# Patient Record
Sex: Female | Born: 1945
Health system: Southern US, Community
[De-identification: ages and names within clinical notes are randomized; demographics above are authoritative.]

## PROBLEM LIST (undated history)

## (undated) DIAGNOSIS — E119 Type 2 diabetes mellitus without complications: Secondary | ICD-10-CM

## (undated) DIAGNOSIS — I2510 Atherosclerotic heart disease of native coronary artery without angina pectoris: Secondary | ICD-10-CM

## (undated) DIAGNOSIS — I10 Essential (primary) hypertension: Secondary | ICD-10-CM

## (undated) DIAGNOSIS — M1990 Unspecified osteoarthritis, unspecified site: Secondary | ICD-10-CM

## (undated) DIAGNOSIS — E785 Hyperlipidemia, unspecified: Secondary | ICD-10-CM

## (undated) DIAGNOSIS — C801 Malignant (primary) neoplasm, unspecified: Secondary | ICD-10-CM

## (undated) DIAGNOSIS — J189 Pneumonia, unspecified organism: Secondary | ICD-10-CM

## (undated) DIAGNOSIS — I219 Acute myocardial infarction, unspecified: Secondary | ICD-10-CM

## (undated) HISTORY — PX: CORONARY ANGIOPLASTY: SHX604

## (undated) HISTORY — DX: Acute myocardial infarction, unspecified: I21.9

## (undated) HISTORY — DX: Malignant (primary) neoplasm, unspecified: C80.1

## (undated) HISTORY — PX: CARDIAC CATHETERIZATION: SHX172

## (undated) HISTORY — DX: Type 2 diabetes mellitus without complications: E11.9

---

## 1967-01-19 HISTORY — PX: WRIST SURGERY: SHX841

## 2005-09-21 ENCOUNTER — Ambulatory Visit (HOSPITAL_COMMUNITY): Admission: RE | Admit: 2005-09-21 | Discharge: 2005-09-22 | Payer: Self-pay | Admitting: Orthopedic Surgery

## 2008-01-31 ENCOUNTER — Ambulatory Visit: Payer: Self-pay | Admitting: Family Medicine

## 2008-05-09 ENCOUNTER — Ambulatory Visit: Payer: Self-pay | Admitting: Nurse Practitioner

## 2009-04-01 ENCOUNTER — Ambulatory Visit: Payer: Self-pay | Admitting: Orthopedic Surgery

## 2009-04-03 ENCOUNTER — Ambulatory Visit: Payer: Self-pay | Admitting: Orthopedic Surgery

## 2009-10-02 ENCOUNTER — Ambulatory Visit: Payer: Self-pay | Admitting: Family Medicine

## 2010-01-18 HISTORY — PX: SHOULDER OPEN ROTATOR CUFF REPAIR: SHX2407

## 2010-10-06 ENCOUNTER — Ambulatory Visit: Payer: Self-pay

## 2010-10-24 ENCOUNTER — Ambulatory Visit: Payer: Self-pay | Admitting: Internal Medicine

## 2011-12-18 ENCOUNTER — Ambulatory Visit: Payer: Self-pay | Admitting: Medical

## 2013-04-12 ENCOUNTER — Ambulatory Visit: Payer: Self-pay

## 2013-04-12 LAB — URINALYSIS, COMPLETE
GLUCOSE, UR: NEGATIVE mg/dL (ref 0–75)
KETONE: NEGATIVE
Leukocyte Esterase: NEGATIVE
Nitrite: NEGATIVE
Ph: 5 (ref 4.5–8.0)
Specific Gravity: 1.025 (ref 1.003–1.030)

## 2013-04-18 DIAGNOSIS — C801 Malignant (primary) neoplasm, unspecified: Secondary | ICD-10-CM

## 2013-04-18 HISTORY — DX: Malignant (primary) neoplasm, unspecified: C80.1

## 2013-05-09 ENCOUNTER — Ambulatory Visit: Payer: Self-pay | Admitting: Family Medicine

## 2013-05-09 LAB — BASIC METABOLIC PANEL
Anion Gap: 6 — ABNORMAL LOW (ref 7–16)
BUN: 16 mg/dL (ref 7–18)
CO2: 30 mmol/L (ref 21–32)
Calcium, Total: 9.4 mg/dL (ref 8.5–10.1)
Chloride: 102 mmol/L (ref 98–107)
Creatinine: 1.02 mg/dL (ref 0.60–1.30)
GFR CALC NON AF AMER: 56 — AB
Glucose: 100 mg/dL — ABNORMAL HIGH (ref 65–99)
OSMOLALITY: 277 (ref 275–301)
Potassium: 4.6 mmol/L (ref 3.5–5.1)
Sodium: 138 mmol/L (ref 136–145)

## 2013-05-10 ENCOUNTER — Ambulatory Visit: Payer: Self-pay | Admitting: Family Medicine

## 2013-05-11 ENCOUNTER — Ambulatory Visit: Payer: Self-pay | Admitting: Family Medicine

## 2013-05-14 LAB — PATHOLOGY REPORT

## 2013-05-18 ENCOUNTER — Ambulatory Visit: Payer: Self-pay | Admitting: Oncology

## 2013-05-18 ENCOUNTER — Ambulatory Visit: Payer: Self-pay | Admitting: Family Medicine

## 2013-05-18 LAB — CBC CANCER CENTER
Basophil #: 0.1 x10 3/mm (ref 0.0–0.1)
Basophil %: 1.3 %
EOS ABS: 0.1 x10 3/mm (ref 0.0–0.7)
Eosinophil %: 1.4 %
HCT: 42.4 % (ref 35.0–47.0)
HGB: 14.1 g/dL (ref 12.0–16.0)
LYMPHS PCT: 16.3 %
Lymphocyte #: 1.5 x10 3/mm (ref 1.0–3.6)
MCH: 27.6 pg (ref 26.0–34.0)
MCHC: 33.2 g/dL (ref 32.0–36.0)
MCV: 83 fL (ref 80–100)
Monocyte #: 0.6 x10 3/mm (ref 0.2–0.9)
Monocyte %: 6.4 %
NEUTROS ABS: 6.7 x10 3/mm — AB (ref 1.4–6.5)
NEUTROS PCT: 74.6 %
PLATELETS: 234 x10 3/mm (ref 150–440)
RBC: 5.11 10*6/uL (ref 3.80–5.20)
RDW: 14.5 % (ref 11.5–14.5)
WBC: 9 x10 3/mm (ref 3.6–11.0)

## 2013-05-18 LAB — COMPREHENSIVE METABOLIC PANEL
ALBUMIN: 3.6 g/dL (ref 3.4–5.0)
ALK PHOS: 104 U/L
Anion Gap: 7 (ref 7–16)
BILIRUBIN TOTAL: 0.5 mg/dL (ref 0.2–1.0)
BUN: 17 mg/dL (ref 7–18)
CALCIUM: 9.7 mg/dL (ref 8.5–10.1)
CO2: 29 mmol/L (ref 21–32)
Chloride: 102 mmol/L (ref 98–107)
Creatinine: 1.05 mg/dL (ref 0.60–1.30)
EGFR (Non-African Amer.): 54 — ABNORMAL LOW
GLUCOSE: 105 mg/dL — AB (ref 65–99)
OSMOLALITY: 278 (ref 275–301)
POTASSIUM: 4.2 mmol/L (ref 3.5–5.1)
SGOT(AST): 24 U/L (ref 15–37)
SGPT (ALT): 46 U/L (ref 12–78)
Sodium: 138 mmol/L (ref 136–145)
Total Protein: 8.2 g/dL (ref 6.4–8.2)

## 2013-05-22 LAB — CANCER ANTIGEN 27.29: CA 27.29: 60.3 U/mL — AB (ref 0.0–38.6)

## 2013-06-04 ENCOUNTER — Encounter: Payer: Self-pay | Admitting: General Surgery

## 2013-06-04 ENCOUNTER — Ambulatory Visit (INDEPENDENT_AMBULATORY_CARE_PROVIDER_SITE_OTHER): Payer: Medicare HMO | Admitting: General Surgery

## 2013-06-04 VITALS — BP 180/84 | HR 88 | Resp 16 | Ht 67.0 in | Wt 232.0 lb

## 2013-06-04 DIAGNOSIS — C50919 Malignant neoplasm of unspecified site of unspecified female breast: Secondary | ICD-10-CM

## 2013-06-04 NOTE — Progress Notes (Signed)
Patient ID: Faith [name], female   DOB: 02-24-1945, 68 y.o.   MRN: [id number]  Chief Complaint  Patient presents with  . Other    breast cancer    HPI [name] is a 68 y.o. female who presents for an evaluation of a port placement. The patient was diagnosed in April 2015 with left breast cancer.   HPI  Past Medical History  Diagnosis Date  . Cancer     left breast    Past Surgical History  Procedure Laterality Date  . Shoulder open rotator cuff repair  2012  . Wrist surgery Left 1969    cyst    Family History  Problem Relation Age of Onset  . Adopted: Yes    Social History History  Substance Use Topics  . Smoking status: Former Smoker -- 1.00 packs/day for 15 years    Types: Cigarettes  . Smokeless tobacco: Never Used  . Alcohol Use: Yes    Allergies  Allergen Reactions  . Shellfish Allergy Nausea And Vomiting    Current Outpatient Prescriptions  Medication Sig Dispense Refill  . Calcium Carbonate-Vitamin D (CALCIUM + D PO) Take 1 tablet by mouth daily.       No current facility-administered medications for this visit.    Review of Systems Review of Systems  Constitutional: Negative.   Respiratory: Negative.   Cardiovascular: Negative.     Blood pressure 180/84, pulse 88, resp. rate 16, height 5' 7" (1.702 m), weight 232 lb (105.235 kg).  Physical Exam Physical Exam  Constitutional: She is oriented to person, place, and time. She appears well-developed and well-nourished.  Eyes: Conjunctivae are normal. No scleral icterus.  Neck: Neck supple. No thyromegaly present.  Cardiovascular: Normal rate, regular rhythm and normal heart sounds.   No murmur heard. Pulmonary/Chest: Effort normal and breath sounds normal.  Lymphadenopathy:    She has no cervical adenopathy.  Neurological: She is alert and oriented to person, place, and time.  Skin: Skin is warm and dry.  Extremities moderate lymphedema in left arm.   Data Reviewed  Dr. Metro Kung  notes.   Assessment    Locally advanced left breast cancer with axillary nodes. Lymphedema and possible lung met.     Plan    Port placement fully discussed with patient and she is agreeable.      Patient has been scheduled for surgery at [facility] on 06/07/13. patient is aware of date and instructions.     Elodie Panameno G Nathifa Ritthaler 06/04/2013, 1:07 PM

## 2013-06-04 NOTE — Patient Instructions (Addendum)
Patient to be scheduled for port placement. The patient is aware to call back for any questions or concerns.  Patient has been scheduled for surgery at [facility] on 06/07/13. patient is aware of date and instructions.

## 2013-06-06 ENCOUNTER — Ambulatory Visit: Payer: Self-pay | Admitting: Anesthesiology

## 2013-06-06 DIAGNOSIS — Z01810 Encounter for preprocedural cardiovascular examination: Secondary | ICD-10-CM

## 2013-06-06 DIAGNOSIS — I10 Essential (primary) hypertension: Secondary | ICD-10-CM

## 2013-06-07 ENCOUNTER — Encounter: Payer: Self-pay | Admitting: General Surgery

## 2013-06-07 ENCOUNTER — Ambulatory Visit: Payer: Self-pay | Admitting: General Surgery

## 2013-06-07 DIAGNOSIS — C50919 Malignant neoplasm of unspecified site of unspecified female breast: Secondary | ICD-10-CM

## 2013-06-07 HISTORY — PX: PORTACATH PLACEMENT: SHX2246

## 2013-06-08 LAB — COMPREHENSIVE METABOLIC PANEL
Albumin: 3.4 g/dL (ref 3.4–5.0)
Alkaline Phosphatase: 99 U/L
Anion Gap: 8 (ref 7–16)
BUN: 20 mg/dL — AB (ref 7–18)
Bilirubin,Total: 0.5 mg/dL (ref 0.2–1.0)
Calcium, Total: 9.6 mg/dL (ref 8.5–10.1)
Chloride: 104 mmol/L (ref 98–107)
Co2: 28 mmol/L (ref 21–32)
Creatinine: 0.94 mg/dL (ref 0.60–1.30)
EGFR (African American): >60
GLUCOSE: 104 mg/dL — AB (ref 65–99)
OSMOLALITY: 282 (ref 275–301)
Potassium: 4.1 mmol/L (ref 3.5–5.1)
SGOT(AST): 17 U/L (ref 15–37)
SGPT (ALT): 33 U/L (ref 12–78)
SODIUM: 140 mmol/L (ref 136–145)
TOTAL PROTEIN: 7.9 g/dL (ref 6.4–8.2)

## 2013-06-08 LAB — CBC CANCER CENTER
Basophil #: 0.1 x10 3/mm (ref 0.0–0.1)
Basophil %: 0.8 %
Eosinophil #: 0.1 x10 3/mm (ref 0.0–0.7)
Eosinophil %: 1.4 %
HCT: 41.4 % (ref 35.0–47.0)
HGB: 13.9 g/dL (ref 12.0–16.0)
Lymphocyte #: 1.5 x10 3/mm (ref 1.0–3.6)
Lymphocyte %: 17.2 %
MCH: 28.2 pg (ref 26.0–34.0)
MCHC: 33.6 g/dL (ref 32.0–36.0)
MCV: 84 fL (ref 80–100)
Monocyte #: 0.6 x10 3/mm (ref 0.2–0.9)
Monocyte %: 6.6 %
Neutrophil #: 6.4 x10 3/mm (ref 1.4–6.5)
Neutrophil %: 74 %
Platelet: 285 x10 3/mm (ref 150–440)
RBC: 4.94 10*6/uL (ref 3.80–5.20)
RDW: 14.7 % — ABNORMAL HIGH (ref 11.5–14.5)
WBC: 8.7 x10 3/mm (ref 3.6–11.0)

## 2013-06-14 ENCOUNTER — Encounter: Payer: Self-pay | Admitting: General Surgery

## 2013-06-14 ENCOUNTER — Ambulatory Visit (INDEPENDENT_AMBULATORY_CARE_PROVIDER_SITE_OTHER): Payer: Self-pay | Admitting: General Surgery

## 2013-06-14 VITALS — BP 164/80 | HR 88 | Resp 14 | Ht 67.0 in | Wt 224.0 lb

## 2013-06-14 DIAGNOSIS — C50919 Malignant neoplasm of unspecified site of unspecified female breast: Secondary | ICD-10-CM

## 2013-06-14 NOTE — Patient Instructions (Signed)
The patient is aware to call back for any questions or concerns.  

## 2013-06-14 NOTE — Progress Notes (Signed)
Here today for postoperative visit port placement 06-07-13. Chemotherapy started 06-08-13. States she is a little nauseated and having some loose stools from the chemotherapy.  Port site incision clean and healing well. Lungs clear. Follow up as needed.

## 2013-06-15 LAB — CBC CANCER CENTER
Basophil #: 0 x10 3/mm (ref 0.0–0.1)
Basophil %: 0.1 %
Eosinophil #: 0.1 x10 3/mm (ref 0.0–0.7)
Eosinophil %: 1.5 %
HCT: 38.7 % (ref 35.0–47.0)
HGB: 12.9 g/dL (ref 12.0–16.0)
LYMPHS PCT: 26.6 %
Lymphocyte #: 1.2 x10 3/mm (ref 1.0–3.6)
MCH: 28.3 pg (ref 26.0–34.0)
MCHC: 33.3 g/dL (ref 32.0–36.0)
MCV: 85 fL (ref 80–100)
MONO ABS: 0.1 x10 3/mm — AB (ref 0.2–0.9)
Monocyte %: 2.7 %
NEUTROS PCT: 69.1 %
Neutrophil #: 3.2 x10 3/mm (ref 1.4–6.5)
Platelet: 291 x10 3/mm (ref 150–440)
RBC: 4.56 10*6/uL (ref 3.80–5.20)
RDW: 14.5 % (ref 11.5–14.5)
WBC: 4.7 x10 3/mm (ref 3.6–11.0)

## 2013-06-18 ENCOUNTER — Ambulatory Visit: Payer: Self-pay | Admitting: Oncology

## 2013-06-29 LAB — COMPREHENSIVE METABOLIC PANEL
ALBUMIN: 3.3 g/dL — AB (ref 3.4–5.0)
ALK PHOS: 112 U/L
ALT: 42 U/L (ref 12–78)
AST: 19 U/L (ref 15–37)
Anion Gap: 8 (ref 7–16)
BILIRUBIN TOTAL: 0.3 mg/dL (ref 0.2–1.0)
BUN: 18 mg/dL (ref 7–18)
CALCIUM: 9.4 mg/dL (ref 8.5–10.1)
Chloride: 103 mmol/L (ref 98–107)
Co2: 29 mmol/L (ref 21–32)
Creatinine: 1.07 mg/dL (ref 0.60–1.30)
EGFR (African American): >60
EGFR (Non-African Amer.): 53 — ABNORMAL LOW
Glucose: 102 mg/dL — ABNORMAL HIGH (ref 65–99)
Osmolality: 281 (ref 275–301)
POTASSIUM: 3.8 mmol/L (ref 3.5–5.1)
Sodium: 140 mmol/L (ref 136–145)
Total Protein: 7.6 g/dL (ref 6.4–8.2)

## 2013-06-29 LAB — CBC CANCER CENTER
Basophil #: 0.1 x10 3/mm (ref 0.0–0.1)
Basophil %: 1.3 %
EOS ABS: 0 x10 3/mm (ref 0.0–0.7)
Eosinophil %: 0.3 %
HCT: 38.2 % (ref 35.0–47.0)
HGB: 12.8 g/dL (ref 12.0–16.0)
Lymphocyte #: 1.6 x10 3/mm (ref 1.0–3.6)
Lymphocyte %: 14.5 %
MCH: 28 pg (ref 26.0–34.0)
MCHC: 33.5 g/dL (ref 32.0–36.0)
MCV: 84 fL (ref 80–100)
Monocyte #: 0.8 x10 3/mm (ref 0.2–0.9)
Monocyte %: 7.3 %
Neutrophil #: 8.7 x10 3/mm — ABNORMAL HIGH (ref 1.4–6.5)
Neutrophil %: 76.6 %
Platelet: 227 x10 3/mm (ref 150–440)
RBC: 4.56 10*6/uL (ref 3.80–5.20)
RDW: 14.4 % (ref 11.5–14.5)
WBC: 11.3 x10 3/mm — ABNORMAL HIGH (ref 3.6–11.0)

## 2013-07-18 ENCOUNTER — Ambulatory Visit: Payer: Self-pay | Admitting: Oncology

## 2013-07-19 LAB — CBC CANCER CENTER
BASOS ABS: 0.1 x10 3/mm (ref 0.0–0.1)
Basophil %: 0.7 %
EOS ABS: 0 x10 3/mm (ref 0.0–0.7)
EOS PCT: 0.2 %
HCT: 37.5 % (ref 35.0–47.0)
HGB: 12.7 g/dL (ref 12.0–16.0)
LYMPHS PCT: 14.9 %
Lymphocyte #: 1.4 x10 3/mm (ref 1.0–3.6)
MCH: 28.6 pg (ref 26.0–34.0)
MCHC: 33.9 g/dL (ref 32.0–36.0)
MCV: 84 fL (ref 80–100)
MONO ABS: 0.6 x10 3/mm (ref 0.2–0.9)
Monocyte %: 6.5 %
Neutrophil #: 7.4 x10 3/mm — ABNORMAL HIGH (ref 1.4–6.5)
Neutrophil %: 77.7 %
PLATELETS: 241 x10 3/mm (ref 150–440)
RBC: 4.46 10*6/uL (ref 3.80–5.20)
RDW: 15.6 % — AB (ref 11.5–14.5)
WBC: 9.5 x10 3/mm (ref 3.6–11.0)

## 2013-07-19 LAB — COMPREHENSIVE METABOLIC PANEL
ALBUMIN: 3.3 g/dL — AB (ref 3.4–5.0)
ALT: 54 U/L (ref 12–78)
ANION GAP: 10 (ref 7–16)
Alkaline Phosphatase: 116 U/L
BILIRUBIN TOTAL: 0.3 mg/dL (ref 0.2–1.0)
BUN: 15 mg/dL (ref 7–18)
CALCIUM: 9.7 mg/dL (ref 8.5–10.1)
CHLORIDE: 105 mmol/L (ref 98–107)
CO2: 27 mmol/L (ref 21–32)
CREATININE: 0.97 mg/dL (ref 0.60–1.30)
EGFR (Non-African Amer.): 60 — ABNORMAL LOW
Glucose: 103 mg/dL — ABNORMAL HIGH (ref 65–99)
OSMOLALITY: 284 (ref 275–301)
POTASSIUM: 3.7 mmol/L (ref 3.5–5.1)
SGOT(AST): 24 U/L (ref 15–37)
SODIUM: 142 mmol/L (ref 136–145)
TOTAL PROTEIN: 7.7 g/dL (ref 6.4–8.2)

## 2013-08-09 LAB — CBC CANCER CENTER
BASOS ABS: 0.1 x10 3/mm (ref 0.0–0.1)
BASOS PCT: 0.6 %
EOS ABS: 0 x10 3/mm (ref 0.0–0.7)
EOS PCT: 0.1 %
HCT: 35.9 % (ref 35.0–47.0)
HGB: 12 g/dL (ref 12.0–16.0)
LYMPHS ABS: 1.5 x10 3/mm (ref 1.0–3.6)
LYMPHS PCT: 15.3 %
MCH: 28.6 pg (ref 26.0–34.0)
MCHC: 33.4 g/dL (ref 32.0–36.0)
MCV: 86 fL (ref 80–100)
Monocyte #: 0.8 x10 3/mm (ref 0.2–0.9)
Monocyte %: 7.9 %
NEUTROS PCT: 76.1 %
Neutrophil #: 7.7 x10 3/mm — ABNORMAL HIGH (ref 1.4–6.5)
PLATELETS: 226 x10 3/mm (ref 150–440)
RBC: 4.19 10*6/uL (ref 3.80–5.20)
RDW: 15.9 % — ABNORMAL HIGH (ref 11.5–14.5)
WBC: 10.1 x10 3/mm (ref 3.6–11.0)

## 2013-08-09 LAB — COMPREHENSIVE METABOLIC PANEL
ALT: 47 U/L
ANION GAP: 11 (ref 7–16)
Albumin: 3.2 g/dL — ABNORMAL LOW (ref 3.4–5.0)
Alkaline Phosphatase: 115 U/L
BUN: 15 mg/dL (ref 7–18)
Bilirubin,Total: 0.3 mg/dL (ref 0.2–1.0)
CALCIUM: 9.7 mg/dL (ref 8.5–10.1)
CHLORIDE: 104 mmol/L (ref 98–107)
Co2: 26 mmol/L (ref 21–32)
Creatinine: 1.16 mg/dL (ref 0.60–1.30)
EGFR (Non-African Amer.): 48 — ABNORMAL LOW
GFR CALC AF AMER: 56 — AB
Glucose: 100 mg/dL — ABNORMAL HIGH (ref 65–99)
Osmolality: 282 (ref 275–301)
POTASSIUM: 3.5 mmol/L (ref 3.5–5.1)
SGOT(AST): 25 U/L (ref 15–37)
Sodium: 141 mmol/L (ref 136–145)
Total Protein: 7.5 g/dL (ref 6.4–8.2)

## 2013-08-18 ENCOUNTER — Ambulatory Visit: Payer: Self-pay | Admitting: Oncology

## 2013-08-31 LAB — COMPREHENSIVE METABOLIC PANEL
ALK PHOS: 113 U/L
ANION GAP: 9 (ref 7–16)
Albumin: 3.2 g/dL — ABNORMAL LOW (ref 3.4–5.0)
BUN: 20 mg/dL — ABNORMAL HIGH (ref 7–18)
Bilirubin,Total: 0.3 mg/dL (ref 0.2–1.0)
CHLORIDE: 102 mmol/L (ref 98–107)
CREATININE: 1.03 mg/dL (ref 0.60–1.30)
Calcium, Total: 9.5 mg/dL (ref 8.5–10.1)
Co2: 28 mmol/L (ref 21–32)
GFR CALC NON AF AMER: 55 — AB
GLUCOSE: 100 mg/dL — AB (ref 65–99)
OSMOLALITY: 280 (ref 275–301)
Potassium: 3.9 mmol/L (ref 3.5–5.1)
SGOT(AST): 22 U/L (ref 15–37)
SGPT (ALT): 39 U/L
SODIUM: 139 mmol/L (ref 136–145)
TOTAL PROTEIN: 7.5 g/dL (ref 6.4–8.2)

## 2013-08-31 LAB — CBC CANCER CENTER
BASOS ABS: 0.1 x10 3/mm (ref 0.0–0.1)
BASOS PCT: 0.6 %
Eosinophil #: 0 x10 3/mm (ref 0.0–0.7)
Eosinophil %: 0.2 %
HCT: 33.4 % — AB (ref 35.0–47.0)
HGB: 11.4 g/dL — ABNORMAL LOW (ref 12.0–16.0)
LYMPHS PCT: 17 %
Lymphocyte #: 1.6 x10 3/mm (ref 1.0–3.6)
MCH: 29.5 pg (ref 26.0–34.0)
MCHC: 34.2 g/dL (ref 32.0–36.0)
MCV: 86 fL (ref 80–100)
MONOS PCT: 6.8 %
Monocyte #: 0.7 x10 3/mm (ref 0.2–0.9)
NEUTROS ABS: 7.3 x10 3/mm — AB (ref 1.4–6.5)
Neutrophil %: 75.4 %
PLATELETS: 190 x10 3/mm (ref 150–440)
RBC: 3.88 10*6/uL (ref 3.80–5.20)
RDW: 17 % — ABNORMAL HIGH (ref 11.5–14.5)
WBC: 9.6 x10 3/mm (ref 3.6–11.0)

## 2013-09-18 ENCOUNTER — Ambulatory Visit: Payer: Self-pay | Admitting: Oncology

## 2013-09-21 LAB — COMPREHENSIVE METABOLIC PANEL WITH GFR
Albumin: 3.2 g/dL — ABNORMAL LOW (ref 3.4–5.0)
Alkaline Phosphatase: 110 U/L
Anion Gap: 8 (ref 7–16)
BUN: 15 mg/dL (ref 7–18)
Bilirubin,Total: 0.3 mg/dL (ref 0.2–1.0)
Calcium, Total: 9.3 mg/dL (ref 8.5–10.1)
Chloride: 103 mmol/L (ref 98–107)
Co2: 29 mmol/L (ref 21–32)
Creatinine: 0.99 mg/dL (ref 0.60–1.30)
EGFR (African American): >60
EGFR (Non-African Amer.): 58 — ABNORMAL LOW
Glucose: 102 mg/dL — ABNORMAL HIGH (ref 65–99)
Osmolality: 280 (ref 275–301)
Potassium: 3.8 mmol/L (ref 3.5–5.1)
SGOT(AST): 19 U/L (ref 15–37)
SGPT (ALT): 40 U/L
Sodium: 140 mmol/L (ref 136–145)
Total Protein: 7 g/dL (ref 6.4–8.2)

## 2013-09-21 LAB — CBC CANCER CENTER
Basophil #: 0 x10 3/mm (ref 0.0–0.1)
Basophil %: 0.6 %
Eosinophil #: 0 x10 3/mm (ref 0.0–0.7)
Eosinophil %: 0.2 %
HCT: 32.1 % — ABNORMAL LOW (ref 35.0–47.0)
HGB: 10.8 g/dL — ABNORMAL LOW (ref 12.0–16.0)
Lymphocyte %: 19.6 %
Lymphs Abs: 1.1 x10 3/mm (ref 1.0–3.6)
MCH: 30.1 pg (ref 26.0–34.0)
MCHC: 33.7 g/dL (ref 32.0–36.0)
MCV: 89 fL (ref 80–100)
Monocyte #: 0.7 x10 3/mm (ref 0.2–0.9)
Monocyte %: 12.6 %
Neutrophil #: 3.7 x10 3/mm (ref 1.4–6.5)
Neutrophil %: 67 %
Platelet: 298 x10 3/mm (ref 150–440)
RBC: 3.6 x10 6/mm — ABNORMAL LOW (ref 3.80–5.20)
RDW: 18.5 % — ABNORMAL HIGH (ref 11.5–14.5)
WBC: 5.5 x10 3/mm (ref 3.6–11.0)

## 2013-09-21 LAB — MAGNESIUM: Magnesium: 1.7 mg/dL — ABNORMAL LOW

## 2013-10-18 ENCOUNTER — Ambulatory Visit: Payer: Self-pay | Admitting: Oncology

## 2013-10-19 LAB — CBC CANCER CENTER
BASOS PCT: 0.9 %
Basophil #: 0.1 x10 3/mm (ref 0.0–0.1)
EOS PCT: 0.6 %
Eosinophil #: 0 x10 3/mm (ref 0.0–0.7)
HCT: 32.1 % — ABNORMAL LOW (ref 35.0–47.0)
HGB: 10.9 g/dL — ABNORMAL LOW (ref 12.0–16.0)
Lymphocyte #: 0.8 x10 3/mm — ABNORMAL LOW (ref 1.0–3.6)
Lymphocyte %: 13 %
MCH: 31.7 pg (ref 26.0–34.0)
MCHC: 34.1 g/dL (ref 32.0–36.0)
MCV: 93 fL (ref 80–100)
MONO ABS: 0.7 x10 3/mm (ref 0.2–0.9)
MONOS PCT: 10.7 %
NEUTROS PCT: 74.8 %
Neutrophil #: 4.8 x10 3/mm (ref 1.4–6.5)
Platelet: 262 x10 3/mm (ref 150–440)
RBC: 3.46 10*6/uL — ABNORMAL LOW (ref 3.80–5.20)
RDW: 17 % — ABNORMAL HIGH (ref 11.5–14.5)
WBC: 6.4 x10 3/mm (ref 3.6–11.0)

## 2013-10-19 LAB — BASIC METABOLIC PANEL
ANION GAP: 7 (ref 7–16)
BUN: 15 mg/dL (ref 7–18)
CALCIUM: 9.1 mg/dL (ref 8.5–10.1)
CREATININE: 1.17 mg/dL (ref 0.60–1.30)
Chloride: 103 mmol/L (ref 98–107)
Co2: 29 mmol/L (ref 21–32)
EGFR (African American): 59 — ABNORMAL LOW
EGFR (Non-African Amer.): 49 — ABNORMAL LOW
GLUCOSE: 110 mg/dL — AB (ref 65–99)
OSMOLALITY: 279 (ref 275–301)
POTASSIUM: 3.7 mmol/L (ref 3.5–5.1)
SODIUM: 139 mmol/L (ref 136–145)

## 2013-10-19 LAB — MAGNESIUM: Magnesium: 2.1 mg/dL

## 2013-10-25 LAB — CBC CANCER CENTER
BASOS ABS: 0 x10 3/mm (ref 0.0–0.1)
Basophil %: 1.4 %
EOS ABS: 0 x10 3/mm (ref 0.0–0.7)
EOS PCT: 2.7 %
HCT: 33 % — ABNORMAL LOW (ref 35.0–47.0)
HGB: 11.1 g/dL — AB (ref 12.0–16.0)
LYMPHS ABS: 0.5 x10 3/mm — AB (ref 1.0–3.6)
Lymphocyte %: 30.7 %
MCH: 31.5 pg (ref 26.0–34.0)
MCHC: 33.8 g/dL (ref 32.0–36.0)
MCV: 93 fL (ref 80–100)
Monocyte #: 0.1 x10 3/mm — ABNORMAL LOW (ref 0.2–0.9)
Monocyte %: 3.3 %
NEUTROS PCT: 61.9 %
Neutrophil #: 1 x10 3/mm — ABNORMAL LOW (ref 1.4–6.5)
PLATELETS: 124 x10 3/mm — AB (ref 150–440)
RBC: 3.54 10*6/uL — ABNORMAL LOW (ref 3.80–5.20)
RDW: 16.3 % — ABNORMAL HIGH (ref 11.5–14.5)
WBC: 1.6 x10 3/mm — CL (ref 3.6–11.0)

## 2013-11-02 LAB — CBC CANCER CENTER
BASOS ABS: 0.1 x10 3/mm (ref 0.0–0.1)
Basophil %: 0.9 %
EOS ABS: 0 x10 3/mm (ref 0.0–0.7)
Eosinophil %: 0.6 %
HCT: 32.9 % — AB (ref 35.0–47.0)
HGB: 11.2 g/dL — AB (ref 12.0–16.0)
LYMPHS PCT: 16.1 %
Lymphocyte #: 1.1 x10 3/mm (ref 1.0–3.6)
MCH: 31.3 pg (ref 26.0–34.0)
MCHC: 33.9 g/dL (ref 32.0–36.0)
MCV: 93 fL (ref 80–100)
MONO ABS: 0.9 x10 3/mm (ref 0.2–0.9)
MONOS PCT: 12.5 %
NEUTROS ABS: 4.7 x10 3/mm (ref 1.4–6.5)
NEUTROS PCT: 69.9 %
Platelet: 256 x10 3/mm (ref 150–440)
RBC: 3.56 10*6/uL — ABNORMAL LOW (ref 3.80–5.20)
RDW: 16.1 % — AB (ref 11.5–14.5)
WBC: 6.8 x10 3/mm (ref 3.6–11.0)

## 2013-11-05 ENCOUNTER — Encounter: Payer: Self-pay | Admitting: General Surgery

## 2013-11-05 ENCOUNTER — Ambulatory Visit (INDEPENDENT_AMBULATORY_CARE_PROVIDER_SITE_OTHER): Payer: Medicare HMO | Admitting: General Surgery

## 2013-11-05 VITALS — BP 184/84 | HR 74 | Resp 14 | Ht 67.0 in | Wt 217.0 lb

## 2013-11-05 DIAGNOSIS — C50912 Malignant neoplasm of unspecified site of left female breast: Secondary | ICD-10-CM

## 2013-11-05 NOTE — Patient Instructions (Addendum)
Mastectomy, With or Without Reconstruction °Mastectomy (removal of the breast) is a procedure most commonly used to treat cancer (tumor) of the breast. Different procedures are available for treatment. This depends on the stage of the tumor (abnormal growths). Discuss this with your caregiver, surgeon (a specialist for performing operations such as this), or oncologist (someone specialized in the treatment of cancer). With proper information, you can decide which treatment is best for you. Although the sound of the word cancer is frightening to all of us, the new treatments and medications can be a source of reassurance and comfort. If there are things you are worried about, discuss them with your caregiver. He or she can help comfort you and your family. Some of the different procedures for treating breast cancer are: °· Radical (extensive) mastectomy. This is an operation used to remove the entire breast, the muscles under the breast, and all of the glands (lymph nodes) under the arm. With all of the new treatments available for cancer of the breast, this procedure has become less common. °· Modified radical mastectomy. This is a similar operation to the radical mastectomy described above. In the modified radical mastectomy, the muscles of the chest wall are not removed unless one of the lessor muscles is removed. One of the lessor muscles may be removed to allow better removal of the lymph nodes. The axillary lymph nodes are also removed. Rarely, during an axillary node dissection nerves to this area are damaged. Radiation therapy is then often used to the area following this surgery. °· A total mastectomy also known as a complete or simple mastectomy. It involves removal of only the breast. The lymph nodes and the muscles are left in place. °· In a lumpectomy, the lump is removed from the breast. This is the simplest form of surgical treatment. A sentinel lymph node biopsy may also be done. Additional treatment  may be required. °RISKS AND COMPLICATIONS °The main problems that follow removal of the breast include: °· Infection (germs start growing in the wound). This can usually be treated with antibiotics (medications that kill germs). °· Lymphedema. This means the arm on the side of the breast that was operated on swells because the lymph (tissue fluid) cannot follow the main channels back into the body. This only occurs when the lymph nodes have had to be removed under the arm. °· There may be some areas of numbness to the upper arm and around the incision (cut by the surgeon) in the breast. This happens because of the cutting of or damage to some of the nerves in the area. This is most often unavoidable. °· There may be difficulty moving the arm in a full range of motion (moving in all directions) following surgery. This usually improves with time following use and exercise. °· Recurrence of breast cancer may happen with the very best of surgery and follow up treatment. Sometimes small cancer cells that cannot be seen with the naked eye have already spread at the time of surgery. When this happens other treatment is available. This treatment may be radiation, medications or a combination of both. °RECONSTRUCTION °Reconstruction of the breast may be done immediately if there is not going to be post-operative radiation. This surgery is done for cosmetic (improve appearance) purposes to improve the physical appearance after the operation. This may be done in two ways: °· It can be done using a saline filled prosthetic (an artificial breast which is filled with salt water). Silicone breast implants are now   re-approved by the FDA and are being commonly used.  Reconstruction can be done using the body's own muscle/fat/skin. Your caregiver will discuss your options with you. Depending upon your needs or choice, together you will be able to determine which procedure is best for you. Document Released: 09/29/2000 Document  Revised: 09/29/2011 Document Reviewed: 05/23/2007 [facility] Patient Information 2015 [place], [place]. This information is not intended to replace advice given to you by your health care provider. Make sure you discuss any questions you have with your health care provider.  Follow up appointment to be announced.

## 2013-11-05 NOTE — Progress Notes (Signed)
Patient ID: Faith [name], female   DOB: 1945-05-13, 68 y.o.   MRN: [id number]  Chief Complaint  Patient presents with  . Other    discussion    HPI Faith [name] is a 68 y.o. female here today to discuss mastectomy. Pt with locally advanced left breast cancer and left arm lymphedema, diagnosed in Apr of this yr.   Patient finished chemo three  weeks ago.  HPI  Past Medical History  Diagnosis Date  . Cancer     left breast    Past Surgical History  Procedure Laterality Date  . Shoulder open rotator cuff repair  2012  . Wrist surgery Left 1969    cyst  . Portacath placement  06-07-13    Family History  Problem Relation Age of Onset  . Adopted: Yes    Social History History  Substance Use Topics  . Smoking status: Former Smoker -- 1.00 packs/day for 15 years    Types: Cigarettes  . Smokeless tobacco: Never Used  . Alcohol Use: Yes    Allergies  Allergen Reactions  . Shellfish Allergy Nausea And Vomiting    Current Outpatient Prescriptions  Medication Sig Dispense Refill  . Calcium Carbonate-Vitamin D (CALCIUM + D PO) Take 1 tablet by mouth daily.      . Cholecalciferol (VITAMIN D PO) Take 5,000 Units by mouth daily.      . ondansetron (ZOFRAN-ODT) 8 MG disintegrating tablet        No current facility-administered medications for this visit.    Review of Systems Review of Systems  Constitutional: Negative.   Respiratory: Negative.   Cardiovascular: Negative.     Blood pressure 184/84, pulse 74, resp. rate 14, height _0  (1.702 m), weight 217 lb (98.431 kg).  Physical Exam Physical Exam  Constitutional: She is oriented to person, place, and time. She appears well-developed and well-nourished.  Eyes: Conjunctivae are normal. No scleral icterus.  Neck: Neck supple.  Cardiovascular: Normal rate, regular rhythm and normal heart sounds.   Pulmonary/Chest: Effort normal and breath sounds normal. Right breast exhibits inverted nipple ( this is new and more  proinent lst 2 mos). Right breast exhibits no mass, no nipple discharge, no skin change and no tenderness. Left breast exhibits inverted nipple and skin change ( contracted in center of breast ). Left breast exhibits no mass, no nipple discharge and no tenderness.  Abdominal: Soft. Normal appearance and bowel sounds are normal. There is no hepatomegaly. There is no tenderness.  Lymphadenopathy:    She has no cervical adenopathy.    She has no axillary adenopathy.  moderate left arm lymphedema. Better now since initial diagnosis  Neurological: She is alert and oriented to person, place, and time.  Skin: Skin is warm.    Data Reviewed Prior notes  Assessment    CA left breast    Plan    Discussed mastectomy. Await CT scan to be done. Will discuss with Dr. Oliva Bustard regarding surgery       [name] 11/05/2013, 1:11 PM

## 2013-11-08 ENCOUNTER — Telehealth: Payer: Self-pay | Admitting: *Deleted

## 2013-11-08 NOTE — Telephone Encounter (Signed)
Patient's surgery has been scheduled for 11-19-13 at [facility].   This patient expressed that she would like to have a bilateral mastectomy not just a left mastectomy. Dr. Jamal Collin notified of this. Dr. Jamal Collin will need to speak with Dr. Oliva Bustard regarding this before proceeding.   Patient scheduled for a pre-op visit next Tuesday, 11-13-13.

## 2013-11-09 LAB — CBC CANCER CENTER
BASOS PCT: 1.1 %
Basophil #: 0.1 x10 3/mm (ref 0.0–0.1)
EOS PCT: 0.3 %
Eosinophil #: 0 x10 3/mm (ref 0.0–0.7)
HCT: 34.2 % — ABNORMAL LOW (ref 35.0–47.0)
HGB: 11.5 g/dL — AB (ref 12.0–16.0)
LYMPHS PCT: 17.1 %
Lymphocyte #: 0.9 x10 3/mm — ABNORMAL LOW (ref 1.0–3.6)
MCH: 31.4 pg (ref 26.0–34.0)
MCHC: 33.7 g/dL (ref 32.0–36.0)
MCV: 93 fL (ref 80–100)
MONO ABS: 1 x10 3/mm — AB (ref 0.2–0.9)
Monocyte %: 19.5 %
Neutrophil #: 3.2 x10 3/mm (ref 1.4–6.5)
Neutrophil %: 62 %
Platelet: 268 x10 3/mm (ref 150–440)
RBC: 3.66 10*6/uL — ABNORMAL LOW (ref 3.80–5.20)
RDW: 15.9 % — ABNORMAL HIGH (ref 11.5–14.5)
WBC: 5.2 x10 3/mm (ref 3.6–11.0)

## 2013-11-12 ENCOUNTER — Other Ambulatory Visit: Payer: Self-pay | Admitting: General Surgery

## 2013-11-12 DIAGNOSIS — C50912 Malignant neoplasm of unspecified site of left female breast: Secondary | ICD-10-CM

## 2013-11-13 ENCOUNTER — Encounter: Payer: Self-pay | Admitting: General Surgery

## 2013-11-13 ENCOUNTER — Ambulatory Visit: Payer: Self-pay | Admitting: General Surgery

## 2013-11-13 ENCOUNTER — Ambulatory Visit (INDEPENDENT_AMBULATORY_CARE_PROVIDER_SITE_OTHER): Payer: Medicare HMO | Admitting: General Surgery

## 2013-11-13 VITALS — BP 178/80 | HR 88 | Resp 16 | Ht 67.0 in | Wt 215.0 lb

## 2013-11-13 DIAGNOSIS — C50912 Malignant neoplasm of unspecified site of left female breast: Secondary | ICD-10-CM

## 2013-11-13 NOTE — Progress Notes (Signed)
Patient ID: Faith [name], female   DOB: 14-Jul-1945, 68 y.o.   MRN: [id number]  Chief Complaint  Patient presents with  . Pre-op Exam    mastectomy    HPI Faith [name] is a 68 y.o. female here today for her pre op left mastectomy scheduled for 11/19/13. She wants to talk about having bilateral mastectomy. Her right nipple is inverted. Her last chemotherapy was October 2015. She has left arm lymphedema. Her appointment with Dr. Oliva Bustard is Friday.  HPI  Past Medical History  Diagnosis Date  . Cancer April 2015    left breast    Past Surgical History  Procedure Laterality Date  . Shoulder open rotator cuff repair  2012  . Wrist surgery Left 1969    cyst  . Portacath placement  06-07-13    Family History  Problem Relation Age of Onset  . Adopted: Yes    Social History History  Substance Use Topics  . Smoking status: Former Smoker -- 1.00 packs/day for 15 years    Types: Cigarettes  . Smokeless tobacco: Never Used  . Alcohol Use: Yes    Allergies  Allergen Reactions  . Shellfish Allergy Nausea And Vomiting    Current Outpatient Prescriptions  Medication Sig Dispense Refill  . Calcium Carbonate-Vitamin D (CALCIUM + D PO) Take 1 tablet by mouth daily.      . Cholecalciferol (VITAMIN D PO) Take 5,000 Units by mouth daily.      . ondansetron (ZOFRAN-ODT) 8 MG disintegrating tablet        No current facility-administered medications for this visit.    Review of Systems Review of Systems  Constitutional: Negative.   Respiratory: Negative.   Cardiovascular: Negative.     Blood pressure 178/80, pulse 88, resp. rate 16, height 5\' 7"  (1.702 m), weight 215 lb (97.523 kg).  Physical Exam Physical Exam  Constitutional: She is oriented to person, place, and time. She appears well-developed and well-nourished.  Neurological: She is alert and oriented to person, place, and time.  Skin: Skin is warm and dry.  The rest of exam is unchanged from 11-05-13.  Data  Reviewed CT scan reviewed showing no evidence of metastatic disease outside of left axilla. The lymph nodes are smaller in the left axilla.   Assessment    Stable.    Plan   For left modified radical mastectomy. The patient also interested in right mastectomy. I feel this is reasonable especially with the nipple abnormality on the side, inspite of negative imaging.  Discussed risk and benefits of having a right mastectomy and a modified radial mastectomy left breast. Procedure explained and she is agreeable to proceed.       SANKAR,SEEPLAPUTHUR G 11/13/2013, 1:42 PM

## 2013-11-13 NOTE — Addendum Note (Signed)
Addended by: [name] on: 11/13/2013 11:18 AM   Modules accepted: Orders

## 2013-11-13 NOTE — Patient Instructions (Addendum)
Scheduled for surgery 11/19/13 right mastectomy and modified radial mastectomy left breast  Bulb Drain Home Care A bulb drain consists of a thin rubber tube and a soft, round bulb that creates a gentle suction. The rubber tube is placed in the area where you had surgery. A bulb is attached to the end of the tube that is outside the body. The bulb drain removes excess fluid that normally builds up in a surgical wound after surgery. The color and amount of fluid will vary. Immediately after surgery, the fluid is bright red and is a little thicker than water. It may gradually change to a yellow or pink color and become more thin and water-like. When the amount decreases to about 1 or 2 tbsp in 24 hours, your health care provider will usually remove it. DAILY CARE  Keep the bulb flat (compressed) at all times, except while emptying it. The flatness creates suction. You can flatten the bulb by squeezing it firmly in the middle and then closing the cap.  Keep sites where the tube enters the skin dry and covered with a bandage (dressing).  Secure the tube 1-2 in (2.5-5.1 cm) below the insertion sites to keep it from pulling on your stitches. The tube is stitched in place and will not slip out.  Secure the bulb as directed by your health care provider.  For the first 3 days after surgery, there usually is more fluid in the bulb. Empty the bulb whenever it becomes half full because the bulb does not create enough suction if it is too full. The bulb could also overflow. Write down how much fluid you remove each time you empty your drain. Add up the amount removed in 24 hours.  Empty the bulb at the same time every day once the amount of fluid decreases and you only need to empty it once a day. Write down the amounts and the 24-hour totals to give to your health care provider. This helps your health care provider know when the tubes can be removed. EMPTYING THE BULB DRAIN Before emptying the bulb, get a  measuring cup, a piece of paper and a pen, and wash your hands.  Gently run your fingers down the tube (stripping) to empty any drainage from the tubing into the bulb. This may need to be done several times a day to clear the tubing of clots and tissue.  Open the bulb cap to release suction, which causes it to inflate. Do not touch the inside of the cap.  Gently run your fingers down the tube (stripping) to empty any drainage from the tubing into the bulb.  Hold the cap out of the way, and pour fluid into the measuring cup.   Squeeze the bulb to provide suction.  Replace the cap.   Check the tape that holds the tube to your skin. If it is becoming loose, you can remove the loose piece of tape and apply a new one. Then, pin the bulb to your shirt.   Write down the amount of fluid you emptied out. Write down the date and each time you emptied your bulb drain. (If there are 2 bulbs, note the amount of drainage from each bulb and keep the totals separate. Your health care provider will want to know the total amounts for each drain and which tube is draining more.)   Flush the fluid down the toilet and wash your hands.   Call your health care provider once you have less than  2 tbsp of fluid collecting in the bulb drain every 24 hours. If there is drainage around the tube site, change dressings and keep the area dry. Cleanse around tube with sterile saline and place dry gauze around site. This gauze should be changed when it is soiled. If it stays clean and unsoiled, it should still be changed daily.  SEEK MEDICAL CARE IF:  Your drainage has a bad smell or is cloudy.   You have a fever.   Your drainage is increasing instead of decreasing.   Your tube fell out.   You have redness or swelling around the tube site.   You have drainage from a surgical wound.   Your bulb drain will not stay flat after you empty it.  MAKE SURE YOU:   Understand these instructions.  Will  watch your condition.  Will get help right away if you are not doing well or get worse. Document Released: 01/02/2000 Document Revised: 05/21/2013 Document Reviewed: 06/09/2011 [facility] Patient Information 2015 [place], [place]. This information is not intended to replace advice given to you by your health care provider. Make sure you discuss any questions you have with your health care provider.

## 2013-11-18 ENCOUNTER — Ambulatory Visit: Payer: Self-pay | Admitting: Oncology

## 2013-11-19 ENCOUNTER — Ambulatory Visit: Payer: Self-pay | Admitting: General Surgery

## 2013-11-19 DIAGNOSIS — C50112 Malignant neoplasm of central portion of left female breast: Secondary | ICD-10-CM

## 2013-11-19 HISTORY — PX: BREAST SURGERY: SHX581

## 2013-11-20 ENCOUNTER — Encounter: Payer: Self-pay | Admitting: General Surgery

## 2013-11-22 ENCOUNTER — Ambulatory Visit (INDEPENDENT_AMBULATORY_CARE_PROVIDER_SITE_OTHER): Payer: Self-pay | Admitting: General Surgery

## 2013-11-22 ENCOUNTER — Encounter: Payer: Self-pay | Admitting: General Surgery

## 2013-11-22 VITALS — BP 182/84 | HR 72 | Resp 14 | Ht 67.0 in | Wt 216.0 lb

## 2013-11-22 DIAGNOSIS — C50912 Malignant neoplasm of unspecified site of left female breast: Secondary | ICD-10-CM

## 2013-11-22 NOTE — Patient Instructions (Signed)
Patient to return on Monday 11/26/13 for follow up. The patient is aware to call back for any questions or concerns.

## 2013-11-22 NOTE — Progress Notes (Signed)
Patient ID: Faith [name], female   DOB: April 03, 1945, 69 y.o.   MRN: [id number]  The patient presents for a post op bilateral mastectomy-left MRM, right total. The procedure was performed on 11/19/13. The patient is doing well. She provided her drain sheet at this visit. The patient denies having to use her pain medications at all since surgery.   Incisions are healing well. Drains intact. Drainage amount about 30-40 ml on right and 60 ml on left. Tubings cleared of some clots today. Path is pending. Will recheck early part of next week.

## 2013-11-26 ENCOUNTER — Ambulatory Visit (INDEPENDENT_AMBULATORY_CARE_PROVIDER_SITE_OTHER): Payer: Self-pay | Admitting: General Surgery

## 2013-11-26 ENCOUNTER — Encounter: Payer: Self-pay | Admitting: General Surgery

## 2013-11-26 VITALS — BP 144/82 | HR 70 | Resp 12 | Ht 67.0 in | Wt 214.0 lb

## 2013-11-26 DIAGNOSIS — C50912 Malignant neoplasm of unspecified site of left female breast: Secondary | ICD-10-CM

## 2013-11-26 NOTE — Progress Notes (Signed)
The patient presents for a post op bilateral mastectomy-left MRM, right total. The procedure was performed on 11/19/13. The patient is doing well. She provided her drain sheet at this visit. The patient denies having to use her pain medications at all since surgery.  Incisions are healing well. Drains intact. Drainage amount about 30- 37 ml on right and 66ml on left. Tubings cleared of some clots today.  Patient to return on Wednesday. Hopefully if drainage remains low drains can be removed then

## 2013-11-26 NOTE — Patient Instructions (Signed)
Patient to call on Wednesday about her drains.

## 2013-11-28 ENCOUNTER — Encounter: Payer: Self-pay | Admitting: General Surgery

## 2013-11-28 ENCOUNTER — Ambulatory Visit (INDEPENDENT_AMBULATORY_CARE_PROVIDER_SITE_OTHER): Payer: Self-pay | Admitting: General Surgery

## 2013-11-28 VITALS — BP 140/82 | HR 78 | Resp 14 | Ht 67.0 in | Wt 214.0 lb

## 2013-11-28 DIAGNOSIS — C50912 Malignant neoplasm of unspecified site of left female breast: Secondary | ICD-10-CM

## 2013-11-28 NOTE — Progress Notes (Signed)
Patient ID: Faith [name], female   DOB: [birth year], 68 y.o.   MRN: [id number]   The patient presents for a post op bilateral mastectomy follow up. The procedure was performed on 11/19/13. The patient is doing well. No complaints at this time. Patient has brought in her drain sheet.   Drains removed at today's visit. Drainage was less than 30 ml.  Patient to return in 10 days for follow up. Path- residual ca in left breast skin involvement, 17/17nodes pos Right breast with mets from left breast CA. Pt advised. She is to see Dr. Kathe Mariner- tomorrow.

## 2013-11-28 NOTE — Patient Instructions (Signed)
Patient to return in approximately 10 days for follow up. The patient is aware to call back for any questions or concerns.

## 2013-11-29 LAB — COMPREHENSIVE METABOLIC PANEL
ALT: 38 U/L
ANION GAP: 14 (ref 7–16)
AST: 19 U/L (ref 15–37)
Albumin: 3.2 g/dL — ABNORMAL LOW (ref 3.4–5.0)
Alkaline Phosphatase: 102 U/L
BILIRUBIN TOTAL: 0.3 mg/dL (ref 0.2–1.0)
BUN: 18 mg/dL (ref 7–18)
CALCIUM: 9.5 mg/dL (ref 8.5–10.1)
CO2: 27 mmol/L (ref 21–32)
Chloride: 101 mmol/L (ref 98–107)
Creatinine: 1.13 mg/dL (ref 0.60–1.30)
GFR CALC NON AF AMER: 51 — AB
GLUCOSE: 90 mg/dL (ref 65–99)
OSMOLALITY: 285 (ref 275–301)
POTASSIUM: 3.9 mmol/L (ref 3.5–5.1)
Sodium: 142 mmol/L (ref 136–145)
Total Protein: 7.2 g/dL (ref 6.4–8.2)

## 2013-11-29 LAB — CBC CANCER CENTER
Basophil #: 0.1 x10 3/mm (ref 0.0–0.1)
Basophil %: 0.7 %
EOS ABS: 0.3 x10 3/mm (ref 0.0–0.7)
EOS PCT: 2.9 %
HCT: 33 % — ABNORMAL LOW (ref 35.0–47.0)
HGB: 11.1 g/dL — ABNORMAL LOW (ref 12.0–16.0)
LYMPHS ABS: 1.3 x10 3/mm (ref 1.0–3.6)
LYMPHS PCT: 14.1 %
MCH: 30.7 pg (ref 26.0–34.0)
MCHC: 33.7 g/dL (ref 32.0–36.0)
MCV: 91 fL (ref 80–100)
MONO ABS: 0.8 x10 3/mm (ref 0.2–0.9)
MONOS PCT: 8.3 %
NEUTROS PCT: 74 %
Neutrophil #: 6.8 x10 3/mm — ABNORMAL HIGH (ref 1.4–6.5)
PLATELETS: 296 x10 3/mm (ref 150–440)
RBC: 3.62 10*6/uL — AB (ref 3.80–5.20)
RDW: 14.8 % — ABNORMAL HIGH (ref 11.5–14.5)
WBC: 9.2 x10 3/mm (ref 3.6–11.0)

## 2013-11-30 LAB — CANCER ANTIGEN 27.29: CA 27.29: <3.5 U/mL (ref 0.0–38.6)

## 2013-12-06 ENCOUNTER — Ambulatory Visit (INDEPENDENT_AMBULATORY_CARE_PROVIDER_SITE_OTHER): Payer: Self-pay | Admitting: General Surgery

## 2013-12-06 ENCOUNTER — Encounter: Payer: Self-pay | Admitting: General Surgery

## 2013-12-06 VITALS — BP 140/78 | HR 74 | Resp 14 | Ht 67.0 in | Wt 215.0 lb

## 2013-12-06 DIAGNOSIS — C50912 Malignant neoplasm of unspecified site of left female breast: Secondary | ICD-10-CM

## 2013-12-06 NOTE — Progress Notes (Signed)
The patient presents for a post op bilateral mastectomy follow up. The procedure was performed on 11/19/13. The patient is doing well. No complaints at this time.   Bilateral mastectomy sites clean and no seromas present. Patient to return in 1 month for follow up.   She now has stage 4 breast CA based on finding of mets in right breast. Further treatment plan per oncology.

## 2013-12-06 NOTE — Patient Instructions (Signed)
Patient to return in 1 month. The patient is aware to call back for any questions or concerns.

## 2013-12-07 ENCOUNTER — Encounter: Payer: Self-pay | Admitting: General Surgery

## 2013-12-10 ENCOUNTER — Telehealth: Payer: Self-pay

## 2013-12-10 NOTE — Telephone Encounter (Signed)
Spoke with patient about having a PET scan done. Patient is scheduled for a PET scan at [facility] on 12/18/13 at 12:30 pm.She will only drink water 6 hours prior up to the time of her scan. She may have a high protein, no sugar, low carbohydrate meal 6-8 hours prior. She is to arrive on 12/18/13 at 12:15 pm. Patient is aware of date, time, and instructions.

## 2013-12-11 ENCOUNTER — Encounter: Payer: Self-pay | Admitting: General Surgery

## 2013-12-18 ENCOUNTER — Ambulatory Visit: Payer: Self-pay | Admitting: Oncology

## 2013-12-18 ENCOUNTER — Telehealth: Payer: Self-pay

## 2013-12-18 NOTE — Telephone Encounter (Signed)
Patient notified that her insurance company has denied coverage of her scheduled Pet CT scheduled for 12/18/13. The Pet Scan has been canceled and she will follow up here on 01/08/14.

## 2014-01-04 LAB — CBC CANCER CENTER
BASOS PCT: 1.4 %
Basophil #: 0 x10 3/mm (ref 0.0–0.1)
Eosinophil #: 0.1 x10 3/mm (ref 0.0–0.7)
Eosinophil %: 4.4 %
HCT: 36.9 % (ref 35.0–47.0)
HGB: 12.5 g/dL (ref 12.0–16.0)
LYMPHS ABS: 0.5 x10 3/mm — AB (ref 1.0–3.6)
Lymphocyte %: 24.7 %
MCH: 30.7 pg (ref 26.0–34.0)
MCHC: 33.8 g/dL (ref 32.0–36.0)
MCV: 91 fL (ref 80–100)
Monocyte #: 0.1 x10 3/mm — ABNORMAL LOW (ref 0.2–0.9)
Monocyte %: 4.4 %
Neutrophil #: 1.4 x10 3/mm (ref 1.4–6.5)
Neutrophil %: 65.1 %
PLATELETS: 162 x10 3/mm (ref 150–440)
RBC: 4.07 10*6/uL (ref 3.80–5.20)
RDW: 14 % (ref 11.5–14.5)
WBC: 2.2 x10 3/mm — AB (ref 3.6–11.0)

## 2014-01-08 ENCOUNTER — Encounter: Payer: Self-pay | Admitting: General Surgery

## 2014-01-08 ENCOUNTER — Ambulatory Visit (INDEPENDENT_AMBULATORY_CARE_PROVIDER_SITE_OTHER): Payer: Self-pay | Admitting: General Surgery

## 2014-01-08 VITALS — BP 140/74 | HR 72 | Resp 14 | Ht 67.0 in | Wt 215.0 lb

## 2014-01-08 DIAGNOSIS — C50912 Malignant neoplasm of unspecified site of left female breast: Secondary | ICD-10-CM

## 2014-01-08 NOTE — Patient Instructions (Signed)
Rx given for prosthesis. Patient to return in two months to discuses port removal.

## 2014-01-08 NOTE — Progress Notes (Signed)
The patient presents for a post op bilateral mastectomy follow up. The procedure was performed on 11/19/13. The patient is doing well. No complaints at this time. Patient wants to discuss port removal.  Bilateral mastectomy sites are clean and well healed.   Rx given for prosthesis. Patient to return in two months.  Based on the extensive residual CA in left breast and mets in right breast feel it is more reasonable to leave port in for now. I discussed this in full with pt. She is now on antihormonal therapy. If she shows no signs of cancer in 2-3 mos she can then have the port removed.   Pt ok with this plan.  CC: [name], M.D.

## 2014-01-18 ENCOUNTER — Ambulatory Visit: Payer: Self-pay | Admitting: Oncology

## 2014-01-18 HISTORY — PX: PORT-A-CATH REMOVAL: SHX5289

## 2014-01-25 LAB — COMPREHENSIVE METABOLIC PANEL
ALK PHOS: 102 U/L
ANION GAP: 4 — AB (ref 7–16)
Albumin: 3.5 g/dL (ref 3.4–5.0)
BUN: 19 mg/dL — ABNORMAL HIGH (ref 7–18)
Bilirubin,Total: 0.4 mg/dL (ref 0.2–1.0)
CALCIUM: 9.8 mg/dL (ref 8.5–10.1)
CO2: 31 mmol/L (ref 21–32)
Chloride: 103 mmol/L (ref 98–107)
Creatinine: 1.23 mg/dL (ref 0.60–1.30)
EGFR (African American): 56 — ABNORMAL LOW
GFR CALC NON AF AMER: 46 — AB
Glucose: 112 mg/dL — ABNORMAL HIGH (ref 65–99)
Osmolality: 279 (ref 275–301)
POTASSIUM: 4.4 mmol/L (ref 3.5–5.1)
SGOT(AST): 47 U/L — ABNORMAL HIGH (ref 15–37)
SGPT (ALT): 91 U/L — ABNORMAL HIGH
SODIUM: 138 mmol/L (ref 136–145)
Total Protein: 7.8 g/dL (ref 6.4–8.2)

## 2014-01-25 LAB — CBC CANCER CENTER
Basophil #: 0.1 x10 3/mm (ref 0.0–0.1)
Basophil %: 3.7 %
EOS ABS: 0 x10 3/mm (ref 0.0–0.7)
Eosinophil %: 0 %
HCT: 34.5 % — ABNORMAL LOW (ref 35.0–47.0)
HGB: 11.6 g/dL — AB (ref 12.0–16.0)
Lymphocyte #: 0.7 x10 3/mm — ABNORMAL LOW (ref 1.0–3.6)
Lymphocyte %: 25.2 %
MCH: 29.7 pg (ref 26.0–34.0)
MCHC: 33.6 g/dL (ref 32.0–36.0)
MCV: 88 fL (ref 80–100)
MONO ABS: 0.2 x10 3/mm (ref 0.2–0.9)
Monocyte %: 7.2 %
NEUTROS ABS: 1.9 x10 3/mm (ref 1.4–6.5)
Neutrophil %: 63.9 %
Platelet: 310 x10 3/mm (ref 150–440)
RBC: 3.9 10*6/uL (ref 3.80–5.20)
RDW: 15.7 % — ABNORMAL HIGH (ref 11.5–14.5)
WBC: 2.9 x10 3/mm — ABNORMAL LOW (ref 3.6–11.0)

## 2014-02-18 ENCOUNTER — Ambulatory Visit: Payer: Self-pay | Admitting: Oncology

## 2014-03-11 ENCOUNTER — Encounter: Payer: Self-pay | Admitting: General Surgery

## 2014-03-11 ENCOUNTER — Ambulatory Visit (INDEPENDENT_AMBULATORY_CARE_PROVIDER_SITE_OTHER): Payer: Medicare HMO | Admitting: General Surgery

## 2014-03-11 VITALS — BP 134/72 | HR 78 | Resp 12 | Ht 67.0 in | Wt 217.0 lb

## 2014-03-11 DIAGNOSIS — C50912 Malignant neoplasm of unspecified site of left female breast: Secondary | ICD-10-CM

## 2014-03-11 NOTE — Patient Instructions (Addendum)
Patient advised to get a sleeve to help with left arm/hand swelling. Patient to return in 3 months for follow up. The patient is aware to call back for any questions or concerns.

## 2014-03-11 NOTE — Progress Notes (Signed)
Patient ID: Faith [name], female   DOB: 27-May-1945, 69 y.o.   MRN: [id number]  Chief Complaint  Patient presents with  . Follow-up    port removal discussion/ post op bilatetal mastectomy    HPI Faith [name] is a 69 y.o. female who presents for a port removal discussion. The patient is a post op bilateral mastectomy patient. The procedure was performed on 11/19/13. The patient is doing well. No new complaints at this time.   HPI  Past Medical History  Diagnosis Date  . Cancer April 2015    left breast    Past Surgical History  Procedure Laterality Date  . Shoulder open rotator cuff repair  2012  . Wrist surgery Left 1969    cyst  . Portacath placement  06-07-13  . Breast surgery Bilateral 11/19/13    bilater mastectomy     Family History  Problem Relation Age of Onset  . Adopted: Yes    Social History History  Substance Use Topics  . Smoking status: Former Smoker -- 1.00 packs/day for 15 years    Types: Cigarettes  . Smokeless tobacco: Never Used  . Alcohol Use: 0.0 oz/week    0 Standard drinks or equivalent per week    Allergies  Allergen Reactions  . Shellfish Allergy Nausea And Vomiting    Current Outpatient Prescriptions  Medication Sig Dispense Refill  . Calcium Carbonate-Vitamin D (CALCIUM + D PO) Take 1 tablet by mouth daily.    . Cholecalciferol (VITAMIN D PO) Take 5,000 Units by mouth daily.    [name] 125 MG capsule     . ibuprofen (ADVIL,MOTRIN) 200 MG tablet Take 200 mg by mouth every 6 (six) hours as needed.    Marland Kitchen letrozole (FEMARA) 2.5 MG tablet On hold    . ondansetron (ZOFRAN-ODT) 8 MG disintegrating tablet      No current facility-administered medications for this visit.    Review of Systems Review of Systems  Constitutional: Negative.   Respiratory: Negative.   Cardiovascular: Negative.     Blood pressure 134/72, pulse 78, resp. rate 12, height 5\' 7"  (1.702 m), weight 217 lb (98.431 kg).  Physical Exam Physical Exam   Constitutional: She is oriented to person, place, and time. She appears well-developed and well-nourished.  Eyes: Conjunctivae are normal. No scleral icterus.  Neck: Neck supple. No thyromegaly present.  Cardiovascular: Normal rate, regular rhythm and normal heart sounds.   No murmur heard. Pulmonary/Chest: Effort normal and breath sounds normal.  Well healed bilateral mastectomy sites.   Abdominal: Soft. Bowel sounds are normal. There is no tenderness.  Musculoskeletal:  Mild to moderate lymphedema left arm less so now than a year ago.   Lymphadenopathy:    She has no cervical adenopathy.    She has no axillary adenopathy.  Neurological: She is alert and oriented to person, place, and time.  Skin: Skin is warm and dry.    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 Currently on Letrozole and Ibrance and doing well.     Plan    Recheck in 3 months. Port is intact and would like to leave it for the time being.        Devaughn Savant G 03/11/2014, 9:20 AM

## 2014-03-20 ENCOUNTER — Ambulatory Visit
Admit: 2014-03-20 | Disposition: A | Discharge status: Home / Self Care | Payer: Self-pay | Attending: Oncology | Admitting: Oncology

## 2014-03-22 ENCOUNTER — Encounter
Admit: 2014-03-22 | Disposition: A | Discharge status: Home / Self Care | Payer: Self-pay | Attending: Oncology | Admitting: Oncology

## 2014-04-18 LAB — CBC CANCER CENTER
Basophil #: 0.1 x10 3/mm (ref 0.0–0.1)
Basophil %: 2.6 %
EOS PCT: 0.1 %
Eosinophil #: 0 x10 3/mm (ref 0.0–0.7)
HCT: 37.6 % (ref 35.0–47.0)
HGB: 13 g/dL (ref 12.0–16.0)
Lymphocyte #: 0.9 x10 3/mm — ABNORMAL LOW (ref 1.0–3.6)
Lymphocyte %: 25 %
MCH: 32.7 pg (ref 26.0–34.0)
MCHC: 34.7 g/dL (ref 32.0–36.0)
MCV: 94 fL (ref 80–100)
MONO ABS: 0.1 x10 3/mm — AB (ref 0.2–0.9)
MONOS PCT: 3.9 %
NEUTROS ABS: 2.6 x10 3/mm (ref 1.4–6.5)
NEUTROS PCT: 68.4 %
Platelet: 318 x10 3/mm (ref 150–440)
RBC: 3.99 10*6/uL (ref 3.80–5.20)
RDW: 17.3 % — ABNORMAL HIGH (ref 11.5–14.5)
WBC: 3.8 x10 3/mm (ref 3.6–11.0)

## 2014-04-18 LAB — COMPREHENSIVE METABOLIC PANEL
ALK PHOS: 75 U/L
Albumin: 4.2 g/dL
Anion Gap: 5 — ABNORMAL LOW (ref 7–16)
BILIRUBIN TOTAL: 0.7 mg/dL
BUN: 22 mg/dL — ABNORMAL HIGH
CO2: 29 mmol/L
CREATININE: 1.25 mg/dL — AB
Calcium, Total: 10 mg/dL
Chloride: 102 mmol/L
EGFR (African American): 50 — ABNORMAL LOW
EGFR (Non-African Amer.): 44 — ABNORMAL LOW
Glucose: 116 mg/dL — ABNORMAL HIGH
POTASSIUM: 4.5 mmol/L
SGOT(AST): 35 U/L
SGPT (ALT): 60 U/L — ABNORMAL HIGH
SODIUM: 136 mmol/L
Total Protein: 7.7 g/dL

## 2014-04-19 ENCOUNTER — Encounter
Admit: 2014-04-19 | Disposition: A | Discharge status: Home / Self Care | Payer: Self-pay | Attending: Oncology | Admitting: Oncology

## 2014-04-19 ENCOUNTER — Ambulatory Visit
Admit: 2014-04-19 | Disposition: A | Discharge status: Home / Self Care | Payer: Self-pay | Attending: Oncology | Admitting: Oncology

## 2014-04-19 LAB — CANCER ANTIGEN 27.29: CA 27.29: 19.6 U/mL (ref 0.0–38.6)

## 2014-05-11 NOTE — Op Note (Signed)
PATIENT NAME:  Faith [name], Faith [name] MR#:  [id number] DATE OF BIRTH:  Feb 04, 1945  DATE OF PROCEDURE:  06/07/2013  PREOPERATIVE DIAGNOSIS: Carcinoma of the breast.   POSTOPERATIVE DIAGNOSIS: Carcinoma of the breast.   OPERATION: Insertion of venous access port.   SURGEON: Mckinley Jewel, M.D.   ANESTHESIA: Monitored anesthesia care with local anesthetic of 0.5% Marcaine mixed with 1% Xylocaine.   COMPLICATIONS: None.   ESTIMATED BLOOD LOSS: Minimal.   DESCRIPTION OF PROCEDURE: The patient was placed in the supine position on the operating table and then placed in slight Trendelenburg with adequate sedation and monitoring, the right neck and chest area were prepped and draped out as a sterile field. Timeout was performed. Ultrasound probe with a sterile cover was brought up to the field. The subclavian vein underneath the lateral end of the left clavicle was identified. Local anesthetic was instilled. A small 1 cm incision made. Through this the needle was introduced and with ultrasound guidance positioned into the subclavian vein with free withdrawal of blood Seldinger technique was then utilized and fluoroscopy was then used to position a catheter going towards the superior vena cava and right atrium. The skin level was at 22 cm. The catheter was then tunneled through to the port site. After the port site incision was made over the second costal cartilage. This  subcutaneous pocket was created. The catheter was cut to approximate length and affixed to the prefilled port The port was placed in the pocket and anchored to the underlying fascia with 3 stitches of 2-0 Prolene. It was then flushed through with heparinized saline. Fluoroscopy was repeated to ensure that the catheter had not moved. The subcutaneous tissue was closed 3-0 Vicryl and the skin with subcuticular 4-0 Vicryl, covered with Dermabond. The procedure was well tolerated. She was subsequently returned to the recovery room in stable condition.     ____________________________ S.Robinette Haines, MD sgs:sg D: 06/07/2013 08:22:29 ET T: 06/07/2013 08:53:17 ET JOB#: [id number]  cc: Synthia Innocent. Jamal Collin, MD, <Dictator> [facility] Robinette Haines MD ELECTRONICALLY SIGNED 06/07/2013 14:45

## 2014-05-11 NOTE — Op Note (Signed)
PATIENT NAME:  Faith [name], Faith [name] MR#:  [id number] DATE OF BIRTH:  Apr 22, 1945  DATE OF PROCEDURE:  11/19/2013  PREOPERATIVE DIAGNOSES:  1.  Carcinoma, left breast, post neoadjuvant treatment. 2.  Abnormal physical findings in the right breast with nipple retraction but negative imaging.   OPERATION PERFORMED: 1.  Left modified radical mastectomy.  2.  Right total mastectomy.   SURGEON: Ellwood Sayers, M.D.   ANESTHESIA: General.   COMPLICATIONS: None.   ESTIMATED BLOOD LOSS: Approximately 200 mL.   DRAINS: Blake drain on each side.   PROCEDURE: The patient was put to sleep in the supine position on the operating table. Both breasts and axilla regions were prepped and draped out of sterile field, and a timeout was performed. The left side was operated on first. An elliptical skin incision was mapped out, and the incision was then made, and using cautery for control of bleeding. superior and inferior flaps were then created; upper limit, below the clavicle, and lower limit, the upper rectus fascia. The breast, along with the pectoral fascia, was then dissected off the pectoralis major muscle from the medial to the lateral aspect.  When the lateral end was reached, axillary dissection was completed. The anterior border of the axilla, namely the pectoralis major muscle area was dissected and the axillary fat pad entered. The axillary vein was identified and the tissues from here were swept down using the Harmonic scalpel for the dissection. In the superior aspect of this axillary fat pad, a small, firm 1 cm area was noted, and inferiorly there was a slightly larger and firmer area likely representing the previously involved nodes. Dissection was continued.   The nerve to the latissimus dorsi was identified and preserved.  The nerve to the serratus anterior was also identified and preserved, and after the axillary contents in level 1 and 2 areas were dissected off the axilla, the remaining lateral  attachments were taken down. The lateral end of the breast was tagged for identification and the breast sent to pathology. After ensuring hemostasis, the area was irrigated out and a Blake drain was then positioned going across the chest wall from the medial to the lateral aspect ending in the axilla. Drain was fastened to the skin with a nylon stitch.   The subcutaneous tissue and part of the dermis was reapproximated with interrupted 2-0 Vicryl stitches, and the skin was closed with a running suture of 3-0 Monocryl. The incision was covered with Dermabond subsequently. Following this, the right mastectomy was performed.  Again an elliptical incision was mapped out matching the one on the left. Superior and inferior flaps were then created. The incision was made similar to the left side. Axillary dissection was not performed on this side. The breast along the pectoral fascia was dissected from medial to the lateral aspect and removed and sent with the tagged lateral margin. Again after ensuring hemostasis, the area was irrigated. A Blake drain was positioned and fastened with a nylon stitch. Closure obtained similar to the left side after application Dermabond. Fluffs and ABDs were placed and a surgical bra placed to hold this in place.  The patient subsequently was extubated and returned to the recovery room in stable condition    ____________________________ S.Robinette Haines, MD sgs:jp D: 11/20/2013 07:48:05 ET T: 11/20/2013 09:06:44 ET JOB#: [id number]  cc: S.G. Jamal Collin, MD, <Dictator> [facility] Robinette Haines MD ELECTRONICALLY SIGNED 11/26/2013 8:23

## 2014-05-13 LAB — SURGICAL PATHOLOGY

## 2014-05-16 LAB — CBC CANCER CENTER
BASOS ABS: 0.1 x10 3/mm (ref 0.0–0.1)
Basophil %: 2.9 %
EOS PCT: 0 %
Eosinophil #: 0 x10 3/mm (ref 0.0–0.7)
HCT: 36.1 % (ref 35.0–47.0)
HGB: 12.4 g/dL (ref 12.0–16.0)
LYMPHS ABS: 1 x10 3/mm (ref 1.0–3.6)
Lymphocyte %: 27.5 %
MCH: 32.9 pg (ref 26.0–34.0)
MCHC: 34.3 g/dL (ref 32.0–36.0)
MCV: 96 fL (ref 80–100)
MONOS PCT: 6 %
Monocyte #: 0.2 x10 3/mm (ref 0.2–0.9)
NEUTROS ABS: 2.4 x10 3/mm (ref 1.4–6.5)
Neutrophil %: 63.6 %
PLATELETS: 289 x10 3/mm (ref 150–440)
RBC: 3.77 10*6/uL — ABNORMAL LOW (ref 3.80–5.20)
RDW: 16.5 % — AB (ref 11.5–14.5)
WBC: 3.7 x10 3/mm (ref 3.6–11.0)

## 2014-05-16 LAB — COMPREHENSIVE METABOLIC PANEL
ALBUMIN: 4 g/dL
ANION GAP: 8 (ref 7–16)
Alkaline Phosphatase: 72 U/L
BILIRUBIN TOTAL: 0.5 mg/dL
BUN: 20 mg/dL
CHLORIDE: 104 mmol/L
Calcium, Total: 9.6 mg/dL
Co2: 25 mmol/L
Creatinine: 1 mg/dL
EGFR (Non-African Amer.): 57 — ABNORMAL LOW
Glucose: 110 mg/dL — ABNORMAL HIGH
Potassium: 3.9 mmol/L
SGOT(AST): 26 U/L
SGPT (ALT): 37 U/L
Sodium: 137 mmol/L
TOTAL PROTEIN: 7.4 g/dL

## 2014-05-17 LAB — CANCER ANTIGEN 27.29: CA 27.29: 22.9 U/mL (ref 0.0–38.6)

## 2014-06-07 ENCOUNTER — Other Ambulatory Visit: Payer: Self-pay | Admitting: *Deleted

## 2014-06-07 DIAGNOSIS — C50912 Malignant neoplasm of unspecified site of left female breast: Secondary | ICD-10-CM

## 2014-06-11 ENCOUNTER — Ambulatory Visit: Payer: Medicare FFS | Attending: Oncology | Admitting: Occupational Therapy

## 2014-06-11 DIAGNOSIS — I890 Lymphedema, not elsewhere classified: Secondary | ICD-10-CM

## 2014-06-11 NOTE — Patient Instructions (Signed)
Pt to stay out of compression not longer than hour to 1 1/2 hrs   And tighten the night time compression at elbow and upper arm too - but top one lighter

## 2014-06-11 NOTE — Therapy (Signed)
[facility] [address], [place], [address] Phone: [phone]   Fax:  [phone]  Occupational Therapy Treatment  Patient Details  Name: Faith [name] MRN: [id number] Date of Birth: 1945/10/23 Referring Provider:  [name], MD  Encounter Date: 06/11/2014      OT End of Session - 06/11/14 1219    Visit Number 15   Number of Visits 17   Date for OT Re-Evaluation 06/25/14   Authorization Type Medicare AETNA   OT Start Time 0828   OT Stop Time 0854   OT Time Calculation (min) 26 min   Activity Tolerance Patient tolerated treatment well   Behavior During Therapy [facility] for tasks assessed/performed      Past Medical History  Diagnosis Date  . Cancer April 2015    left breast    Past Surgical History  Procedure Laterality Date  . Shoulder open rotator cuff repair  2012  . Wrist surgery Left 1969    cyst  . Portacath placement  06-07-13  . Breast surgery Bilateral 11/19/13    bilater mastectomy     There were no vitals filed for this visit.  Visit Diagnosis:  Lymphedema      Subjective Assessment - 06/11/14 1207    Subjective  Doing okay - wearing night sleeve and day sleeve with new glove during daytime - still little pocket on side of top of hand wants to swell but not as bad as before - much better    Patient Stated Goals Want to keep the swelling undercontrol - if you can check me       Retired, has Surveyor, [employer] in [place] - hobbies include reading, gardening and take care of animals on farm - use both hands evenly - Married   Lymphedema started in L UE Nov/Dec 2014 , was diagnose with L breast CA in April 2015 - had chemo until Oct 2015 and then had bilateral mastectomies - lymphedema in hand did improve after surgery - but arm about the same - see surgeon and he told her to talk to Dr Oliva Bustard about lymphedema treatment  - was seen since 03/21/14 - was hold up waiting for garments         LYMPHEDEMA/ONCOLOGY QUESTIONNAIRE - 06/11/14 0830    Type   Cancer Type breast   Surgeries   Mastectomy Date 11/19/13   Number Lymph Nodes Removed 2   Lymphedema Stage   Stage STAGE 2 SPONTANEOUSLY IRREVERSIBLE   Right Upper Extremity Lymphedema   15 cm Proximal to Olecranon Process 39 cm   10 cm Proximal to Olecranon Process 39.5 cm   Olecranon Process 31.5 cm   15 cm Proximal to Ulnar Styloid Process 30 cm   10 cm Proximal to Ulnar Styloid Process 25.6 cm   Just Proximal to Ulnar Styloid Process 19 cm   Across Hand at [employer] 20.2 cm   At [place] of 2nd Digit 6.7 cm   At [facility] of Thumb 6.7 cm   Left Upper Extremity Lymphedema   15 cm Proximal to Olecranon Process 37.5 cm   10 cm Proximal to Olecranon Process 39 cm   Olecranon Process 36 cm   15 cm Proximal to Ulnar Styloid Process 33.3 cm   10 cm Proximal to Ulnar Styloid Process 30 cm   Just Proximal to Ulnar Styloid Process 20.5 cm   Across Hand at [employer] 21 cm   At [place] of  2nd Digit 6.9 cm   At [facility] of Thumb 6.5 cm                 OT Treatments/Exercises (OP) - 2014-06-24 0001    ADLs   ADL Comments Assess pt's daytime compression sleeve and glove  - fit well and review with pt again night time sleeve - she tighten up the forearm - but need to increase pressure at elbow and mid upper arm - and will assess again in little over week    Manual Therapy   Manual therapy comments MEasurements taken - see flowsheet on lymphedema  - did increase some at elbow and mid upper arm                 OT Education - 06-24-2014 04-30-1217    Education provided Yes   Education Details see pt instruction   Person(s) Educated Patient   Methods Explanation;Demonstration;Verbal cues   Comprehension Verbalized understanding;Returned demonstration          OT Short Term Goals - 06/24/2014 1226    OT SHORT TERM GOAL #1   Title Pt and family will be I in wearing compression garments and glove for day and night time  to maintain L UE circumference   Time 2   Period Weeks   Status On-going           OT Long Term Goals - 06/24/14 1227    OT LONG TERM GOAL #1   Title . Pt and family will be I in wearing compression garments and glove for day/night time to maintain L UE circumference    Time 2   Period Weeks   Status On-going               Plan - 2014-06-24 1222    Clinical Impression Statement Pt compression garment for daytime glove was remade and fitted - pt show great circumference of L UE except elbow and mid upper arm is increase by 1-2 cm - pt report she did not wear her night time garment for few nights but did pump - reinforce importance of ngiht time compression that it decrease where daytime one maintain - pt to adjust nightime garments straps at elbow and upper arm and will reasess later nex week    Pt will benefit from skilled therapeutic intervention in order to improve on the following deficits (Retired) Increased edema;Decreased knowledge of precautions;Decreased knowledge of use of DME   Rehab Potential Good   OT Frequency 1x / week   OT Duration 2 weeks   OT Treatment/Interventions Self-care/ADL training;Manual lymph drainage;Compression bandaging   OT Home Exercise Plan see pt instruction   Consulted and Agree with Plan of Care Patient          G-Codes - 06/24/2014 05-01-27    Functional Assessment Tool Used lympedema measurements , quickdash , clinical judgement   Functional Limitation Self care   Self Care Current Status [phone]) At least 20 percent but less than 40 percent impaired, limited or restricted   Self Care Goal Status (E1740) At least 1 percent but less than 20 percent impaired, limited or restricted      Problem List Patient Active Problem List   Diagnosis Date Noted  . Breast cancer 11/13/2013    Rosalyn Gess OTR/L ,CLT 06-24-14, 12:32 PM  [facility] [address]  [place], [address] Phone: [phone]   Fax:  [phone]

## 2014-06-13 ENCOUNTER — Encounter: Payer: Self-pay | Admitting: General Surgery

## 2014-06-13 ENCOUNTER — Encounter: Payer: Self-pay | Admitting: Oncology

## 2014-06-13 ENCOUNTER — Inpatient Hospital Stay: Payer: Medicare HMO | Attending: Oncology

## 2014-06-13 ENCOUNTER — Inpatient Hospital Stay (HOSPITAL_BASED_OUTPATIENT_CLINIC_OR_DEPARTMENT_OTHER): Payer: Medicare HMO | Admitting: Oncology

## 2014-06-13 ENCOUNTER — Ambulatory Visit (INDEPENDENT_AMBULATORY_CARE_PROVIDER_SITE_OTHER): Payer: Medicare HMO | Admitting: General Surgery

## 2014-06-13 VITALS — BP 175/81 | HR 87 | Temp 95.9°F | Wt 222.0 lb

## 2014-06-13 VITALS — BP 166/86 | HR 88 | Resp 16 | Ht 67.0 in | Wt 222.0 lb

## 2014-06-13 DIAGNOSIS — Z79811 Long term (current) use of aromatase inhibitors: Secondary | ICD-10-CM

## 2014-06-13 DIAGNOSIS — Z9221 Personal history of antineoplastic chemotherapy: Secondary | ICD-10-CM

## 2014-06-13 DIAGNOSIS — I972 Postmastectomy lymphedema syndrome: Secondary | ICD-10-CM

## 2014-06-13 DIAGNOSIS — C50912 Malignant neoplasm of unspecified site of left female breast: Secondary | ICD-10-CM | POA: Diagnosis not present

## 2014-06-13 DIAGNOSIS — C7981 Secondary malignant neoplasm of breast: Secondary | ICD-10-CM | POA: Diagnosis not present

## 2014-06-13 DIAGNOSIS — I890 Lymphedema, not elsewhere classified: Secondary | ICD-10-CM

## 2014-06-13 DIAGNOSIS — Z170 Estrogen receptor positive status [ER+]: Secondary | ICD-10-CM | POA: Diagnosis not present

## 2014-06-13 DIAGNOSIS — C773 Secondary and unspecified malignant neoplasm of axilla and upper limb lymph nodes: Secondary | ICD-10-CM | POA: Diagnosis not present

## 2014-06-13 DIAGNOSIS — Z780 Asymptomatic menopausal state: Secondary | ICD-10-CM | POA: Diagnosis not present

## 2014-06-13 DIAGNOSIS — I10 Essential (primary) hypertension: Secondary | ICD-10-CM | POA: Diagnosis not present

## 2014-06-13 DIAGNOSIS — Z9013 Acquired absence of bilateral breasts and nipples: Secondary | ICD-10-CM | POA: Insufficient documentation

## 2014-06-13 DIAGNOSIS — Z79899 Other long term (current) drug therapy: Secondary | ICD-10-CM | POA: Diagnosis not present

## 2014-06-13 DIAGNOSIS — Z87891 Personal history of nicotine dependence: Secondary | ICD-10-CM

## 2014-06-13 LAB — COMPREHENSIVE METABOLIC PANEL
ALT: 35 U/L (ref 14–54)
AST: 24 U/L (ref 15–41)
Albumin: 4.1 g/dL (ref 3.5–5.0)
Alkaline Phosphatase: 80 U/L (ref 38–126)
Anion gap: 4 — ABNORMAL LOW (ref 5–15)
BUN: 23 mg/dL — ABNORMAL HIGH (ref 6–20)
CO2: 29 mmol/L (ref 22–32)
Calcium: 9.7 mg/dL (ref 8.9–10.3)
Chloride: 104 mmol/L (ref 101–111)
Creatinine, Ser: 1.08 mg/dL — ABNORMAL HIGH (ref 0.44–1.00)
GFR calc Af Amer: 59 mL/min — ABNORMAL LOW (ref >60)
GFR calc non Af Amer: 51 mL/min — ABNORMAL LOW (ref >60)
Glucose, Bld: 94 mg/dL (ref 65–99)
Potassium: 4 mmol/L (ref 3.5–5.1)
Sodium: 137 mmol/L (ref 135–145)
Total Bilirubin: 0.6 mg/dL (ref 0.3–1.2)
Total Protein: 7.7 g/dL (ref 6.5–8.1)

## 2014-06-13 LAB — CBC WITH DIFFERENTIAL/PLATELET
Basophils Absolute: 0.1 10*3/uL (ref 0–0.1)
Basophils Relative: 3 %
EOS ABS: 0 10*3/uL (ref 0–0.7)
EOS PCT: 0 %
HCT: 37 % (ref 35.0–47.0)
HEMOGLOBIN: 12.8 g/dL (ref 12.0–16.0)
Lymphocytes Relative: 21 %
Lymphs Abs: 0.7 10*3/uL — ABNORMAL LOW (ref 1.0–3.6)
MCH: 33.6 pg (ref 26.0–34.0)
MCHC: 34.5 g/dL (ref 32.0–36.0)
MCV: 97.4 fL (ref 80.0–100.0)
MONO ABS: 0.2 10*3/uL (ref 0.2–0.9)
Monocytes Relative: 6 %
Neutro Abs: 2.4 10*3/uL (ref 1.4–6.5)
Neutrophils Relative %: 70 %
Platelets: 315 10*3/uL (ref 150–440)
RBC: 3.8 MIL/uL (ref 3.80–5.20)
RDW: 15.8 % — AB (ref 11.5–14.5)
WBC: 3.4 10*3/uL — ABNORMAL LOW (ref 3.6–11.0)

## 2014-06-13 NOTE — Patient Instructions (Signed)
The patient is aware to call back for any questions or concerns.  

## 2014-06-13 NOTE — Progress Notes (Signed)
Former smoker. Quit 1987. Pt does not have living will.  Gave information today.

## 2014-06-13 NOTE — Progress Notes (Signed)
Patient ID: Faith [name], female   DOB: November 26, 1945, 69 y.o.   MRN: [id number]  Chief Complaint  Patient presents with  . Follow-up    HPI Faith [name] is a 69 y.o. female.  Here today for follow up breast cancer. She states she is doing well. She is wearing her compression sleeve for left arm lymphedema. She is tolerating the letrozole.  HPI  Past Medical History  Diagnosis Date  . Cancer April 2015    left breast    Past Surgical History  Procedure Laterality Date  . Shoulder open rotator cuff repair  2012  . Wrist surgery Left 1969    cyst  . Portacath placement  06-07-13  . Breast surgery Bilateral 11/19/13    bilater mastectomy     Family History  Problem Relation Age of Onset  . Adopted: Yes    Social History History  Substance Use Topics  . Smoking status: Former Smoker -- 1.00 packs/day for 15 years    Types: Cigarettes  . Smokeless tobacco: Never Used  . Alcohol Use: 0.0 oz/week    0 Standard drinks or equivalent per week    Allergies  Allergen Reactions  . Shellfish Allergy Nausea And Vomiting    Current Outpatient Prescriptions  Medication Sig Dispense Refill  . Calcium Carbonate-Vitamin D (CALCIUM + D PO) Take 1 tablet by mouth daily.    . Cholecalciferol (VITAMIN D PO) Take 5,000 Units by mouth daily.    [name] 125 MG capsule     . ibuprofen (ADVIL,MOTRIN) 200 MG tablet Take 200 mg by mouth every 6 (six) hours as needed.    Marland Kitchen letrozole (FEMARA) 2.5 MG tablet Take 2.5 mg by mouth daily. On hold    . ondansetron (ZOFRAN-ODT) 8 MG disintegrating tablet      No current facility-administered medications for this visit.    Review of Systems Review of Systems  Constitutional: Negative.   Respiratory: Negative.   Cardiovascular: Negative.     Blood pressure 166/86, pulse 88, resp. rate 16, height 5\' 7"  (1.702 m), weight 222 lb (100.699 kg).  Physical Exam Physical Exam  Constitutional: She is oriented to person, place, and time. She appears  well-developed and well-nourished.  Eyes: Conjunctivae are normal. No scleral icterus.  Neck: Neck supple.  Cardiovascular: Normal rate, regular rhythm and normal heart sounds.   Pulmonary/Chest: Effort normal and breath sounds normal.  Both mastectomy site are clean, no evidence of reoccurrence.  Lymphadenopathy:    She has no cervical adenopathy.    She has no axillary adenopathy.  Moderate lymphedema left arm.  Neurological: She is alert and oriented to person, place, and time.  Skin: Skin is warm and dry.    Data Reviewed Office notes.  Assessment    Stable physical exam. CA left breast withy mets in right breast, s/p bil mastectomy. Lymphedema left arm-under control. Pt on Letrazole    Plan    Follow up in 3 months.     Cc: [name], M.D. PCP:  No Pcp  Junie Panning G 06/13/2014, 9:19 AM

## 2014-06-14 ENCOUNTER — Encounter: Payer: Self-pay | Admitting: Oncology

## 2014-06-14 LAB — CANCER ANTIGEN 27.29: CA 27.29: 14.3 U/mL (ref 0.0–38.6)

## 2014-06-14 NOTE — Progress Notes (Signed)
[place] @ [facility] [phone]  Fax:(336) [place] OB: [birth year]  MR#: [id number]  [facility]#:[id number]  Patient Care Team: No Pcp Per Patient as PCP - General (General Practice) [name], MD (Unknown Physician Specialty) [name], MD (General Surgery)  CHIEF COMPLAINT:  Chief Complaint  Patient presents with  . Follow-up    Oncology History   1. 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  Biopsy from the lymph node in the left axilla positive for metastatic memory carcinoma Estrogen receptor positive Progesterone receptor +ve hER-2/neu receptors equivocal  by Fish  IHC for HER-2/neu is 2+ Clinically staged asT4 D. N1 M0 tumor stage IV  locally advanced carcinoma.. 2. She was started on   [place] OF 2015. 3. Treatment was changed to Cytoxan and Adriamycinffrom August 31, 2013  4.patient has finished oral 3 cycles of chemotherapy with Cytoxan and Adriamycin on October 19, 2013. 5.Patient had a bilateral mastectomy in November of 2015. ypT4B ypN3 ypM1 STAGE iv DISEASE.. repeat HER-2/neu receptor by [facility] is still equivocal (November, 2015)     Breast cancer   11/13/2013 Initial Diagnosis Breast cancer    No flowsheet data found.  INTERVAL HISTORY 69 year old lady with bilateral carcinoma breast.  Lymphedema in left upper extremity.  On I Ibrance and letrozole for persistent disease after surgery. Here for further follow-up.  Tolerating   Ibrance well.  No bony pains. No shortness of breath.  Taking lymphedema therapy-seeing some improvement  REVIEW OF SYSTEMS:   GENERAL:  Feels good.  Active.  No fevers, sweats or weight loss. PERFORMANCE STATUS (ECOG): 0 HEENT:  No visual changes, runny nose, sore throat, mouth sores or tenderness. Lungs: No shortness of breath or cough.  No hemoptysis. Cardiac:  No chest pain,  palpitations, orthopnea, or PND. GI:  No nausea, vomiting, diarrhea, constipation, melena or hematochezia. GU:  No urgency, frequency, dysuria, or hematuria. Musculoskeletal:  No back pain.  No joint pain.  No muscle tenderness. Extremities: lymph  Edema of left upper extremity Skin:  No rashes or skin changes. Neuro:  No headache, numbness or weakness, balance or coordination issues. Endocrine:  No diabetes, thyroid issues, hot flashes or night sweats. Psych:  No mood changes, depression or anxiety. Pain:  No focal pain. Review of systems:  All other systems reviewed and found to be negative.  As per HPI. Otherwise, a complete review of systems is negatve.  PAST MEDICAL HISTORY: Past Medical History  Diagnosis Date  . Cancer April 2015    left breast    PAST SURGICAL HISTORY: Past Surgical History  Procedure Laterality Date  . Shoulder open rotator cuff repair  2012  . Wrist surgery Left 1969    cyst  . Portacath placement  06-07-13  . Breast surgery Bilateral 11/19/13    bilater mastectomy     FAMILY HISTORY Family History  Problem Relation Age of Onset  . Adopted: Yes   Allergies:  Mushrooms: Hives  Shellfish: N/V/Diarrhea  Significant History/PMH:   Breast Cancer:    Hypertension:    Denies medical history:    :   :   Preventive Screening:  Has patient had any of the following test? Mammography  Pap Smear (1)   Last Mammography: april 2015(1)   Last Pap Smear: about 2 years ago(1)   Smoking History: Smoking History quit several years  ago(1)  PFSH: Comments: No family history of colorectal cancer, breast cancer, or ovarian cancer.  Social History: negative alcohol, negative tobacco  Additional Past Medical and Surgical History: no significant previous medical or surgical past history   GYNECOLOGIC HISTORY:  No LMP recorded. Patient is postmenopausal.     ADVANCED DIRECTIVES:    HEALTH MAINTENANCE: History  Substance Use Topics  . Smoking  status: Former Smoker -- 1.00 packs/day for 15 years    Types: Cigarettes    Quit date: 06/12/1985  . Smokeless tobacco: Never Used  . Alcohol Use: 0.0 oz/week    0 Standard drinks or equivalent per week     Allergies  Allergen Reactions  . Shellfish Allergy Nausea And Vomiting    Current Outpatient Prescriptions  Medication Sig Dispense Refill  . Calcium Carbonate-Vitamin D (CALCIUM + D PO) Take 1 tablet by mouth daily.    . Cholecalciferol (VITAMIN D PO) Take 5,000 Units by mouth daily.    [name] 125 MG capsule     . ibuprofen (ADVIL,MOTRIN) 200 MG tablet Take 200 mg by mouth every 6 (six) hours as needed.    Marland Kitchen letrozole (FEMARA) 2.5 MG tablet Take 2.5 mg by mouth daily. On hold    . ondansetron (ZOFRAN-ODT) 8 MG disintegrating tablet      No current facility-administered medications for this visit.    OBJECTIVE:  Filed Vitals:   06/13/14 1016  BP: 175/81  Pulse: 87  Temp: 95.9 F (35.5 C)     Body mass index is 34.76 kg/(m^2).    ECOG FS:0 - Asymptomatic  PHYSICAL EXAM: GENERAL:  Well developed, well nourished, sitting comfortably in the exam room in no acute distress. MENTAL STATUS:  Alert and oriented to person, place and time. HEAD:  Normocephalic, atraumatic, face symmetric, no Cushingoid features. EYES Pupils equal round and reactive to light and accomodation.  No conjunctivitis or scleral icterus. ENT:  Oropharynx clear without lesion.  Tongue normal. Mucous membranes moist.  RESPIRATORY:  Clear to auscultation without rales, wheezes or rhonchi. CARDIOVASCULAR:  Regular rate and rhythm without murmur, rub or gallop. BREAST:  Bilateral mastectomy.  Chest wall area no evidence of recurrent disease ABDOMEN:  Soft, non-tender, with active bowel sounds, and no hepatosplenomegaly.  No masses. BACK:  No CVA tenderness.  No tenderness on percussion of the back or rib cage. SKIN:  No rashes, ulcers or lesions. EXTREMITIES: Left upper extremity lymphedema.  Getting  better LYMPH NODES: No palpable cervical, supraclavicular, axillary or inguinal adenopathy  NEUROLOGICAL: Unremarkable. PSYCH:  Appropriate.   LAB RESULTS:  Appointment on 06/13/2014  Component Date Value Ref Range Status  . WBC 06/13/2014 3.4* 3.6 - 11.0 K/uL Final  . RBC 06/13/2014 3.80  3.80 - 5.20 MIL/uL Final  . Hemoglobin 06/13/2014 12.8  12.0 - 16.0 g/dL Final  . HCT 06/13/2014 37.0  35.0 - 47.0 % Final  . MCV 06/13/2014 97.4  80.0 - 100.0 fL Final  . MCH 06/13/2014 33.6  26.0 - 34.0 pg Final  . MCHC 06/13/2014 34.5  32.0 - 36.0 g/dL Final  . RDW 06/13/2014 15.8* 11.5 - 14.5 % Final  . Platelets 06/13/2014 315  150 - 440 K/uL Final  . Neutrophils Relative % 06/13/2014 70   Final  . Neutro Abs 06/13/2014 2.4  1.4 - 6.5 K/uL Final  . Lymphocytes Relative 06/13/2014 21   Final  . Lymphs Abs 06/13/2014 0.7* 1.0 - 3.6 K/uL Final  . Monocytes Relative 06/13/2014 6   Final  .  Monocytes Absolute 06/13/2014 0.2  0.2 - 0.9 K/uL Final  . Eosinophils Relative 06/13/2014 0   Final  . Eosinophils Absolute 06/13/2014 0.0  0 - 0.7 K/uL Final  . Basophils Relative 06/13/2014 3   Final  . Basophils Absolute 06/13/2014 0.1  0 - 0.1 K/uL Final  . Sodium 06/13/2014 137  135 - 145 mmol/L Final  . Potassium 06/13/2014 4.0  3.5 - 5.1 mmol/L Final  . Chloride 06/13/2014 104  101 - 111 mmol/L Final  . CO2 06/13/2014 29  22 - 32 mmol/L Final  . Glucose, Bld 06/13/2014 94  65 - 99 mg/dL Final  . BUN 06/13/2014 23* 6 - 20 mg/dL Final  . Creatinine, Ser 06/13/2014 1.08* 0.44 - 1.00 mg/dL Final  . Calcium 06/13/2014 9.7  8.9 - 10.3 mg/dL Final  . Total Protein 06/13/2014 7.7  6.5 - 8.1 g/dL Final  . Albumin 06/13/2014 4.1  3.5 - 5.0 g/dL Final  . AST 06/13/2014 24  15 - 41 U/L Final  . ALT 06/13/2014 35  14 - 54 U/L Final  . Alkaline Phosphatase 06/13/2014 80  38 - 126 U/L Final  . Total Bilirubin 06/13/2014 0.6  0.3 - 1.2 mg/dL Final  . GFR calc non Af Amer 06/13/2014 51* >60 mL/min Final  . GFR  calc Af Amer 06/13/2014 59* >60 mL/min Final   Comment: (NOTE) The eGFR has been calculated using the CKD EPI equation. This calculation has not been validated in all clinical situations. eGFR's persistently <60 mL/min signify possible Chronic Kidney Disease.   . Anion gap 06/13/2014 4* 5 - 15 Final  . CA 27.29 06/13/2014 14.3  0.0 - 38.6 U/mL Final   Comment: (NOTE) Bayer Centaur/ACS methodology Performed At: [facility] [place], [place] [id number] Lindon Romp MD GB:[id number]     Lab Results  Component Value Date   LABCA2 14.3 06/13/2014     STUDIES: No results found.  ASSESSMENT: 69 year old lady with history of carcinoma breast stage IV disease.  Presently on now Ibrance and letrozole therapy.  Tolerating treatment very well. Lymphedema   MEDICAL DECISION MAKING:  Continue anti-hormonal treatment Continue lymphedema therapy Review of CA-27-29 shows declining trend.  Patient expressed understanding and was in agreement with this plan. She also understands that She can call clinic at any time with any questions, concerns, or complaints.    Breast cancer   Staging form: Breast, AJCC 7th Edition     Clinical: Stage IV (T3, N3, M1) - Signed by [name], MD on 06/14/2014   [name], MD   06/14/2014 10:50 AM

## 2014-06-19 ENCOUNTER — Ambulatory Visit: Payer: Medicare HMO | Attending: Oncology | Admitting: Occupational Therapy

## 2014-06-19 DIAGNOSIS — I890 Lymphedema, not elsewhere classified: Secondary | ICD-10-CM

## 2014-06-19 NOTE — Patient Instructions (Signed)
Pt was instructed sleeve wearing and need replace in 8-9 months or when notice day sleeve loosing compression  Use pump - in am can do maybe little shorter 45 min and evening hour

## 2014-06-19 NOTE — Therapy (Signed)
[place] [facility] [address] [place], [address] Phone: [phone]   Fax:  [phone]  Occupational Therapy Treatment and discharge  Patient Details  Name: Faith [name] MRN: [id number] Date of Birth: 08-26-1945 Referring Provider:  [name], MD  Encounter Date: 06/19/2014      OT End of Session - 06/19/14 0959    Visit Number 16   Date for OT Re-Evaluation 06/25/14   Authorization Type Medicare AETNA   OT Start Time 0933   OT Stop Time 0957   OT Time Calculation (min) 24 min   Activity Tolerance Patient tolerated treatment well   Behavior During Therapy [facility] for tasks assessed/performed      Past Medical History  Diagnosis Date  . Cancer April 2015    left breast    Past Surgical History  Procedure Laterality Date  . Shoulder open rotator cuff repair  2012  . Wrist surgery Left 1969    cyst  . Portacath placement  06-07-13  . Breast surgery Bilateral 11/19/13    bilater mastectomy     There were no vitals filed for this visit.  Visit Diagnosis:  Lymphedema      Subjective Assessment - 06/19/14 0951    Subjective  I am doing okay - wearing the daysleeve and glove and it is keeping the swelling in my hand down - and between the night sleeve and pump my arm decrease if it went up some what in my daysleeve - after last times recommendations by you I could see difference the next night   Patient Stated Goals I am just making it part of my routine wearing the sleeves and  doing the pump   Currently in Pain? No/denies             LYMPHEDEMA/ONCOLOGY QUESTIONNAIRE - 06/19/14 0934    Left Upper Extremity Lymphedema   15 cm Proximal to Olecranon Process 37.4 cm   10 cm Proximal to Olecranon Process 38.3 cm   Olecranon Process 35 cm   15 cm Proximal to Ulnar Styloid Process 32.9 cm   10 cm Proximal to Ulnar Styloid Process 29 cm   Just Proximal to Ulnar Styloid Process 20 cm   Across Hand  at [employer] 21 cm   At [place] of 2nd Digit 6.5 cm   At [facility] of Thumb 6.4 cm                 OT Treatments/Exercises (OP) - 06/19/14 0001    ADLs   ADL Comments Discuss with pt about wearing of sleeves and  using pump - can do 45 min in am and hour in pm - and then need to put on her calendar  that daysleeve need to be replace in 8-9 months -or when notice sleeve loosing compression   Manual Therapy   Manual therapy comments MEasurements taken - see flowsheet on lymphedema  - did increase some at elbow and mid upper arm                 OT Education - 06/19/14 0957    Education provided Yes   Education Details HEP    Person(s) Educated Patient   Methods Explanation;Demonstration;Verbal cues   Comprehension Verbalized understanding;Verbal cues required          OT Short Term Goals - 06/19/14 1004    OT SHORT TERM GOAL #1   Title Pt and family will be I  in wearing compression garments and glove for day and night time to maintain L UE circumference   Status Achieved           OT Long Term Goals - 06/24/2014 1004    OT LONG TERM GOAL #1   Title . Pt and family will be I in wearing compression garments and glove for day/night time to maintain L UE circumference    Status Achieved               Plan - June 24, 2014 1000    Clinical Impression Statement Pt L UE lymphedema decreased by 1-2 cm from wrist to upper arm - pt in Elvarex soft sleeve and glove - as well as Reidsleeve for night time - Pt do use pump in the am and pm  - doing great job with using garments and pump keeping lymphedema under control - pt to  cont with home program and need to put on her calender to replace daysleeve and glove in 8-9 months - met all goals  - discharge at this time    Rehab Potential Good   OT Treatment/Interventions Self-care/ADL training;Manual lymph drainage;Compression bandaging   OT Home Exercise Plan see pt instruction   Consulted and Agree with Plan of Care  Patient          G-Codes - 06/24/2014 1005    Functional Assessment Tool Used lympedema measurements ,  clinical judgement   Functional Limitation Self care   Self Care Current Status (F8101) At least 1 percent but less than 20 percent impaired, limited or restricted   Self Care Goal Status (B5102) At least 1 percent but less than 20 percent impaired, limited or restricted      Problem List Patient Active Problem List   Diagnosis Date Noted  . Breast cancer 11/13/2013    Rosalyn Gess OTR/L, CLT 06/24/14, 10:06 AM  [place] [facility] [address], [place], [address] Phone: [phone]   Fax:  [phone]

## 2014-06-20 ENCOUNTER — Encounter: Payer: Self-pay | Admitting: Occupational Therapy

## 2014-07-09 ENCOUNTER — Ambulatory Visit: Payer: Medicare FFS | Admitting: Oncology

## 2014-07-09 ENCOUNTER — Other Ambulatory Visit: Payer: Medicare FFS

## 2014-07-15 ENCOUNTER — Other Ambulatory Visit: Payer: Self-pay

## 2014-07-16 ENCOUNTER — Inpatient Hospital Stay: Payer: Medicare HMO

## 2014-07-16 ENCOUNTER — Encounter: Payer: Self-pay | Admitting: Oncology

## 2014-07-16 ENCOUNTER — Inpatient Hospital Stay: Payer: Medicare HMO | Attending: Oncology | Admitting: Oncology

## 2014-07-16 ENCOUNTER — Other Ambulatory Visit: Payer: Self-pay

## 2014-07-16 VITALS — BP 171/93 | HR 86 | Temp 97.0°F | Wt 225.5 lb

## 2014-07-16 DIAGNOSIS — C773 Secondary and unspecified malignant neoplasm of axilla and upper limb lymph nodes: Secondary | ICD-10-CM

## 2014-07-16 DIAGNOSIS — C7981 Secondary malignant neoplasm of breast: Secondary | ICD-10-CM | POA: Diagnosis not present

## 2014-07-16 DIAGNOSIS — Z87891 Personal history of nicotine dependence: Secondary | ICD-10-CM | POA: Diagnosis not present

## 2014-07-16 DIAGNOSIS — Z452 Encounter for adjustment and management of vascular access device: Secondary | ICD-10-CM | POA: Diagnosis not present

## 2014-07-16 DIAGNOSIS — C50919 Malignant neoplasm of unspecified site of unspecified female breast: Secondary | ICD-10-CM

## 2014-07-16 DIAGNOSIS — I972 Postmastectomy lymphedema syndrome: Secondary | ICD-10-CM | POA: Insufficient documentation

## 2014-07-16 DIAGNOSIS — Z79811 Long term (current) use of aromatase inhibitors: Secondary | ICD-10-CM | POA: Diagnosis not present

## 2014-07-16 DIAGNOSIS — C50912 Malignant neoplasm of unspecified site of left female breast: Secondary | ICD-10-CM | POA: Diagnosis not present

## 2014-07-16 DIAGNOSIS — I10 Essential (primary) hypertension: Secondary | ICD-10-CM | POA: Diagnosis not present

## 2014-07-16 DIAGNOSIS — Z79899 Other long term (current) drug therapy: Secondary | ICD-10-CM

## 2014-07-16 DIAGNOSIS — Z170 Estrogen receptor positive status [ER+]: Secondary | ICD-10-CM | POA: Diagnosis not present

## 2014-07-16 DIAGNOSIS — Z9013 Acquired absence of bilateral breasts and nipples: Secondary | ICD-10-CM | POA: Insufficient documentation

## 2014-07-16 DIAGNOSIS — Z79818 Long term (current) use of other agents affecting estrogen receptors and estrogen levels: Secondary | ICD-10-CM | POA: Insufficient documentation

## 2014-07-16 LAB — COMPREHENSIVE METABOLIC PANEL
ALBUMIN: 4 g/dL (ref 3.5–5.0)
ALT: 31 U/L (ref 14–54)
AST: 24 U/L (ref 15–41)
Alkaline Phosphatase: 81 U/L (ref 38–126)
Anion gap: 7 (ref 5–15)
BUN: 21 mg/dL — ABNORMAL HIGH (ref 6–20)
CALCIUM: 8.9 mg/dL (ref 8.9–10.3)
CHLORIDE: 103 mmol/L (ref 101–111)
CO2: 25 mmol/L (ref 22–32)
CREATININE: 0.95 mg/dL (ref 0.44–1.00)
GFR calc Af Amer: >60 mL/min (ref >60)
GFR calc non Af Amer: 59 mL/min — ABNORMAL LOW (ref >60)
Glucose, Bld: 119 mg/dL — ABNORMAL HIGH (ref 65–99)
Potassium: 3.7 mmol/L (ref 3.5–5.1)
Sodium: 135 mmol/L (ref 135–145)
Total Bilirubin: 0.6 mg/dL (ref 0.3–1.2)
Total Protein: 7.4 g/dL (ref 6.5–8.1)

## 2014-07-16 LAB — CBC WITH DIFFERENTIAL/PLATELET
BASOS ABS: 0 10*3/uL (ref 0–0.1)
Basophils Relative: 1 %
EOS ABS: 0 10*3/uL (ref 0–0.7)
EOS PCT: 1 %
HCT: 37.6 % (ref 35.0–47.0)
Hemoglobin: 12.8 g/dL (ref 12.0–16.0)
LYMPHS PCT: 29 %
Lymphs Abs: 1.1 10*3/uL (ref 1.0–3.6)
MCH: 33.8 pg (ref 26.0–34.0)
MCHC: 34 g/dL (ref 32.0–36.0)
MCV: 99.4 fL (ref 80.0–100.0)
MONO ABS: 0.2 10*3/uL (ref 0.2–0.9)
Monocytes Relative: 7 %
Neutro Abs: 2.3 10*3/uL (ref 1.4–6.5)
Neutrophils Relative %: 62 %
Platelets: 286 10*3/uL (ref 150–440)
RBC: 3.78 MIL/uL — AB (ref 3.80–5.20)
RDW: 15.5 % — AB (ref 11.5–14.5)
WBC: 3.6 10*3/uL (ref 3.6–11.0)

## 2014-07-16 MED ORDER — SODIUM CHLORIDE 0.9 % IJ SOLN
10.0000 mL | Freq: Once | INTRAMUSCULAR | Status: AC
  Administered 2014-07-16: 10 mL via INTRAVENOUS
  Filled 2014-07-16: qty 10

## 2014-07-16 MED ORDER — HEPARIN SOD (PORK) LOCK FLUSH 100 UNIT/ML IV SOLN
500.0000 [IU] | Freq: Once | INTRAVENOUS | Status: AC
  Administered 2014-07-16: 500 [IU] via INTRAVENOUS

## 2014-07-16 MED ORDER — HEPARIN SOD (PORK) LOCK FLUSH 100 UNIT/ML IV SOLN
INTRAVENOUS | Status: AC
  Filled 2014-07-16: qty 5

## 2014-07-16 NOTE — Progress Notes (Signed)
Patient does not have living will.  Former smoker. 

## 2014-07-16 NOTE — Progress Notes (Signed)
[place] @ [facility] Telephone:(336) [phone]  Fax:(336) [place] OB: [birth year]  MR#: [id number]  DPO#:[id number]  Patient Care Team: No Pcp Per Patient as PCP - General (General Practice) [name], MD (Unknown Physician Specialty) [name], MD (General Surgery)  CHIEF COMPLAINT:  Chief Complaint  Patient presents with  . Follow-up    Oncology History   1. 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  Biopsy from the lymph node in the left axilla positive for metastatic memory carcinoma Estrogen receptor positive Progesterone receptor +ve hER-2/neu receptors equivocal  by [facility]  IHC for HER-2/neu is 2+ Clinically staged asT4 D. N1 M0 tumor stage IV  locally advanced carcinoma.. 2. She was started on   [place] OF 2015. 3. Treatment was changed to Cytoxan and Adriamycinffrom August 31, 2013  4.patient has finished oral 3 cycles of chemotherapy with Cytoxan and Adriamycin on October 19, 2013. 5.Patient had a bilateral mastectomy in November of 2015. ypT4B ypN3 ypM1 STAGE iv DISEASE.. repeat HER-2/neu receptor by [facility] is still equivocal (November, 2015)     Breast cancer   11/13/2013 Initial Diagnosis Breast cancer    No flowsheet data found.  INTERVAL HISTORY 69 year old lady with bilateral carcinoma breast.  Lymphedema in left upper extremity.  On I Ibrance and letrozole for persistent disease after surgery. Here for further follow-up.  Tolerating   Ibrance well.  No bony pains. No shortness of breath.  Taking lymphedema therapy-seeing some improvement Left upper extremity lymphedema persist.  Tolerating IBRANCE left resolved  REVIEW OF SYSTEMS:   GENERAL:  Feels good.  Active.  No fevers, sweats or weight loss. PERFORMANCE STATUS (ECOG): 0 HEENT:  No visual changes, runny nose, sore throat, mouth sores or tenderness. Lungs:  No shortness of breath or cough.  No hemoptysis. Cardiac:  No chest pain, palpitations, orthopnea, or PND. GI:  No nausea, vomiting, diarrhea, constipation, melena or hematochezia. GU:  No urgency, frequency, dysuria, or hematuria. Musculoskeletal:  No back pain.  No joint pain.  No muscle tenderness. Extremities: lymph  Edema of left upper extremity Skin:  No rashes or skin changes. Neuro:  No headache, numbness or weakness, balance or coordination issues. Endocrine:  No diabetes, thyroid issues, hot flashes or night sweats. Psych:  No mood changes, depression or anxiety. Pain:  No focal pain. Review of systems:  All other systems reviewed and found to be negative.  As per HPI. Otherwise, a complete review of systems is negatve.  PAST MEDICAL HISTORY: Past Medical History  Diagnosis Date  . Cancer April 2015    left breast    PAST SURGICAL HISTORY: Past Surgical History  Procedure Laterality Date  . Shoulder open rotator cuff repair  2012  . Wrist surgery Left 1969    cyst  . Portacath placement  06-07-13  . Breast surgery Bilateral 11/19/13    bilater mastectomy     FAMILY HISTORY Family History  Problem Relation Age of Onset  . Adopted: Yes   Allergies:  Mushrooms: Hives  Shellfish: N/V/Diarrhea  Significant History/PMH:   Breast Cancer:    Hypertension:    Denies medical history:    :   :   Preventive Screening:  Has patient had any of the following test? Mammography  Pap Smear (1)   Last Mammography: april 2015(1)   Last Pap Smear: about 2 years  ago(1)   Smoking History: Smoking History quit several years ago(1)  PFSH: Comments: No family history of colorectal cancer, breast cancer, or ovarian cancer.  Social History: negative alcohol, negative tobacco  Additional Past Medical and Surgical History: no significant previous medical or surgical past history   GYNECOLOGIC HISTORY:  No LMP recorded. Patient is postmenopausal.     ADVANCED  DIRECTIVES:    HEALTH MAINTENANCE: History  Substance Use Topics  . Smoking status: Former Smoker -- 1.00 packs/day for 15 years    Types: Cigarettes    Quit date: 06/12/1985  . Smokeless tobacco: Never Used  . Alcohol Use: 0.0 oz/week    0 Standard drinks or equivalent per week     Allergies  Allergen Reactions  . Shellfish Allergy Nausea And Vomiting    Current Outpatient Prescriptions  Medication Sig Dispense Refill  . Calcium Carbonate-Vitamin D (CALCIUM + D PO) Take 1 tablet by mouth daily.    . Cholecalciferol (VITAMIN D PO) Take 5,000 Units by mouth daily.    [name] 125 MG capsule     . ibuprofen (ADVIL,MOTRIN) 200 MG tablet Take 200 mg by mouth every 6 (six) hours as needed.    Marland Kitchen letrozole (FEMARA) 2.5 MG tablet Take 2.5 mg by mouth daily. On hold    . ondansetron (ZOFRAN-ODT) 8 MG disintegrating tablet      No current facility-administered medications for this visit.    OBJECTIVE:  Filed Vitals:   07/16/14 1146  BP: 171/93  Pulse: 86  Temp: 97 F (36.1 C)     Body mass index is 35.31 kg/(m^2).    ECOG FS:0 - Asymptomatic  PHYSICAL EXAM: GENERAL:  Well developed, well nourished, sitting comfortably in the exam room in no acute distress. MENTAL STATUS:  Alert and oriented to person, place and time. HEAD:  Normocephalic, atraumatic, face symmetric, no Cushingoid features. EYES Pupils equal round and reactive to light and accomodation.  No conjunctivitis or scleral icterus. ENT:  Oropharynx clear without lesion.  Tongue normal. Mucous membranes moist.  RESPIRATORY:  Clear to auscultation without rales, wheezes or rhonchi. CARDIOVASCULAR:  Regular rate and rhythm without murmur, rub or gallop. BREAST:  Bilateral mastectomy.  Chest wall area no evidence of recurrent disease ABDOMEN:  Soft, non-tender, with active bowel sounds, and no hepatosplenomegaly.  No masses. BACK:  No CVA tenderness.  No tenderness on percussion of the back or rib cage. SKIN:  No  rashes, ulcers or lesions. EXTREMITIES: Left upper extremity lymphedema.  Getting better LYMPH NODES: No palpable cervical, supraclavicular, axillary or inguinal adenopathy  NEUROLOGICAL: Unremarkable. PSYCH:  Appropriate.   LAB RESULTS:  Infusion on 07/16/2014  Component Date Value Ref Range Status  . WBC 07/16/2014 3.6  3.6 - 11.0 K/uL Final   A-LINE DRAW  . RBC 07/16/2014 3.78* 3.80 - 5.20 MIL/uL Final  . Hemoglobin 07/16/2014 12.8  12.0 - 16.0 g/dL Final  . HCT 07/16/2014 37.6  35.0 - 47.0 % Final  . MCV 07/16/2014 99.4  80.0 - 100.0 fL Final  . MCH 07/16/2014 33.8  26.0 - 34.0 pg Final  . MCHC 07/16/2014 34.0  32.0 - 36.0 g/dL Final  . RDW 07/16/2014 15.5* 11.5 - 14.5 % Final  . Platelets 07/16/2014 286  150 - 440 K/uL Final  . Neutrophils Relative % 07/16/2014 62   Final  . Neutro Abs 07/16/2014 2.3  1.4 - 6.5 K/uL Final  . Lymphocytes Relative 07/16/2014 29   Final  . Lymphs Abs 07/16/2014 1.1  1.0 - 3.6 K/uL Final  . Monocytes Relative 07/16/2014 7   Final  . Monocytes Absolute 07/16/2014 0.2  0.2 - 0.9 K/uL Final  . Eosinophils Relative 07/16/2014 1   Final  . Eosinophils Absolute 07/16/2014 0.0  0 - 0.7 K/uL Final  . Basophils Relative 07/16/2014 1   Final  . Basophils Absolute 07/16/2014 0.0  0 - 0.1 K/uL Final  . Sodium 07/16/2014 135  135 - 145 mmol/L Final  . Potassium 07/16/2014 3.7  3.5 - 5.1 mmol/L Final  . Chloride 07/16/2014 103  101 - 111 mmol/L Final  . CO2 07/16/2014 25  22 - 32 mmol/L Final  . Glucose, Bld 07/16/2014 119* 65 - 99 mg/dL Final  . BUN 07/16/2014 21* 6 - 20 mg/dL Final  . Creatinine, Ser 07/16/2014 0.95  0.44 - 1.00 mg/dL Final  . Calcium 07/16/2014 8.9  8.9 - 10.3 mg/dL Final  . Total Protein 07/16/2014 7.4  6.5 - 8.1 g/dL Final  . Albumin 07/16/2014 4.0  3.5 - 5.0 g/dL Final  . AST 07/16/2014 24  15 - 41 U/L Final  . ALT 07/16/2014 31  14 - 54 U/L Final  . Alkaline Phosphatase 07/16/2014 81  38 - 126 U/L Final  . Total Bilirubin  07/16/2014 0.6  0.3 - 1.2 mg/dL Final  . GFR calc non Af Amer 07/16/2014 59* >60 mL/min Final  . GFR calc Af Amer 07/16/2014 >60  >60 mL/min Final   Comment: (NOTE) The eGFR has been calculated using the CKD EPI equation. This calculation has not been validated in all clinical situations. eGFR's persistently <60 mL/min signify possible Chronic Kidney Disease.   . Anion gap 07/16/2014 7  5 - 15 Final    Lab Results  Component Value Date   LABCA2 14.3 06/13/2014     STUDIES: No results found.  ASSESSMENT: 69 year old lady with history of carcinoma breast stage IV disease.  Presently on now Ibrance and letrozole therapy.  Tolerating treatment very well. Lymphedema   MEDICAL DECISION MAKING:  Continue anti-hormonal treatment with IBRANCE and letrozole During next appointment on bone density study will be done Continue lymphedema therapy Review of CA-27-29 shows declining trend.  Patient expressed understanding and was in agreement with this plan. She also understands that She can call clinic at any time with any questions, concerns, or complaints.    Breast cancer   Staging form: Breast, AJCC 7th Edition     Clinical: Stage IV (T3, N3, M1) - Signed by [name], MD on 06/14/2014   [name], MD   07/16/2014 7:34 PM

## 2014-07-17 LAB — CANCER ANTIGEN 27.29: CA 27.29: 14.9 U/mL (ref 0.0–38.6)

## 2014-08-06 ENCOUNTER — Encounter
Admission: RE | Admit: 2014-08-06 | Discharge: 2014-08-06 | Disposition: A | Discharge status: Home / Self Care | Payer: Medicare HMO | Source: Ambulatory Visit | Attending: Oncology | Admitting: Oncology

## 2014-08-06 DIAGNOSIS — C50919 Malignant neoplasm of unspecified site of unspecified female breast: Secondary | ICD-10-CM | POA: Insufficient documentation

## 2014-08-06 MED ORDER — TECHNETIUM TC 99M MEDRONATE IV KIT
22.8900 | PACK | Freq: Once | INTRAVENOUS | Status: AC | PRN
  Administered 2014-08-06: 22.89 via INTRAVENOUS

## 2014-08-07 ENCOUNTER — Ambulatory Visit: Payer: Medicare HMO

## 2014-08-07 ENCOUNTER — Inpatient Hospital Stay: Payer: Medicare HMO

## 2014-08-12 ENCOUNTER — Other Ambulatory Visit: Payer: Medicare HMO

## 2014-08-12 ENCOUNTER — Inpatient Hospital Stay: Payer: Medicare HMO

## 2014-08-12 ENCOUNTER — Ambulatory Visit
Admission: RE | Admit: 2014-08-12 | Discharge: 2014-08-12 | Disposition: A | Discharge status: Home / Self Care | Payer: Medicare HMO | Source: Ambulatory Visit | Attending: Oncology | Admitting: Oncology

## 2014-08-12 DIAGNOSIS — C50912 Malignant neoplasm of unspecified site of left female breast: Secondary | ICD-10-CM | POA: Insufficient documentation

## 2014-08-12 DIAGNOSIS — Z79811 Long term (current) use of aromatase inhibitors: Secondary | ICD-10-CM | POA: Diagnosis not present

## 2014-08-12 DIAGNOSIS — C50919 Malignant neoplasm of unspecified site of unspecified female breast: Secondary | ICD-10-CM | POA: Diagnosis present

## 2014-08-12 DIAGNOSIS — Z9221 Personal history of antineoplastic chemotherapy: Secondary | ICD-10-CM | POA: Insufficient documentation

## 2014-08-12 DIAGNOSIS — Z9013 Acquired absence of bilateral breasts and nipples: Secondary | ICD-10-CM | POA: Insufficient documentation

## 2014-08-12 DIAGNOSIS — C773 Secondary and unspecified malignant neoplasm of axilla and upper limb lymph nodes: Secondary | ICD-10-CM | POA: Diagnosis not present

## 2014-08-12 DIAGNOSIS — Z79899 Other long term (current) drug therapy: Secondary | ICD-10-CM | POA: Diagnosis not present

## 2014-08-12 DIAGNOSIS — Z170 Estrogen receptor positive status [ER+]: Secondary | ICD-10-CM | POA: Insufficient documentation

## 2014-08-12 DIAGNOSIS — I972 Postmastectomy lymphedema syndrome: Secondary | ICD-10-CM | POA: Insufficient documentation

## 2014-08-12 DIAGNOSIS — I10 Essential (primary) hypertension: Secondary | ICD-10-CM | POA: Diagnosis not present

## 2014-08-12 DIAGNOSIS — Z87891 Personal history of nicotine dependence: Secondary | ICD-10-CM | POA: Insufficient documentation

## 2014-08-12 LAB — CBC WITH DIFFERENTIAL/PLATELET
BASOS ABS: 0.1 10*3/uL (ref 0–0.1)
Basophils Relative: 1 %
EOS ABS: 0.1 10*3/uL (ref 0–0.7)
Eosinophils Relative: 2 %
HCT: 39.7 % (ref 35.0–47.0)
Hemoglobin: 13.5 g/dL (ref 12.0–16.0)
Lymphocytes Relative: 22 %
Lymphs Abs: 1 10*3/uL (ref 1.0–3.6)
MCH: 33.6 pg (ref 26.0–34.0)
MCHC: 33.9 g/dL (ref 32.0–36.0)
MCV: 99.3 fL (ref 80.0–100.0)
MONO ABS: 0.2 10*3/uL (ref 0.2–0.9)
Monocytes Relative: 5 %
NEUTROS ABS: 3.3 10*3/uL (ref 1.4–6.5)
Neutrophils Relative %: 70 %
Platelets: 323 10*3/uL (ref 150–440)
RBC: 4 MIL/uL (ref 3.80–5.20)
RDW: 15 % — AB (ref 11.5–14.5)
WBC: 4.7 10*3/uL (ref 3.6–11.0)

## 2014-08-12 LAB — COMPREHENSIVE METABOLIC PANEL
ALK PHOS: 78 U/L (ref 38–126)
ALT: 28 U/L (ref 14–54)
AST: 22 U/L (ref 15–41)
Albumin: 4.1 g/dL (ref 3.5–5.0)
Anion gap: 6 (ref 5–15)
BUN: 20 mg/dL (ref 6–20)
CO2: 27 mmol/L (ref 22–32)
CREATININE: 1.16 mg/dL — AB (ref 0.44–1.00)
Calcium: 9.4 mg/dL (ref 8.9–10.3)
Chloride: 106 mmol/L (ref 101–111)
GFR calc Af Amer: 54 mL/min — ABNORMAL LOW (ref >60)
GFR calc non Af Amer: 47 mL/min — ABNORMAL LOW (ref >60)
Glucose, Bld: 111 mg/dL — ABNORMAL HIGH (ref 65–99)
Potassium: 4.1 mmol/L (ref 3.5–5.1)
Sodium: 139 mmol/L (ref 135–145)
Total Bilirubin: 0.7 mg/dL (ref 0.3–1.2)
Total Protein: 7.6 g/dL (ref 6.5–8.1)

## 2014-08-12 MED ORDER — IOHEXOL 300 MG/ML  SOLN
75.0000 mL | Freq: Once | INTRAMUSCULAR | Status: AC | PRN
  Administered 2014-08-12: 75 mL via INTRAVENOUS

## 2014-08-13 ENCOUNTER — Inpatient Hospital Stay: Payer: Medicare HMO | Attending: Oncology | Admitting: Oncology

## 2014-08-13 ENCOUNTER — Encounter: Payer: Self-pay | Admitting: Oncology

## 2014-08-13 VITALS — BP 175/93 | HR 90 | Temp 97.3°F | Wt 227.1 lb

## 2014-08-13 DIAGNOSIS — Z170 Estrogen receptor positive status [ER+]: Secondary | ICD-10-CM

## 2014-08-13 DIAGNOSIS — Z9013 Acquired absence of bilateral breasts and nipples: Secondary | ICD-10-CM

## 2014-08-13 DIAGNOSIS — C50919 Malignant neoplasm of unspecified site of unspecified female breast: Secondary | ICD-10-CM

## 2014-08-13 DIAGNOSIS — Z9221 Personal history of antineoplastic chemotherapy: Secondary | ICD-10-CM | POA: Diagnosis not present

## 2014-08-13 DIAGNOSIS — I972 Postmastectomy lymphedema syndrome: Secondary | ICD-10-CM

## 2014-08-13 DIAGNOSIS — Z79899 Other long term (current) drug therapy: Secondary | ICD-10-CM

## 2014-08-13 DIAGNOSIS — C50912 Malignant neoplasm of unspecified site of left female breast: Secondary | ICD-10-CM | POA: Diagnosis not present

## 2014-08-13 DIAGNOSIS — Z87891 Personal history of nicotine dependence: Secondary | ICD-10-CM

## 2014-08-13 DIAGNOSIS — C773 Secondary and unspecified malignant neoplasm of axilla and upper limb lymph nodes: Secondary | ICD-10-CM

## 2014-08-13 DIAGNOSIS — I10 Essential (primary) hypertension: Secondary | ICD-10-CM

## 2014-08-13 DIAGNOSIS — Z79811 Long term (current) use of aromatase inhibitors: Secondary | ICD-10-CM

## 2014-08-13 LAB — CANCER ANTIGEN 27.29: CA 27.29: 16.9 U/mL (ref 0.0–38.6)

## 2014-08-13 NOTE — Progress Notes (Signed)
Patient does not have living will.  Former smoker. 

## 2014-08-13 NOTE — Progress Notes (Signed)
[place] @ [facility] [phone]  Fax:(336) [place] OB: 1946-01-08  MR#: [id number]  DPO#:[id number]  Patient Care Team: No Pcp Per Patient as PCP - General (General Practice) [name], MD (Unknown Physician Specialty) [name], MD (General Surgery)  CHIEF COMPLAINT:  Chief Complaint  Patient presents with  . Follow-up    Oncology History   1. 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  Biopsy from the lymph node in the left axilla positive for metastatic memory carcinoma Estrogen receptor positive Progesterone receptor +ve hER-2/neu receptors equivocal  by Fish  IHC for HER-2/neu is 2+ Clinically staged asT4 D. N1 M0 tumor stage IV  locally advanced carcinoma.. 2. She was started on   [place] OF 2015. 3. Treatment was changed to Cytoxan and Adriamycinffrom August 31, 2013  4.patient has finished oral 3 cycles of chemotherapy with Cytoxan and Adriamycin on October 19, 2013. 5.Patient had a bilateral mastectomy in November of 2015. ypT4B ypN3 ypM1 STAGE iv DISEASE.. repeat HER-2/neu receptor by [facility] is still equivocal (November, 2015).  6.  Patient is on letrozole and IBRANCE     Breast cancer   11/13/2013 Initial Diagnosis Breast cancer    No flowsheet data found.  INTERVAL HISTORY 69 year old lady with bilateral carcinoma breast.  Lymphedema in left upper extremity.  On I Ibrance and letrozole for persistent disease after surgery. Here for further follow-up.  Tolerating   Ibrance well.  No bony pains. No shortness of breath.  Taking lymphedema therapy-seeing some improvement Left upper extremity lymphedema persist.  Tolerating [name] Sunny Schlein SHE had a CT scan of chest and upper abdomen.  If edema of left upper extremity is improving  REVIEW OF SYSTEMS:   GENERAL:  Feels good.  Active.  No fevers,  sweats or weight loss. PERFORMANCE STATUS (ECOG): 0 HEENT:  No visual changes, runny nose, sore throat, mouth sores or tenderness. Lungs: No shortness of breath or cough.  No hemoptysis. Cardiac:  No chest pain, palpitations, orthopnea, or PND. GI:  No nausea, vomiting, diarrhea, constipation, melena or hematochezia. GU:  No urgency, frequency, dysuria, or hematuria. Musculoskeletal:  No back pain.  No joint pain.  No muscle tenderness. Extremities: lymph  Edema of left upper extremity Skin:  No rashes or skin changes. Neuro:  No headache, numbness or weakness, balance or coordination issues. Endocrine:  No diabetes, thyroid issues, hot flashes or night sweats. Psych:  No mood changes, depression or anxiety. Pain:  No focal pain. Review of systems:  All other systems reviewed and found to be negative.  As per HPI. Otherwise, a complete review of systems is negatve.  PAST MEDICAL HISTORY: Past Medical History  Diagnosis Date  . Cancer April 2015    left breast    PAST SURGICAL HISTORY: Past Surgical History  Procedure Laterality Date  . Shoulder open rotator cuff repair  2012  . Wrist surgery Left 1969    cyst  . Portacath placement  06-07-13  . Breast surgery Bilateral 11/19/13    bilater mastectomy     FAMILY HISTORY Family History  Problem Relation Age of Onset  . Adopted: Yes   Allergies:  Mushrooms: Hives  Shellfish: N/V/Diarrhea  Significant History/PMH:   Breast Cancer:    Hypertension:    Denies medical history:    :   :   Preventive Screening:  Has patient had any of the following test? Mammography  Pap Smear (1)   Last Mammography: april 2015(1)   Last Pap Smear: about 2 years ago(1)   Smoking History: Smoking History quit several years ago(1)  PFSH: Comments: No family history of colorectal cancer, breast cancer, or ovarian cancer.  Social History: negative alcohol, negative tobacco  Additional Past Medical and Surgical History: no  significant previous medical or surgical past history   GYNECOLOGIC HISTORY:  No LMP recorded. Patient is postmenopausal.     ADVANCED DIRECTIVES:  Patient does not have any living will or healthcare power of attorney.  Information was given .  Available resources had been discussed.  We will follow-up on subsequent appointments regarding this issue  HEALTH MAINTENANCE: History  Substance Use Topics  . Smoking status: Former Smoker -- 1.00 packs/day for 15 years    Types: Cigarettes    Quit date: 06/12/1985  . Smokeless tobacco: Never Used  . Alcohol Use: 0.0 oz/week    0 Standard drinks or equivalent per week     Allergies  Allergen Reactions  . Mushroom Extract Complex Hives and Swelling  . Shellfish Allergy Nausea And Vomiting    Current Outpatient Prescriptions  Medication Sig Dispense Refill  . Calcium Carbonate-Vitamin D (CALCIUM + D PO) Take 1 tablet by mouth daily.    . Cholecalciferol (VITAMIN D PO) Take 5,000 Units by mouth daily.    [name] 125 MG capsule     . ibuprofen (ADVIL,MOTRIN) 200 MG tablet Take 200 mg by mouth every 6 (six) hours as needed.    Marland Kitchen letrozole (FEMARA) 2.5 MG tablet Take 2.5 mg by mouth daily. On hold    . ondansetron (ZOFRAN-ODT) 8 MG disintegrating tablet      No current facility-administered medications for this visit.    OBJECTIVE:  Filed Vitals:   08/13/14 1150  BP: 175/93  Pulse: 90  Temp: 97.3 F (36.3 C)     Body mass index is 35.56 kg/(m^2).    ECOG FS:0 - Asymptomatic  PHYSICAL EXAM: GENERAL:  Well developed, well nourished, sitting comfortably in the exam room in no acute distress. MENTAL STATUS:  Alert and oriented to person, place and time. HEAD:  Normocephalic, atraumatic, face symmetric, no Cushingoid features. EYES Pupils equal round and reactive to light and accomodation.  No conjunctivitis or scleral icterus. ENT:  Oropharynx clear without lesion.  Tongue normal. Mucous membranes moist.  RESPIRATORY:  Clear to  auscultation without rales, wheezes or rhonchi. CARDIOVASCULAR:  Regular rate and rhythm without murmur, rub or gallop. BREAST:  Bilateral mastectomy.  Chest wall area no evidence of recurrent disease ABDOMEN:  Soft, non-tender, with active bowel sounds, and no hepatosplenomegaly.  No masses. BACK:  No CVA tenderness.  No tenderness on percussion of the back or rib cage. SKIN:  No rashes, ulcers or lesions. EXTREMITIES: Left upper extremity lymphedema.  Getting better LYMPH NODES: No palpable cervical, supraclavicular, axillary or inguinal adenopathy  NEUROLOGICAL: Unremarkable. PSYCH:  Appropriate.   LAB RESULTS:  Appointment on 08/12/2014  Component Date Value Ref Range Status  . WBC 08/12/2014 4.7  3.6 - 11.0 K/uL Final  . RBC 08/12/2014 4.00  3.80 - 5.20 MIL/uL Final  . Hemoglobin 08/12/2014 13.5  12.0 - 16.0 g/dL Final  . HCT 08/12/2014 39.7  35.0 - 47.0 % Final  . MCV 08/12/2014 99.3  80.0 - 100.0 fL Final  . MCH 08/12/2014 33.6  26.0 - 34.0 pg Final  . MCHC 08/12/2014 33.9  32.0 - 36.0 g/dL Final  . RDW 08/12/2014 15.0* 11.5 - 14.5 % Final  . Platelets 08/12/2014 323  150 - 440 K/uL Final  . Neutrophils Relative % 08/12/2014 70   Final  . Neutro Abs 08/12/2014 3.3  1.4 - 6.5 K/uL Final  . Lymphocytes Relative 08/12/2014 22   Final  . Lymphs Abs 08/12/2014 1.0  1.0 - 3.6 K/uL Final  . Monocytes Relative 08/12/2014 5   Final  . Monocytes Absolute 08/12/2014 0.2  0.2 - 0.9 K/uL Final  . Eosinophils Relative 08/12/2014 2   Final  . Eosinophils Absolute 08/12/2014 0.1  0 - 0.7 K/uL Final  . Basophils Relative 08/12/2014 1   Final  . Basophils Absolute 08/12/2014 0.1  0 - 0.1 K/uL Final  . Sodium 08/12/2014 139  135 - 145 mmol/L Final  . Potassium 08/12/2014 4.1  3.5 - 5.1 mmol/L Final  . Chloride 08/12/2014 106  101 - 111 mmol/L Final  . CO2 08/12/2014 27  22 - 32 mmol/L Final  . Glucose, Bld 08/12/2014 111* 65 - 99 mg/dL Final  . BUN 08/12/2014 20  6 - 20 mg/dL Final  .  Creatinine, Ser 08/12/2014 1.16* 0.44 - 1.00 mg/dL Final  . Calcium 08/12/2014 9.4  8.9 - 10.3 mg/dL Final  . Total Protein 08/12/2014 7.6  6.5 - 8.1 g/dL Final  . Albumin 08/12/2014 4.1  3.5 - 5.0 g/dL Final  . AST 08/12/2014 22  15 - 41 U/L Final  . ALT 08/12/2014 28  14 - 54 U/L Final  . Alkaline Phosphatase 08/12/2014 78  38 - 126 U/L Final  . Total Bilirubin 08/12/2014 0.7  0.3 - 1.2 mg/dL Final  . GFR calc non Af Amer 08/12/2014 47* >60 mL/min Final  . GFR calc Af Amer 08/12/2014 54* >60 mL/min Final   Comment: (NOTE) The eGFR has been calculated using the CKD EPI equation. This calculation has not been validated in all clinical situations. eGFR's persistently <60 mL/min signify possible Chronic Kidney Disease.   . Anion gap 08/12/2014 6  5 - 15 Final  . CA 27.29 08/12/2014 16.9  0.0 - 38.6 U/mL Final   Comment: (NOTE) [employer] [facility]/ACS methodology Performed At: [facility] [place], [place] [id number] Lindon Romp MD YI:[id number]     Lab Results  Component Value Date   LABCA2 16.9 08/12/2014     STUDIES: Ct Chest W Contrast  08/12/2014   CLINICAL DATA:  Subsequent treatment strategy for breast carcinoma.  EXAM: CT CHEST WITH CONTRAST  TECHNIQUE: Multidetector CT imaging of the chest was performed during intravenous contrast administration.  CONTRAST:  60mL OMNIPAQUE IOHEXOL 300 MG/ML  SOLN  COMPARISON:  11/08/2013  FINDINGS: Mediastinum/Nodes: No axillary lymphadenopathy. Bilateral mastectomy anatomy. Port in the right chest.  No supraclavicular or mediastinal lymphadenopathy. No pericardial fluid. Esophagus is normal.  Lungs/Pleura: Mild branching nodularity in the right middle lobe on images 30 through 27 appears benign  Upper abdomen: Limited view of the liver, kidneys, pancreas are unremarkable. Normal adrenal glands.  Musculoskeletal: No aggressive osseous lesion.  IMPRESSION: No evidence of breast cancer recurrence.   Electronically Signed    By: Suzy Bouchard M.D.   On: 08/12/2014 13:49   Nm Bone Scan Whole Body  08/06/2014   CLINICAL DATA:  History of left breast malignancy on chemotherapy no current bony abnormality; history of tibia and fibula fracture on the left in 2003, left rotator cuff surgery in 2012.  EXAM: NUCLEAR MEDICINE WHOLE  BODY BONE SCAN  TECHNIQUE: Whole body anterior and posterior images were obtained approximately 3 hours after intravenous injection of radiopharmaceutical.  RADIOPHARMACEUTICALS:  22.89 mCi Technetium-48mMDP IV  COMPARISON:  Nuclear bone scan dated November 06, 2013  FINDINGS: There is adequate uptake of the radiopharmaceutical by the skeleton. Adequate soft tissue clearance and renal activity is demonstrated.  There is stable subtle increased uptake on the left posteriorly at L3. Uptake in the calvarium and spine is normal elsewhere. Uptake in the ribs and visualized portions of the upper extremities is normal. Uptake in the pelvis and lower extremities exhibits no suspicious findings. There is increased uptake in the feet and ankles which is stable and consistent with degenerative change.  IMPRESSION: There are no findings to suggest osseous metastatic disease. Areas of mildly increased uptake described above are compatible with degenerative change.   Electronically Signed   By: David  JMartiniqueM.D.   On: 08/06/2014 11:39    ASSESSMENT: 69year old lady with history of carcinoma breast stage IV disease.  Presently on now Ibrance and letrozole therapy.  Tolerating treatment very well. Lymphedema CAT  scan of the chest for reassessment of the disease has been reviewed independently and reviewed with the patient  MEDICAL DECISION MAKING:  Continue anti-hormonal treatment with IBRANCE and letrozole During next appointment on bone density study will be done Continue lymphedema therapy Review of CA-27-29 shows declining trend.  It is no evidence of recurrent or progressive disease. Continue  follow-up   Breast cancer   Staging form: Breast, AJCC 7th Edition     Clinical: Stage IV (T3, N3, M1) - Signed by JForest Gleason MD on 06/14/2014   JForest Gleason MD   08/13/2014 1:10 PM

## 2014-09-12 ENCOUNTER — Inpatient Hospital Stay (HOSPITAL_BASED_OUTPATIENT_CLINIC_OR_DEPARTMENT_OTHER): Payer: Medicare HMO | Admitting: Oncology

## 2014-09-12 ENCOUNTER — Encounter: Payer: Self-pay | Admitting: Oncology

## 2014-09-12 ENCOUNTER — Inpatient Hospital Stay: Payer: Medicare HMO | Attending: Oncology

## 2014-09-12 ENCOUNTER — Inpatient Hospital Stay: Payer: Medicare HMO

## 2014-09-12 VITALS — BP 185/111 | HR 79 | Temp 97.1°F | Wt 225.3 lb

## 2014-09-12 DIAGNOSIS — I972 Postmastectomy lymphedema syndrome: Secondary | ICD-10-CM | POA: Insufficient documentation

## 2014-09-12 DIAGNOSIS — Z87891 Personal history of nicotine dependence: Secondary | ICD-10-CM

## 2014-09-12 DIAGNOSIS — Z79899 Other long term (current) drug therapy: Secondary | ICD-10-CM | POA: Diagnosis not present

## 2014-09-12 DIAGNOSIS — Z9221 Personal history of antineoplastic chemotherapy: Secondary | ICD-10-CM

## 2014-09-12 DIAGNOSIS — Z9013 Acquired absence of bilateral breasts and nipples: Secondary | ICD-10-CM | POA: Insufficient documentation

## 2014-09-12 DIAGNOSIS — L658 Other specified nonscarring hair loss: Secondary | ICD-10-CM | POA: Insufficient documentation

## 2014-09-12 DIAGNOSIS — I10 Essential (primary) hypertension: Secondary | ICD-10-CM | POA: Insufficient documentation

## 2014-09-12 DIAGNOSIS — Z79811 Long term (current) use of aromatase inhibitors: Secondary | ICD-10-CM | POA: Diagnosis not present

## 2014-09-12 DIAGNOSIS — C773 Secondary and unspecified malignant neoplasm of axilla and upper limb lymph nodes: Secondary | ICD-10-CM | POA: Insufficient documentation

## 2014-09-12 DIAGNOSIS — Z170 Estrogen receptor positive status [ER+]: Secondary | ICD-10-CM | POA: Insufficient documentation

## 2014-09-12 DIAGNOSIS — C50912 Malignant neoplasm of unspecified site of left female breast: Secondary | ICD-10-CM

## 2014-09-12 DIAGNOSIS — C50919 Malignant neoplasm of unspecified site of unspecified female breast: Secondary | ICD-10-CM

## 2014-09-12 LAB — COMPREHENSIVE METABOLIC PANEL
ALBUMIN: 4 g/dL (ref 3.5–5.0)
ALK PHOS: 74 U/L (ref 38–126)
ALT: 29 U/L (ref 14–54)
AST: 23 U/L (ref 15–41)
Anion gap: 5 (ref 5–15)
BILIRUBIN TOTAL: 0.7 mg/dL (ref 0.3–1.2)
BUN: 21 mg/dL — AB (ref 6–20)
CO2: 26 mmol/L (ref 22–32)
Calcium: 8.9 mg/dL (ref 8.9–10.3)
Chloride: 104 mmol/L (ref 101–111)
Creatinine, Ser: 1.18 mg/dL — ABNORMAL HIGH (ref 0.44–1.00)
GFR calc Af Amer: 53 mL/min — ABNORMAL LOW (ref >60)
GFR calc non Af Amer: 46 mL/min — ABNORMAL LOW (ref >60)
GLUCOSE: 99 mg/dL (ref 65–99)
POTASSIUM: 3.9 mmol/L (ref 3.5–5.1)
SODIUM: 135 mmol/L (ref 135–145)
TOTAL PROTEIN: 7.1 g/dL (ref 6.5–8.1)

## 2014-09-12 LAB — CBC WITH DIFFERENTIAL/PLATELET
Basophils Absolute: 0 10*3/uL (ref 0–0.1)
Basophils Relative: 2 %
Eosinophils Absolute: 0 10*3/uL (ref 0–0.7)
Eosinophils Relative: 2 %
HCT: 36.3 % (ref 35.0–47.0)
HEMOGLOBIN: 12.6 g/dL (ref 12.0–16.0)
LYMPHS ABS: 0.8 10*3/uL — AB (ref 1.0–3.6)
Lymphocytes Relative: 27 %
MCH: 33.9 pg (ref 26.0–34.0)
MCHC: 34.7 g/dL (ref 32.0–36.0)
MCV: 97.7 fL (ref 80.0–100.0)
Monocytes Absolute: 0.2 10*3/uL (ref 0.2–0.9)
Monocytes Relative: 8 %
NEUTROS PCT: 61 %
Neutro Abs: 1.9 10*3/uL (ref 1.4–6.5)
PLATELETS: 242 10*3/uL (ref 150–440)
RBC: 3.71 MIL/uL — AB (ref 3.80–5.20)
RDW: 14.7 % — ABNORMAL HIGH (ref 11.5–14.5)
WBC: 3 10*3/uL — AB (ref 3.6–11.0)

## 2014-09-12 MED ORDER — HEPARIN SOD (PORK) LOCK FLUSH 100 UNIT/ML IV SOLN
500.0000 [IU] | Freq: Once | INTRAVENOUS | Status: AC
  Administered 2014-09-12: 500 [IU] via INTRAVENOUS

## 2014-09-12 MED ORDER — SODIUM CHLORIDE 0.9 % IJ SOLN
10.0000 mL | INTRAMUSCULAR | Status: AC | PRN
  Administered 2014-09-12: 10 mL via INTRAVENOUS
  Filled 2014-09-12: qty 10

## 2014-09-12 MED ORDER — HEPARIN SOD (PORK) LOCK FLUSH 100 UNIT/ML IV SOLN
INTRAVENOUS | Status: AC
  Filled 2014-09-12: qty 5

## 2014-09-12 NOTE — Progress Notes (Signed)
Patient does not have living will.  Former smoker.  Patient complains of losing a lot of hair which she noticed this week.  Also has a hard, moveable, nodule on her left ring finger that came up about 2 weeks ago.

## 2014-09-13 NOTE — Progress Notes (Signed)
[place] @ [facility] Telephone:(336) [phone]  Fax:(336) [place] OB: 05-Sep-1945  MR#: [id number]  HWE#:[id number]  Patient Care Team: No Pcp Per Patient as PCP - General (General Practice) [name], MD (Unknown Physician Specialty) [name], MD (General Surgery)  CHIEF COMPLAINT:  Chief Complaint  Patient presents with  . Follow-up    Oncology History   1. 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  Biopsy from the lymph node in the left axilla positive for metastatic memory carcinoma Estrogen receptor positive Progesterone receptor +ve hER-2/neu receptors equivocal  by Fish  IHC for HER-2/neu is 2+ Clinically staged asT4 D. N1 M0 tumor stage IV  locally advanced carcinoma.. 2. She was started on   [place] OF 2015. 3. Treatment was changed to Cytoxan and Adriamycinffrom August 31, 2013  4.patient has finished oral 3 cycles of chemotherapy with Cytoxan and Adriamycin on October 19, 2013. 5.Patient had a bilateral mastectomy in November of 2015. ypT4B ypN3 ypM1 STAGE iv DISEASE.. repeat HER-2/neu receptor by [facility] is still equivocal (November, 2015).  6.  Patient is on letrozole and IBRANCE     Breast cancer   11/13/2013 Initial Diagnosis Breast cancer    No flowsheet data found.  INTERVAL HISTORY 69 year old lady with bilateral carcinoma breast.  Lymphedema in left upper extremity.  On I Ibrance   Letrozole.  Left upper extremity edema improving.  Patient is concerned about losing hair.  Patient's blood pressure has been noted to be very high.  It does not appear to be on any antihypertensive medication.  No bony pains appetite has been stable  REVIEW OF SYSTEMS:   GENERAL:  Feels good.  Active.  No fevers, sweats or weight loss. PERFORMANCE STATUS (ECOG): 0 HEENT:  No visual changes, runny nose, sore throat, mouth sores  or tenderness. Lungs: No shortness of breath or cough.  No hemoptysis. Cardiac:  No chest pain, palpitations, orthopnea, or PND. GI:  No nausea, vomiting, diarrhea, constipation, melena or hematochezia. GU:  No urgency, frequency, dysuria, or hematuria. Musculoskeletal:  No back pain.  No joint pain.  No muscle tenderness. Extremities: lymph  Edema of left upper extremity Skin:  No rashes or skin changes. Neuro:  No headache, numbness or weakness, balance or coordination issues. Endocrine:  No diabetes, thyroid issues, hot flashes or night sweats. Psych:  No mood changes, depression or anxiety. Pain:  No focal pain. Review of systems:  All other systems reviewed and found to be negative.  As per HPI. Otherwise, a complete review of systems is negatve.  PAST MEDICAL HISTORY: Past Medical History  Diagnosis Date  . Cancer April 2015    left breast    PAST SURGICAL HISTORY: Past Surgical History  Procedure Laterality Date  . Shoulder open rotator cuff repair  2012  . Wrist surgery Left 1969    cyst  . Portacath placement  06-07-13  . Breast surgery Bilateral 11/19/13    bilater mastectomy     FAMILY HISTORY Family History  Problem Relation Age of Onset  . Adopted: Yes   Allergies:  Mushrooms: Hives  Shellfish: N/V/Diarrhea  Significant History/PMH:   Breast Cancer:    Hypertension:    Denies medical history:    :   :   Preventive Screening:  Has patient had any of the following test? Mammography  Pap Smear (1)  Last Mammography: april 2015(1)   Last Pap Smear: about 2 years ago(1)   Smoking History: Smoking History quit several years ago(1)  PFSH: Comments: No family history of colorectal cancer, breast cancer, or ovarian cancer.  Social History: negative alcohol, negative tobacco  Additional Past Medical and Surgical History: no significant previous medical or surgical past history   GYNECOLOGIC HISTORY:  No LMP recorded. Patient is postmenopausal.       ADVANCED DIRECTIVES:  Patient does not have any living will or healthcare power of attorney.  Information was given .  Available resources had been discussed.  We will follow-up on subsequent appointments regarding this issue  HEALTH MAINTENANCE: Social History  Substance Use Topics  . Smoking status: Former Smoker -- 1.00 packs/day for 15 years    Types: Cigarettes    Quit date: 06/12/1985  . Smokeless tobacco: Never Used  . Alcohol Use: 0.0 oz/week    0 Standard drinks or equivalent per week     Allergies  Allergen Reactions  . Mushroom Extract Complex Hives and Swelling  . Shellfish Allergy Nausea And Vomiting    Current Outpatient Prescriptions  Medication Sig Dispense Refill  . Calcium Carbonate-Vitamin D (CALCIUM + D PO) Take 1 tablet by mouth daily.    . Cholecalciferol (VITAMIN D PO) Take 5,000 Units by mouth daily.    [name] 125 MG capsule     . ibuprofen (ADVIL,MOTRIN) 200 MG tablet Take 200 mg by mouth every 6 (six) hours as needed.    Marland Kitchen letrozole (FEMARA) 2.5 MG tablet Take 2.5 mg by mouth daily. On hold    . ondansetron (ZOFRAN-ODT) 8 MG disintegrating tablet      No current facility-administered medications for this visit.   Facility-Administered Medications Ordered in Other Visits  Medication Dose Route Frequency Provider Last Rate Last Dose  . sodium chloride 0.9 % injection 10 mL  10 mL Intravenous PRN [name], MD   10 mL at 09/12/14 1014    OBJECTIVE:  Filed Vitals:   09/12/14 1044  BP: 185/111  Pulse: 79  Temp: 97.1 F (36.2 C)     Body mass index is 35.28 kg/(m^2).    ECOG FS:0 - Asymptomatic  PHYSICAL EXAM: GENERAL:  Well developed, well nourished, sitting comfortably in the exam room in no acute distress. MENTAL STATUS:  Alert and oriented to person, place and time. HEAD:  Normocephalic, atraumatic, face symmetric, no Cushingoid features. EYES Pupils equal round and reactive to light and accomodation.  No conjunctivitis or  scleral icterus. ENT:  Oropharynx clear without lesion.  Tongue normal. Mucous membranes moist.  RESPIRATORY:  Clear to auscultation without rales, wheezes or rhonchi. CARDIOVASCULAR:  Regular rate and rhythm without murmur, rub or gallop. BREAST:  Bilateral mastectomy.  Chest wall area no evidence of recurrent disease ABDOMEN:  Soft, non-tender, with active bowel sounds, and no hepatosplenomegaly.  No masses. BACK:  No CVA tenderness.  No tenderness on percussion of the back or rib cage. SKIN:  No rashes, ulcers or lesions. EXTREMITIES: Left upper extremity lymphedema.  Getting better LYMPH NODES: No palpable cervical, supraclavicular, axillary or inguinal adenopathy  NEUROLOGICAL: Unremarkable. PSYCH:  Appropriate.   LAB RESULTS:  Infusion on 09/12/2014  Component Date Value Ref Range Status  . WBC 09/12/2014 3.0* 3.6 - 11.0 K/uL Final   A-LINE DRAW  . RBC 09/12/2014 3.71* 3.80 - 5.20 MIL/uL Final  . Hemoglobin 09/12/2014 12.6  12.0 - 16.0 g/dL Final  . HCT 09/12/2014 36.3  35.0 -  47.0 % Final  . MCV 09/12/2014 97.7  80.0 - 100.0 fL Final  . MCH 09/12/2014 33.9  26.0 - 34.0 pg Final  . MCHC 09/12/2014 34.7  32.0 - 36.0 g/dL Final  . RDW 09/12/2014 14.7* 11.5 - 14.5 % Final  . Platelets 09/12/2014 242  150 - 440 K/uL Final  . Neutrophils Relative % 09/12/2014 61   Final  . Neutro Abs 09/12/2014 1.9  1.4 - 6.5 K/uL Final  . Lymphocytes Relative 09/12/2014 27   Final  . Lymphs Abs 09/12/2014 0.8* 1.0 - 3.6 K/uL Final  . Monocytes Relative 09/12/2014 8   Final  . Monocytes Absolute 09/12/2014 0.2  0.2 - 0.9 K/uL Final  . Eosinophils Relative 09/12/2014 2   Final  . Eosinophils Absolute 09/12/2014 0.0  0 - 0.7 K/uL Final  . Basophils Relative 09/12/2014 2   Final  . Basophils Absolute 09/12/2014 0.0  0 - 0.1 K/uL Final  . Sodium 09/12/2014 135  135 - 145 mmol/L Final  . Potassium 09/12/2014 3.9  3.5 - 5.1 mmol/L Final  . Chloride 09/12/2014 104  101 - 111 mmol/L Final  . CO2  09/12/2014 26  22 - 32 mmol/L Final  . Glucose, Bld 09/12/2014 99  65 - 99 mg/dL Final  . BUN 09/12/2014 21* 6 - 20 mg/dL Final  . Creatinine, Ser 09/12/2014 1.18* 0.44 - 1.00 mg/dL Final  . Calcium 09/12/2014 8.9  8.9 - 10.3 mg/dL Final  . Total Protein 09/12/2014 7.1  6.5 - 8.1 g/dL Final  . Albumin 09/12/2014 4.0  3.5 - 5.0 g/dL Final  . AST 09/12/2014 23  15 - 41 U/L Final  . ALT 09/12/2014 29  14 - 54 U/L Final  . Alkaline Phosphatase 09/12/2014 74  38 - 126 U/L Final  . Total Bilirubin 09/12/2014 0.7  0.3 - 1.2 mg/dL Final  . GFR calc non Af Amer 09/12/2014 46* >60 mL/min Final  . GFR calc Af Amer 09/12/2014 53* >60 mL/min Final   Comment: (NOTE) The eGFR has been calculated using the CKD EPI equation. This calculation has not been validated in all clinical situations. eGFR's persistently <60 mL/min signify possible Chronic Kidney Disease.   . Anion gap 09/12/2014 5  5 - 15 Final    Lab Results  Component Value Date   LABCA2 16.9 08/12/2014     STUDIES: No results found.  ASSESSMENT: 69 year old lady with history of carcinoma breast stage IV disease.  Presently on now Ibrance and letrozole therapy.  Tolerating treatment very well. Patient is continuing with letrozole and IBRANCE MEDICAL DECISION MAKING:  Continue anti-hormonal treatment with IBRANCE and letrozole Systolic and diastolic blood pressure is slightly elevated will be the checked and patient be referred to primary care physician a pleasant blood pressure continues to be high. Continue follow-up   Breast cancer   Staging form: Breast, AJCC 7th Edition     Clinical: Stage IV (T3, N3, M1) - Signed by [name], MD on 06/14/2014   [name], MD   09/13/2014 8:43 AM

## 2014-09-17 ENCOUNTER — Ambulatory Visit: Payer: Medicare HMO | Admitting: General Surgery

## 2014-09-17 ENCOUNTER — Telehealth: Payer: Self-pay | Admitting: *Deleted

## 2014-09-17 DIAGNOSIS — C50919 Malignant neoplasm of unspecified site of unspecified female breast: Secondary | ICD-10-CM

## 2014-09-17 MED ORDER — LISINOPRIL 10 MG PO TABS: 10.0000 mg | ORAL_TABLET | Freq: Every day | ORAL | Status: DC

## 2014-09-17 NOTE — Telephone Encounter (Signed)
Called patient to inform her that Dr. Oliva Bustard wants to start her on BP medication.  Lisinopril 10 mg has been e-scribed to her pharmacy.  Patient verbalized understanding.

## 2014-09-17 NOTE — Telephone Encounter (Signed)
Lisinopril 10mg  daily escribed to pharmacy.

## 2014-09-17 NOTE — Telephone Encounter (Signed)
-----   Message from [name], MD sent at 09/13/2014  8:45 AM EDT ----- Regarding: Faith [name] Patient's blood pressure during yesterday visit was very high.  He needs to be rechecked if continues to be high patient is to go and see primary care physician or we can start patient on lisinopril 10 mg by mouth daily=30 tablets

## 2014-09-19 ENCOUNTER — Encounter: Payer: Self-pay | Admitting: General Surgery

## 2014-09-19 ENCOUNTER — Ambulatory Visit (INDEPENDENT_AMBULATORY_CARE_PROVIDER_SITE_OTHER): Payer: Medicare HMO | Admitting: General Surgery

## 2014-09-19 VITALS — BP 158/74 | HR 90 | Resp 14 | Ht 67.0 in | Wt 228.0 lb

## 2014-09-19 DIAGNOSIS — I890 Lymphedema, not elsewhere classified: Secondary | ICD-10-CM

## 2014-09-19 DIAGNOSIS — C50912 Malignant neoplasm of unspecified site of left female breast: Secondary | ICD-10-CM

## 2014-09-19 NOTE — Progress Notes (Signed)
Patient ID: Faith [name], female   DOB: 1945/05/04, 69 y.o.   MRN: [id number]  Chief Complaint  Patient presents with  . Follow-up    left breast cancer    HPI TOMEEKA [name] is a 69 y.o. female here for follow up of left breast cancer and lymphedema of the left arm. She reports that she is doing well. She was recently placed on Lisinopril for hypertension. She states that the left arm lymphedema is stable.  HPI  Past Medical History  Diagnosis Date  . Cancer April 2015    left breast    Past Surgical History  Procedure Laterality Date  . Shoulder open rotator cuff repair  2012  . Wrist surgery Left 1969    cyst  . Portacath placement  06-07-13  . Breast surgery Bilateral 11/19/13    bilater mastectomy     Family History  Problem Relation Age of Onset  . Adopted: Yes    Social History Social History  Substance Use Topics  . Smoking status: Former Smoker -- 1.00 packs/day for 15 years    Types: Cigarettes    Quit date: 06/12/1985  . Smokeless tobacco: Never Used  . Alcohol Use: 0.0 oz/week    0 Standard drinks or equivalent per week    Allergies  Allergen Reactions  . Mushroom Extract Complex Hives and Swelling  . Shellfish Allergy Nausea And Vomiting    Current Outpatient Prescriptions  Medication Sig Dispense Refill  . Calcium Carbonate-Vitamin D (CALCIUM + D PO) Take 1 tablet by mouth daily.    . Cholecalciferol (VITAMIN D PO) Take 5,000 Units by mouth daily.    [name] 125 MG capsule     . ibuprofen (ADVIL,MOTRIN) 200 MG tablet Take 200 mg by mouth every 6 (six) hours as needed.    Marland Kitchen letrozole (FEMARA) 2.5 MG tablet Take 2.5 mg by mouth daily. On hold    . lisinopril (PRINIVIL,ZESTRIL) 10 MG tablet Take 1 tablet (10 mg total) by mouth daily. 30 tablet 3  . ondansetron (ZOFRAN-ODT) 8 MG disintegrating tablet      No current facility-administered medications for this visit.   Facility-Administered Medications Ordered in Other Visits  Medication Dose Route  Frequency Provider Last Rate Last Dose  . sodium chloride 0.9 % injection 10 mL  10 mL Intravenous PRN [name], MD   10 mL at 09/12/14 1014    Review of Systems Review of Systems  Constitutional: Negative.   Respiratory: Negative.   Cardiovascular: Negative.     Blood pressure 158/74, pulse 90, resp. rate 14, height 5\' 7"  (1.702 m), weight 228 lb (103.42 kg).  Physical Exam Physical Exam  Constitutional: She is oriented to person, place, and time. She appears well-developed and well-nourished.  Eyes: Conjunctivae are normal. No scleral icterus.  Neck: Neck supple.  Cardiovascular: Normal rate, regular rhythm and normal heart sounds.   Pulmonary/Chest: Effort normal and breath sounds normal.  Both mastectomy sites are well healed with no sign of local recurrence.   Lymphadenopathy:    She has no cervical adenopathy.    She has no axillary adenopathy.  lft arm lymphedema appears to be stable and  Skin also feels much softer.  Neurological: She is alert and oriented to person, place, and time.  Skin: Skin is warm and dry.  Psychiatric: She has a normal mood and affect.    Data Reviewed Recent CT of the chest, prior notes  Assessment    CA breast, Stage 4,  with no evidence of recurrence on exam and by CT.  Patient continues on Letrozole and Ibrance.     Plan    Follow up in 6 months     PCP: None   SANKAR,SEEPLAPUTHUR G 09/19/2014, 3:04 PM

## 2014-09-19 NOTE — Patient Instructions (Signed)
Follow up in 6 months. Call with any problems.

## 2014-10-14 ENCOUNTER — Inpatient Hospital Stay: Payer: Medicare HMO | Attending: Oncology | Admitting: Oncology

## 2014-10-14 ENCOUNTER — Inpatient Hospital Stay: Payer: Medicare HMO

## 2014-10-14 VITALS — BP 154/82 | HR 85 | Temp 97.3°F | Wt 226.0 lb

## 2014-10-14 DIAGNOSIS — Z79899 Other long term (current) drug therapy: Secondary | ICD-10-CM

## 2014-10-14 DIAGNOSIS — I972 Postmastectomy lymphedema syndrome: Secondary | ICD-10-CM | POA: Diagnosis not present

## 2014-10-14 DIAGNOSIS — C50919 Malignant neoplasm of unspecified site of unspecified female breast: Secondary | ICD-10-CM

## 2014-10-14 DIAGNOSIS — Z9013 Acquired absence of bilateral breasts and nipples: Secondary | ICD-10-CM

## 2014-10-14 DIAGNOSIS — Z170 Estrogen receptor positive status [ER+]: Secondary | ICD-10-CM | POA: Diagnosis not present

## 2014-10-14 DIAGNOSIS — Z9221 Personal history of antineoplastic chemotherapy: Secondary | ICD-10-CM | POA: Insufficient documentation

## 2014-10-14 DIAGNOSIS — I10 Essential (primary) hypertension: Secondary | ICD-10-CM | POA: Diagnosis not present

## 2014-10-14 DIAGNOSIS — R05 Cough: Secondary | ICD-10-CM

## 2014-10-14 DIAGNOSIS — Z79811 Long term (current) use of aromatase inhibitors: Secondary | ICD-10-CM | POA: Diagnosis not present

## 2014-10-14 DIAGNOSIS — Z87891 Personal history of nicotine dependence: Secondary | ICD-10-CM | POA: Insufficient documentation

## 2014-10-14 DIAGNOSIS — C773 Secondary and unspecified malignant neoplasm of axilla and upper limb lymph nodes: Secondary | ICD-10-CM | POA: Insufficient documentation

## 2014-10-14 DIAGNOSIS — C50912 Malignant neoplasm of unspecified site of left female breast: Secondary | ICD-10-CM | POA: Diagnosis present

## 2014-10-14 LAB — CBC WITH DIFFERENTIAL/PLATELET
BASOS ABS: 0 10*3/uL (ref 0–0.1)
BASOS PCT: 1 %
EOS ABS: 0 10*3/uL (ref 0–0.7)
EOS PCT: 1 %
HEMATOCRIT: 37.6 % (ref 35.0–47.0)
Hemoglobin: 13.1 g/dL (ref 12.0–16.0)
Lymphocytes Relative: 30 %
Lymphs Abs: 1 10*3/uL (ref 1.0–3.6)
MCH: 34.3 pg — ABNORMAL HIGH (ref 26.0–34.0)
MCHC: 34.8 g/dL (ref 32.0–36.0)
MCV: 98.6 fL (ref 80.0–100.0)
MONO ABS: 0.3 10*3/uL (ref 0.2–0.9)
MONOS PCT: 8 %
NEUTROS ABS: 2.1 10*3/uL (ref 1.4–6.5)
Neutrophils Relative %: 60 %
PLATELETS: 191 10*3/uL (ref 150–440)
RBC: 3.81 MIL/uL (ref 3.80–5.20)
RDW: 15.8 % — AB (ref 11.5–14.5)
WBC: 3.5 10*3/uL — ABNORMAL LOW (ref 3.6–11.0)

## 2014-10-14 LAB — COMPREHENSIVE METABOLIC PANEL
ALBUMIN: 4.1 g/dL (ref 3.5–5.0)
ALT: 27 U/L (ref 14–54)
ANION GAP: 7 (ref 5–15)
AST: 20 U/L (ref 15–41)
Alkaline Phosphatase: 81 U/L (ref 38–126)
BILIRUBIN TOTAL: 0.4 mg/dL (ref 0.3–1.2)
BUN: 19 mg/dL (ref 6–20)
CHLORIDE: 103 mmol/L (ref 101–111)
CO2: 29 mmol/L (ref 22–32)
Calcium: 9.7 mg/dL (ref 8.9–10.3)
Creatinine, Ser: 1.17 mg/dL — ABNORMAL HIGH (ref 0.44–1.00)
GFR calc Af Amer: 53 mL/min — ABNORMAL LOW (ref >60)
GFR calc non Af Amer: 46 mL/min — ABNORMAL LOW (ref >60)
GLUCOSE: 99 mg/dL (ref 65–99)
POTASSIUM: 4.4 mmol/L (ref 3.5–5.1)
SODIUM: 139 mmol/L (ref 135–145)
TOTAL PROTEIN: 7.6 g/dL (ref 6.5–8.1)

## 2014-10-14 NOTE — Progress Notes (Signed)
Patient does not have living will.  Former smoker.  Patient states BP medication is causing cough to the point it gags her.

## 2014-10-15 ENCOUNTER — Encounter: Payer: Self-pay | Admitting: Oncology

## 2014-10-15 LAB — CANCER ANTIGEN 27.29: CA 27.29: 12.2 U/mL (ref 0.0–38.6)

## 2014-10-15 NOTE — Progress Notes (Signed)
[place] @ [facility] Telephone:(336) [phone]  Fax:(336) [place] OB: 09/18/1945  MR#: [id number]  JDB#:[id number]  Patient Care Team: No Pcp Per Patient as PCP - General (General Practice) [name], MD (Unknown Physician Specialty) [name], MD (General Surgery)  CHIEF COMPLAINT:  Chief Complaint  Patient presents with  . OTHER    Oncology History   1. 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  Biopsy from the lymph node in the left axilla positive for metastatic memory carcinoma Estrogen receptor positive Progesterone receptor +ve hER-2/neu receptors equivocal  by Fish  IHC for HER-2/neu is 2+ Clinically staged asT4 D. N1 M0 tumor stage IV  locally advanced carcinoma.. 2. She was started on   [facility] OF 2015. 3. Treatment was changed to Cytoxan and Adriamycinffrom August 31, 2013  4.patient has finished oral 3 cycles of chemotherapy with Cytoxan and Adriamycin on October 19, 2013. 5.Patient had a bilateral mastectomy in November of 2015. ypT4B ypN3 ypM1 STAGE iv DISEASE.. repeat HER-2/neu receptor by [facility] is still equivocal (November, 2015).  6.  Patient is on letrozole and IBRANCE     Breast cancer   11/13/2013 Initial Diagnosis Breast cancer    No flowsheet data found.  INTERVAL HISTORY 69 year old lady with bilateral carcinoma breast.  Lymphedema in left upper extremity.  On I Ibrance  Swelling in the left upper extremity is gradually improving.  Patient had been tolerating IBRANCE very well without significant nausea vomiting diarrhea.  Here for further follow-up and treatment consideration The patient's blood pressure was elevated so was started on lisinopril however patient complains of constant cough and thinks that is probably related to this in [place].   REVIEW OF SYSTEMS:   Left upper extremity lymphedema is  improving. Patient complains of constant dry hacking cough and patient relates that to starting lisinopril.  Patient has recorded all the blood pressure which appears to be under control. No bony pains.  No chills.  No fever.  Tolerating IBRANCE very well.  Patient had a CT scan in July of chest which did not reveal any evidence of metastases All other system (12 systems have been reviewed) no abnormality detected  As per HPI. Otherwise, a complete review of systems is negatve.  PAST MEDICAL HISTORY: Past Medical History  Diagnosis Date  . Cancer April 2015    left breast    PAST SURGICAL HISTORY: Past Surgical History  Procedure Laterality Date  . Shoulder open rotator cuff repair  2012  . Wrist surgery Left 1969    cyst  . Portacath placement  06-07-13  . Breast surgery Bilateral 11/19/13    bilater mastectomy     FAMILY HISTORY Family History  Problem Relation Age of Onset  . Adopted: Yes   Allergies:  Mushrooms: Hives  Shellfish: N/V/Diarrhea  Significant History/PMH:   Breast Cancer:    Hypertension:    Denies medical history:    :   :   Preventive Screening:  Has patient had any of the following test? Mammography  Pap Smear (1)   Last Mammography: april 2015(1)   Last Pap Smear: about 2 years ago(1)   Smoking History: Smoking History quit several years ago(1)  PFSH: Comments: No family history of colorectal cancer, breast cancer, or ovarian cancer.  Social History: negative alcohol, negative tobacco  Additional Past Medical and Surgical History:  no significant previous medical or surgical past history   GYNECOLOGIC HISTORY:  No LMP recorded. Patient is postmenopausal.     ADVANCED DIRECTIVES:  Patient does not have any living will or healthcare power of attorney.  Information was given .  Available resources had been discussed.  We will follow-up on subsequent appointments regarding this issue  HEALTH MAINTENANCE: Social History  Substance Use  Topics  . Smoking status: Former Smoker -- 1.00 packs/day for 15 years    Types: Cigarettes    Quit date: 06/12/1985  . Smokeless tobacco: Never Used  . Alcohol Use: 0.0 oz/week    0 Standard drinks or equivalent per week     Allergies  Allergen Reactions  . Mushroom Extract Complex Hives and Swelling  . Shellfish Allergy Nausea And Vomiting    Current Outpatient Prescriptions  Medication Sig Dispense Refill  . Calcium Carbonate-Vitamin D (CALCIUM + D PO) Take 1 tablet by mouth daily.    . Cholecalciferol (VITAMIN D PO) Take 5,000 Units by mouth daily.    [name] 125 MG capsule     . ibuprofen (ADVIL,MOTRIN) 200 MG tablet Take 200 mg by mouth every 6 (six) hours as needed.    Marland Kitchen letrozole (FEMARA) 2.5 MG tablet Take 2.5 mg by mouth daily. On hold    . ondansetron (ZOFRAN-ODT) 8 MG disintegrating tablet      No current facility-administered medications for this visit.   Facility-Administered Medications Ordered in Other Visits  Medication Dose Route Frequency Provider Last Rate Last Dose  . sodium chloride 0.9 % injection 10 mL  10 mL Intravenous PRN [name], MD   10 mL at 09/12/14 1014    OBJECTIVE:  Filed Vitals:   10/14/14 1040  BP: 154/82  Pulse: 85  Temp: 97.3 F (36.3 C)     Body mass index is 35.39 kg/(m^2).    ECOG FS:0 - Asymptomatic  PHYSICAL EXAM: GENERAL:  Well developed, well nourished, sitting comfortably in the exam room in no acute distress. MENTAL STATUS:  Alert and oriented to person, place and time. HEAD:  Normocephalic, atraumatic, face symmetric, no Cushingoid features. EYES Pupils equal round and reactive to light and accomodation.  No conjunctivitis or scleral icterus. ENT:  Oropharynx clear without lesion.  Tongue normal. Mucous membranes moist.  RESPIRATORY:  Clear to auscultation without rales, wheezes or rhonchi. CARDIOVASCULAR:  Regular rate and rhythm without murmur, rub or gallop. BREAST:  Bilateral mastectomy.  Chest wall area no  evidence of recurrent disease ABDOMEN:  Soft, non-tender, with active bowel sounds, and no hepatosplenomegaly.  No masses. BACK:  No CVA tenderness.  No tenderness on percussion of the back or rib cage. SKIN:  No rashes, ulcers or lesions. EXTREMITIES: Left upper extremity lymphedema.  Getting better LYMPH NODES: No palpable cervical, supraclavicular, axillary or inguinal adenopathy  NEUROLOGICAL: Unremarkable. PSYCH:  Appropriate.   LAB RESULTS:  Appointment on 10/14/2014  Component Date Value Ref Range Status  . WBC 10/14/2014 3.5* 3.6 - 11.0 K/uL Final  . RBC 10/14/2014 3.81  3.80 - 5.20 MIL/uL Final  . Hemoglobin 10/14/2014 13.1  12.0 - 16.0 g/dL Final  . HCT 10/14/2014 37.6  35.0 - 47.0 % Final  . MCV 10/14/2014 98.6  80.0 - 100.0 fL Final  . MCH 10/14/2014 34.3* 26.0 - 34.0 pg Final  . MCHC 10/14/2014 34.8  32.0 - 36.0 g/dL Final  . RDW 10/14/2014 15.8* 11.5 - 14.5 % Final  . Platelets 10/14/2014 191  150 - 440  K/uL Final  . Neutrophils Relative % 10/14/2014 60   Final  . Neutro Abs 10/14/2014 2.1  1.4 - 6.5 K/uL Final  . Lymphocytes Relative 10/14/2014 30   Final  . Lymphs Abs 10/14/2014 1.0  1.0 - 3.6 K/uL Final  . Monocytes Relative 10/14/2014 8   Final  . Monocytes Absolute 10/14/2014 0.3  0.2 - 0.9 K/uL Final  . Eosinophils Relative 10/14/2014 1   Final  . Eosinophils Absolute 10/14/2014 0.0  0 - 0.7 K/uL Final  . Basophils Relative 10/14/2014 1   Final  . Basophils Absolute 10/14/2014 0.0  0 - 0.1 K/uL Final  . Sodium 10/14/2014 139  135 - 145 mmol/L Final  . Potassium 10/14/2014 4.4  3.5 - 5.1 mmol/L Final  . Chloride 10/14/2014 103  101 - 111 mmol/L Final  . CO2 10/14/2014 29  22 - 32 mmol/L Final  . Glucose, Bld 10/14/2014 99  65 - 99 mg/dL Final  . BUN 10/14/2014 19  6 - 20 mg/dL Final  . Creatinine, Ser 10/14/2014 1.17* 0.44 - 1.00 mg/dL Final  . Calcium 10/14/2014 9.7  8.9 - 10.3 mg/dL Final  . Total Protein 10/14/2014 7.6  6.5 - 8.1 g/dL Final  . Albumin  10/14/2014 4.1  3.5 - 5.0 g/dL Final  . AST 10/14/2014 20  15 - 41 U/L Final  . ALT 10/14/2014 27  14 - 54 U/L Final  . Alkaline Phosphatase 10/14/2014 81  38 - 126 U/L Final  . Total Bilirubin 10/14/2014 0.4  0.3 - 1.2 mg/dL Final  . GFR calc non Af Amer 10/14/2014 46* >60 mL/min Final  . GFR calc Af Amer 10/14/2014 53* >60 mL/min Final   Comment: (NOTE) The eGFR has been calculated using the CKD EPI equation. This calculation has not been validated in all clinical situations. eGFR's persistently <60 mL/min signify possible Chronic Kidney Disease.   . Anion gap 10/14/2014 7  5 - 15 Final  . CA 27.29 10/14/2014 12.2  0.0 - 38.6 U/mL Final   Comment: (NOTE) Bayer Centaur/ACS methodology Performed At: [facility] [place], [place] [id number] Lindon Romp MD YS:[id number]     Lab Results  Component Value Date   LABCA2 12.2 10/14/2014      ASSESSMENT: 69 year old lady with history of carcinoma breast stage IV disease.  Presently on now Ibrance and letrozole therapy.  Tolerating treatment very well. Patient is continuing with letrozole and [facility] MEDICAL DECISION MAKING:  Continue anti-hormonal treatment with IBRANCE and letrozole Patient's record for blood pressure has been reviewed.  Considering that patient is having dry hacking cough with a normal CT scan done in July we would like to stop lisinopril patient will and keep all the blood pressure record.  If cough does not get better to repeat PET scan or CT scan would be advised.. Myelosuppression without any fever secondary to [facility] will continue to monitor Letrozole would be continued a bone density study would be assessed periodically   Breast cancer   Staging form: Breast, AJCC 7th Edition     Clinical: Stage IV (T3, N3, M1) - Signed by [name], MD on 06/14/2014   [name], MD   10/15/2014 8:26 AM

## 2014-11-11 ENCOUNTER — Inpatient Hospital Stay: Payer: Medicare HMO

## 2014-11-11 ENCOUNTER — Inpatient Hospital Stay: Payer: Medicare HMO | Attending: Oncology | Admitting: Oncology

## 2014-11-11 ENCOUNTER — Encounter: Payer: Self-pay | Admitting: Oncology

## 2014-11-11 VITALS — BP 189/93 | HR 93 | Temp 96.8°F | Resp 18 | Wt 228.2 lb

## 2014-11-11 DIAGNOSIS — C773 Secondary and unspecified malignant neoplasm of axilla and upper limb lymph nodes: Secondary | ICD-10-CM | POA: Diagnosis not present

## 2014-11-11 DIAGNOSIS — Z9221 Personal history of antineoplastic chemotherapy: Secondary | ICD-10-CM

## 2014-11-11 DIAGNOSIS — Z170 Estrogen receptor positive status [ER+]: Secondary | ICD-10-CM | POA: Diagnosis not present

## 2014-11-11 DIAGNOSIS — Z87891 Personal history of nicotine dependence: Secondary | ICD-10-CM | POA: Insufficient documentation

## 2014-11-11 DIAGNOSIS — Z79899 Other long term (current) drug therapy: Secondary | ICD-10-CM | POA: Diagnosis not present

## 2014-11-11 DIAGNOSIS — C50912 Malignant neoplasm of unspecified site of left female breast: Secondary | ICD-10-CM | POA: Insufficient documentation

## 2014-11-11 DIAGNOSIS — I972 Postmastectomy lymphedema syndrome: Secondary | ICD-10-CM | POA: Insufficient documentation

## 2014-11-11 DIAGNOSIS — Z79811 Long term (current) use of aromatase inhibitors: Secondary | ICD-10-CM | POA: Insufficient documentation

## 2014-11-11 DIAGNOSIS — Z9013 Acquired absence of bilateral breasts and nipples: Secondary | ICD-10-CM | POA: Insufficient documentation

## 2014-11-11 DIAGNOSIS — Z780 Asymptomatic menopausal state: Secondary | ICD-10-CM | POA: Diagnosis not present

## 2014-11-11 LAB — COMPREHENSIVE METABOLIC PANEL
ALK PHOS: 73 U/L (ref 38–126)
ALT: 23 U/L (ref 14–54)
AST: 27 U/L (ref 15–41)
Albumin: 4.2 g/dL (ref 3.5–5.0)
Anion gap: 7 (ref 5–15)
BUN: 22 mg/dL — AB (ref 6–20)
CALCIUM: 9 mg/dL (ref 8.9–10.3)
CHLORIDE: 104 mmol/L (ref 101–111)
CO2: 25 mmol/L (ref 22–32)
CREATININE: 1.11 mg/dL — AB (ref 0.44–1.00)
GFR calc Af Amer: 57 mL/min — ABNORMAL LOW (ref >60)
GFR calc non Af Amer: 49 mL/min — ABNORMAL LOW (ref >60)
Glucose, Bld: 148 mg/dL — ABNORMAL HIGH (ref 65–99)
Potassium: 3.7 mmol/L (ref 3.5–5.1)
SODIUM: 136 mmol/L (ref 135–145)
Total Bilirubin: 0.7 mg/dL (ref 0.3–1.2)
Total Protein: 7.4 g/dL (ref 6.5–8.1)

## 2014-11-11 LAB — CBC WITH DIFFERENTIAL/PLATELET
BASOS ABS: 0 10*3/uL (ref 0–0.1)
Basophils Relative: 1 %
EOS ABS: 0 10*3/uL (ref 0–0.7)
EOS PCT: 1 %
HCT: 36.8 % (ref 35.0–47.0)
HEMOGLOBIN: 12.8 g/dL (ref 12.0–16.0)
Lymphocytes Relative: 22 %
Lymphs Abs: 0.9 10*3/uL — ABNORMAL LOW (ref 1.0–3.6)
MCH: 33.9 pg (ref 26.0–34.0)
MCHC: 34.9 g/dL (ref 32.0–36.0)
MCV: 97.2 fL (ref 80.0–100.0)
MONO ABS: 0.2 10*3/uL (ref 0.2–0.9)
Monocytes Relative: 4 %
NEUTROS ABS: 2.9 10*3/uL (ref 1.4–6.5)
NEUTROS PCT: 72 %
PLATELETS: 221 10*3/uL (ref 150–440)
RBC: 3.79 MIL/uL — AB (ref 3.80–5.20)
RDW: 15.6 % — ABNORMAL HIGH (ref 11.5–14.5)
WBC: 4.1 10*3/uL (ref 3.6–11.0)

## 2014-11-11 MED ORDER — SODIUM CHLORIDE 0.9 % IJ SOLN
10.0000 mL | Freq: Once | INTRAMUSCULAR | Status: AC
Start: 2014-11-11 — End: 2014-11-11
  Administered 2014-11-11: 10 mL via INTRAVENOUS
  Filled 2014-11-11: qty 10

## 2014-11-11 MED ORDER — HEPARIN SOD (PORK) LOCK FLUSH 100 UNIT/ML IV SOLN
500.0000 [IU] | Freq: Once | INTRAVENOUS | Status: AC
  Administered 2014-11-11: 500 [IU] via INTRAVENOUS
  Filled 2014-11-11: qty 5

## 2014-11-11 MED ORDER — SODIUM CHLORIDE 0.9 % IJ SOLN
10.0000 mL | Freq: Once | INTRAMUSCULAR | Status: AC
  Administered 2014-11-11: 10 mL via INTRAVENOUS
  Filled 2014-11-11: qty 10

## 2014-11-11 NOTE — Progress Notes (Signed)
[place] @ [facility] Telephone:(336) [phone]  Fax:(336) [place] OB: 1945/11/25  MR#: [id number]  UGQ#:[id number]  Patient Care Team: No Pcp Per Patient as PCP - General (General Practice) [name], MD (Unknown Physician Specialty) [name], MD (General Surgery)  CHIEF COMPLAINT:  Chief Complaint  Patient presents with  . OTHER    breast ca    Oncology History   1. 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  Biopsy from the lymph node in the left axilla positive for metastatic memory carcinoma Estrogen receptor positive Progesterone receptor +ve hER-2/neu receptors equivocal  by [facility]  IHC for HER-2/neu is 2+ Clinically staged asT4 D. N1 M0 tumor stage IV  locally advanced carcinoma.. 2. She was started on   [place] OF 2015. 3. Treatment was changed to Cytoxan and Adriamycinffrom August 31, 2013  4.patient has finished oral 3 cycles of chemotherapy with Cytoxan and Adriamycin on October 19, 2013. 5.Patient had a bilateral mastectomy in November of 2015. ypT4B ypN3 ypM1 STAGE iv DISEASE.. repeat HER-2/neu receptor by [facility] is still equivocal (November, 2015).  6.  Patient is on letrozole and IBRANCE     Breast cancer ([place])   11/13/2013 Initial Diagnosis Breast cancer    No flowsheet data found.  INTERVAL HISTORY 69 year old lady with a history of bilateral carcinoma breast came today further follow-up.  See has a stage IV tumor on letrozole and IBRANCE. Tolerating treatment very well. Lymphedema in left upper extremity is stable Appetite has been improving no nausea no vomiting no diarrhea   REVIEW OF SYSTEMS:   GENERAL:  Feels good.  Active.  No fevers, sweats or weight loss. PERFORMANCE STATUS (ECOG01 HEENT:  No visual changes, runny nose, sore throat, mouth sores or tenderness. Lungs: No shortness of breath or cough.   No hemoptysis. Cardiac:  No chest pain, palpitations, orthopnea, or PND. GI:  No nausea, vomiting, diarrhea, constipation, melena or hematochezia. GU:  No urgency, frequency, dysuria, or hematuria. Musculoskeletal:  No back pain.  No joint pain.  No muscle tenderness. Extremities:  Left upper extremity lymphedema is improving Skin:  No rashes or skin changes. Neuro:  No headache, numbness or weakness, balance or coordination issues. Endocrine:  No diabetes, thyroid issues, hot flashes or night sweats. Psych:  No mood changes, depression or anxiety. Pain:  No focal pain. Review of systems:  All other systems reviewed and found to be negative.  PAST MEDICAL HISTORY: Past Medical History  Diagnosis Date  . Cancer [facility] April 2015    left breast    PAST SURGICAL HISTORY: Past Surgical History  Procedure Laterality Date  . Shoulder open rotator cuff repair  2012  . Wrist surgery Left 1969    cyst  . Portacath placement  06-07-13  . Breast surgery Bilateral 11/19/13    bilater mastectomy     FAMILY HISTORY Family History  Problem Relation Age of Onset  . Adopted: Yes   Allergies:  Mushrooms: Hives  Shellfish: N/V/Diarrhea  Significant History/PMH:   Breast Cancer:    Hypertension:    Denies medical history:    :   :   Preventive Screening:  Has patient had any of the following test? Mammography  Pap Smear (1)   Last Mammography: april 2015(1)   Last Pap Smear: about 2 years ago(1)   Smoking History: Smoking History quit  several years ago(1)  PFSH: Comments: No family history of colorectal cancer, breast cancer, or ovarian cancer.  Social History: negative alcohol, negative tobacco  Additional Past Medical and Surgical History: no significant previous medical or surgical past history   GYNECOLOGIC HISTORY:  No LMP recorded. Patient is postmenopausal.     ADVANCED DIRECTIVES:  Patient does not have any living will or healthcare power of attorney.   Information was given .  Available resources had been discussed.  We will follow-up on subsequent appointments regarding this issue  HEALTH MAINTENANCE: Social History  Substance Use Topics  . Smoking status: Former Smoker -- 1.00 packs/day for 15 years    Types: Cigarettes    Quit date: 06/12/1985  . Smokeless tobacco: Never Used  . Alcohol Use: 0.0 oz/week    0 Standard drinks or equivalent per week     Allergies  Allergen Reactions  . Mushroom Extract Complex Hives and Swelling  . Shellfish Allergy Nausea And Vomiting    Current Outpatient Prescriptions  Medication Sig Dispense Refill  . Calcium Carbonate-Vitamin D (CALCIUM + D PO) Take 1 tablet by mouth daily.    . Cholecalciferol (VITAMIN D PO) Take 5,000 Units by mouth daily.    [name] 125 MG capsule     . ibuprofen (ADVIL,MOTRIN) 200 MG tablet Take 200 mg by mouth every 6 (six) hours as needed.    Marland Kitchen letrozole (FEMARA) 2.5 MG tablet Take 2.5 mg by mouth daily. On hold    . ondansetron (ZOFRAN-ODT) 8 MG disintegrating tablet      No current facility-administered medications for this visit.   Facility-Administered Medications Ordered in Other Visits  Medication Dose Route Frequency Provider Last Rate Last Dose  . sodium chloride 0.9 % injection 10 mL  10 mL Intravenous PRN [name], MD   10 mL at 09/12/14 1014    OBJECTIVE:  Filed Vitals:   11/11/14 1058  BP: 189/93  Pulse: 93  Temp: 96.8 F (36 C)  Resp: 18     Body mass index is 35.73 kg/(m^2).    ECOG FS:1 - Symptomatic but completely ambulatory  PHYSICAL EXAM: GENERAL:  Well developed, well nourished, sitting comfortably in the exam room in no acute distress. MENTAL STATUS:  Alert and oriented to person, place and time. HEAD:  Normocephalic, atraumatic, face symmetric, no Cushingoid features. EYES Pupils equal round and reactive to light and accomodation.  No conjunctivitis or scleral icterus. ENT:  Oropharynx clear without lesion.  Tongue normal.  Mucous membranes moist.  RESPIRATORY:  Clear to auscultation without rales, wheezes or rhonchi. CARDIOVASCULAR:  Regular rate and rhythm without murmur, rub or gallop. BREAST:  Bilateral mastectomy.  Chest wall area no evidence of recurrent disease ABDOMEN:  Soft, non-tender, with active bowel sounds, and no hepatosplenomegaly.  No masses. BACK:  No CVA tenderness.  No tenderness on percussion of the back or rib cage. SKIN:  No rashes, ulcers or lesions. EXTREMITIES: Left upper extremity lymphedema.  Getting better LYMPH NODES: No palpable cervical, supraclavicular, axillary or inguinal adenopathy  NEUROLOGICAL: Unremarkable. PSYCH:  Appropriate.   LAB RESULTS:  No visits with results within 3 Day(s) from this visit. Latest known visit with results is:  Appointment on 10/14/2014  Component Date Value Ref Range Status  . WBC 10/14/2014 3.5* 3.6 - 11.0 K/uL Final  . RBC 10/14/2014 3.81  3.80 - 5.20 MIL/uL Final  . Hemoglobin 10/14/2014 13.1  12.0 - 16.0 g/dL Final  . HCT 10/14/2014 37.6  35.0 -  47.0 % Final  . MCV 10/14/2014 98.6  80.0 - 100.0 fL Final  . MCH 10/14/2014 34.3* 26.0 - 34.0 pg Final  . MCHC 10/14/2014 34.8  32.0 - 36.0 g/dL Final  . RDW 10/14/2014 15.8* 11.5 - 14.5 % Final  . Platelets 10/14/2014 191  150 - 440 K/uL Final  . Neutrophils Relative % 10/14/2014 60   Final  . Neutro Abs 10/14/2014 2.1  1.4 - 6.5 K/uL Final  . Lymphocytes Relative 10/14/2014 30   Final  . Lymphs Abs 10/14/2014 1.0  1.0 - 3.6 K/uL Final  . Monocytes Relative 10/14/2014 8   Final  . Monocytes Absolute 10/14/2014 0.3  0.2 - 0.9 K/uL Final  . Eosinophils Relative 10/14/2014 1   Final  . Eosinophils Absolute 10/14/2014 0.0  0 - 0.7 K/uL Final  . Basophils Relative 10/14/2014 1   Final  . Basophils Absolute 10/14/2014 0.0  0 - 0.1 K/uL Final  . Sodium 10/14/2014 139  135 - 145 mmol/L Final  . Potassium 10/14/2014 4.4  3.5 - 5.1 mmol/L Final  . Chloride 10/14/2014 103  101 - 111 mmol/L Final    . CO2 10/14/2014 29  22 - 32 mmol/L Final  . Glucose, Bld 10/14/2014 99  65 - 99 mg/dL Final  . BUN 10/14/2014 19  6 - 20 mg/dL Final  . Creatinine, Ser 10/14/2014 1.17* 0.44 - 1.00 mg/dL Final  . Calcium 10/14/2014 9.7  8.9 - 10.3 mg/dL Final  . Total Protein 10/14/2014 7.6  6.5 - 8.1 g/dL Final  . Albumin 10/14/2014 4.1  3.5 - 5.0 g/dL Final  . AST 10/14/2014 20  15 - 41 U/L Final  . ALT 10/14/2014 27  14 - 54 U/L Final  . Alkaline Phosphatase 10/14/2014 81  38 - 126 U/L Final  . Total Bilirubin 10/14/2014 0.4  0.3 - 1.2 mg/dL Final  . GFR calc non Af Amer 10/14/2014 46* >60 mL/min Final  . GFR calc Af Amer 10/14/2014 53* >60 mL/min Final   Comment: (NOTE) The eGFR has been calculated using the CKD EPI equation. This calculation has not been validated in all clinical situations. eGFR's persistently <60 mL/min signify possible Chronic Kidney Disease.   . Anion gap 10/14/2014 7  5 - 15 Final  . CA 27.29 10/14/2014 12.2  0.0 - 38.6 U/mL Final   Comment: (NOTE) Bayer Centaur/ACS methodology Performed At: [facility] [place], [place] [id number] Lindon Romp MD [id number]     Lab Results  Component Value Date   LABCA2 12.2 10/14/2014      ASSESSMENT: 69 year old lady with history of carcinoma breast stage IV disease.  Presently on now Ibrance and letrozole therapy.  Tolerating treatment very well. Patient is continuing with letrozole and [facility] MEDICAL DECISION MAKING:  All lab data has been reviewed. We will continue IBRANCE and letrozole We will reevaluate patient in 2 months with tumor markers and lab In between patient will call me if there is any significant problem  Breast cancer   Staging form: Breast, AJCC 7th Edition     Clinical: Stage IV (T3, N3, M1) - Signed by [name], MD on 06/14/2014   [name], MD   11/11/2014 11:38 AM

## 2014-11-12 LAB — CANCER ANTIGEN 27.29: CA 27.29: 17.9 U/mL (ref 0.0–38.6)

## 2014-12-31 ENCOUNTER — Telehealth: Payer: Self-pay | Admitting: *Deleted

## 2014-12-31 NOTE — Telephone Encounter (Signed)
Patient wants to know if corrections have been made to form sent by Patient [place] on her behalf.    Per Hayley the form has gone to medical records and will need [employer] to refax her the form for any corrections.  Called the patient and asked her call the foundation and have them refax form, attention Hayley.  States she will do so.

## 2015-01-06 ENCOUNTER — Inpatient Hospital Stay: Payer: Medicare HMO

## 2015-01-06 ENCOUNTER — Inpatient Hospital Stay (HOSPITAL_BASED_OUTPATIENT_CLINIC_OR_DEPARTMENT_OTHER): Payer: Medicare HMO | Admitting: Oncology

## 2015-01-06 ENCOUNTER — Encounter: Payer: Self-pay | Admitting: Oncology

## 2015-01-06 ENCOUNTER — Inpatient Hospital Stay: Payer: Medicare HMO | Attending: Oncology

## 2015-01-06 VITALS — BP 194/82 | HR 91 | Temp 97.1°F | Resp 18 | Wt 229.7 lb

## 2015-01-06 DIAGNOSIS — Z79899 Other long term (current) drug therapy: Secondary | ICD-10-CM

## 2015-01-06 DIAGNOSIS — Z170 Estrogen receptor positive status [ER+]: Secondary | ICD-10-CM

## 2015-01-06 DIAGNOSIS — Z87891 Personal history of nicotine dependence: Secondary | ICD-10-CM

## 2015-01-06 DIAGNOSIS — Z79811 Long term (current) use of aromatase inhibitors: Secondary | ICD-10-CM

## 2015-01-06 DIAGNOSIS — I972 Postmastectomy lymphedema syndrome: Secondary | ICD-10-CM | POA: Insufficient documentation

## 2015-01-06 DIAGNOSIS — C50912 Malignant neoplasm of unspecified site of left female breast: Secondary | ICD-10-CM

## 2015-01-06 DIAGNOSIS — Z780 Asymptomatic menopausal state: Secondary | ICD-10-CM | POA: Insufficient documentation

## 2015-01-06 DIAGNOSIS — Z9013 Acquired absence of bilateral breasts and nipples: Secondary | ICD-10-CM | POA: Insufficient documentation

## 2015-01-06 DIAGNOSIS — Z9221 Personal history of antineoplastic chemotherapy: Secondary | ICD-10-CM

## 2015-01-06 DIAGNOSIS — C773 Secondary and unspecified malignant neoplasm of axilla and upper limb lymph nodes: Secondary | ICD-10-CM

## 2015-01-06 LAB — COMPREHENSIVE METABOLIC PANEL
ALBUMIN: 4 g/dL (ref 3.5–5.0)
ALK PHOS: 83 U/L (ref 38–126)
ALT: 26 U/L (ref 14–54)
AST: 22 U/L (ref 15–41)
Anion gap: 8 (ref 5–15)
BILIRUBIN TOTAL: 0.4 mg/dL (ref 0.3–1.2)
BUN: 18 mg/dL (ref 6–20)
CO2: 26 mmol/L (ref 22–32)
CREATININE: 0.99 mg/dL (ref 0.44–1.00)
Calcium: 9.5 mg/dL (ref 8.9–10.3)
Chloride: 102 mmol/L (ref 101–111)
GFR calc Af Amer: >60 mL/min (ref >60)
GFR, EST NON AFRICAN AMERICAN: 56 mL/min — AB (ref >60)
GLUCOSE: 100 mg/dL — AB (ref 65–99)
POTASSIUM: 3.8 mmol/L (ref 3.5–5.1)
Sodium: 136 mmol/L (ref 135–145)
TOTAL PROTEIN: 7.1 g/dL (ref 6.5–8.1)

## 2015-01-06 LAB — CBC WITH DIFFERENTIAL/PLATELET
BASOS ABS: 0.1 10*3/uL (ref 0–0.1)
BASOS PCT: 2 %
Eosinophils Absolute: 0.1 10*3/uL (ref 0–0.7)
Eosinophils Relative: 2 %
HEMATOCRIT: 36.5 % (ref 35.0–47.0)
HEMOGLOBIN: 12.8 g/dL (ref 12.0–16.0)
LYMPHS PCT: 32 %
Lymphs Abs: 1.2 10*3/uL (ref 1.0–3.6)
MCH: 34.3 pg — ABNORMAL HIGH (ref 26.0–34.0)
MCHC: 34.9 g/dL (ref 32.0–36.0)
MCV: 98.1 fL (ref 80.0–100.0)
MONO ABS: 0.3 10*3/uL (ref 0.2–0.9)
Monocytes Relative: 8 %
NEUTROS ABS: 2.1 10*3/uL (ref 1.4–6.5)
NEUTROS PCT: 56 %
Platelets: 203 10*3/uL (ref 150–440)
RBC: 3.72 MIL/uL — AB (ref 3.80–5.20)
RDW: 15 % — AB (ref 11.5–14.5)
WBC: 3.7 10*3/uL (ref 3.6–11.0)

## 2015-01-06 MED ORDER — HEPARIN SOD (PORK) LOCK FLUSH 100 UNIT/ML IV SOLN
500.0000 [IU] | Freq: Once | INTRAVENOUS | Status: AC
  Administered 2015-01-06: 500 [IU] via INTRAVENOUS
  Filled 2015-01-06: qty 5

## 2015-01-06 MED ORDER — SODIUM CHLORIDE 0.9 % IJ SOLN
10.0000 mL | INTRAMUSCULAR | Status: DC | PRN
  Administered 2015-01-06: 10 mL via INTRAVENOUS
  Filled 2015-01-06: qty 10

## 2015-01-06 MED ORDER — LETROZOLE 2.5 MG PO TABS: 2.5000 mg | ORAL_TABLET | Freq: Every day | ORAL | Status: DC

## 2015-01-06 NOTE — Progress Notes (Signed)
Patient states funding is running out at end of month for Letrozole. Will be getting new insurance and can get Letrozole for free through their mail order.  Patient wants to discuss about new prescription.  Also wants MD to check top of right foot where she had an accident with her horse.

## 2015-01-06 NOTE — Progress Notes (Signed)
[place] @ [facility] Telephone:(336) [phone]  Fax:(336) [place] OB: 1945/12/03  MR#: [id number]  JYN#:[id number]  Patient Care Team: No Pcp Per Patient as PCP - General (General Practice) [name], MD (Unknown Physician Specialty) [name], MD (General Surgery)  CHIEF COMPLAINT:  Chief Complaint  Patient presents with  . Breast Cancer    Oncology History   1. 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  Biopsy from the lymph node in the left axilla positive for metastatic memory carcinoma Estrogen receptor positive Progesterone receptor +ve hER-2/neu receptors equivocal  by Fish  IHC for HER-2/neu is 2+ Clinically staged asT4 D. N1 M0 tumor stage IV  locally advanced carcinoma.. 2. She was started on   [place] OF 2015. 3. Treatment was changed to Cytoxan and Adriamycinffrom August 31, 2013  4.patient has finished oral 3 cycles of chemotherapy with Cytoxan and Adriamycin on October 19, 2013. 5.Patient had a bilateral mastectomy in November of 2015. ypT4B ypN3 ypM1 STAGE iv DISEASE.. repeat HER-2/neu receptor by [facility] is still equivocal (November, 2015).  6.  Patient is on letrozole and IBRANCE     Breast cancer ([place])   11/13/2013 Initial Diagnosis Breast cancer    No flowsheet data found.  INTERVAL HISTORY 69 year old lady with history of stage IV carcinoma of breast.  Stage IV NED Presently on IBRANCE and letrozole Lymphedema left upper extremity is improved Tolerating IBRANCE very well.  Has myelosuppression without any fever No chills no bony pains. Is and has an appointment to see surgeon in February   REVIEW OF SYSTEMS:   GENERAL:  Feels good.  Active.  No fevers, sweats or weight loss. PERFORMANCE STATUS (ECOG01 HEENT:  No visual changes, runny nose, sore throat, mouth sores or tenderness. Lungs: No shortness of  breath or cough.  No hemoptysis. Cardiac:  No chest pain, palpitations, orthopnea, or PND. GI:  No nausea, vomiting, diarrhea, constipation, melena or hematochezia. GU:  No urgency, frequency, dysuria, or hematuria. Musculoskeletal:  No back pain.  No joint pain.  No muscle tenderness. Extremities:  Left upper extremity lymphedema is improving Skin:  No rashes or skin changes. Neuro:  No headache, numbness or weakness, balance or coordination issues. Endocrine:  No diabetes, thyroid issues, hot flashes or night sweats. Psych:  No mood changes, depression or anxiety. Pain:  No focal pain. Review of systems:  All other systems reviewed and found to be negative.  PAST MEDICAL HISTORY: Past Medical History  Diagnosis Date  . Cancer [facility]) April 2015    left breast    PAST SURGICAL HISTORY: Past Surgical History  Procedure Laterality Date  . Shoulder open rotator cuff repair  2012  . Wrist surgery Left 1969    cyst  . Portacath placement  06-07-13  . Breast surgery Bilateral 11/19/13    bilater mastectomy     FAMILY HISTORY Family History  Problem Relation Age of Onset  . Adopted: Yes   Allergies:  Mushrooms: Hives  Shellfish: N/V/Diarrhea  Significant History/PMH:   Breast Cancer:    Hypertension:    Denies medical history:    :   :   Preventive Screening:  Has patient had any of the following test? Mammography  Pap Smear (1)   Last Mammography: april 2015(1)   Last Pap Smear: about 2 years ago(1)   Smoking History: Smoking History  quit several years ago(1)  PFSH: Comments: No family history of colorectal cancer, breast cancer, or ovarian cancer.  Social History: negative alcohol, negative tobacco  Additional Past Medical and Surgical History: no significant previous medical or surgical past history   GYNECOLOGIC HISTORY:  No LMP recorded. Patient is postmenopausal.     ADVANCED DIRECTIVES:  Patient does not have any living will or healthcare power of  attorney.  Information was given .  Available resources had been discussed.  We will follow-up on subsequent appointments regarding this issue  HEALTH MAINTENANCE: Social History  Substance Use Topics  . Smoking status: Former Smoker -- 1.00 packs/day for 15 years    Types: Cigarettes    Quit date: 06/12/1985  . Smokeless tobacco: Never Used  . Alcohol Use: 0.0 oz/week    0 Standard drinks or equivalent per week     Allergies  Allergen Reactions  . Mushroom Extract Complex Hives and Swelling  . Shellfish Allergy Nausea And Vomiting    Current Outpatient Prescriptions  Medication Sig Dispense Refill  . Calcium Carbonate-Vitamin D (CALCIUM + D PO) Take 1 tablet by mouth daily.    . Cholecalciferol (VITAMIN D PO) Take 5,000 Units by mouth daily.    [name] 125 MG capsule     . ibuprofen (ADVIL,MOTRIN) 200 MG tablet Take 200 mg by mouth every 6 (six) hours as needed.    Marland Kitchen letrozole (FEMARA) 2.5 MG tablet Take 2.5 mg by mouth daily. On hold    . ondansetron (ZOFRAN-ODT) 8 MG disintegrating tablet      No current facility-administered medications for this visit.   Facility-Administered Medications Ordered in Other Visits  Medication Dose Route Frequency Provider Last Rate Last Dose  . sodium chloride 0.9 % injection 10 mL  10 mL Intravenous PRN [name], MD   10 mL at 09/12/14 1014    OBJECTIVE:  Filed Vitals:   01/06/15 1119 01/06/15 1120  BP: 194/82   Pulse: 91   Temp: 97.1 F (36.2 C)   Resp:  18     Body mass index is 35.97 kg/(m^2).    ECOG FS:1 - Symptomatic but completely ambulatory  PHYSICAL EXAM: GENERAL:  Well developed, well nourished, sitting comfortably in the exam room in no acute distress. MENTAL STATUS:  Alert and oriented to person, place and time. HEAD:  Normocephalic, atraumatic, face symmetric, no Cushingoid features. EYES Pupils equal round and reactive to light and accomodation.  No conjunctivitis or scleral icterus. ENT:  Oropharynx clear  without lesion.  Tongue normal. Mucous membranes moist.  RESPIRATORY:  Clear to auscultation without rales, wheezes or rhonchi. CARDIOVASCULAR:  Regular rate and rhythm without murmur, rub or gallop. BREAST:  Bilateral mastectomy.  Chest wall area no evidence of recurrent disease ABDOMEN:  Soft, non-tender, with active bowel sounds, and no hepatosplenomegaly.  No masses. BACK:  No CVA tenderness.  No tenderness on percussion of the back or rib cage. SKIN:  No rashes, ulcers or lesions. EXTREMITIES: Left upper extremity lymphedema.  Getting better LYMPH NODES: No palpable cervical, supraclavicular, axillary or inguinal adenopathy  NEUROLOGICAL: Unremarkable. PSYCH:  Appropriate.   LAB RESULTS:  Infusion on 01/06/2015  Component Date Value Ref Range Status  . WBC 01/06/2015 3.7  3.6 - 11.0 K/uL Final  . RBC 01/06/2015 3.72* 3.80 - 5.20 MIL/uL Final  . Hemoglobin 01/06/2015 12.8  12.0 - 16.0 g/dL Final  . HCT 01/06/2015 36.5  35.0 - 47.0 % Final  . MCV 01/06/2015 98.1  80.0 -  100.0 fL Final  . MCH 01/06/2015 34.3* 26.0 - 34.0 pg Final  . MCHC 01/06/2015 34.9  32.0 - 36.0 g/dL Final  . RDW 01/06/2015 15.0* 11.5 - 14.5 % Final  . Platelets 01/06/2015 203  150 - 440 K/uL Final  . Neutrophils Relative % 01/06/2015 56   Final  . Neutro Abs 01/06/2015 2.1  1.4 - 6.5 K/uL Final  . Lymphocytes Relative 01/06/2015 32   Final  . Lymphs Abs 01/06/2015 1.2  1.0 - 3.6 K/uL Final  . Monocytes Relative 01/06/2015 8   Final  . Monocytes Absolute 01/06/2015 0.3  0.2 - 0.9 K/uL Final  . Eosinophils Relative 01/06/2015 2   Final  . Eosinophils Absolute 01/06/2015 0.1  0 - 0.7 K/uL Final  . Basophils Relative 01/06/2015 2   Final  . Basophils Absolute 01/06/2015 0.1  0 - 0.1 K/uL Final  . Sodium 01/06/2015 136  135 - 145 mmol/L Final  . Potassium 01/06/2015 3.8  3.5 - 5.1 mmol/L Final  . Chloride 01/06/2015 102  101 - 111 mmol/L Final  . CO2 01/06/2015 26  22 - 32 mmol/L Final  . Glucose, Bld  01/06/2015 100* 65 - 99 mg/dL Final  . BUN 01/06/2015 18  6 - 20 mg/dL Final  . Creatinine, Ser 01/06/2015 0.99  0.44 - 1.00 mg/dL Final  . Calcium 01/06/2015 9.5  8.9 - 10.3 mg/dL Final  . Total Protein 01/06/2015 7.1  6.5 - 8.1 g/dL Final  . Albumin 01/06/2015 4.0  3.5 - 5.0 g/dL Final  . AST 01/06/2015 22  15 - 41 U/L Final  . ALT 01/06/2015 26  14 - 54 U/L Final  . Alkaline Phosphatase 01/06/2015 83  38 - 126 U/L Final  . Total Bilirubin 01/06/2015 0.4  0.3 - 1.2 mg/dL Final  . GFR calc non Af Amer 01/06/2015 56* >60 mL/min Final  . GFR calc Af Amer 01/06/2015 >60  >60 mL/min Final   Comment: (NOTE) The eGFR has been calculated using the CKD EPI equation. This calculation has not been validated in all clinical situations. eGFR's persistently <60 mL/min signify possible Chronic Kidney Disease.   . Anion gap 01/06/2015 8  5 - 15 Final    Lab Results  Component Value Date   LABCA2 17.9 11/11/2014      ASSESSMENT: 69 year old lady with history of carcinoma breast stage IV disease.  Presently on now Ibrance and letrozole therapy.  Tolerating treatment very well. Patient is continuing with letrozole and IBRANCE MEDICAL DECISION MAKING:  Continue anti-hormonal therapy Stage IV NED,   there is no clinical evidence of recurrent disease at present time. Lymphedema is improving Continue follow-up in 3 months with CBC, comprehensive metabolic panel and tumor marker Breast cancer   Staging form: Breast, AJCC 7th Edition     Clinical: Stage IV (T3, N3, M1) - Signed by [name], MD on 06/14/2014   [name], MD   01/06/2015 11:39 AM

## 2015-01-07 LAB — CANCER ANTIGEN 27.29: CA 27.29: 11.9 U/mL (ref 0.0–38.6)

## 2015-01-14 ENCOUNTER — Other Ambulatory Visit: Payer: Self-pay | Admitting: *Deleted

## 2015-01-14 MED ORDER — IBRANCE 125 MG PO CAPS: 125.0000 mg | ORAL_CAPSULE | Freq: Every day | ORAL | Status: DC

## 2015-01-14 NOTE — Telephone Encounter (Signed)
Refill for ibrance escribed to [facility].

## 2015-01-23 ENCOUNTER — Telehealth: Payer: Self-pay | Admitting: *Deleted

## 2015-01-23 DIAGNOSIS — C50912 Malignant neoplasm of unspecified site of left female breast: Secondary | ICD-10-CM

## 2015-01-23 MED ORDER — LETROZOLE 2.5 MG PO TABS: 2.5000 mg | ORAL_TABLET | Freq: Every day | ORAL | Status: DC

## 2015-01-23 NOTE — Addendum Note (Signed)
Addended by: Betti Cruz on: 01/23/2015 02:16 PM   Modules accepted: Orders

## 2015-01-23 NOTE — Telephone Encounter (Signed)
Escribed

## 2015-01-28 ENCOUNTER — Telehealth: Payer: Self-pay | Admitting: *Deleted

## 2015-01-28 DIAGNOSIS — C50912 Malignant neoplasm of unspecified site of left female breast: Secondary | ICD-10-CM

## 2015-01-28 MED ORDER — LETROZOLE 2.5 MG PO TABS: 2.5000 mg | ORAL_TABLET | Freq: Every day | ORAL | Status: DC

## 2015-01-28 NOTE — Telephone Encounter (Signed)
Escribed

## 2015-03-11 ENCOUNTER — Ambulatory Visit (INDEPENDENT_AMBULATORY_CARE_PROVIDER_SITE_OTHER): Payer: Medicare Other | Admitting: General Surgery

## 2015-03-11 ENCOUNTER — Encounter: Payer: Self-pay | Admitting: General Surgery

## 2015-03-11 VITALS — BP 160/84 | HR 78 | Resp 14 | Ht 67.0 in | Wt 232.0 lb

## 2015-03-11 DIAGNOSIS — C50912 Malignant neoplasm of unspecified site of left female breast: Secondary | ICD-10-CM | POA: Diagnosis not present

## 2015-03-11 DIAGNOSIS — I890 Lymphedema, not elsewhere classified: Secondary | ICD-10-CM | POA: Diagnosis not present

## 2015-03-11 NOTE — Progress Notes (Addendum)
Patient ID: Faith [name], female   DOB: 04-29-1945, 70 y.o.   MRN: [id number]  Chief Complaint  Patient presents with  . Breast Cancer Long Term Follow Up    HPI Faith [name] is a 70 y.o. female. Here today for breast cancer follow up. She is wearing her lymphedema sleeve. Tolerating the letrozole and Ibrance. I have reviewed the history of present illness with the patient.   HPI  Past Medical History  Diagnosis Date  . Cancer [facility]) April 2015    left breast    Past Surgical History  Procedure Laterality Date  . Shoulder open rotator cuff repair  2012  . Wrist surgery Left 1969    cyst  . Portacath placement  06-07-13  . Breast surgery Bilateral 11/19/13    bilater mastectomy     Family History  Problem Relation Age of Onset  . Adopted: Yes    Social History Social History  Substance Use Topics  . Smoking status: Former Smoker -- 1.00 packs/day for 15 years    Types: Cigarettes    Quit date: 06/12/1985  . Smokeless tobacco: Never Used  . Alcohol Use: 0.0 oz/week    0 Standard drinks or equivalent per week    Allergies  Allergen Reactions  . Mushroom Extract Complex Hives and Swelling  . Shellfish Allergy Nausea And Vomiting    Current Outpatient Prescriptions  Medication Sig Dispense Refill  . Calcium Carbonate-Vitamin D (CALCIUM + D PO) Take 1 tablet by mouth daily.    . Cholecalciferol (VITAMIN D PO) Take 5,000 Units by mouth daily.    [name] 125 MG capsule Take 1 capsule (125 mg total) by mouth daily with breakfast. Take for 3 weeks, then 1 week off. 21 capsule 3  . ibuprofen (ADVIL,MOTRIN) 200 MG tablet Take 200 mg by mouth every 6 (six) hours as needed.    Marland Kitchen letrozole (FEMARA) 2.5 MG tablet Take 1 tablet (2.5 mg total) by mouth daily. 90 tablet 3  . ondansetron (ZOFRAN-ODT) 8 MG disintegrating tablet Take by mouth every 8 (eight) hours as needed.      No current facility-administered medications for this visit.   Facility-Administered Medications  Ordered in Other Visits  Medication Dose Route Frequency Provider Last Rate Last Dose  . sodium chloride 0.9 % injection 10 mL  10 mL Intravenous PRN [name], MD   10 mL at 09/12/14 1014    Review of Systems Review of Systems  Constitutional: Negative.   Respiratory: Negative.   Cardiovascular: Negative.     Blood pressure 160/84, pulse 78, resp. rate 14, height 5\' 7"  (1.702 m), weight 232 lb (105.235 kg).  Physical Exam Physical Exam  Constitutional: She is oriented to person, place, and time. She appears well-developed and well-nourished.  HENT:  Mouth/Throat: Oropharynx is clear and moist.  Eyes: Conjunctivae are normal. No scleral icterus.  Neck: Neck supple.  Cardiovascular: Normal rate, regular rhythm and normal heart sounds.   Pulmonary/Chest: Effort normal and breath sounds normal.  Both mastectomy sites are well healed with no sign of local recurrence.  Abdominal: Soft. Normal appearance and bowel sounds are normal. There is no tenderness.  Lymphadenopathy:    She has no cervical adenopathy.    She has no axillary adenopathy.  left arm lymphedema appears to be stable.  Neurological: She is alert and oriented to person, place, and time.  Skin: Skin is warm and dry.  Psychiatric: Her behavior is normal.    Data Reviewed Prior  notes and labs.  Assessment    CA breast, Stage 4, with no evidence of recurrence on exam and by CT.  Patient continues on Letrozole and Ibrance.     Plan    Follow up office visit in 6 months.     PCP:  Wandra Arthurs, MD   Junie Panning G 03/12/2015, 12:39 PM

## 2015-03-11 NOTE — Patient Instructions (Addendum)
The patient is aware to call back for any questions or concerns. Follow up office visit in 6 months.

## 2015-03-12 ENCOUNTER — Encounter: Payer: Self-pay | Admitting: General Surgery

## 2015-04-07 ENCOUNTER — Other Ambulatory Visit: Payer: Medicare HMO

## 2015-04-07 ENCOUNTER — Ambulatory Visit: Payer: Medicare HMO | Admitting: Oncology

## 2015-04-09 ENCOUNTER — Telehealth: Payer: Self-pay | Admitting: *Deleted

## 2015-04-09 NOTE — Telephone Encounter (Signed)
Pt needs to continue medication at this time and will contact [facility] to get update on process for free med.

## 2015-04-09 NOTE — Telephone Encounter (Signed)
Patient states she has 4 Ibrance pills left.  She is awaiting information from [facility] regarding free medication.  Wants to know if she should start back on her medication today since she only has 4 pill left?

## 2015-04-09 NOTE — Telephone Encounter (Signed)
Called patient back and left message for her to continue Ibrance at this time.  Also will contact [facility] regarding status of free drug.

## 2015-04-11 ENCOUNTER — Encounter: Payer: Self-pay | Admitting: Oncology

## 2015-04-11 ENCOUNTER — Inpatient Hospital Stay: Payer: Medicare Other

## 2015-04-11 ENCOUNTER — Inpatient Hospital Stay: Payer: Medicare Other | Attending: Oncology | Admitting: Oncology

## 2015-04-11 VITALS — BP 178/88 | HR 98 | Temp 98.1°F | Wt 231.3 lb

## 2015-04-11 DIAGNOSIS — Z79811 Long term (current) use of aromatase inhibitors: Secondary | ICD-10-CM

## 2015-04-11 DIAGNOSIS — I972 Postmastectomy lymphedema syndrome: Secondary | ICD-10-CM | POA: Diagnosis not present

## 2015-04-11 DIAGNOSIS — C773 Secondary and unspecified malignant neoplasm of axilla and upper limb lymph nodes: Secondary | ICD-10-CM | POA: Insufficient documentation

## 2015-04-11 DIAGNOSIS — Z87891 Personal history of nicotine dependence: Secondary | ICD-10-CM | POA: Diagnosis not present

## 2015-04-11 DIAGNOSIS — Z9221 Personal history of antineoplastic chemotherapy: Secondary | ICD-10-CM

## 2015-04-11 DIAGNOSIS — C50912 Malignant neoplasm of unspecified site of left female breast: Secondary | ICD-10-CM

## 2015-04-11 DIAGNOSIS — Z9013 Acquired absence of bilateral breasts and nipples: Secondary | ICD-10-CM | POA: Insufficient documentation

## 2015-04-11 DIAGNOSIS — Z79899 Other long term (current) drug therapy: Secondary | ICD-10-CM | POA: Diagnosis not present

## 2015-04-11 DIAGNOSIS — Z95828 Presence of other vascular implants and grafts: Secondary | ICD-10-CM

## 2015-04-11 DIAGNOSIS — Z923 Personal history of irradiation: Secondary | ICD-10-CM | POA: Insufficient documentation

## 2015-04-11 DIAGNOSIS — Z170 Estrogen receptor positive status [ER+]: Secondary | ICD-10-CM | POA: Insufficient documentation

## 2015-04-11 LAB — CBC WITH DIFFERENTIAL/PLATELET
Basophils Absolute: 0 10*3/uL (ref 0–0.1)
Basophils Relative: 0 %
Eosinophils Absolute: 0.1 10*3/uL (ref 0–0.7)
Eosinophils Relative: 1 %
HEMATOCRIT: 37.1 % (ref 35.0–47.0)
HEMOGLOBIN: 13.2 g/dL (ref 12.0–16.0)
LYMPHS ABS: 1.1 10*3/uL (ref 1.0–3.6)
LYMPHS PCT: 26 %
MCH: 34.4 pg — AB (ref 26.0–34.0)
MCHC: 35.7 g/dL (ref 32.0–36.0)
MCV: 96.4 fL (ref 80.0–100.0)
MONOS PCT: 10 %
Monocytes Absolute: 0.4 10*3/uL (ref 0.2–0.9)
NEUTROS ABS: 2.7 10*3/uL (ref 1.4–6.5)
NEUTROS PCT: 63 %
Platelets: 249 10*3/uL (ref 150–440)
RBC: 3.85 MIL/uL (ref 3.80–5.20)
RDW: 16 % — ABNORMAL HIGH (ref 11.5–14.5)
WBC: 4.4 10*3/uL (ref 3.6–11.0)

## 2015-04-11 LAB — COMPREHENSIVE METABOLIC PANEL
ALK PHOS: 81 U/L (ref 38–126)
ALT: 37 U/L (ref 14–54)
ANION GAP: 6 (ref 5–15)
AST: 29 U/L (ref 15–41)
Albumin: 4.2 g/dL (ref 3.5–5.0)
BILIRUBIN TOTAL: 0.6 mg/dL (ref 0.3–1.2)
BUN: 18 mg/dL (ref 6–20)
CALCIUM: 9.3 mg/dL (ref 8.9–10.3)
CO2: 26 mmol/L (ref 22–32)
CREATININE: 1.06 mg/dL — AB (ref 0.44–1.00)
Chloride: 104 mmol/L (ref 101–111)
GFR calc non Af Amer: 52 mL/min — ABNORMAL LOW (ref >60)
GFR, EST AFRICAN AMERICAN: 60 mL/min — AB (ref >60)
GLUCOSE: 142 mg/dL — AB (ref 65–99)
Potassium: 3.7 mmol/L (ref 3.5–5.1)
Sodium: 136 mmol/L (ref 135–145)
TOTAL PROTEIN: 7.6 g/dL (ref 6.5–8.1)

## 2015-04-11 MED ORDER — SODIUM CHLORIDE 0.9% FLUSH
10.0000 mL | INTRAVENOUS | Status: DC | PRN
  Administered 2015-04-11: 10 mL via INTRAVENOUS
  Filled 2015-04-11: qty 10

## 2015-04-11 MED ORDER — HEPARIN SOD (PORK) LOCK FLUSH 100 UNIT/ML IV SOLN
500.0000 [IU] | Freq: Once | INTRAVENOUS | Status: AC
  Administered 2015-04-11: 500 [IU] via INTRAVENOUS

## 2015-04-11 NOTE — Progress Notes (Signed)
[place] @ [facility] Telephone:(336) [phone]  Fax:(336) [place] OB: [birth year]  MR#: [id number]  TGY#:[id number]  Patient Care Team: [name], MD as PCP - General (Internal Medicine) [name], MD (Unknown Physician Specialty) [name], MD (General Surgery)  CHIEF COMPLAINT:  Chief Complaint  Patient presents with  . Breast Cancer    Oncology History   1. 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  Biopsy from the lymph node in the left axilla positive for metastatic memory carcinoma Estrogen receptor positive Progesterone receptor +ve hER-2/neu receptors equivocal  by [facility]  IHC for HER-2/neu is 2+ Clinically staged asT4 D. N1 M0 tumor stage IV  locally advanced carcinoma.. 2. She was started on   [employer] OF 2015. 3. Treatment was changed to Cytoxan and Adriamycinffrom August 31, 2013  4.patient has finished oral 3 cycles of chemotherapy with Cytoxan and Adriamycin on October 19, 2013. 5.Patient had a bilateral mastectomy in November of 2015. ypT4B ypN3 ypM1 STAGE iv DISEASE.. repeat HER-2/neu receptor by [facility] is still equivocal (November, 2015).  6.  Patient is on letrozole and IBRANCE     Breast cancer ([place])   11/13/2013 Initial Diagnosis Breast cancer    No flowsheet data found.  INTERVAL HISTORY 70 year old lady with history of stage IV carcinoma of breast.  Stage IV NED Presently on IBRANCE and letrozole Lymphedema left upper extremity is improved Recent is getting lymphedema therapy Tolerating IBRANCE very well.  Has myelosuppression without any fever No chills no bony pains. Here for further follow-up and treatment consideration.  Tolerating treatment very well.  No chills.  No fever.   REVIEW OF SYSTEMS:   GENERAL:  Feels good.  Active.  No fevers, sweats or weight loss. PERFORMANCE STATUS  (ECOG01 HEENT:  No visual changes, runny nose, sore throat, mouth sores or tenderness. Lungs: No shortness of breath or cough.  No hemoptysis. Cardiac:  No chest pain, palpitations, orthopnea, or PND. GI:  No nausea, vomiting, diarrhea, constipation, melena or hematochezia. GU:  No urgency, frequency, dysuria, or hematuria. Musculoskeletal:  No back pain.  No joint pain.  No muscle tenderness. Extremities:  Left upper extremity lymphedema is improving Skin:  No rashes or skin changes. Neuro:  No headache, numbness or weakness, balance or coordination issues. Endocrine:  No diabetes, thyroid issues, hot flashes or night sweats. Psych:  No mood changes, depression or anxiety. Pain:  No focal pain. Review of systems:  All other systems reviewed and found to be negative.  PAST MEDICAL HISTORY: Past Medical History  Diagnosis Date  . Cancer [facility]) April 2015    left breast    PAST SURGICAL HISTORY: Past Surgical History  Procedure Laterality Date  . Shoulder open rotator cuff repair  2012  . Wrist surgery Left 1969    cyst  . Portacath placement  06-07-13  . Breast surgery Bilateral 11/19/13    bilater mastectomy     FAMILY HISTORY Family History  Problem Relation Age of Onset  . Adopted: Yes   Allergies:  Mushrooms: Hives  Shellfish: N/V/Diarrhea  Significant History/PMH:   Breast Cancer:    Hypertension:    Denies medical history:    :   :   Preventive Screening:  Has patient had any of the following test? Mammography  Pap Smear (1)   Last Mammography: april 2015(1)  Last Pap Smear: about 2 years ago(1)   Smoking History: Smoking History quit several years ago(1)  PFSH: Comments: No family history of colorectal cancer, breast cancer, or ovarian cancer.  Social History: negative alcohol, negative tobacco  Additional Past Medical and Surgical History: no significant previous medical or surgical past history   GYNECOLOGIC HISTORY:  No LMP recorded.  Patient is postmenopausal.     ADVANCED DIRECTIVES:  Patient does not have any living will or healthcare power of attorney.  Information was given .  Available resources had been discussed.  We will follow-up on subsequent appointments regarding this issue  HEALTH MAINTENANCE: Social History  Substance Use Topics  . Smoking status: Former Smoker -- 1.00 packs/day for 15 years    Types: Cigarettes    Quit date: 06/12/1985  . Smokeless tobacco: Never Used  . Alcohol Use: 0.0 oz/week    0 Standard drinks or equivalent per week     Allergies  Allergen Reactions  . Mushroom Extract Complex Hives and Swelling  . Shellfish Allergy Nausea And Vomiting    Current Outpatient Prescriptions  Medication Sig Dispense Refill  . Calcium Carbonate-Vitamin D (CALCIUM + D PO) Take 1 tablet by mouth daily.    . Cholecalciferol (VITAMIN D PO) Take 5,000 Units by mouth daily.    [name] 125 MG capsule Take 1 capsule (125 mg total) by mouth daily with breakfast. Take for 3 weeks, then 1 week off. 21 capsule 3  . ibuprofen (ADVIL,MOTRIN) 200 MG tablet Take 200 mg by mouth every 6 (six) hours as needed.    Marland Kitchen letrozole (FEMARA) 2.5 MG tablet Take 1 tablet (2.5 mg total) by mouth daily. 90 tablet 3  . ondansetron (ZOFRAN-ODT) 8 MG disintegrating tablet Take by mouth every 8 (eight) hours as needed.      No current facility-administered medications for this visit.   Facility-Administered Medications Ordered in Other Visits  Medication Dose Route Frequency Provider Last Rate Last Dose  . sodium chloride 0.9 % injection 10 mL  10 mL Intravenous PRN [name], MD   10 mL at 09/12/14 1014    OBJECTIVE:  Filed Vitals:   04/11/15 1511  BP: 178/88  Pulse: 98  Temp: 98.1 F (36.7 C)     Body mass index is 36.21 kg/(m^2).    ECOG FS:1 - Symptomatic but completely ambulatory  PHYSICAL EXAM: GENERAL:  Well developed, well nourished, sitting comfortably in the exam room in no acute distress. MENTAL  STATUS:  Alert and oriented to person, place and time. HEAD:  Normocephalic, atraumatic, face symmetric, no Cushingoid features. EYES Pupils equal round and reactive to light and accomodation.  No conjunctivitis or scleral icterus. ENT:  Oropharynx clear without lesion.  Tongue normal. Mucous membranes moist.  RESPIRATORY:  Clear to auscultation without rales, wheezes or rhonchi. CARDIOVASCULAR:  Regular rate and rhythm without murmur, rub or gallop. BREAST:  Bilateral mastectomy.  Chest wall area no evidence of recurrent disease ABDOMEN:  Soft, non-tender, with active bowel sounds, and no hepatosplenomegaly.  No masses. BACK:  No CVA tenderness.  No tenderness on percussion of the back or rib cage. SKIN:  No rashes, ulcers or lesions. EXTREMITIES: Left upper extremity lymphedema.  Getting better LYMPH NODES: No palpable cervical, supraclavicular, axillary or inguinal adenopathy  NEUROLOGICAL: Unremarkable. PSYCH:  Appropriate.   LAB RESULTS:  Appointment on 04/11/2015  Component Date Value Ref Range Status  . WBC 04/11/2015 4.4  3.6 - 11.0 K/uL Final  . RBC 04/11/2015 3.85  3.80 - 5.20 MIL/uL Final  . Hemoglobin 04/11/2015 13.2  12.0 - 16.0 g/dL Final  . HCT 04/11/2015 37.1  35.0 - 47.0 % Final  . MCV 04/11/2015 96.4  80.0 - 100.0 fL Final  . MCH 04/11/2015 34.4* 26.0 - 34.0 pg Final  . MCHC 04/11/2015 35.7  32.0 - 36.0 g/dL Final  . RDW 04/11/2015 16.0* 11.5 - 14.5 % Final  . Platelets 04/11/2015 249  150 - 440 K/uL Final  . Neutrophils Relative % 04/11/2015 63   Final  . Neutro Abs 04/11/2015 2.7  1.4 - 6.5 K/uL Final  . Lymphocytes Relative 04/11/2015 26   Final  . Lymphs Abs 04/11/2015 1.1  1.0 - 3.6 K/uL Final  . Monocytes Relative 04/11/2015 10   Final  . Monocytes Absolute 04/11/2015 0.4  0.2 - 0.9 K/uL Final  . Eosinophils Relative 04/11/2015 1   Final  . Eosinophils Absolute 04/11/2015 0.1  0 - 0.7 K/uL Final  . Basophils Relative 04/11/2015 0   Final  . Basophils  Absolute 04/11/2015 0.0  0 - 0.1 K/uL Final  . Sodium 04/11/2015 136  135 - 145 mmol/L Final  . Potassium 04/11/2015 3.7  3.5 - 5.1 mmol/L Final  . Chloride 04/11/2015 104  101 - 111 mmol/L Final  . CO2 04/11/2015 26  22 - 32 mmol/L Final  . Glucose, Bld 04/11/2015 142* 65 - 99 mg/dL Final  . BUN 04/11/2015 18  6 - 20 mg/dL Final  . Creatinine, Ser 04/11/2015 1.06* 0.44 - 1.00 mg/dL Final  . Calcium 04/11/2015 9.3  8.9 - 10.3 mg/dL Final  . Total Protein 04/11/2015 7.6  6.5 - 8.1 g/dL Final  . Albumin 04/11/2015 4.2  3.5 - 5.0 g/dL Final  . AST 04/11/2015 29  15 - 41 U/L Final  . ALT 04/11/2015 37  14 - 54 U/L Final  . Alkaline Phosphatase 04/11/2015 81  38 - 126 U/L Final  . Total Bilirubin 04/11/2015 0.6  0.3 - 1.2 mg/dL Final  . GFR calc non Af Amer 04/11/2015 52* >60 mL/min Final  . GFR calc Af Amer 04/11/2015 60* >60 mL/min Final   Comment: (NOTE) The eGFR has been calculated using the CKD EPI equation. This calculation has not been validated in all clinical situations. eGFR's persistently <60 mL/min signify possible Chronic Kidney Disease.   . Anion gap 04/11/2015 6  5 - 15 Final    CA to 27.29 is 11.5 (March, 2017)    ASSESSMENT: 70 year old lady with history of carcinoma breast stage IV disease.  Presently on now Ibrance and letrozole therapy.  Tolerating treatment very well. Patient is continuing with letrozole and IBRANCE MEDICAL DECISION MAKING:  Continue anti-hormonal therapy Stage IV NED,   there is no clinical evidence of recurrent disease at present time. Lymphedema is improving On clinical grounds that he is no evidence of recurrent disease. No significant side effects from IBRANCE Continue letrozole and IBRANCE Repeat  Pet  scan in 3 months for reevaluation. Breast cancer   Staging form: Breast, AJCC 7th Edition     Clinical: Stage IV (T3, N3, M1) - Signed by [name], MD on 06/14/2014

## 2015-04-11 NOTE — Progress Notes (Signed)
Patient would like to discuss having port removed.  Also Dr. Jamal Collin mentioned to her about having a scan and advised her to talk to Dr. Oliva Bustard.

## 2015-04-12 ENCOUNTER — Encounter: Payer: Self-pay | Admitting: Oncology

## 2015-04-12 LAB — CANCER ANTIGEN 27.29: CA 27.29: 11.5 U/mL (ref 0.0–38.6)

## 2015-05-08 ENCOUNTER — Other Ambulatory Visit: Payer: Self-pay | Admitting: *Deleted

## 2015-05-08 MED ORDER — IBRANCE 125 MG PO CAPS: 125.0000 mg | ORAL_CAPSULE | Freq: Every day | ORAL | Status: DC

## 2015-05-08 NOTE — Telephone Encounter (Signed)
States pharmacy told her we are not responding to refill request for her [name]

## 2015-05-08 NOTE — Telephone Encounter (Signed)
We have not received notification from pharmacy that pt needed refill for Ibrance.

## 2015-06-02 ENCOUNTER — Ambulatory Visit
Admission: RE | Admit: 2015-06-02 | Discharge: 2015-06-02 | Disposition: A | Discharge status: Home / Self Care | Payer: Medicare Other | Source: Ambulatory Visit | Attending: Oncology | Admitting: Oncology

## 2015-06-02 DIAGNOSIS — Z9013 Acquired absence of bilateral breasts and nipples: Secondary | ICD-10-CM | POA: Insufficient documentation

## 2015-06-02 DIAGNOSIS — K760 Fatty (change of) liver, not elsewhere classified: Secondary | ICD-10-CM | POA: Insufficient documentation

## 2015-06-02 DIAGNOSIS — C50912 Malignant neoplasm of unspecified site of left female breast: Secondary | ICD-10-CM | POA: Insufficient documentation

## 2015-06-02 DIAGNOSIS — R911 Solitary pulmonary nodule: Secondary | ICD-10-CM | POA: Insufficient documentation

## 2015-06-02 LAB — GLUCOSE, CAPILLARY: Glucose-Capillary: 104 mg/dL — ABNORMAL HIGH (ref 65–99)

## 2015-06-02 MED ORDER — FLUDEOXYGLUCOSE F - 18 (FDG) INJECTION
12.1600 | Freq: Once | INTRAVENOUS | Status: AC | PRN
Start: 2015-06-02 — End: 2015-06-02
  Administered 2015-06-02: 12.16 via INTRAVENOUS

## 2015-06-04 ENCOUNTER — Inpatient Hospital Stay: Payer: Medicare Other | Attending: Oncology

## 2015-06-04 ENCOUNTER — Inpatient Hospital Stay (HOSPITAL_BASED_OUTPATIENT_CLINIC_OR_DEPARTMENT_OTHER): Payer: Medicare Other | Admitting: Oncology

## 2015-06-04 VITALS — BP 192/101 | HR 97 | Temp 98.3°F | Resp 18 | Wt 229.7 lb

## 2015-06-04 DIAGNOSIS — K760 Fatty (change of) liver, not elsewhere classified: Secondary | ICD-10-CM

## 2015-06-04 DIAGNOSIS — C773 Secondary and unspecified malignant neoplasm of axilla and upper limb lymph nodes: Secondary | ICD-10-CM

## 2015-06-04 DIAGNOSIS — Z9013 Acquired absence of bilateral breasts and nipples: Secondary | ICD-10-CM | POA: Diagnosis not present

## 2015-06-04 DIAGNOSIS — Z79899 Other long term (current) drug therapy: Secondary | ICD-10-CM | POA: Diagnosis not present

## 2015-06-04 DIAGNOSIS — C50912 Malignant neoplasm of unspecified site of left female breast: Secondary | ICD-10-CM | POA: Diagnosis not present

## 2015-06-04 DIAGNOSIS — I10 Essential (primary) hypertension: Secondary | ICD-10-CM

## 2015-06-04 DIAGNOSIS — Z923 Personal history of irradiation: Secondary | ICD-10-CM

## 2015-06-04 DIAGNOSIS — Z9221 Personal history of antineoplastic chemotherapy: Secondary | ICD-10-CM

## 2015-06-04 DIAGNOSIS — Z87891 Personal history of nicotine dependence: Secondary | ICD-10-CM

## 2015-06-04 DIAGNOSIS — Z79811 Long term (current) use of aromatase inhibitors: Secondary | ICD-10-CM | POA: Insufficient documentation

## 2015-06-04 DIAGNOSIS — Z170 Estrogen receptor positive status [ER+]: Secondary | ICD-10-CM

## 2015-06-04 DIAGNOSIS — I972 Postmastectomy lymphedema syndrome: Secondary | ICD-10-CM

## 2015-06-04 LAB — COMPREHENSIVE METABOLIC PANEL
ALK PHOS: 87 U/L (ref 38–126)
ALT: 24 U/L (ref 14–54)
ANION GAP: 9 (ref 5–15)
AST: 21 U/L (ref 15–41)
Albumin: 4.4 g/dL (ref 3.5–5.0)
BILIRUBIN TOTAL: 0.7 mg/dL (ref 0.3–1.2)
BUN: 23 mg/dL — AB (ref 6–20)
CALCIUM: 9.9 mg/dL (ref 8.9–10.3)
CO2: 24 mmol/L (ref 22–32)
CREATININE: 1.01 mg/dL — AB (ref 0.44–1.00)
Chloride: 104 mmol/L (ref 101–111)
GFR calc Af Amer: >60 mL/min (ref >60)
GFR, EST NON AFRICAN AMERICAN: 55 mL/min — AB (ref >60)
GLUCOSE: 116 mg/dL — AB (ref 65–99)
Potassium: 3.8 mmol/L (ref 3.5–5.1)
Sodium: 137 mmol/L (ref 135–145)
TOTAL PROTEIN: 7.9 g/dL (ref 6.5–8.1)

## 2015-06-04 LAB — CBC WITH DIFFERENTIAL/PLATELET
Basophils Absolute: 0.1 10*3/uL (ref 0–0.1)
Basophils Relative: 2 %
Eosinophils Absolute: 0.1 10*3/uL (ref 0–0.7)
Eosinophils Relative: 2 %
HEMATOCRIT: 37.6 % (ref 35.0–47.0)
HEMOGLOBIN: 13.5 g/dL (ref 12.0–16.0)
LYMPHS ABS: 1.2 10*3/uL (ref 1.0–3.6)
Lymphocytes Relative: 30 %
MCH: 34.8 pg — AB (ref 26.0–34.0)
MCHC: 35.9 g/dL (ref 32.0–36.0)
MCV: 96.9 fL (ref 80.0–100.0)
MONO ABS: 0.5 10*3/uL (ref 0.2–0.9)
MONOS PCT: 13 %
NEUTROS ABS: 2 10*3/uL (ref 1.4–6.5)
NEUTROS PCT: 53 %
Platelets: 241 10*3/uL (ref 150–440)
RBC: 3.88 MIL/uL (ref 3.80–5.20)
RDW: 16.3 % — ABNORMAL HIGH (ref 11.5–14.5)
WBC: 3.8 10*3/uL (ref 3.6–11.0)

## 2015-06-04 NOTE — Progress Notes (Signed)
Patient here for PET scan results. 

## 2015-06-05 LAB — CANCER ANTIGEN 27.29: CA 27.29: 19.5 U/mL (ref 0.0–38.6)

## 2015-06-09 ENCOUNTER — Encounter: Payer: Self-pay | Admitting: Oncology

## 2015-06-09 NOTE — Progress Notes (Signed)
Faith [name] @ [facility] [phone]  Fax:([phone]) [place] OB: 1945/05/08  MR#: [id number]  BDZ#:[id number]  Patient Care Team: [name], MD as PCP - General (Internal Medicine) [name], MD (Unknown Physician Specialty) [name], MD (General Surgery)  CHIEF COMPLAINT:  Chief Complaint  Patient presents with  . Breast Cancer        1. 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  Biopsy from the lymph node in the left axilla positive for metastatic memory carcinoma Estrogen receptor positive Progesterone receptor +ve hER-2/neu receptors equivocal  by [facility]  IHC for HER-2/neu is 2+ Clinically staged asT4 D. N1 M0 tumor stage IV  locally advanced carcinoma.. 2. She was started on   [place] OF 2015. 3. Treatment was changed to Cytoxan and Adriamycinffrom August 31, 2013  4.patient has finished oral 3 cycles of chemotherapy with Cytoxan and Adriamycin on October 19, 2013. 5.Patient had a bilateral mastectomy in November of 2015. Patient had a bilateral involvement with extensive lymphovascular invasion. ypT4B ypN3 ypM1 STAGE iv DISEASE.. repeat HER-2/neu receptor by [facility] is still equivocal (November, 2015).  6.  Patient is on letrozole and IBRANCE (from November of 2015)     Breast cancer [facility])   11/13/2013 Initial Diagnosis Breast cancer    No flowsheet data found.  INTERVAL HISTORY 70 year old lady with history of stage IV carcinoma of breast.  Stage IV NED Presently on IBRANCE and letrozole Lymphedema left upper extremity is improved Recent is getting lymphedema therapy Tolerating IBRANCE very well.  Has myelosuppression without any fever No chills no bony pains. Here for further follow-up and treatment consideration.  Tolerating treatment very well.  No chills.  No fever.  Patient had a repeat PET scan for  restaging.  He will to discuss results of the further planning of treatment.  Tolerating IBRANCE and letrozole very well without any significant bone pain or side effect.   REVIEW OF SYSTEMS:   GENERAL:  Feels good.  Active.  No fevers, sweats or weight loss. PERFORMANCE STATUS (ECOG01 HEENT:  No visual changes, runny nose, sore throat, mouth sores or tenderness. Lungs: No shortness of breath or cough.  No hemoptysis. Cardiac:  No chest pain, palpitations, orthopnea, or PND. GI:  No nausea, vomiting, diarrhea, constipation, melena or hematochezia. GU:  No urgency, frequency, dysuria, or hematuria. Musculoskeletal:  No back pain.  No joint pain.  No muscle tenderness. Extremities:  Left upper extremity lymphedema is improving Skin:  No rashes or skin changes. Neuro:  No headache, numbness or weakness, balance or coordination issues. Endocrine:  No diabetes, thyroid issues, hot flashes or night sweats. Psych:  No mood changes, depression or anxiety. Pain:  No focal pain. Review of systems:  All other systems reviewed and found to be negative.  PAST MEDICAL HISTORY: Past Medical History  Diagnosis Date  . Cancer [facility]) April 2015    left breast    PAST SURGICAL HISTORY: Past Surgical History  Procedure Laterality Date  . Shoulder open rotator cuff repair  2012  . Wrist surgery Left 1969    cyst  . Portacath placement  06-07-13  . Breast surgery Bilateral 11/19/13    bilater mastectomy     FAMILY HISTORY Family History  Problem Relation Age of Onset  . Adopted: Yes   Allergies:  Mushrooms: Hives  Shellfish: N/V/Diarrhea  Significant History/PMH:   Breast Cancer:    Hypertension:    Denies medical history:    :   :   Preventive Screening:  Has patient had any of the following test? Mammography  Pap Smear (1)   Last Mammography: april 2015(1)   Last Pap Smear: about 2 years ago(1)   Smoking History: Smoking History quit several years  ago(1)  PFSH: Comments: No family history of colorectal cancer, breast cancer, or ovarian cancer.  Social History: negative alcohol, negative tobacco  Additional Past Medical and Surgical History: no significant previous medical or surgical past history   GYNECOLOGIC HISTORY:  No LMP recorded. Patient is postmenopausal.     ADVANCED DIRECTIVES:  Patient does not have any living will or healthcare power of attorney.  Information was given .  Available resources had been discussed.  We will follow-up on subsequent appointments regarding this issue  HEALTH MAINTENANCE: Social History  Substance Use Topics  . Smoking status: Former Smoker -- 1.00 packs/day for 15 years    Types: Cigarettes    Quit date: 06/12/1985  . Smokeless tobacco: Never Used  . Alcohol Use: 0.0 oz/week    0 Standard drinks or equivalent per week     Allergies  Allergen Reactions  . Mushroom Extract Complex Hives and Swelling  . Shellfish Allergy Nausea And Vomiting    Current Outpatient Prescriptions  Medication Sig Dispense Refill  . Calcium Carbonate-Vitamin D (CALCIUM + D PO) Take 1 tablet by mouth daily.    . Cholecalciferol (VITAMIN D PO) Take 5,000 Units by mouth daily.    [name] 125 MG capsule Take 1 capsule (125 mg total) by mouth daily with breakfast. Take for 3 weeks, then 1 week off. 21 capsule 3  . ibuprofen (ADVIL,MOTRIN) 200 MG tablet Take 200 mg by mouth every 6 (six) hours as needed.    Marland Kitchen letrozole (FEMARA) 2.5 MG tablet Take 1 tablet (2.5 mg total) by mouth daily. 90 tablet 3  . ondansetron (ZOFRAN-ODT) 8 MG disintegrating tablet Take by mouth every 8 (eight) hours as needed.      No current facility-administered medications for this visit.   Facility-Administered Medications Ordered in Other Visits  Medication Dose Route Frequency Provider Last Rate Last Dose  . sodium chloride 0.9 % injection 10 mL  10 mL Intravenous PRN [name], MD   10 mL at 09/12/14 1014     OBJECTIVE:  Filed Vitals:   06/04/15 1512  BP: 192/101  Pulse: 97  Temp: 98.3 F (36.8 C)  Resp: 18     Body mass index is 35.97 kg/(m^2).    ECOG FS:1 - Symptomatic but completely ambulatory  PHYSICAL EXAM: GENERAL:  Well developed, well nourished, sitting comfortably in the exam room in no acute distress. MENTAL STATUS:  Alert and oriented to person, place and time. HEAD:  Normocephalic, atraumatic, face symmetric, no Cushingoid features. EYES Pupils equal round and reactive to light and accomodation.  No conjunctivitis or scleral icterus. ENT:  Oropharynx clear without lesion.  Tongue normal. Mucous membranes moist.  RESPIRATORY:  Clear to auscultation without rales, wheezes or rhonchi. CARDIOVASCULAR:  Regular rate and rhythm without murmur, rub or gallop. BREAST:  Bilateral mastectomy.  Chest wall area no evidence of recurrent disease ABDOMEN:  Soft, non-tender, with active bowel sounds, and no hepatosplenomegaly.  No masses. BACK:  No CVA tenderness.  No tenderness on percussion of the back or rib cage. SKIN:  No rashes, ulcers or lesions. EXTREMITIES: Left upper  extremity lymphedema.  Getting better LYMPH NODES: No palpable cervical, supraclavicular, axillary or inguinal adenopathy  NEUROLOGICAL: Unremarkable. PSYCH:  Appropriate.   LAB RESULTS:  Appointment on 06/04/2015  Component Date Value Ref Range Status  . WBC 06/04/2015 3.8  3.6 - 11.0 K/uL Final  . RBC 06/04/2015 3.88  3.80 - 5.20 MIL/uL Final  . Hemoglobin 06/04/2015 13.5  12.0 - 16.0 g/dL Final  . HCT 06/04/2015 37.6  35.0 - 47.0 % Final  . MCV 06/04/2015 96.9  80.0 - 100.0 fL Final  . MCH 06/04/2015 34.8* 26.0 - 34.0 pg Final  . MCHC 06/04/2015 35.9  32.0 - 36.0 g/dL Final  . RDW 06/04/2015 16.3* 11.5 - 14.5 % Final  . Platelets 06/04/2015 241  150 - 440 K/uL Final  . Neutrophils Relative % 06/04/2015 53   Final  . Neutro Abs 06/04/2015 2.0  1.4 - 6.5 K/uL Final  . Lymphocytes Relative 06/04/2015  30   Final  . Lymphs Abs 06/04/2015 1.2  1.0 - 3.6 K/uL Final  . Monocytes Relative 06/04/2015 13   Final  . Monocytes Absolute 06/04/2015 0.5  0.2 - 0.9 K/uL Final  . Eosinophils Relative 06/04/2015 2   Final  . Eosinophils Absolute 06/04/2015 0.1  0 - 0.7 K/uL Final  . Basophils Relative 06/04/2015 2   Final  . Basophils Absolute 06/04/2015 0.1  0 - 0.1 K/uL Final  . Sodium 06/04/2015 137  135 - 145 mmol/L Final  . Potassium 06/04/2015 3.8  3.5 - 5.1 mmol/L Final  . Chloride 06/04/2015 104  101 - 111 mmol/L Final  . CO2 06/04/2015 24  22 - 32 mmol/L Final  . Glucose, Bld 06/04/2015 116* 65 - 99 mg/dL Final  . BUN 06/04/2015 23* 6 - 20 mg/dL Final  . Creatinine, Ser 06/04/2015 1.01* 0.44 - 1.00 mg/dL Final  . Calcium 06/04/2015 9.9  8.9 - 10.3 mg/dL Final  . Total Protein 06/04/2015 7.9  6.5 - 8.1 g/dL Final  . Albumin 06/04/2015 4.4  3.5 - 5.0 g/dL Final  . AST 06/04/2015 21  15 - 41 U/L Final  . ALT 06/04/2015 24  14 - 54 U/L Final  . Alkaline Phosphatase 06/04/2015 87  38 - 126 U/L Final  . Total Bilirubin 06/04/2015 0.7  0.3 - 1.2 mg/dL Final  . GFR calc non Af Amer 06/04/2015 55* >60 mL/min Final  . GFR calc Af Amer 06/04/2015 >60  >60 mL/min Final   Comment: (NOTE) The eGFR has been calculated using the CKD EPI equation. This calculation has not been validated in all clinical situations. eGFR's persistently <60 mL/min signify possible Chronic Kidney Disease.   . Anion gap 06/04/2015 9  5 - 15 Final  . CA 27.29 06/04/2015 19.5  0.0 - 38.6 U/mL Final   Comment: (NOTE) Bayer Centaur/ACS methodology Performed At: [facility] [address] [place], [place] [id number] [place] [id number]   Hospital Outpatient Visit on 06/02/2015  Component Date Value Ref Range Status  . Glucose-Capillary 06/02/2015 104* 65 - 99 mg/dL Final    IMPRESSION: 1. Status post bilateral mastectomy. No evidence of hypermetabolic recurrent or residual disease. 2. Suspicion  of a right middle lobe pulmonary nodule, suboptimally evaluated. Recommend attention on follow-up. 3. Hepatic steatosis.   Electronically Signed  By: Abigail Miyamoto M.D.  On: 06/02/2015 11:37    ASSESSMENT: 70 year old lady with history of carcinoma breast stage IV disease.  Presently on now Ibrance and letrozole therapy.  Tolerating treatment very well.  Patient is continuing with letrozole and IBRANCE MEDICAL DECISION MAKING:  Continue anti-hormonal therapy Stage IV NED,   there is no clinical evidence of recurrent disease at present time. Lymphedema is improving On clinical grounds that he is no evidence of recurrent disease. No significant side effects from IBRANCE Continue letrozole and IBRANCE Repeat PET scan has been reviewed independently shows no evidence of progressive disease.  There is a right middle lobe pulmonary nodule which is suboptimally evaluated and will be followed Tumor markers are stable Symptomatically patient has no evidence of recurrent disease Continue IBRANCE and letrozole Periodic bone density evaluation.  1. 3 months, see Dr. Jacinto Reap in [place] with cbc, met c, ca27. Breast cancer   Staging form: Breast, AJCC 7th Edition     Clinical: Stage IV (T3, N3, M1) - Signed by [name], MD on 06/14/2014

## 2015-06-24 ENCOUNTER — Ambulatory Visit (INDEPENDENT_AMBULATORY_CARE_PROVIDER_SITE_OTHER): Payer: Medicare Other | Admitting: General Surgery

## 2015-06-24 ENCOUNTER — Encounter: Payer: Self-pay | Admitting: General Surgery

## 2015-06-24 VITALS — BP 160/82 | HR 76 | Resp 14 | Ht 67.0 in | Wt 231.0 lb

## 2015-06-24 DIAGNOSIS — C50912 Malignant neoplasm of unspecified site of left female breast: Secondary | ICD-10-CM | POA: Diagnosis not present

## 2015-06-24 NOTE — Progress Notes (Signed)
This is a 70 year old female here today for her port removal. Pt has been cleared by oncologist to have port removed.   Procedure Removal of Venous Access Port:  Anesthetic 10 mL 1% Xylocaine mixed with 0.5% Marcaine  Prep: Chloro Prep  Description:  After local anesthetic and prep, area was draped out.  Prior incision was reopened.  Capsule around the port was reopened and the port freed.  Three anchoring stiches were removed.  Port and catheter were removed in entirety without difficulty. Subcutaneous tissue was closed with 3.0 Vicryl and skin was closed with 4.0 Vicryl subcutaneous stitch. Steri strips, telfa tegaderm dressing were placed.   Procedure was well tolerated. Pt advised on wound care.  Follow-up as scheduled.           PCP:  No Pcp  This information has been scribed by Gaspar Cola CMA.

## 2015-06-24 NOTE — Patient Instructions (Addendum)
Return as scheduled 

## 2015-06-25 ENCOUNTER — Encounter: Payer: Self-pay | Admitting: General Surgery

## 2015-08-25 ENCOUNTER — Encounter: Payer: Self-pay | Admitting: *Deleted

## 2015-08-25 ENCOUNTER — Other Ambulatory Visit: Payer: Self-pay | Admitting: *Deleted

## 2015-08-25 MED ORDER — IBRANCE 125 MG PO CAPS: 125.0000 mg | ORAL_CAPSULE | Freq: Every day | ORAL | 3 refills | Status: DC

## 2015-08-25 NOTE — Telephone Encounter (Signed)
Called to request Ibrance rx refill,as she is to restart cycle on Thursday and she does not have an appt until the 22nd

## 2015-08-25 NOTE — Progress Notes (Signed)
rx for ibrance-renewed and faxed to [facility].  Faxed confirmation received.

## 2015-08-28 ENCOUNTER — Telehealth: Payer: Self-pay | Admitting: *Deleted

## 2015-08-28 MED ORDER — IBRANCE 125 MG PO CAPS: 125.0000 mg | ORAL_CAPSULE | Freq: Every day | ORAL | 3 refills | Status: DC

## 2015-08-28 NOTE — Telephone Encounter (Signed)
[facility] reports they did not get Rx fax for [employer]. Asking we call them.  I resubmitted by escribe the prescription faxed on 8/7

## 2015-09-01 ENCOUNTER — Encounter: Payer: Self-pay | Admitting: General Surgery

## 2015-09-01 ENCOUNTER — Ambulatory Visit (INDEPENDENT_AMBULATORY_CARE_PROVIDER_SITE_OTHER): Payer: Medicare Other | Admitting: General Surgery

## 2015-09-01 VITALS — BP 162/80 | HR 78 | Resp 14 | Ht 67.0 in | Wt 230.0 lb

## 2015-09-01 DIAGNOSIS — C50912 Malignant neoplasm of unspecified site of left female breast: Secondary | ICD-10-CM

## 2015-09-01 NOTE — Patient Instructions (Addendum)
Patient to return in six months  

## 2015-09-01 NOTE — Progress Notes (Signed)
Patient ID: Faith [name], female   DOB: 10-26-1945, 70 y.o.   MRN: [id number]  Chief Complaint  Patient presents with  . Follow-up    HPI Faith [name] is a 70 y.o. female Here today for Stage IV (T3 N3 M1) left breast cancer follow up s/p bilateral mastectomy (2015). She is currently on letrozole and Ibrance, tolerating them well. Patient has considerable lymphedema of the upper extremities, L>R. She is not  wearing her lymphedema sleeve. She pumps once daily. Denies any pain or discomfort. I have reviewed the history of present illness with the patient.  HPI  Past Medical History:  Diagnosis Date  . Cancer [facility]) April 2015   left breast    Past Surgical History:  Procedure Laterality Date  . BREAST SURGERY Bilateral 11/19/13   bilater mastectomy   . PORTACATH PLACEMENT  06-07-13  . SHOULDER OPEN ROTATOR CUFF REPAIR  2012  . WRIST SURGERY Left 1969   cyst    Family History  Problem Relation Age of Onset  . Adopted: Yes    Social History Social History  Substance Use Topics  . Smoking status: Former Smoker    Packs/day: 1.00    Years: 15.00    Types: Cigarettes    Quit date: 06/12/1985  . Smokeless tobacco: Never Used  . Alcohol use 0.0 oz/week    Allergies  Allergen Reactions  . Mushroom Extract Complex Hives and Swelling  . Shellfish Allergy Nausea And Vomiting    Current Outpatient Prescriptions  Medication Sig Dispense Refill  . Calcium Carbonate-Vitamin D (CALCIUM + D PO) Take 1 tablet by mouth daily.    . Cholecalciferol (VITAMIN D PO) Take 5,000 Units by mouth daily.    [name] 125 MG capsule Take 1 capsule (125 mg total) by mouth daily with breakfast. Take for 3 weeks, then 1 week off. 21 capsule 3  . ibuprofen (ADVIL,MOTRIN) 200 MG tablet Take 200 mg by mouth every 6 (six) hours as needed.    Marland Kitchen letrozole (FEMARA) 2.5 MG tablet Take 1 tablet (2.5 mg total) by mouth daily. 90 tablet 3  . ondansetron (ZOFRAN-ODT) 8 MG disintegrating tablet Take by mouth  every 8 (eight) hours as needed.      No current facility-administered medications for this visit.    Facility-Administered Medications Ordered in Other Visits  Medication Dose Route Frequency Provider Last Rate Last Dose  . sodium chloride 0.9 % injection 10 mL  10 mL Intravenous PRN [name], MD   10 mL at 09/12/14 1014    Review of Systems Review of Systems  Constitutional: Negative.   Respiratory: Negative.   Cardiovascular: Negative.     Blood pressure (!) 162/80, pulse 78, resp. rate 14, height 5\' 7"  (1.702 m), weight 230 lb (104.3 kg).  Physical Exam Physical Exam  Constitutional: She is oriented to person, place, and time. She appears well-developed and well-nourished.  Eyes: Conjunctivae are normal. No scleral icterus.  Neck: Neck supple.  Cardiovascular: Normal rate, regular rhythm and normal heart sounds.   Pulmonary/Chest: Effort normal and breath sounds normal.    Abdominal: Soft. Bowel sounds are normal. There is no tenderness.  Lymphadenopathy:    She has no cervical adenopathy.    She has no axillary adenopathy.  Neurological: She is alert and oriented to person, place, and time.  Skin: Skin is warm and dry.    Data Reviewed Prior notes reviewed  Assessment    CA breast, Stage IV (T3 N3 M1) ,  with no evidence of recurrence on exam. She is to continue on Letrazole and [place]    Patient to return in six months.    This information has been scribed by Gaspar Cola CMA.     Leondra Cullin G 09/01/2015, 11:03 AM

## 2015-09-09 ENCOUNTER — Inpatient Hospital Stay (HOSPITAL_BASED_OUTPATIENT_CLINIC_OR_DEPARTMENT_OTHER): Payer: Medicare Other | Admitting: Internal Medicine

## 2015-09-09 ENCOUNTER — Inpatient Hospital Stay: Payer: Medicare Other | Attending: Internal Medicine

## 2015-09-09 DIAGNOSIS — Z170 Estrogen receptor positive status [ER+]: Secondary | ICD-10-CM | POA: Insufficient documentation

## 2015-09-09 DIAGNOSIS — Z79899 Other long term (current) drug therapy: Secondary | ICD-10-CM | POA: Insufficient documentation

## 2015-09-09 DIAGNOSIS — C50812 Malignant neoplasm of overlapping sites of left female breast: Secondary | ICD-10-CM | POA: Insufficient documentation

## 2015-09-09 DIAGNOSIS — Z9013 Acquired absence of bilateral breasts and nipples: Secondary | ICD-10-CM

## 2015-09-09 DIAGNOSIS — R918 Other nonspecific abnormal finding of lung field: Secondary | ICD-10-CM | POA: Diagnosis not present

## 2015-09-09 DIAGNOSIS — Z87891 Personal history of nicotine dependence: Secondary | ICD-10-CM

## 2015-09-09 DIAGNOSIS — Z79811 Long term (current) use of aromatase inhibitors: Secondary | ICD-10-CM | POA: Insufficient documentation

## 2015-09-09 DIAGNOSIS — C50912 Malignant neoplasm of unspecified site of left female breast: Secondary | ICD-10-CM

## 2015-09-09 DIAGNOSIS — K760 Fatty (change of) liver, not elsewhere classified: Secondary | ICD-10-CM | POA: Insufficient documentation

## 2015-09-09 DIAGNOSIS — Z9221 Personal history of antineoplastic chemotherapy: Secondary | ICD-10-CM | POA: Diagnosis not present

## 2015-09-09 LAB — COMPREHENSIVE METABOLIC PANEL
ALBUMIN: 4.3 g/dL (ref 3.5–5.0)
ALK PHOS: 81 U/L (ref 38–126)
ALT: 23 U/L (ref 14–54)
ANION GAP: 6 (ref 5–15)
AST: 19 U/L (ref 15–41)
BILIRUBIN TOTAL: 0.9 mg/dL (ref 0.3–1.2)
BUN: 19 mg/dL (ref 6–20)
CALCIUM: 10 mg/dL (ref 8.9–10.3)
CO2: 28 mmol/L (ref 22–32)
CREATININE: 1.05 mg/dL — AB (ref 0.44–1.00)
Chloride: 104 mmol/L (ref 101–111)
GFR calc Af Amer: >60 mL/min (ref >60)
GFR calc non Af Amer: 52 mL/min — ABNORMAL LOW (ref >60)
GLUCOSE: 116 mg/dL — AB (ref 65–99)
Potassium: 4.5 mmol/L (ref 3.5–5.1)
SODIUM: 138 mmol/L (ref 135–145)
TOTAL PROTEIN: 7.6 g/dL (ref 6.5–8.1)

## 2015-09-09 LAB — CBC WITH DIFFERENTIAL/PLATELET
Basophils Absolute: 0.1 10*3/uL (ref 0–0.1)
Basophils Relative: 1 %
EOS ABS: 0.1 10*3/uL (ref 0–0.7)
Eosinophils Relative: 2 %
HEMATOCRIT: 39.9 % (ref 35.0–47.0)
HEMOGLOBIN: 13.7 g/dL (ref 12.0–16.0)
LYMPHS ABS: 1 10*3/uL (ref 1.0–3.6)
LYMPHS PCT: 24 %
MCH: 33.8 pg (ref 26.0–34.0)
MCHC: 34.3 g/dL (ref 32.0–36.0)
MCV: 98.6 fL (ref 80.0–100.0)
Monocytes Absolute: 0.2 10*3/uL (ref 0.2–0.9)
Monocytes Relative: 6 %
NEUTROS PCT: 67 %
Neutro Abs: 2.8 10*3/uL (ref 1.4–6.5)
Platelets: 321 10*3/uL (ref 150–440)
RBC: 4.04 MIL/uL (ref 3.80–5.20)
RDW: 15.2 % — ABNORMAL HIGH (ref 11.5–14.5)
WBC: 4.1 10*3/uL (ref 3.6–11.0)

## 2015-09-09 NOTE — Progress Notes (Signed)
[place] OFFICE PROGRESS NOTE  Patient Care Team: No Pcp Per Patient as PCP - General (General Practice) [name], MD (Unknown Physician Specialty) Seeplaputhur Robinette Haines, MD (General Surgery)  Breast cancer [facility])   Staging form: Breast, AJCC 7th Edition   - Clinical: Stage IV (T3, N3, M1) - Signed by [name], MD on 06/14/2014   Oncology History   1. 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  Biopsy from the lymph node in the left axilla positive for metastatic memory carcinoma Estrogen receptor positive Progesterone receptor +ve hER-2/neu receptors equivocal  by [facility]  IHC for HER-2/neu is 2+ Clinically staged asT4 D. N1 M0 tumor stage IV  locally advanced carcinoma.. 2. She was started on   [place] OF 2015. 3. Treatment was changed to Cytoxan and Adriamycinffrom August 31, 2013  4.patient has finished oral 3 cycles of chemotherapy with Cytoxan and Adriamycin on October 19, 2013. 5.Patient had a bilateral mastectomy in November of 2015. ypT4B ypN3 ypM1 STAGE iv DISEASE.. repeat HER-2/neu receptor by [facility] is still equivocal (November, 2015).  6.  Started on Nov 2015-  letrozole and IBRANCE ; PET may 2017- NED   # RML 4 mm nodule-      Breast cancer ([place])   11/13/2013 Initial Diagnosis    Breast cancer       Cancer of overlapping sites of left female breast [facility])    This is my first interaction with the patient as patient's primary oncologist has been Dr.Choksi. I reviewed the patient's prior charts/pertinent labs/imaging in detail; findings are summarized above.    INTERVAL HISTORY:  Faith [name] 70 y.o.  female pleasant patient above history of Stage IV breast cancer/ NED currently on Femara plus ibrance since November 2015 is here for follow-up.  Patient denies any unusual aches and pains. No shortness of breath or chest pain.  Denies any headaches. No nausea no vomiting. No diarrhea. No fevers.  REVIEW OF SYSTEMS:  A complete 10 point review of system is done which is negative except mentioned above/history of present illness.   PAST MEDICAL HISTORY :  Past Medical History:  Diagnosis Date  . Cancer [facility]) April 2015   left breast    PAST SURGICAL HISTORY :   Past Surgical History:  Procedure Laterality Date  . BREAST SURGERY Bilateral 11/19/13   bilater mastectomy   . PORTACATH PLACEMENT  06-07-13  . SHOULDER OPEN ROTATOR CUFF REPAIR  2012  . WRIST SURGERY Left 1969   cyst    FAMILY HISTORY :   Family History  Problem Relation Age of Onset  . Adopted: Yes    SOCIAL HISTORY:   Social History  Substance Use Topics  . Smoking status: Former Smoker    Packs/day: 1.00    Years: 15.00    Types: Cigarettes    Quit date: 06/12/1985  . Smokeless tobacco: Never Used  . Alcohol use 0.0 oz/week    ALLERGIES:  is allergic to mushroom extract complex and shellfish allergy.  MEDICATIONS:  Current Outpatient Prescriptions  Medication Sig Dispense Refill  . Calcium Carbonate-Vitamin D (CALCIUM + D PO) Take 1 tablet by mouth daily.    . Cholecalciferol (VITAMIN D PO) Take 5,000 Units by mouth daily.    [name] 125 MG capsule Take 1 capsule (125 mg total) by mouth daily with breakfast. Take for 3 weeks,  then 1 week off. 21 capsule 3  . ibuprofen (ADVIL,MOTRIN) 200 MG tablet Take 200 mg by mouth every 6 (six) hours as needed.    Marland Kitchen letrozole (FEMARA) 2.5 MG tablet Take 1 tablet (2.5 mg total) by mouth daily. 90 tablet 3  . ondansetron (ZOFRAN-ODT) 8 MG disintegrating tablet Take by mouth every 8 (eight) hours as needed.      No current facility-administered medications for this visit.    Facility-Administered Medications Ordered in Other Visits  Medication Dose Route Frequency Provider Last Rate Last Dose  . sodium chloride 0.9 % injection 10 mL  10 mL Intravenous PRN [name], MD   10 mL at 09/12/14  1014    PHYSICAL EXAMINATION: ECOG PERFORMANCE STATUS: 0 - Asymptomatic  BP (!) 174/79 (BP Location: Right Arm, Patient Position: Sitting)   Pulse 82   Temp 97.5 F (36.4 C) (Tympanic)   Resp 17   Ht '5\' 7"'  (1.702 m)   Wt 233 lb 5.7 oz (105.8 kg)   BMI 36.55 kg/m   Filed Weights   09/09/15 1022  Weight: 233 lb 5.7 oz (105.8 kg)    GENERAL: Well-nourished well-developed; Alert, no distress and comfortable.  Alone. EYES: no pallor or icterus OROPHARYNX: no thrush or ulceration; good dentition  NECK: supple, no masses felt LYMPH:  no palpable lymphadenopathy in the cervical, axillary or inguinal regions LUNGS: clear to auscultation and  No wheeze or crackles HEART/CVS: regular rate & rhythm and no murmurs; No lower extremity edema ABDOMEN:abdomen soft, non-tender and normal bowel sounds Musculoskeletal:no cyanosis of digits and no clubbing  PSYCH: alert & oriented x 3 with fluent speech NEURO: no focal motor/sensory deficits SKIN:  no rashes or significant lesions  LABORATORY DATA:  I have reviewed the data as listed    Component Value Date/Time   NA 138 09/09/2015 0945   NA 137 05/16/2014 1154   K 4.5 09/09/2015 0945   K 3.9 05/16/2014 1154   CL 104 09/09/2015 0945   CL 104 05/16/2014 1154   CO2 28 09/09/2015 0945   CO2 25 05/16/2014 1154   GLUCOSE 116 (H) 09/09/2015 0945   GLUCOSE 110 (H) 05/16/2014 1154   BUN 19 09/09/2015 0945   BUN 20 05/16/2014 1154   CREATININE 1.05 (H) 09/09/2015 0945   CREATININE 1.00 05/16/2014 1154   CALCIUM 10.0 09/09/2015 0945   CALCIUM 9.6 05/16/2014 1154   PROT 7.6 09/09/2015 0945   PROT 7.4 05/16/2014 1154   ALBUMIN 4.3 09/09/2015 0945   ALBUMIN 4.0 05/16/2014 1154   AST 19 09/09/2015 0945   AST 26 05/16/2014 1154   ALT 23 09/09/2015 0945   ALT 37 05/16/2014 1154   ALKPHOS 81 09/09/2015 0945   ALKPHOS 72 05/16/2014 1154   BILITOT 0.9 09/09/2015 0945   BILITOT 0.5 05/16/2014 1154   GFRNONAA 52 (L) 09/09/2015 0945    GFRNONAA 57 (L) 05/16/2014 1154   GFRAA >60 09/09/2015 0945   GFRAA >60 05/16/2014 1154    No results found for: SPEP, UPEP  Lab Results  Component Value Date   WBC 4.1 09/09/2015   NEUTROABS 2.8 09/09/2015   HGB 13.7 09/09/2015   HCT 39.9 09/09/2015   MCV 98.6 09/09/2015   PLT 321 09/09/2015      Chemistry      Component Value Date/Time   NA 138 09/09/2015 0945   NA 137 05/16/2014 1154   K 4.5 09/09/2015 0945   K 3.9 05/16/2014 1154   CL 104 09/09/2015 0945  CL 104 05/16/2014 1154   CO2 28 09/09/2015 0945   CO2 25 05/16/2014 1154   BUN 19 09/09/2015 0945   BUN 20 05/16/2014 1154   CREATININE 1.05 (H) 09/09/2015 0945   CREATININE 1.00 05/16/2014 1154      Component Value Date/Time   CALCIUM 10.0 09/09/2015 0945   CALCIUM 9.6 05/16/2014 1154   ALKPHOS 81 09/09/2015 0945   ALKPHOS 72 05/16/2014 1154   AST 19 09/09/2015 0945   AST 26 05/16/2014 1154   ALT 23 09/09/2015 0945   ALT 37 05/16/2014 1154   BILITOT 0.9 09/09/2015 0945   BILITOT 0.5 05/16/2014 1154     IMPRESSION: 1. Status post bilateral mastectomy. No evidence of hypermetabolic recurrent or residual disease. 2. Suspicion of a right middle lobe pulmonary nodule, suboptimally evaluated. Recommend attention on follow-up. 3. Hepatic steatosis.   Electronically Signed   By: Abigail Miyamoto M.D.   On: 06/02/2015 11:37  RADIOGRAPHIC STUDIES: I have personally reviewed the radiological images as listed and agreed with the findings in the report. No results found.   ASSESSMENT & PLAN:  Cancer of overlapping sites of left female breast ([place]) Breast cancer stage IV NED- currently on letrozole and ibrance. Clinically no evidence of progression. CBC CMP within normal limits. Recommend CT chest and pelvis in 3 months.  # Right middle lobe lung nodule 4 mm- a weight above imaging in 3 months  # Follow-up in 3 months/CBC CMP CA 27- 29/scan prior to that visit  # 25 minutes face-to-face with the patient  discussing the above plan of care; more than 50% of time spent on prognosis/ natural history; counseling and coordination.   I reviewed the images myself and with the patient- summarized as above.     Orders Placed This Encounter  Procedures  . CT ABDOMEN PELVIS W CONTRAST    Standing Status:   Future    Standing Expiration Date:   12/08/2016    Order Specific Question:   Reason for Exam (SYMPTOM  OR DIAGNOSIS REQUIRED)    Answer:   BREAST CANCER    Order Specific Question:   Preferred imaging location?    Answer:   [facility]  . CT CHEST W CONTRAST    Standing Status:   Future    Standing Expiration Date:   11/08/2016    Order Specific Question:   Reason for Exam (SYMPTOM  OR DIAGNOSIS REQUIRED)    Answer:   BREAST CANCER    Order Specific Question:   Preferred imaging location?    Answer:   [facility]  . CBC with Differential    Standing Status:   Future    Standing Expiration Date:   09/08/2016  . Comprehensive metabolic panel    Standing Status:   Future    Standing Expiration Date:   09/08/2016  . Cancer antigen 27.29    Standing Status:   Future    Standing Expiration Date:   09/08/2016   All questions were answered. The patient knows to call the clinic with any problems, questions or concerns.      Cammie Sickle, MD 09/09/2015 1:11 PM

## 2015-09-09 NOTE — Assessment & Plan Note (Addendum)
Breast cancer stage IV NED- currently on letrozole and ibrance. Clinically no evidence of progression. CBC CMP within normal limits. Recommend CT chest and pelvis in 3 months.  # Right middle lobe lung nodule 4 mm- a weight above imaging in 3 months  # Follow-up in 3 months/CBC CMP CA 27- 29/scan prior to that visit  # 25 minutes face-to-face with the patient discussing the above plan of care; more than 50% of time spent on prognosis/ natural history; counseling and coordination.   I reviewed the images myself and with the patient- summarized as above.

## 2015-09-09 NOTE — Progress Notes (Signed)
Pt reports only concern is the "nerve like issues" she thinks it comes with the Ibrance or Femara.  Thinks it is more related to the the IBrance.

## 2015-09-10 LAB — CANCER ANTIGEN 27.29: CA 27.29: 17.3 U/mL (ref 0.0–38.6)

## 2015-11-03 DIAGNOSIS — I219 Acute myocardial infarction, unspecified: Secondary | ICD-10-CM

## 2015-11-03 HISTORY — DX: Acute myocardial infarction, unspecified: I21.9

## 2015-11-19 ENCOUNTER — Ambulatory Visit
Admission: RE | Admit: 2015-11-19 | Discharge: 2015-11-19 | Disposition: A | Discharge status: Home / Self Care | Payer: Medicare Other | Source: Ambulatory Visit | Attending: Internal Medicine | Admitting: Internal Medicine

## 2015-11-19 ENCOUNTER — Inpatient Hospital Stay: Payer: Medicare Other | Attending: Internal Medicine

## 2015-11-19 DIAGNOSIS — C773 Secondary and unspecified malignant neoplasm of axilla and upper limb lymph nodes: Secondary | ICD-10-CM | POA: Diagnosis not present

## 2015-11-19 DIAGNOSIS — Z9221 Personal history of antineoplastic chemotherapy: Secondary | ICD-10-CM | POA: Insufficient documentation

## 2015-11-19 DIAGNOSIS — I252 Old myocardial infarction: Secondary | ICD-10-CM | POA: Insufficient documentation

## 2015-11-19 DIAGNOSIS — Z87891 Personal history of nicotine dependence: Secondary | ICD-10-CM | POA: Diagnosis not present

## 2015-11-19 DIAGNOSIS — I10 Essential (primary) hypertension: Secondary | ICD-10-CM | POA: Insufficient documentation

## 2015-11-19 DIAGNOSIS — C50812 Malignant neoplasm of overlapping sites of left female breast: Secondary | ICD-10-CM | POA: Insufficient documentation

## 2015-11-19 DIAGNOSIS — Z79899 Other long term (current) drug therapy: Secondary | ICD-10-CM | POA: Insufficient documentation

## 2015-11-19 DIAGNOSIS — Z79811 Long term (current) use of aromatase inhibitors: Secondary | ICD-10-CM | POA: Insufficient documentation

## 2015-11-19 DIAGNOSIS — Z170 Estrogen receptor positive status [ER+]: Secondary | ICD-10-CM | POA: Insufficient documentation

## 2015-11-19 DIAGNOSIS — Z9013 Acquired absence of bilateral breasts and nipples: Secondary | ICD-10-CM | POA: Diagnosis not present

## 2015-11-19 DIAGNOSIS — R110 Nausea: Secondary | ICD-10-CM | POA: Diagnosis not present

## 2015-11-19 DIAGNOSIS — K5730 Diverticulosis of large intestine without perforation or abscess without bleeding: Secondary | ICD-10-CM | POA: Insufficient documentation

## 2015-11-19 DIAGNOSIS — Z7982 Long term (current) use of aspirin: Secondary | ICD-10-CM | POA: Diagnosis not present

## 2015-11-19 HISTORY — DX: Essential (primary) hypertension: I10

## 2015-11-19 LAB — COMPREHENSIVE METABOLIC PANEL
ALBUMIN: 4.1 g/dL (ref 3.5–5.0)
ALK PHOS: 94 U/L (ref 38–126)
ALT: 22 U/L (ref 14–54)
ANION GAP: 10 (ref 5–15)
AST: 20 U/L (ref 15–41)
BUN: 19 mg/dL (ref 6–20)
CALCIUM: 9.5 mg/dL (ref 8.9–10.3)
CO2: 25 mmol/L (ref 22–32)
CREATININE: 1.09 mg/dL — AB (ref 0.44–1.00)
Chloride: 100 mmol/L — ABNORMAL LOW (ref 101–111)
GFR calc Af Amer: 57 mL/min — ABNORMAL LOW (ref >60)
GFR calc non Af Amer: 49 mL/min — ABNORMAL LOW (ref >60)
GLUCOSE: 115 mg/dL — AB (ref 65–99)
Potassium: 4.4 mmol/L (ref 3.5–5.1)
Sodium: 135 mmol/L (ref 135–145)
TOTAL PROTEIN: 7.7 g/dL (ref 6.5–8.1)
Total Bilirubin: 0.6 mg/dL (ref 0.3–1.2)

## 2015-11-19 LAB — CBC WITH DIFFERENTIAL/PLATELET
BASOS PCT: 2 %
Basophils Absolute: 0.1 10*3/uL (ref 0–0.1)
Eosinophils Absolute: 0.1 10*3/uL (ref 0–0.7)
Eosinophils Relative: 2 %
HEMATOCRIT: 36.4 % (ref 35.0–47.0)
HEMOGLOBIN: 12.5 g/dL (ref 12.0–16.0)
Lymphocytes Relative: 24 %
Lymphs Abs: 1.1 10*3/uL (ref 1.0–3.6)
MCH: 34.5 pg — ABNORMAL HIGH (ref 26.0–34.0)
MCHC: 34.5 g/dL (ref 32.0–36.0)
MCV: 100.1 fL — ABNORMAL HIGH (ref 80.0–100.0)
MONOS PCT: 12 %
Monocytes Absolute: 0.5 10*3/uL (ref 0.2–0.9)
NEUTROS ABS: 2.7 10*3/uL (ref 1.4–6.5)
NEUTROS PCT: 60 %
Platelets: 252 10*3/uL (ref 150–440)
RBC: 3.63 MIL/uL — AB (ref 3.80–5.20)
RDW: 15.8 % — ABNORMAL HIGH (ref 11.5–14.5)
WBC: 4.6 10*3/uL (ref 3.6–11.0)

## 2015-11-19 MED ORDER — IOPAMIDOL (ISOVUE-300) INJECTION 61%
75.0000 mL | Freq: Once | INTRAVENOUS | Status: AC | PRN
  Administered 2015-11-19: 75 mL via INTRAVENOUS

## 2015-11-20 LAB — CANCER ANTIGEN 27.29: CA 27.29: 15.7 U/mL (ref 0.0–38.6)

## 2015-11-25 ENCOUNTER — Inpatient Hospital Stay (HOSPITAL_BASED_OUTPATIENT_CLINIC_OR_DEPARTMENT_OTHER): Payer: Medicare Other | Admitting: Internal Medicine

## 2015-11-25 VITALS — BP 127/77 | HR 75 | Temp 98.7°F | Resp 18 | Wt 230.2 lb

## 2015-11-25 DIAGNOSIS — Z170 Estrogen receptor positive status [ER+]: Secondary | ICD-10-CM | POA: Diagnosis not present

## 2015-11-25 DIAGNOSIS — Z9013 Acquired absence of bilateral breasts and nipples: Secondary | ICD-10-CM

## 2015-11-25 DIAGNOSIS — Z87891 Personal history of nicotine dependence: Secondary | ICD-10-CM

## 2015-11-25 DIAGNOSIS — I10 Essential (primary) hypertension: Secondary | ICD-10-CM

## 2015-11-25 DIAGNOSIS — K5730 Diverticulosis of large intestine without perforation or abscess without bleeding: Secondary | ICD-10-CM

## 2015-11-25 DIAGNOSIS — R110 Nausea: Secondary | ICD-10-CM

## 2015-11-25 DIAGNOSIS — Z79811 Long term (current) use of aromatase inhibitors: Secondary | ICD-10-CM | POA: Diagnosis not present

## 2015-11-25 DIAGNOSIS — C50812 Malignant neoplasm of overlapping sites of left female breast: Secondary | ICD-10-CM | POA: Diagnosis not present

## 2015-11-25 DIAGNOSIS — Z9221 Personal history of antineoplastic chemotherapy: Secondary | ICD-10-CM

## 2015-11-25 DIAGNOSIS — Z7982 Long term (current) use of aspirin: Secondary | ICD-10-CM

## 2015-11-25 DIAGNOSIS — C773 Secondary and unspecified malignant neoplasm of axilla and upper limb lymph nodes: Secondary | ICD-10-CM | POA: Diagnosis not present

## 2015-11-25 DIAGNOSIS — Z79899 Other long term (current) drug therapy: Secondary | ICD-10-CM

## 2015-11-25 DIAGNOSIS — I252 Old myocardial infarction: Secondary | ICD-10-CM

## 2015-11-25 NOTE — Progress Notes (Signed)
Patient is here for follow up  

## 2015-11-25 NOTE — Assessment & Plan Note (Addendum)
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 Clinically no evidence of progression. CBC CMP within normal limits. Nov 1st CT chest/A/P-NED  # nausea/hair loss- sec to [place]; on prn anti-emetics.   # Right middle lobe lung nodule 4 mm-CT Nov 2017- resolved.  # Acute MI- s/p stenting [[facility]]- on anti-platelet therapy  # Follow-up in 3 months/CBC CMP CA 27- 29/scan prior to that visit.

## 2015-11-25 NOTE — Progress Notes (Signed)
[name] OFFICE PROGRESS NOTE  Patient Care Team: No Pcp Per Patient as PCP - General (General Practice) [name], MD (Unknown Physician Specialty) Seeplaputhur Robinette Haines, MD (General Surgery)  Breast cancer [facility])   Staging form: Breast, AJCC 7th Edition   - Clinical: Stage IV (T3, N3, M1) - Signed by [name], MD on 06/14/2014   Oncology History   1. 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  Biopsy from the lymph node in the left axilla positive for metastatic memory carcinoma Estrogen receptor positive Progesterone receptor +ve hER-2/neu receptors equivocal  by [facility]  IHC for HER-2/neu is 2+ Clinically staged asT4 D. N1 M0 tumor stage IV  locally advanced carcinoma.. 2. She was started on   [employer] OF 2015. 3. Treatment was changed to Cytoxan and Adriamycinffrom August 31, 2013  4.patient has finished oral 3 cycles of chemotherapy with Cytoxan and Adriamycin on October 19, 2013. 5.Patient had a bilateral mastectomy in November of 2015. ypT4B ypN3 ypM1 STAGE iv DISEASE.. repeat HER-2/neu receptor by [facility] is still equivocal (November, 2015).  6.  Started on Nov 2015-  letrozole and IBRANCE ; PET may 2017- NED   # RML 4 mm nodule-      Breast cancer ([place])   11/13/2013 Initial Diagnosis    Breast cancer       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 female, estrogen receptor positive ([place])    This is my first interaction with the patient as patient's primary oncologist has been Dr.Choksi. I reviewed the patient's prior charts/pertinent labs/imaging in detail; findings are summarized above.    INTERVAL HISTORY:  Faith [name] 70 y.o.  female pleasant patient above history of Stage IV breast cancer/ NED currently on Femara plus ibrance since November 2015 is here for follow-up.  Patient denies any unusual aches and pains. No  shortness of breath or chest pain. Denies any headaches. No nausea no vomiting. No diarrhea. No fevers.  REVIEW OF SYSTEMS:  A complete 10 point review of system is done which is negative except mentioned above/history of present illness.   PAST MEDICAL HISTORY :  Past Medical History:  Diagnosis Date  . Cancer [facility]) April 2015   left breast  . Hypertension     PAST SURGICAL HISTORY :   Past Surgical History:  Procedure Laterality Date  . BREAST SURGERY Bilateral 11/19/13   bilater mastectomy   . PORTACATH PLACEMENT  06-07-13  . SHOULDER OPEN ROTATOR CUFF REPAIR  2012  . WRIST SURGERY Left 1969   cyst    FAMILY HISTORY :   Family History  Problem Relation Age of Onset  . Adopted: Yes    SOCIAL HISTORY:   Social History  Substance Use Topics  . Smoking status: Former Smoker    Packs/day: 1.00    Years: 15.00    Types: Cigarettes    Quit date: 06/12/1985  . Smokeless tobacco: Never Used  . Alcohol use 0.0 oz/week    ALLERGIES:  is allergic to mushroom extract complex and shellfish allergy.  MEDICATIONS:  Current Outpatient Prescriptions  Medication Sig Dispense Refill  . acetaminophen (TYLENOL) 500 MG tablet Take 500 mg by mouth every 6 (six) hours as needed.    Marland Kitchen aspirin EC 81 MG tablet Take 81 mg by mouth daily.     Marland Kitchen atorvastatin (LIPITOR) 80 MG tablet Take  80 mg by mouth daily.     . Calcium Carbonate-Vitamin D (CALCIUM + D PO) Take 1 tablet by mouth daily.    . Cholecalciferol (VITAMIN D PO) Take 5,000 Units by mouth daily.    Faith [name] 125 MG capsule Take 1 capsule (125 mg total) by mouth daily with breakfast. Take for 3 weeks, then 1 week off. 21 capsule 3  . letrozole (FEMARA) 2.5 MG tablet Take 1 tablet (2.5 mg total) by mouth daily. 90 tablet 3  . lisinopril (PRINIVIL,ZESTRIL) 5 MG tablet Take 5 mg by mouth daily.     . metoprolol tartrate (LOPRESSOR) 25 MG tablet Take 25 mg by mouth 2 (two) times daily.     . nitroGLYCERIN (NITROSTAT) 0.4 MG SL tablet  Place under the tongue.    . ondansetron (ZOFRAN-ODT) 8 MG disintegrating tablet Take by mouth every 8 (eight) hours as needed.     . ticagrelor (BRILINTA) 90 MG TABS tablet Take 90 mg by mouth 2 (two) times daily.      No current facility-administered medications for this visit.    Facility-Administered Medications Ordered in Other Visits  Medication Dose Route Frequency Provider Last Rate Last Dose  . sodium chloride 0.9 % injection 10 mL  10 mL Intravenous PRN [name], MD   10 mL at 09/12/14 1014    PHYSICAL EXAMINATION: ECOG PERFORMANCE STATUS: 0 - Asymptomatic  BP 127/77 (BP Location: Right Arm, Patient Position: Sitting)   Pulse 75   Temp 98.7 F (37.1 C) (Tympanic)   Resp 18   Wt 230 lb 2.6 oz (104.4 kg)   BMI 36.05 kg/m   Filed Weights   11/25/15 0906  Weight: 230 lb 2.6 oz (104.4 kg)    GENERAL: Well-nourished well-developed; Alert, no distress and comfortable.  Alone. EYES: no pallor or icterus OROPHARYNX: no thrush or ulceration; good dentition  NECK: supple, no masses felt LYMPH:  no palpable lymphadenopathy in the cervical, axillary or inguinal regions LUNGS: clear to auscultation and  No wheeze or crackles HEART/CVS: regular rate & rhythm and no murmurs; No lower extremity edema ABDOMEN:abdomen soft, non-tender and normal bowel sounds Musculoskeletal:no cyanosis of digits and no clubbing  PSYCH: alert & oriented x 3 with fluent speech NEURO: no focal motor/sensory deficits SKIN:  no rashes or significant lesions  LABORATORY DATA:  I have reviewed the data as listed    Component Value Date/Time   NA 135 11/19/2015 0826   NA 137 05/16/2014 1154   K 4.4 11/19/2015 0826   K 3.9 05/16/2014 1154   CL 100 (L) 11/19/2015 0826   CL 104 05/16/2014 1154   CO2 25 11/19/2015 0826   CO2 25 05/16/2014 1154   GLUCOSE 115 (H) 11/19/2015 0826   GLUCOSE 110 (H) 05/16/2014 1154   BUN 19 11/19/2015 0826   BUN 20 05/16/2014 1154   CREATININE 1.09 (H) 11/19/2015  0826   CREATININE 1.00 05/16/2014 1154   CALCIUM 9.5 11/19/2015 0826   CALCIUM 9.6 05/16/2014 1154   PROT 7.7 11/19/2015 0826   PROT 7.4 05/16/2014 1154   ALBUMIN 4.1 11/19/2015 0826   ALBUMIN 4.0 05/16/2014 1154   AST 20 11/19/2015 0826   AST 26 05/16/2014 1154   ALT 22 11/19/2015 0826   ALT 37 05/16/2014 1154   ALKPHOS 94 11/19/2015 0826   ALKPHOS 72 05/16/2014 1154   BILITOT 0.6 11/19/2015 0826   BILITOT 0.5 05/16/2014 1154   GFRNONAA 49 (L) 11/19/2015 0826   GFRNONAA 57 (L) 05/16/2014  [place] (L) 11/19/2015 0826   GFRAA >60 05/16/2014 1154    No results found for: SPEP, UPEP  Lab Results  Component Value Date   WBC 4.6 11/19/2015   NEUTROABS 2.7 11/19/2015   HGB 12.5 11/19/2015   HCT 36.4 11/19/2015   MCV 100.1 (H) 11/19/2015   PLT 252 11/19/2015      Chemistry      Component Value Date/Time   NA 135 11/19/2015 0826   NA 137 05/16/2014 1154   K 4.4 11/19/2015 0826   K 3.9 05/16/2014 1154   CL 100 (L) 11/19/2015 0826   CL 104 05/16/2014 1154   CO2 25 11/19/2015 0826   CO2 25 05/16/2014 1154   BUN 19 11/19/2015 0826   BUN 20 05/16/2014 1154   CREATININE 1.09 (H) 11/19/2015 0826   CREATININE 1.00 05/16/2014 1154      Component Value Date/Time   CALCIUM 9.5 11/19/2015 0826   CALCIUM 9.6 05/16/2014 1154   ALKPHOS 94 11/19/2015 0826   ALKPHOS 72 05/16/2014 1154   AST 20 11/19/2015 0826   AST 26 05/16/2014 1154   ALT 22 11/19/2015 0826   ALT 37 05/16/2014 1154   BILITOT 0.6 11/19/2015 0826   BILITOT 0.5 05/16/2014 1154     IMPRESSION: Chest Impression:  No evidence of thoracic metastasis.  Abdomen / Pelvis Impression:  1. No evidence of abdominal or pelvic metastasis. 2. No skeletal metastasis. 3. Colonic diverticulosis.   Electronically Signed   By: Suzy Bouchard M.D.   On: 11/19/2015 11:40  RADIOGRAPHIC STUDIES: I have personally reviewed the radiological images as listed and agreed with the findings in the report. No  results found.   ASSESSMENT & PLAN:  No problem-specific Assessment & Plan notes found for this encounter.   No orders of the defined types were placed in this encounter.  All questions were answered. The patient knows to call the clinic with any problems, questions or concerns.      Cammie Sickle, MD 11/25/2015 9:18 AM

## 2015-11-25 NOTE — Progress Notes (Signed)
[facility] OFFICE PROGRESS NOTE  Patient Care Team: No Pcp Per Patient as PCP - General (General Practice) [name], MD (Unknown Physician Specialty) Seeplaputhur Robinette Haines, MD (General Surgery)  Breast cancer [facility])   Staging form: Breast, AJCC 7th Edition   - Clinical: Stage IV (T3, N3, M1) - Signed by [name], MD on 06/14/2014   Oncology History   1. 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  Biopsy from the lymph node in the left axilla positive for metastatic memory carcinoma Estrogen receptor positive Progesterone receptor +ve hER-2/neu receptors equivocal  by Fish  IHC for HER-2/neu is 2+ Clinically staged asT4 D. N1 M0 tumor stage IV  locally advanced carcinoma.. 2. She was started on   [place] OF 2015. 3. Treatment was changed to Cytoxan and Adriamycinffrom August 31, 2013  4.patient has finished oral 3 cycles of chemotherapy with Cytoxan and Adriamycin on October 19, 2013. 5.Patient had a bilateral mastectomy in November of 2015. ypT4B ypN3 ypM1 STAGE iv DISEASE.. repeat HER-2/neu receptor by [facility] is still equivocal (November, 2015).  6.  Started on Nov 2015-  letrozole and IBRANCE ; PET may 2017- NED; NOV 1st 2017- NED   # # Acute MI- s/p stenting [[facility]]  # RML 4 mm nodule- 1st NOV 2017- resolved     Breast cancer ([place])   11/13/2013 Initial Diagnosis    Breast cancer       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 female, estrogen receptor positive ([place])     INTERVAL HISTORY:  Faith [name] 70 y.o.  female pleasant patient above history of Stage IV breast cancer/ NED currently on Femara plus ibrance since November 2015 is here for follow-up.  In the interim patient was diagnosed with acute MI- status post stenting at [facility]. She is also is on antiplatelet therapy and statins. Lisinopril making her  cough.  Patient denies any unusual aches and pains. No shortness of breath or chest pain. Denies any headaches. No nausea no vomiting. No diarrhea. No fevers.  REVIEW OF SYSTEMS:  A complete 10 point review of system is done which is negative except mentioned above/history of present illness.   PAST MEDICAL HISTORY :  Past Medical History:  Diagnosis Date  . Cancer [facility]) April 2015   left breast  . Hypertension     PAST SURGICAL HISTORY :   Past Surgical History:  Procedure Laterality Date  . BREAST SURGERY Bilateral 11/19/13   bilater mastectomy   . PORTACATH PLACEMENT  06-07-13  . SHOULDER OPEN ROTATOR CUFF REPAIR  2012  . WRIST SURGERY Left 1969   cyst    FAMILY HISTORY :   Family History  Problem Relation Age of Onset  . Adopted: Yes    SOCIAL HISTORY:   Social History  Substance Use Topics  . Smoking status: Former Smoker    Packs/day: 1.00    Years: 15.00    Types: Cigarettes    Quit date: 06/12/1985  . Smokeless tobacco: Never Used  . Alcohol use 0.0 oz/week    ALLERGIES:  is allergic to mushroom extract complex and shellfish allergy.  MEDICATIONS:  Current Outpatient Prescriptions  Medication Sig Dispense Refill  . acetaminophen (TYLENOL) 500 MG tablet Take 500 mg by mouth every 6 (six) hours as needed.    Marland Kitchen aspirin EC 81 MG tablet Take 81  mg by mouth daily.     Marland Kitchen atorvastatin (LIPITOR) 80 MG tablet Take 80 mg by mouth daily.     . Calcium Carbonate-Vitamin D (CALCIUM + D PO) Take 1 tablet by mouth daily.    . Cholecalciferol (VITAMIN D PO) Take 5,000 Units by mouth daily.    [name] 125 MG capsule Take 1 capsule (125 mg total) by mouth daily with breakfast. Take for 3 weeks, then 1 week off. 21 capsule 3  . letrozole (FEMARA) 2.5 MG tablet Take 1 tablet (2.5 mg total) by mouth daily. 90 tablet 3  . lisinopril (PRINIVIL,ZESTRIL) 5 MG tablet Take 5 mg by mouth daily.     . metoprolol tartrate (LOPRESSOR) 25 MG tablet Take 25 mg by mouth 2 (two) times  daily.     . nitroGLYCERIN (NITROSTAT) 0.4 MG SL tablet Place under the tongue.    . ondansetron (ZOFRAN-ODT) 8 MG disintegrating tablet Take by mouth every 8 (eight) hours as needed.     . ticagrelor (BRILINTA) 90 MG TABS tablet Take 90 mg by mouth 2 (two) times daily.      No current facility-administered medications for this visit.    Facility-Administered Medications Ordered in Other Visits  Medication Dose Route Frequency Provider Last Rate Last Dose  . sodium chloride 0.9 % injection 10 mL  10 mL Intravenous PRN [name], MD   10 mL at 09/12/14 1014    PHYSICAL EXAMINATION: ECOG PERFORMANCE STATUS: 0 - Asymptomatic  BP 127/77 (BP Location: Right Arm, Patient Position: Sitting)   Pulse 75   Temp 98.7 F (37.1 C) (Tympanic)   Resp 18   Wt 230 lb 2.6 oz (104.4 kg)   BMI 36.05 kg/m   Filed Weights   11/25/15 0906  Weight: 230 lb 2.6 oz (104.4 kg)    GENERAL: Well-nourished well-developed; Alert, no distress and comfortable.  Alone. EYES: no pallor or icterus OROPHARYNX: no thrush or ulceration; good dentition  NECK: supple, no masses felt LYMPH:  no palpable lymphadenopathy in the cervical, axillary or inguinal regions LUNGS: clear to auscultation and  No wheeze or crackles HEART/CVS: regular rate & rhythm and no murmurs; No lower extremity edema ABDOMEN:abdomen soft, non-tender and normal bowel sounds Musculoskeletal:no cyanosis of digits and no clubbing  PSYCH: alert & oriented x 3 with fluent speech NEURO: no focal motor/sensory deficits SKIN:  no rashes or significant lesions  LABORATORY DATA:  I have reviewed the data as listed    Component Value Date/Time   NA 135 11/19/2015 0826   NA 137 05/16/2014 1154   K 4.4 11/19/2015 0826   K 3.9 05/16/2014 1154   CL 100 (L) 11/19/2015 0826   CL 104 05/16/2014 1154   CO2 25 11/19/2015 0826   CO2 25 05/16/2014 1154   GLUCOSE 115 (H) 11/19/2015 0826   GLUCOSE 110 (H) 05/16/2014 1154   BUN 19 11/19/2015 0826    BUN 20 05/16/2014 1154   CREATININE 1.09 (H) 11/19/2015 0826   CREATININE 1.00 05/16/2014 1154   CALCIUM 9.5 11/19/2015 0826   CALCIUM 9.6 05/16/2014 1154   PROT 7.7 11/19/2015 0826   PROT 7.4 05/16/2014 1154   ALBUMIN 4.1 11/19/2015 0826   ALBUMIN 4.0 05/16/2014 1154   AST 20 11/19/2015 0826   AST 26 05/16/2014 1154   ALT 22 11/19/2015 0826   ALT 37 05/16/2014 1154   ALKPHOS 94 11/19/2015 0826   ALKPHOS 72 05/16/2014 1154   BILITOT 0.6 11/19/2015 0826   BILITOT 0.5  05/16/2014 1154   GFRNONAA 49 (L) 11/19/2015 0826   GFRNONAA 57 (L) 05/16/2014 1154   GFRAA 57 (L) 11/19/2015 0826   GFRAA >60 05/16/2014 1154    No results found for: SPEP, UPEP  Lab Results  Component Value Date   WBC 4.6 11/19/2015   NEUTROABS 2.7 11/19/2015   HGB 12.5 11/19/2015   HCT 36.4 11/19/2015   MCV 100.1 (H) 11/19/2015   PLT 252 11/19/2015      Chemistry      Component Value Date/Time   NA 135 11/19/2015 0826   NA 137 05/16/2014 1154   K 4.4 11/19/2015 0826   K 3.9 05/16/2014 1154   CL 100 (L) 11/19/2015 0826   CL 104 05/16/2014 1154   CO2 25 11/19/2015 0826   CO2 25 05/16/2014 1154   BUN 19 11/19/2015 0826   BUN 20 05/16/2014 1154   CREATININE 1.09 (H) 11/19/2015 0826   CREATININE 1.00 05/16/2014 1154      Component Value Date/Time   CALCIUM 9.5 11/19/2015 0826   CALCIUM 9.6 05/16/2014 1154   ALKPHOS 94 11/19/2015 0826   ALKPHOS 72 05/16/2014 1154   AST 20 11/19/2015 0826   AST 26 05/16/2014 1154   ALT 22 11/19/2015 0826   ALT 37 05/16/2014 1154   BILITOT 0.6 11/19/2015 0826   BILITOT 0.5 05/16/2014 1154     IMPRESSION: Chest Impression:  No evidence of thoracic metastasis.  Abdomen / Pelvis Impression:  1. No evidence of abdominal or pelvic metastasis. 2. No skeletal metastasis. 3. Colonic diverticulosis.   Electronically Signed   By: Suzy Bouchard M.D.   On: 11/19/2015 11:40  RADIOGRAPHIC STUDIES: I have personally reviewed the radiological images as  listed and agreed with the findings in the report. No results found.   ASSESSMENT & PLAN:  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 female, estrogen receptor positive ([place]) Breast cancer stage IV NED- currently on letrozole and ibrance [since 2015]. Clinically no evidence of progression. CBC CMP within normal limits. Nov 1st CT chest/A/P-NED  # nausea/hair loss- sec to [place]; on prn anti-emetics.   # Right middle lobe lung nodule 4 mm-CT Nov 2017- resolved.  # Acute MI- s/p stenting [[facility]]- on anti-platelet therapy  # Follow-up in 3 months/CBC CMP CA 27- 29/scan prior to that visit.  No orders of the defined types were placed in this encounter.  All questions were answered. The patient knows to call the clinic with any problems, questions or concerns.   # I reviewed the blood work- with the patient in detail; also reviewed the imaging independently [as summarized above]; and with the patient in detail.   # 25 minutes face-to-face with the patient discussing the above plan of care; more than 50% of time spent on prognosis/ natural history; counseling and coordination.     Cammie Sickle, MD 11/25/2015 9:41 AM

## 2015-12-18 ENCOUNTER — Telehealth: Payer: Self-pay | Admitting: *Deleted

## 2015-12-18 NOTE — Telephone Encounter (Signed)
Called to say we need to send a prescription to the manufacturer for her Faith [name]

## 2015-12-19 ENCOUNTER — Telehealth: Payer: Self-pay | Admitting: Oncology

## 2015-12-19 ENCOUNTER — Other Ambulatory Visit: Payer: Self-pay | Admitting: *Deleted

## 2015-12-19 DIAGNOSIS — Z170 Estrogen receptor positive status [ER+]: Principal | ICD-10-CM

## 2015-12-19 DIAGNOSIS — C50812 Malignant neoplasm of overlapping sites of left female breast: Secondary | ICD-10-CM

## 2015-12-19 MED ORDER — IBRANCE 125 MG PO CAPS: 125.0000 mg | ORAL_CAPSULE | Freq: Every day | ORAL | 4 refills | Status: DC

## 2015-12-19 NOTE — Telephone Encounter (Signed)
Spoke with patient and biologics today.  Pt's funding has run out with the patient adv. Foundation with her ibrance. However, pt will now need to apply with [employer] application. I have obtained an application with [employer] for ibrance assistance. Dr. Rogue Bussing signed his portion of the form. I contacted pt and asked her to come on Monday at 830 am to sign her portion of the paperwork and bring proof of income.

## 2015-12-19 NOTE — Telephone Encounter (Signed)
Please forward to Dr. Rogue Bussing.

## 2015-12-19 NOTE — Telephone Encounter (Signed)
Patient would like to get Ibrance from manufacturer. Please call her to discuss at [phone]. Thank you!

## 2015-12-22 ENCOUNTER — Other Ambulatory Visit: Payer: Self-pay | Admitting: *Deleted

## 2015-12-22 DIAGNOSIS — Z170 Estrogen receptor positive status [ER+]: Principal | ICD-10-CM

## 2015-12-22 DIAGNOSIS — C50812 Malignant neoplasm of overlapping sites of left female breast: Secondary | ICD-10-CM

## 2015-12-22 MED ORDER — IBRANCE 125 MG PO CAPS: 125.0000 mg | ORAL_CAPSULE | Freq: Every day | ORAL | 4 refills | Status: DC

## 2015-12-22 NOTE — Telephone Encounter (Signed)
Note has been forwarded to Dr. Jacinto Reap and Renita Papa, RN.

## 2015-12-22 NOTE — Telephone Encounter (Signed)
Pt brought proof of income today and completed the necessary paperwork for income. Application faxed to [employer]. Pt states she only has less than a week supply left of drug at this time.

## 2015-12-24 ENCOUNTER — Telehealth: Payer: Self-pay | Admitting: *Deleted

## 2015-12-24 NOTE — Telephone Encounter (Signed)
Contacted [facility] [facility] for prior auth for patient's ibrance 125 mg 1 capsule daily with breakfast x 21 days and 1 week off. Does not require a prior auth at this time (prior auth- ref number for call [id number]. This number is only good until 01/18/16.  A new prior Josem Kaufmann will be required 01/19/16.   1051 am [employer] contacted back to provide this prior [place] information. Spoke with Tameka.

## 2015-12-24 NOTE — Telephone Encounter (Signed)
Faith [name] at [facility] for [place] together-  Left vm to have call Faith Kressin, Rn back at  [phone] to return her call to discuss patient's pfizer application.    Returned phone call to [employer]. Spoke with [name]. Needs prior auth from [employer] rx for drug and call back with approval #.  Prior auth phone number to call:  [phone]

## 2015-12-26 ENCOUNTER — Telehealth: Payer: Self-pay | Admitting: *Deleted

## 2015-12-26 NOTE — Telephone Encounter (Signed)
vicky with [employer] - called to update DR. Brahmanday on pt application financial assistance for ibrance - re: pt: Faith [name]  Dob: 05-Oct-1945  pt now enrolled effective 12/25/15-01/18/2016.  will contact patient to inform her that she was approved. She will confirm that pt's insurance will not change for 2018.  If it has not, then the patient's financial assistance case will be approved for 2018.

## 2016-01-01 ENCOUNTER — Other Ambulatory Visit: Payer: Self-pay | Admitting: Oncology

## 2016-01-01 DIAGNOSIS — C50912 Malignant neoplasm of unspecified site of left female breast: Secondary | ICD-10-CM

## 2016-02-04 ENCOUNTER — Telehealth: Payer: Self-pay | Admitting: *Deleted

## 2016-02-04 NOTE — Telephone Encounter (Signed)
Be[place] from [employer] called regarding the patient's financial drug assistance.  Pt is eligible for funds $5400 grant through [employer].  Pt will receive the information through mail to apply for the [employer] foundation. At this time, no further documentation is necessary from our office.

## 2016-02-23 ENCOUNTER — Encounter: Payer: Self-pay | Admitting: General Surgery

## 2016-02-23 ENCOUNTER — Ambulatory Visit (INDEPENDENT_AMBULATORY_CARE_PROVIDER_SITE_OTHER): Payer: Medicare Other | Admitting: General Surgery

## 2016-02-23 VITALS — BP 130/80 | Ht 67.0 in | Wt 232.0 lb

## 2016-02-23 DIAGNOSIS — I890 Lymphedema, not elsewhere classified: Secondary | ICD-10-CM

## 2016-02-23 DIAGNOSIS — C50912 Malignant neoplasm of unspecified site of left female breast: Secondary | ICD-10-CM

## 2016-02-23 NOTE — Progress Notes (Signed)
Patient ID: Faith [name], female   DOB: [birth year], 71 y.o.   MRN: [id number]  Chief Complaint  Patient presents with  . Follow-up    lymphadema    HPI Faith [name] is a 71 y.o. female here for a follow up for stage IV breast CA follow-up. Pt had a bilateral mastectomy in 2015, and has lymphedema left armt. She recently had a heart attack in [place] recovered well with residual cardiac issues. I have reviewed the history of present illness with the patient.   HPI  Past Medical History:  Diagnosis Date  . Cancer [facility]) April 2015   left breast  . Hypertension   . Myocardial infarction 11/03/2015   [facility], stent placed    Past Surgical History:  Procedure Laterality Date  . BREAST SURGERY Bilateral 11/19/13   bilater mastectomy   . PORTACATH PLACEMENT  06-07-13  . SHOULDER OPEN ROTATOR CUFF REPAIR  2012  . WRIST SURGERY Left 1969   cyst    Family History  Problem Relation Age of Onset  . Adopted: Yes    Social History Social History  Substance Use Topics  . Smoking status: Former Smoker    Packs/day: 1.00    Years: 15.00    Types: Cigarettes    Quit date: 06/12/1985  . Smokeless tobacco: Never Used  . Alcohol use 0.0 oz/week    Allergies  Allergen Reactions  . Mushroom Extract Complex Hives and Swelling  . Shellfish Allergy Nausea And Vomiting    Current Outpatient Prescriptions  Medication Sig Dispense Refill  . acetaminophen (TYLENOL) 500 MG tablet Take 500 mg by mouth every 6 (six) hours as needed.    Marland Kitchen aspirin EC 81 MG tablet Take 81 mg by mouth daily.     Marland Kitchen atorvastatin (LIPITOR) 80 MG tablet Take 80 mg by mouth daily.     . Calcium Carbonate-Vitamin D (CALCIUM + D PO) Take 1 tablet by mouth daily.    . Cholecalciferol (VITAMIN D PO) Take 5,000 Units by mouth daily.    . clopidogrel (PLAVIX) 75 MG tablet Take by mouth.    [name] 125 MG capsule Take 1 capsule (125 mg total) by mouth daily with breakfast. Take for 3 weeks, then 1 week off. 21 capsule  4  . letrozole (FEMARA) 2.5 MG tablet TAKE 1 TABLET BY MOUTH  DAILY 90 tablet 1  . lisinopril (PRINIVIL,ZESTRIL) 5 MG tablet Take 5 mg by mouth daily.     Marland Kitchen losartan (COZAAR) 25 MG tablet Take 25 mg by mouth daily.    . metoprolol tartrate (LOPRESSOR) 25 MG tablet Take 25 mg by mouth 2 (two) times daily.     . nitroGLYCERIN (NITROSTAT) 0.4 MG SL tablet Place under the tongue.    . ondansetron (ZOFRAN-ODT) 8 MG disintegrating tablet Take by mouth every 8 (eight) hours as needed.      No current facility-administered medications for this visit.    Facility-Administered Medications Ordered in Other Visits  Medication Dose Route Frequency Provider Last Rate Last Dose  . sodium chloride 0.9 % injection 10 mL  10 mL Intravenous PRN [name], MD   10 mL at 09/12/14 1014    Review of Systems Review of Systems  Constitutional: Negative.   Respiratory: Negative.   Cardiovascular: Negative.     Blood pressure 130/80, height 5\' 7"  (1.702 m), weight 232 lb (105.2 kg).  Physical Exam Physical Exam  Constitutional: She is oriented to person, place, and time. She appears well-developed  and well-nourished.  Eyes: Conjunctivae are normal. No scleral icterus.  Neck: Neck supple.  Cardiovascular: Normal rate, regular rhythm and normal heart sounds.   Pulmonary/Chest: Effort normal and breath sounds normal.  Both mastectomy sites are well-healed. No sign of local recurrence  Lymphadenopathy:    She has no cervical adenopathy.    She has no axillary adenopathy.  Neurological: She is alert and oriented to person, place, and time.  Skin: Skin is warm and dry.  Psychiatric: She has a normal mood and affect.    Data Reviewed Prior notes CT from Nov 2017- no metastatic disease noted. CA 27-29 normal 3 mos ago Assessment    CA breast stage IV. ER/PR pos. Pt now on Letrazole and Ibrance- stable course. Recent STEMI- had PCI and is now on Plavix. No cardiac compromise since Left arm lymphedema  -appears stable and mild.     Plan   Encouraged use of lymphedema pump daily Continue follow up with oncology. Follow up here in 6 mos    This has been scribed by Lesly Rubenstein LPN    Junie Panning G 02/23/2016, 3:16 PM

## 2016-02-24 ENCOUNTER — Inpatient Hospital Stay (HOSPITAL_BASED_OUTPATIENT_CLINIC_OR_DEPARTMENT_OTHER): Payer: Medicare Other | Admitting: Internal Medicine

## 2016-02-24 ENCOUNTER — Inpatient Hospital Stay: Payer: Medicare Other | Attending: Internal Medicine

## 2016-02-24 ENCOUNTER — Telehealth: Payer: Self-pay | Admitting: *Deleted

## 2016-02-24 VITALS — BP 130/81 | HR 80 | Temp 98.0°F | Wt 231.6 lb

## 2016-02-24 DIAGNOSIS — Z79811 Long term (current) use of aromatase inhibitors: Secondary | ICD-10-CM | POA: Diagnosis not present

## 2016-02-24 DIAGNOSIS — Z170 Estrogen receptor positive status [ER+]: Secondary | ICD-10-CM

## 2016-02-24 DIAGNOSIS — Z7982 Long term (current) use of aspirin: Secondary | ICD-10-CM | POA: Insufficient documentation

## 2016-02-24 DIAGNOSIS — R918 Other nonspecific abnormal finding of lung field: Secondary | ICD-10-CM | POA: Diagnosis not present

## 2016-02-24 DIAGNOSIS — I252 Old myocardial infarction: Secondary | ICD-10-CM

## 2016-02-24 DIAGNOSIS — Z79899 Other long term (current) drug therapy: Secondary | ICD-10-CM

## 2016-02-24 DIAGNOSIS — C50812 Malignant neoplasm of overlapping sites of left female breast: Secondary | ICD-10-CM | POA: Diagnosis not present

## 2016-02-24 DIAGNOSIS — Z87891 Personal history of nicotine dependence: Secondary | ICD-10-CM | POA: Diagnosis not present

## 2016-02-24 DIAGNOSIS — Z9013 Acquired absence of bilateral breasts and nipples: Secondary | ICD-10-CM | POA: Diagnosis not present

## 2016-02-24 DIAGNOSIS — I10 Essential (primary) hypertension: Secondary | ICD-10-CM | POA: Diagnosis not present

## 2016-02-24 DIAGNOSIS — Z9221 Personal history of antineoplastic chemotherapy: Secondary | ICD-10-CM

## 2016-02-24 LAB — COMPREHENSIVE METABOLIC PANEL
ALK PHOS: 103 U/L (ref 38–126)
ALT: 21 U/L (ref 14–54)
AST: 23 U/L (ref 15–41)
Albumin: 4 g/dL (ref 3.5–5.0)
Anion gap: 7 (ref 5–15)
BILIRUBIN TOTAL: 0.7 mg/dL (ref 0.3–1.2)
BUN: 17 mg/dL (ref 6–20)
CO2: 29 mmol/L (ref 22–32)
Calcium: 9.7 mg/dL (ref 8.9–10.3)
Chloride: 99 mmol/L — ABNORMAL LOW (ref 101–111)
Creatinine, Ser: 1.22 mg/dL — ABNORMAL HIGH (ref 0.44–1.00)
GFR, EST AFRICAN AMERICAN: 50 mL/min — AB (ref >60)
GFR, EST NON AFRICAN AMERICAN: 43 mL/min — AB (ref >60)
Glucose, Bld: 118 mg/dL — ABNORMAL HIGH (ref 65–99)
Potassium: 4.5 mmol/L (ref 3.5–5.1)
SODIUM: 135 mmol/L (ref 135–145)
Total Protein: 7.8 g/dL (ref 6.5–8.1)

## 2016-02-24 LAB — CBC WITH DIFFERENTIAL/PLATELET
Basophils Absolute: 0.1 10*3/uL (ref 0–0.1)
Basophils Relative: 1 %
Eosinophils Absolute: 0.1 10*3/uL (ref 0–0.7)
Eosinophils Relative: 3 %
HCT: 38.3 % (ref 35.0–47.0)
HEMOGLOBIN: 13.1 g/dL (ref 12.0–16.0)
LYMPHS ABS: 1 10*3/uL (ref 1.0–3.6)
Lymphocytes Relative: 24 %
MCH: 33.7 pg (ref 26.0–34.0)
MCHC: 34.1 g/dL (ref 32.0–36.0)
MCV: 98.8 fL (ref 80.0–100.0)
MONOS PCT: 4 %
Monocytes Absolute: 0.2 10*3/uL (ref 0.2–0.9)
NEUTROS ABS: 2.7 10*3/uL (ref 1.4–6.5)
NEUTROS PCT: 68 %
Platelets: 321 10*3/uL (ref 150–440)
RBC: 3.88 MIL/uL (ref 3.80–5.20)
RDW: 15.1 % — ABNORMAL HIGH (ref 11.5–14.5)
WBC: 4.1 10*3/uL (ref 3.6–11.0)

## 2016-02-24 NOTE — Progress Notes (Signed)
Patient here today for follow up.   

## 2016-02-24 NOTE — Telephone Encounter (Signed)
Patient requested for me to contact Patient [facility], [phone], about assistance for Ibrance.  Call today and was told that there was nothing else needed, they are just waiting for her assistance paperwork to be reviewed.  Advised them that patient only has one tablet left and requested for review to be expedited, I was told that they would note that and would do the best they could.

## 2016-02-24 NOTE — Assessment & Plan Note (Addendum)
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  Nov 1st CT chest/A/P-NED. Tolerating treatments well. Clinically no evidence of progression.  # Patient has concerns regarding ongoing prescription for ibrance.   # Acute MI- s/p stenting [[facility]]- on anti-platelet therapy; no further events.   # Follow-up in 3 months/CBC CMP CA 27- 29/ CT scans prior-few days.

## 2016-02-24 NOTE — Progress Notes (Signed)
[name] OFFICE PROGRESS NOTE  Patient Care Team: No Pcp Per Patient as PCP - General (General Practice) [name], MD (Unknown Physician Specialty) Seeplaputhur Robinette Haines, MD (General Surgery)  Cancer Staging Breast cancer [facility]) Staging form: Breast, AJCC 7th Edition - Clinical: Stage IV (T3, N3, M1) - Signed by [name], MD on 06/14/2014    Oncology History   1. 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  Biopsy from the lymph node in the left axilla positive for metastatic memory carcinoma Estrogen receptor positive Progesterone receptor +ve hER-2/neu receptors equivocal  by [facility]  IHC for HER-2/neu is 2+ Clinically staged asT4 D. N1 M0 tumor stage IV  locally advanced carcinoma.. 2. She was started on   [place] OF 2015. 3. Treatment was changed to Cytoxan and Adriamycinffrom August 31, 2013  4.patient has finished oral 3 cycles of chemotherapy with Cytoxan and Adriamycin on October 19, 2013. 5.Patient had a bilateral mastectomy in November of 2015. ypT4B ypN3 ypM1 STAGE iv DISEASE.. repeat HER-2/neu receptor by [facility] is still equivocal (November, 2015).  6.  Started on Nov 2015-  letrozole and IBRANCE ; PET may 2017- NED; NOV 1st 2017- NED   # # Acute MI- s/p stenting [[facility]]  # RML 4 mm nodule- 1st NOV 2017- resolved     Breast cancer ([place])   11/13/2013 Initial Diagnosis    Breast cancer       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 female, estrogen receptor positive ([place])     INTERVAL HISTORY:  Faith [name] 71 y.o.  female pleasant patient above history of Stage IV breast cancer/ NED currently on Femara plus ibrance since November 2015 is here for follow-up.   Patient denies any unusual aches and pains. No shortness of breath or chest pain. Denies any headaches. No nausea no vomiting. No diarrhea. No fevers. Not losing  any weight.  REVIEW OF SYSTEMS:  A complete 10 point review of system is done which is negative except mentioned above/history of present illness.   PAST MEDICAL HISTORY :  Past Medical History:  Diagnosis Date  . Cancer [facility]) April 2015   left breast  . Hypertension   . Myocardial infarction 11/03/2015   [facility], stent placed    PAST SURGICAL HISTORY :   Past Surgical History:  Procedure Laterality Date  . BREAST SURGERY Bilateral 11/19/13   bilater mastectomy   . PORTACATH PLACEMENT  06-07-13  . SHOULDER OPEN ROTATOR CUFF REPAIR  2012  . WRIST SURGERY Left 1969   cyst    FAMILY HISTORY :   Family History  Problem Relation Age of Onset  . Adopted: Yes    SOCIAL HISTORY:   Social History  Substance Use Topics  . Smoking status: Former Smoker    Packs/day: 1.00    Years: 15.00    Types: Cigarettes    Quit date: 06/12/1985  . Smokeless tobacco: Never Used  . Alcohol use 0.0 oz/week    ALLERGIES:  is allergic to mushroom extract complex and shellfish allergy.  MEDICATIONS:  Current Outpatient Prescriptions  Medication Sig Dispense Refill  . acetaminophen (TYLENOL) 500 MG tablet Take 500 mg by mouth every 6 (six) hours as needed.    Marland Kitchen aspirin EC 81 MG tablet Take 81 mg by mouth daily.     Marland Kitchen atorvastatin (LIPITOR) 80 MG tablet Take 80  mg by mouth daily.     . Calcium Carbonate-Vitamin D (CALCIUM + D PO) Take 1 tablet by mouth daily.    . Cholecalciferol (VITAMIN D PO) Take 5,000 Units by mouth daily.    . clopidogrel (PLAVIX) 75 MG tablet Take 75 mg by mouth daily.     [name] 125 MG capsule Take 1 capsule (125 mg total) by mouth daily with breakfast. Take for 3 weeks, then 1 week off. 21 capsule 4  . letrozole (FEMARA) 2.5 MG tablet TAKE 1 TABLET BY MOUTH  DAILY 90 tablet 1  . losartan (COZAAR) 25 MG tablet Take 25 mg by mouth daily.    . metoprolol tartrate (LOPRESSOR) 25 MG tablet Take 25 mg by mouth 2 (two) times daily.     . nitroGLYCERIN (NITROSTAT) 0.4 MG SL  tablet Place under the tongue.    . ondansetron (ZOFRAN-ODT) 8 MG disintegrating tablet Take by mouth every 8 (eight) hours as needed.      No current facility-administered medications for this visit.    Facility-Administered Medications Ordered in Other Visits  Medication Dose Route Frequency Provider Last Rate Last Dose  . sodium chloride 0.9 % injection 10 mL  10 mL Intravenous PRN [name], MD   10 mL at 09/12/14 1014    PHYSICAL EXAMINATION: ECOG PERFORMANCE STATUS: 0 - Asymptomatic  BP 130/81 (BP Location: Right Arm, Patient Position: Sitting)   Pulse 80   Temp 98 F (36.7 C) (Tympanic)   Wt 231 lb 9.5 oz (105 kg)   BMI 36.27 kg/m   Filed Weights   02/24/16 0938  Weight: 231 lb 9.5 oz (105 kg)    GENERAL: Well-nourished well-developed; Alert, no distress and comfortable.  Alone. EYES: no pallor or icterus OROPHARYNX: no thrush or ulceration; good dentition  NECK: supple, no masses felt LYMPH:  no palpable lymphadenopathy in the cervical, axillary or inguinal regions LUNGS: clear to auscultation and  No wheeze or crackles HEART/CVS: regular rate & rhythm and no murmurs; No lower extremity edema ABDOMEN:abdomen soft, non-tender and normal bowel sounds Musculoskeletal:no cyanosis of digits and no clubbing  PSYCH: alert & oriented x 3 with fluent speech NEURO: no focal motor/sensory deficits SKIN:  no rashes or significant lesions  LABORATORY DATA:  I have reviewed the data as listed    Component Value Date/Time   NA 135 02/24/2016 0919   NA 137 05/16/2014 1154   K 4.5 02/24/2016 0919   K 3.9 05/16/2014 1154   CL 99 (L) 02/24/2016 0919   CL 104 05/16/2014 1154   CO2 29 02/24/2016 0919   CO2 25 05/16/2014 1154   GLUCOSE 118 (H) 02/24/2016 0919   GLUCOSE 110 (H) 05/16/2014 1154   BUN 17 02/24/2016 0919   BUN 20 05/16/2014 1154   CREATININE 1.22 (H) 02/24/2016 0919   CREATININE 1.00 05/16/2014 1154   CALCIUM 9.7 02/24/2016 0919   CALCIUM 9.6 05/16/2014  1154   PROT 7.8 02/24/2016 0919   PROT 7.4 05/16/2014 1154   ALBUMIN 4.0 02/24/2016 0919   ALBUMIN 4.0 05/16/2014 1154   AST 23 02/24/2016 0919   AST 26 05/16/2014 1154   ALT 21 02/24/2016 0919   ALT 37 05/16/2014 1154   ALKPHOS 103 02/24/2016 0919   ALKPHOS 72 05/16/2014 1154   BILITOT 0.7 02/24/2016 0919   BILITOT 0.5 05/16/2014 1154   GFRNONAA 43 (L) 02/24/2016 0919   GFRNONAA 57 (L) 05/16/2014 1154   GFRAA 50 (L) 02/24/2016 0919   GFRAA >60  05/16/2014 1154    No results found for: SPEP, UPEP  Lab Results  Component Value Date   WBC 4.1 02/24/2016   NEUTROABS 2.7 02/24/2016   HGB 13.1 02/24/2016   HCT 38.3 02/24/2016   MCV 98.8 02/24/2016   PLT 321 02/24/2016      Chemistry      Component Value Date/Time   NA 135 02/24/2016 0919   NA 137 05/16/2014 1154   K 4.5 02/24/2016 0919   K 3.9 05/16/2014 1154   CL 99 (L) 02/24/2016 0919   CL 104 05/16/2014 1154   CO2 29 02/24/2016 0919   CO2 25 05/16/2014 1154   BUN 17 02/24/2016 0919   BUN 20 05/16/2014 1154   CREATININE 1.22 (H) 02/24/2016 0919   CREATININE 1.00 05/16/2014 1154      Component Value Date/Time   CALCIUM 9.7 02/24/2016 0919   CALCIUM 9.6 05/16/2014 1154   ALKPHOS 103 02/24/2016 0919   ALKPHOS 72 05/16/2014 1154   AST 23 02/24/2016 0919   AST 26 05/16/2014 1154   ALT 21 02/24/2016 0919   ALT 37 05/16/2014 1154   BILITOT 0.7 02/24/2016 0919   BILITOT 0.5 05/16/2014 1154     IMPRESSION: Chest Impression:  No evidence of thoracic metastasis.  Abdomen / Pelvis Impression:  1. No evidence of abdominal or pelvic metastasis. 2. No skeletal metastasis. 3. Colonic diverticulosis.   Electronically Signed   By: Suzy Bouchard M.D.   On: 11/19/2015 11:40  RADIOGRAPHIC STUDIES: I have personally reviewed the radiological images as listed and agreed with the findings in the report. No results found.   ASSESSMENT & PLAN:  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 female,  estrogen receptor positive ([place]) Breast cancer stage IV NED- currently on letrozole and ibrance [since 2015].  Nov 1st CT chest/A/P-NED. Tolerating treatments well. Clinically no evidence of progression.  # Patient has concerns regarding ongoing prescription for ibrance.   # Acute MI- s/p stenting [[facility]]- on anti-platelet therapy; no further events.   # Follow-up in 3 months/CBC CMP CA 27- 29/ CT scans prior-few days.   Orders Placed This Encounter  Procedures  . CT CHEST W CONTRAST    Standing Status:   Future    Standing Expiration Date:   04/25/2017    Order Specific Question:   Reason for Exam (SYMPTOM  OR DIAGNOSIS REQUIRED)    Answer:   breast cancer    Order Specific Question:   Preferred imaging location?    Answer:   [facility]  . CT ABDOMEN PELVIS W CONTRAST    Standing Status:   Future    Standing Expiration Date:   05/25/2017    Order Specific Question:   Reason for Exam (SYMPTOM  OR DIAGNOSIS REQUIRED)    Answer:   breast cancer    Order Specific Question:   Preferred imaging location?    Answer:   [facility]  . CBC with Differential/Platelet    Standing Status:   Future    Standing Expiration Date:   08/23/2016  . Comprehensive metabolic panel    Standing Status:   Future    Standing Expiration Date:   08/23/2016  . Cancer antigen 27.29    Standing Status:   Future    Standing Expiration Date:   08/23/2016   All questions were answered. The patient knows to call the clinic with any problems, questions or concerns.     Cammie Sickle, MD 02/24/2016 10:03 AM

## 2016-02-25 LAB — CANCER ANTIGEN 27.29: CA 27.29: 15.3 U/mL (ref 0.0–38.6)

## 2016-03-29 ENCOUNTER — Telehealth: Payer: Self-pay | Admitting: *Deleted

## 2016-03-29 NOTE — Telephone Encounter (Signed)
Paperwork completed and faxed to [employer]

## 2016-03-29 NOTE — Telephone Encounter (Signed)
Called and advised patient that per Vickki Muff we never received paperwork from company and asked if she will contact them to send over forms needed. She agreed to call and I gave her the fax number to send it to.

## 2016-03-29 NOTE — Telephone Encounter (Signed)
Called to report that today  Is the last day for the [employer] to get the physician form returned and they have not received it as of this date. Please fax it and call her to let her know it is done   [phone]

## 2016-05-21 ENCOUNTER — Inpatient Hospital Stay: Payer: Medicare Other | Attending: Internal Medicine

## 2016-05-21 ENCOUNTER — Other Ambulatory Visit: Payer: Self-pay | Admitting: *Deleted

## 2016-05-21 ENCOUNTER — Ambulatory Visit
Admission: RE | Admit: 2016-05-21 | Discharge: 2016-05-21 | Disposition: A | Discharge status: Home / Self Care | Payer: Medicare Other | Source: Ambulatory Visit | Attending: Internal Medicine | Admitting: Internal Medicine

## 2016-05-21 DIAGNOSIS — C50812 Malignant neoplasm of overlapping sites of left female breast: Secondary | ICD-10-CM | POA: Insufficient documentation

## 2016-05-21 DIAGNOSIS — Z79899 Other long term (current) drug therapy: Secondary | ICD-10-CM | POA: Insufficient documentation

## 2016-05-21 DIAGNOSIS — Z87891 Personal history of nicotine dependence: Secondary | ICD-10-CM | POA: Insufficient documentation

## 2016-05-21 DIAGNOSIS — N183 Chronic kidney disease, stage 3 (moderate): Secondary | ICD-10-CM | POA: Diagnosis not present

## 2016-05-21 DIAGNOSIS — Z170 Estrogen receptor positive status [ER+]: Principal | ICD-10-CM

## 2016-05-21 DIAGNOSIS — Z7982 Long term (current) use of aspirin: Secondary | ICD-10-CM | POA: Insufficient documentation

## 2016-05-21 DIAGNOSIS — I252 Old myocardial infarction: Secondary | ICD-10-CM | POA: Diagnosis not present

## 2016-05-21 DIAGNOSIS — K8020 Calculus of gallbladder without cholecystitis without obstruction: Secondary | ICD-10-CM | POA: Diagnosis not present

## 2016-05-21 DIAGNOSIS — Z9013 Acquired absence of bilateral breasts and nipples: Secondary | ICD-10-CM | POA: Insufficient documentation

## 2016-05-21 DIAGNOSIS — N8189 Other female genital prolapse: Secondary | ICD-10-CM | POA: Insufficient documentation

## 2016-05-21 DIAGNOSIS — K5730 Diverticulosis of large intestine without perforation or abscess without bleeding: Secondary | ICD-10-CM | POA: Insufficient documentation

## 2016-05-21 DIAGNOSIS — I129 Hypertensive chronic kidney disease with stage 1 through stage 4 chronic kidney disease, or unspecified chronic kidney disease: Secondary | ICD-10-CM | POA: Diagnosis not present

## 2016-05-21 DIAGNOSIS — I700 Atherosclerosis of aorta: Secondary | ICD-10-CM | POA: Diagnosis not present

## 2016-05-21 DIAGNOSIS — Z79811 Long term (current) use of aromatase inhibitors: Secondary | ICD-10-CM | POA: Diagnosis not present

## 2016-05-21 DIAGNOSIS — D259 Leiomyoma of uterus, unspecified: Secondary | ICD-10-CM | POA: Insufficient documentation

## 2016-05-21 DIAGNOSIS — I2510 Atherosclerotic heart disease of native coronary artery without angina pectoris: Secondary | ICD-10-CM | POA: Diagnosis not present

## 2016-05-21 LAB — CBC WITH DIFFERENTIAL/PLATELET
BASOS PCT: 2 %
Basophils Absolute: 0.1 10*3/uL (ref 0–0.1)
EOS PCT: 2 %
Eosinophils Absolute: 0.1 10*3/uL (ref 0–0.7)
HEMATOCRIT: 36.1 % (ref 35.0–47.0)
Hemoglobin: 12.4 g/dL (ref 12.0–16.0)
Lymphocytes Relative: 24 %
Lymphs Abs: 0.7 10*3/uL — ABNORMAL LOW (ref 1.0–3.6)
MCH: 34.2 pg — ABNORMAL HIGH (ref 26.0–34.0)
MCHC: 34.3 g/dL (ref 32.0–36.0)
MCV: 99.7 fL (ref 80.0–100.0)
MONO ABS: 0.2 10*3/uL (ref 0.2–0.9)
MONOS PCT: 6 %
NEUTROS ABS: 2 10*3/uL (ref 1.4–6.5)
Neutrophils Relative %: 66 %
Platelets: 188 10*3/uL (ref 150–440)
RBC: 3.63 MIL/uL — ABNORMAL LOW (ref 3.80–5.20)
RDW: 16.4 % — AB (ref 11.5–14.5)
WBC: 3 10*3/uL — ABNORMAL LOW (ref 3.6–11.0)

## 2016-05-21 LAB — COMPREHENSIVE METABOLIC PANEL
ALBUMIN: 3.9 g/dL (ref 3.5–5.0)
ALK PHOS: 95 U/L (ref 38–126)
ALT: 18 U/L (ref 14–54)
AST: 21 U/L (ref 15–41)
Anion gap: 7 (ref 5–15)
BILIRUBIN TOTAL: 0.8 mg/dL (ref 0.3–1.2)
BUN: 18 mg/dL (ref 6–20)
CO2: 28 mmol/L (ref 22–32)
CREATININE: 1.1 mg/dL — AB (ref 0.44–1.00)
Calcium: 9.8 mg/dL (ref 8.9–10.3)
Chloride: 100 mmol/L — ABNORMAL LOW (ref 101–111)
GFR calc Af Amer: 57 mL/min — ABNORMAL LOW (ref >60)
GFR calc non Af Amer: 49 mL/min — ABNORMAL LOW (ref >60)
GLUCOSE: 107 mg/dL — AB (ref 65–99)
POTASSIUM: 4.3 mmol/L (ref 3.5–5.1)
Sodium: 135 mmol/L (ref 135–145)
TOTAL PROTEIN: 7.3 g/dL (ref 6.5–8.1)

## 2016-05-21 MED ORDER — IOPAMIDOL (ISOVUE-300) INJECTION 61%
125.0000 mL | Freq: Once | INTRAVENOUS | Status: AC | PRN
  Administered 2016-05-21: 125 mL via INTRAVENOUS

## 2016-05-22 LAB — CANCER ANTIGEN 27.29: CA 27.29: 17.5 U/mL (ref 0.0–38.6)

## 2016-05-25 ENCOUNTER — Inpatient Hospital Stay (HOSPITAL_BASED_OUTPATIENT_CLINIC_OR_DEPARTMENT_OTHER): Payer: Medicare Other | Admitting: Internal Medicine

## 2016-05-25 VITALS — BP 103/67 | HR 70 | Temp 98.4°F | Resp 16 | Wt 236.3 lb

## 2016-05-25 DIAGNOSIS — C50812 Malignant neoplasm of overlapping sites of left female breast: Secondary | ICD-10-CM | POA: Diagnosis not present

## 2016-05-25 DIAGNOSIS — I129 Hypertensive chronic kidney disease with stage 1 through stage 4 chronic kidney disease, or unspecified chronic kidney disease: Secondary | ICD-10-CM

## 2016-05-25 DIAGNOSIS — N183 Chronic kidney disease, stage 3 (moderate): Secondary | ICD-10-CM | POA: Diagnosis not present

## 2016-05-25 DIAGNOSIS — Z87891 Personal history of nicotine dependence: Secondary | ICD-10-CM

## 2016-05-25 DIAGNOSIS — Z79811 Long term (current) use of aromatase inhibitors: Secondary | ICD-10-CM | POA: Diagnosis not present

## 2016-05-25 DIAGNOSIS — Z9013 Acquired absence of bilateral breasts and nipples: Secondary | ICD-10-CM | POA: Diagnosis not present

## 2016-05-25 DIAGNOSIS — K5730 Diverticulosis of large intestine without perforation or abscess without bleeding: Secondary | ICD-10-CM | POA: Diagnosis not present

## 2016-05-25 DIAGNOSIS — Z170 Estrogen receptor positive status [ER+]: Secondary | ICD-10-CM | POA: Diagnosis not present

## 2016-05-25 DIAGNOSIS — Z79899 Other long term (current) drug therapy: Secondary | ICD-10-CM

## 2016-05-25 DIAGNOSIS — Z7982 Long term (current) use of aspirin: Secondary | ICD-10-CM | POA: Diagnosis not present

## 2016-05-25 DIAGNOSIS — I252 Old myocardial infarction: Secondary | ICD-10-CM | POA: Diagnosis not present

## 2016-05-25 NOTE — Progress Notes (Signed)
[place] OFFICE PROGRESS NOTE  Patient Care Team: Patient, No Pcp Per as PCP - General (General Practice) [name], MD (Unknown Physician Specialty) [name], MD (General Surgery)  Cancer Staging Breast cancer [facility]) Staging form: Breast, AJCC 7th Edition - Clinical: Stage IV (T3, N3, M1) - Signed by [name], MD on 06/14/2014    Oncology History   1. 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  Biopsy from the lymph node in the left axilla positive for metastatic memory carcinoma Estrogen receptor positive Progesterone receptor +ve hER-2/neu receptors equivocal  by [facility]  IHC for HER-2/neu is 2+ Clinically staged asT4 D. N1 M0 tumor stage IV  locally advanced carcinoma.. 2. She was started on   [place] OF 2015. 3. Treatment was changed to Cytoxan and Adriamycinffrom August 31, 2013  4.patient has finished oral 3 cycles of chemotherapy with Cytoxan and Adriamycin on October 19, 2013. 5.Patient had a bilateral mastectomy in November of 2015. ypT4B ypN3 ypM1 STAGE iv DISEASE.. repeat HER-2/neu receptor by [facility] is still equivocal (November, 2015).  6.  Started on Nov 2015-  letrozole and IBRANCE ; PET may 2017- NED; NOV 1st 2017- NED   # # Acute MI- s/p stenting [[facility]]  # RML 4 mm nodule- 1st NOV 2017- resolved     Breast cancer ([place])   11/13/2013 Initial Diagnosis    Breast cancer       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 female, estrogen receptor positive ([place])     INTERVAL HISTORY:  Faith [name] 71 y.o.  female pleasant patient above history of Stage IV breast cancer/ NED currently on Femara plus ibrance since November 2015 is here for follow-up/ review the CT scans..  Patient denies any unusual aches and pains. No shortness of breath or chest pain. Denies any headaches. No nausea no vomiting. No  diarrhea. No fevers. Not losing any weight.  REVIEW OF SYSTEMS:  A complete 10 point review of system is done which is negative except mentioned above/history of present illness.   PAST MEDICAL HISTORY :  Past Medical History:  Diagnosis Date  . Cancer [facility]) April 2015   left breast  . Hypertension   . Myocardial infarction 11/03/2015   [facility], stent placed    PAST SURGICAL HISTORY :   Past Surgical History:  Procedure Laterality Date  . BREAST SURGERY Bilateral 11/19/13   bilater mastectomy   . PORTACATH PLACEMENT  06-07-13  . SHOULDER OPEN ROTATOR CUFF REPAIR  2012  . WRIST SURGERY Left 1969   cyst    FAMILY HISTORY :   Family History  Problem Relation Age of Onset  . Adopted: Yes    SOCIAL HISTORY:   Social History  Substance Use Topics  . Smoking status: Former Smoker    Packs/day: 1.00    Years: 15.00    Types: Cigarettes    Quit date: 06/12/1985  . Smokeless tobacco: Never Used  . Alcohol use 0.0 oz/week    ALLERGIES:  is allergic to mushroom extract complex and shellfish allergy.  MEDICATIONS:  Current Outpatient Prescriptions  Medication Sig Dispense Refill  . acetaminophen (TYLENOL) 500 MG tablet Take 500 mg by mouth every 6 (six) hours as needed.    Marland Kitchen aspirin EC 81 MG tablet Take 81 mg by mouth daily.     Marland Kitchen atorvastatin (LIPITOR) 80 MG  tablet Take 80 mg by mouth daily.     Marland Kitchen BRILINTA 90 MG TABS tablet Take 90 mg by mouth 2 (two) times daily.    . Calcium Carbonate-Vitamin D (CALCIUM + D PO) Take 1 tablet by mouth daily.    . Cholecalciferol (VITAMIN D PO) Take 5,000 Units by mouth daily.    [name] 125 MG capsule Take 1 capsule (125 mg total) by mouth daily with breakfast. Take for 3 weeks, then 1 week off. 21 capsule 4  . letrozole (FEMARA) 2.5 MG tablet TAKE 1 TABLET BY MOUTH  DAILY 90 tablet 1  . losartan (COZAAR) 25 MG tablet Take 25 mg by mouth daily.    . metoprolol tartrate (LOPRESSOR) 25 MG tablet Take 25 mg by mouth 2 (two) times daily.     .  nitroGLYCERIN (NITROSTAT) 0.4 MG SL tablet Place under the tongue.    . ondansetron (ZOFRAN-ODT) 8 MG disintegrating tablet Take by mouth every 8 (eight) hours as needed.      No current facility-administered medications for this visit.    Facility-Administered Medications Ordered in Other Visits  Medication Dose Route Frequency Provider Last Rate Last Dose  . sodium chloride 0.9 % injection 10 mL  10 mL Intravenous PRN [name], MD   10 mL at 09/12/14 1014    PHYSICAL EXAMINATION: ECOG PERFORMANCE STATUS: 0 - Asymptomatic  BP 103/67 (BP Location: Right Arm, Patient Position: Sitting)   Pulse 70   Temp 98.4 F (36.9 C) (Tympanic)   Resp 16   Wt 236 lb 5.3 oz (107.2 kg)   BMI 37.02 kg/m   Filed Weights   05/25/16 0934  Weight: 236 lb 5.3 oz (107.2 kg)    GENERAL: Well-nourished well-developed; Alert, no distress and comfortable.  Alone. EYES: no pallor or icterus OROPHARYNX: no thrush or ulceration; good dentition  NECK: supple, no masses felt LYMPH:  no palpable lymphadenopathy in the cervical, axillary or inguinal regions LUNGS: clear to auscultation and  No wheeze or crackles HEART/CVS: regular rate & rhythm and no murmurs; No lower extremity edema ABDOMEN:abdomen soft, non-tender and normal bowel sounds Musculoskeletal:no cyanosis of digits and no clubbing  PSYCH: alert & oriented x 3 with fluent speech NEURO: no focal motor/sensory deficits SKIN:  no rashes or significant lesions  LABORATORY DATA:  I have reviewed the data as listed    Component Value Date/Time   NA 135 05/21/2016 0829   NA 137 05/16/2014 1154   K 4.3 05/21/2016 0829   K 3.9 05/16/2014 1154   CL 100 (L) 05/21/2016 0829   CL 104 05/16/2014 1154   CO2 28 05/21/2016 0829   CO2 25 05/16/2014 1154   GLUCOSE 107 (H) 05/21/2016 0829   GLUCOSE 110 (H) 05/16/2014 1154   BUN 18 05/21/2016 0829   BUN 20 05/16/2014 1154   CREATININE 1.10 (H) 05/21/2016 0829   CREATININE 1.00 05/16/2014 1154    CALCIUM 9.8 05/21/2016 0829   CALCIUM 9.6 05/16/2014 1154   PROT 7.3 05/21/2016 0829   PROT 7.4 05/16/2014 1154   ALBUMIN 3.9 05/21/2016 0829   ALBUMIN 4.0 05/16/2014 1154   AST 21 05/21/2016 0829   AST 26 05/16/2014 1154   ALT 18 05/21/2016 0829   ALT 37 05/16/2014 1154   ALKPHOS 95 05/21/2016 0829   ALKPHOS 72 05/16/2014 1154   BILITOT 0.8 05/21/2016 0829   BILITOT 0.5 05/16/2014 1154   GFRNONAA 49 (L) 05/21/2016 0829   GFRNONAA 57 (L) 05/16/2014 1154  GFRAA 57 (L) 05/21/2016 0829   GFRAA >60 05/16/2014 1154    No results found for: SPEP, UPEP  Lab Results  Component Value Date   WBC 3.0 (L) 05/21/2016   NEUTROABS 2.0 05/21/2016   HGB 12.4 05/21/2016   HCT 36.1 05/21/2016   MCV 99.7 05/21/2016   PLT 188 05/21/2016      Chemistry      Component Value Date/Time   NA 135 05/21/2016 0829   NA 137 05/16/2014 1154   K 4.3 05/21/2016 0829   K 3.9 05/16/2014 1154   CL 100 (L) 05/21/2016 0829   CL 104 05/16/2014 1154   CO2 28 05/21/2016 0829   CO2 25 05/16/2014 1154   BUN 18 05/21/2016 0829   BUN 20 05/16/2014 1154   CREATININE 1.10 (H) 05/21/2016 0829   CREATININE 1.00 05/16/2014 1154      Component Value Date/Time   CALCIUM 9.8 05/21/2016 0829   CALCIUM 9.6 05/16/2014 1154   ALKPHOS 95 05/21/2016 0829   ALKPHOS 72 05/16/2014 1154   AST 21 05/21/2016 0829   AST 26 05/16/2014 1154   ALT 18 05/21/2016 0829   ALT 37 05/16/2014 1154   BILITOT 0.8 05/21/2016 0829   BILITOT 0.5 05/16/2014 1154     IMPRESSION: Chest Impression:  No evidence of thoracic metastasis.  Abdomen / Pelvis Impression:  1. No evidence of abdominal or pelvic metastasis. 2. No skeletal metastasis. 3. Colonic diverticulosis.   Electronically Signed   By: Suzy Bouchard M.D.   On: 11/19/2015 11:40  RADIOGRAPHIC STUDIES: I have personally reviewed the radiological images as listed and agreed with the findings in the report. No results found.   ASSESSMENT & PLAN:   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 female, estrogen receptor positive ([place]) Breast cancer stage IV NED- currently on letrozole and ibrance [since 2015].  May 5th CT chest/A/P-NED.  Tolerating treatments well. Clinically no evidence of progression. Continue ibrance and letrozole.  # Acute MI- s/p stenting [[facility]]- on anti-platelet therapy; no further events. Recent work up- no new issues.    # CKD- stage III- stable.   # Follow-up in 3 months/CBC CMP CA 27-29.  # I reviewed the blood work- with the patient in detail; also reviewed the imaging independently [as summarized above]; and with the patient in detail.   # 25 minutes face-to-face with the patient discussing the above plan of care; more than 50% of time spent on prognosis/ natural history; counseling and coordination.   Orders Placed This Encounter  Procedures  . CBC with Differential/Platelet    Standing Status:   Future    Standing Expiration Date:   05/25/2017  . Comprehensive metabolic panel    Standing Status:   Future    Standing Expiration Date:   05/25/2017  . Cancer antigen 27.29    Standing Status:   Future    Standing Expiration Date:   05/25/2017   All questions were answered. The patient knows to call the clinic with any problems, questions or concerns.     Cammie Sickle, MD 05/26/2016 5:22 PM

## 2016-05-25 NOTE — Progress Notes (Signed)
Patient here today for follow up.  Patient states no new concerns today  

## 2016-05-25 NOTE — Assessment & Plan Note (Addendum)
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  May 5th CT chest/A/P-NED.  Tolerating treatments well. Clinically no evidence of progression. Continue ibrance and letrozole.  # Acute MI- s/p stenting [[facility]]- on anti-platelet therapy; no further events. Recent work up- no new issues.    # CKD- stage III- stable.   # Follow-up in 3 months/CBC CMP CA 27-29.  # I reviewed the blood work- with the patient in detail; also reviewed the imaging independently [as summarized above]; and with the patient in detail.   # 25 minutes face-to-face with the patient discussing the above plan of care; more than 50% of time spent on prognosis/ natural history; counseling and coordination.

## 2016-06-30 ENCOUNTER — Other Ambulatory Visit: Payer: Self-pay | Admitting: Internal Medicine

## 2016-06-30 DIAGNOSIS — C50912 Malignant neoplasm of unspecified site of left female breast: Secondary | ICD-10-CM

## 2016-08-11 ENCOUNTER — Ambulatory Visit
Admission: EM | Admit: 2016-08-11 | Discharge: 2016-08-11 | Disposition: A | Discharge status: Home / Self Care | Payer: Medicare Other | Attending: Family Medicine | Admitting: Family Medicine

## 2016-08-11 ENCOUNTER — Ambulatory Visit (INDEPENDENT_AMBULATORY_CARE_PROVIDER_SITE_OTHER): Payer: Medicare Other

## 2016-08-11 DIAGNOSIS — I10 Essential (primary) hypertension: Secondary | ICD-10-CM | POA: Diagnosis not present

## 2016-08-11 DIAGNOSIS — M25562 Pain in left knee: Secondary | ICD-10-CM

## 2016-08-11 DIAGNOSIS — M1712 Unilateral primary osteoarthritis, left knee: Secondary | ICD-10-CM

## 2016-08-11 MED ORDER — PREDNISONE 20 MG PO TABS: 20.0000 mg | ORAL_TABLET | Freq: Every day | ORAL | 0 refills | Status: DC

## 2016-08-11 MED ORDER — HYDROCODONE-ACETAMINOPHEN 5-325 MG PO TABS: ORAL_TABLET | ORAL | 0 refills | Status: DC

## 2016-08-11 NOTE — ED Triage Notes (Signed)
Patient complains of left knee pain that started several weeks ago without known injury. Patient states that pain is worse with walking or going up steps.

## 2016-08-11 NOTE — ED Provider Notes (Signed)
[facility]    CSN: [id number] Arrival date & time: 08/11/16  0827     History   Chief Complaint Chief Complaint  Patient presents with  . Knee Pain    left    HPI Faith [name] is a 71 y.o. female.   The history is provided by the patient.  Knee Pain  Location:  Knee Time since incident:  2 weeks Injury: no   Knee location:  L knee Pain details:    Quality:  Aching   Radiates to:  Does not radiate Chronicity:  New Dislocation: no   Prior injury to area:  Yes Relieved by:  Acetaminophen Worsened by:  Activity Associated symptoms: no back pain, no decreased ROM, no fatigue, no fever, no itching, no muscle weakness, no neck pain, no numbness, no stiffness, no swelling and no tingling   Risk factors: no concern for non-accidental trauma, no frequent fractures, no known bone disorder and no recent illness     Past Medical History:  Diagnosis Date  . Cancer [facility]) April 2015   left breast  . Hypertension   . Myocardial infarction [facility]) 11/03/2015   [facility], stent placed    Patient Active Problem List   Diagnosis Date Noted  . 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 female, estrogen receptor positive ([place]) 09/09/2015  . Breast cancer ([place]) 11/13/2013    Past Surgical History:  Procedure Laterality Date  . BREAST SURGERY Bilateral 11/19/13   bilater mastectomy   . PORTACATH PLACEMENT  06-07-13  . SHOULDER OPEN ROTATOR CUFF REPAIR  2012  . WRIST SURGERY Left 1969   cyst    OB History    Gravida Para Term Preterm AB Living   0 0 0 0 0 0   SAB TAB Ectopic Multiple Live Births   0 0 0 0         Home Medications    Prior to Admission medications   Medication Sig Start Date End Date Taking? Authorizing Provider  acetaminophen (TYLENOL) 500 MG tablet Take 500 mg by mouth every 6 (six) hours as needed.   Yes [provider]  aspirin EC 81 MG tablet Take 81 mg by mouth daily.  11/05/15 11/04/16 Yes [provider]    atorvastatin (LIPITOR) 80 MG tablet Take 80 mg by mouth daily.  11/05/15 11/04/16 Yes [provider]  BRILINTA 90 MG TABS tablet Take 90 mg by mouth 2 (two) times daily. 04/14/16  Yes [provider]  Calcium Carbonate-Vitamin D (CALCIUM + D PO) Take 1 tablet by mouth daily.   Yes [provider]  Cholecalciferol (VITAMIN D PO) Take 5,000 Units by mouth daily.   Yes [provider]  IBRANCE 125 MG capsule Take 1 capsule (125 mg total) by mouth daily with breakfast. Take for 3 weeks, then 1 week off. 12/22/15  Yes Cammie Sickle, MD  letrozole [facility]) 2.5 MG tablet TAKE 1 TABLET BY MOUTH  DAILY 06/30/16  Yes Cammie Sickle, MD  losartan (COZAAR) 25 MG tablet Take 25 mg by mouth daily.   Yes [provider]  metoprolol tartrate (LOPRESSOR) 25 MG tablet Take 25 mg by mouth 2 (two) times daily.  11/05/15 11/04/16 Yes [provider]  nitroGLYCERIN (NITROSTAT) 0.4 MG SL tablet Place under the tongue. 11/05/15 11/04/16 Yes [provider]  ondansetron (ZOFRAN-ODT) 8 MG disintegrating tablet Take by mouth every 8 (eight) hours as needed.  11/23/13  Yes [provider]  HYDROcodone-acetaminophen (  NORCO/VICODIN) 5-325 MG tablet 1 tab po qd prn 08/11/16   Norval Gable, MD  predniSONE (DELTASONE) 20 MG tablet Take 1 tablet (20 mg total) by mouth daily. 08/11/16   Norval Gable, MD    Family History Family History  Problem Relation Age of Onset  . Adopted: Yes    Social History Social History  Substance Use Topics  . Smoking status: Former Smoker    Packs/day: 1.00    Years: 15.00    Types: Cigarettes    Quit date: 06/12/1985  . Smokeless tobacco: Never Used  . Alcohol use 0.0 oz/week     Allergies   Mushroom extract complex and Shellfish allergy   Review of Systems Review of Systems  Constitutional: Negative for fatigue and fever.  Musculoskeletal: Negative for back pain, neck pain and stiffness.   Skin: Negative for itching.     Physical Exam Triage Vital Signs ED Triage Vitals  Enc Vitals Group     BP 08/11/16 0838 (!) 141/67     Pulse Rate 08/11/16 0838 65     Resp 08/11/16 0838 17     Temp 08/11/16 0838 98.2 F (36.8 C)     Temp Source 08/11/16 0838 Oral     SpO2 08/11/16 0838 99 %     Weight 08/11/16 0836 240 lb (108.9 kg)     Height 08/11/16 0836 5\' 7"  (1.702 m)     Head Circumference --      Peak Flow --      Pain Score 08/11/16 0836 6     Pain Loc --      Pain Edu? --      Excl. in [place]? --    No data found.   Updated Vital Signs BP (!) 141/67 (BP Location: Right Arm)   Pulse 65   Temp 98.2 F (36.8 C) (Oral)   Resp 17   Ht 5\' 7"  (1.702 m)   Wt 240 lb (108.9 kg)   SpO2 99%   BMI 37.59 kg/m   Visual Acuity Right Eye Distance:   Left Eye Distance:   Bilateral Distance:    Right Eye Near:   Left Eye Near:    Bilateral Near:     Physical Exam  Constitutional: She appears well-developed and well-nourished. No distress.  Musculoskeletal:       Left knee: She exhibits normal range of motion, no swelling, no effusion, no ecchymosis, no deformity, no laceration, no erythema, normal alignment, no LCL laxity, normal patellar mobility, normal meniscus and no MCL laxity. Tenderness found. Medial joint line tenderness noted.  Skin: She is not diaphoretic.  Nursing note and vitals reviewed.    UC Treatments / Results  Labs (all labs ordered are listed, but only abnormal results are displayed) Labs Reviewed - No data to display  EKG  EKG Interpretation None       Radiology Dg Knee Complete 4 Views Left  Result Date: 08/11/2016 CLINICAL DATA:  Medial left knee pain. EXAM: LEFT KNEE - COMPLETE 4+ VIEW COMPARISON:  05/18/2013 FINDINGS: New medial joint space narrowing with chronic small marginal osteophyte formation in the patellofemoral and medial compartments. No joint effusion. IMPRESSION: Progressive osteoarthritis in the medial compartment with  new medial joint space narrowing. Electronically Signed   By: Lorriane Shire M.D.   On: 08/11/2016 09:26    Procedures Procedures (including critical care time)  Medications Ordered in UC Medications - No data to display   Initial Impression / Assessment and Plan / UC  Course  I have reviewed the triage vital signs and the nursing notes.  Pertinent labs & imaging results that were available during my care of the patient were reviewed by me and considered in my medical decision making (see chart for details).       Final Clinical Impressions(s) / UC Diagnoses   Final diagnoses:  Acute pain of left knee  Arthritis of knee, left    New Prescriptions Discharge Medication List as of 08/11/2016  9:32 AM    START taking these medications   Details  HYDROcodone-acetaminophen (NORCO/VICODIN) 5-325 MG tablet 1 tab po qd prn, Print    predniSONE (DELTASONE) 20 MG tablet Take 1 tablet (20 mg total) by mouth daily., Starting Wed 08/11/2016, Normal       1. X-ray results and  diagnosis reviewed with patient 2. rx as per orders above; reviewed possible side effects, interactions, risks and benefits  3. Recommend supportive treatment with rest, ice 4. Follow-up prn if symptoms worsen or don't improve   Norval Gable, MD 08/11/16 1545

## 2016-08-23 NOTE — Assessment & Plan Note (Addendum)
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  May 5th CT chest/A/P-NED.  Tolerating treatments well. Clinically no evidence of progression. Continue ibrance and letrozole.  # CAD s/p stenting [[facility]]- on anti-platelet therapy; no further events.  # Arthritis- Tylenol prn.   # CKD- stage III- creat 1.17 stable.   # Follow-up in 3 months/CBC CMP CA 27-29; will order scan at that visit.

## 2016-08-23 NOTE — Progress Notes (Signed)
[place] OFFICE PROGRESS NOTE  Patient Care Team: Patient, No Pcp Per as PCP - General (General Practice) [name], MD (Unknown Physician Specialty) [name], MD (General Surgery)  Cancer Staging No matching staging information was found for the patient.    Oncology History   1. 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  Biopsy from the lymph node in the left axilla positive for metastatic memory carcinoma Estrogen receptor positive Progesterone receptor +ve hER-2/neu receptors equivocal  by [facility]  IHC for HER-2/neu is 2+ Clinically staged asT4 D. N1 M0 tumor stage IV  locally advanced carcinoma.. 2. She was started on   [place] OF 2015. 3. Treatment was changed to Cytoxan and Adriamycinffrom August 31, 2013  4.patient has finished oral 3 cycles of chemotherapy with Cytoxan and Adriamycin on October 19, 2013. 5.Patient had a bilateral mastectomy in November of 2015. ypT4B ypN3 ypM1 STAGE iv DISEASE.. repeat HER-2/neu receptor by [facility] is still equivocal (November, 2015).  6.  Started on Nov 2015-  letrozole and IBRANCE ; PET may 2017- NED; NOV 1st 2017- NED   # # Acute MI- s/p stenting [[facility]]  # RML 4 mm nodule- 1st NOV 2017- resolved     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 female, estrogen receptor positive ([place])     INTERVAL HISTORY:  Faith [name] 71 y.o.  female pleasant patient above history of Stage IV breast cancer/ NED currently on Femara plus ibrance since November 2015 is here for follow-up.  Patient complains of chronic joint pains/arthritic pain. Otherwise denies any new onset of bone pain. No shortness of breath or chest pain. Denies any headaches. No nausea no vomiting. No diarrhea. No fevers. Not losing any weight.  REVIEW OF SYSTEMS:  A complete 10 point review of system is done which is negative  except mentioned above/history of present illness.   PAST MEDICAL HISTORY :  Past Medical History:  Diagnosis Date  . Cancer [facility]) April 2015   left breast  . Hypertension   . Myocardial infarction ([place]) 11/03/2015   [facility], stent placed    PAST SURGICAL HISTORY :   Past Surgical History:  Procedure Laterality Date  . BREAST SURGERY Bilateral 11/19/13   bilater mastectomy   . PORTACATH PLACEMENT  06-07-13  . SHOULDER OPEN ROTATOR CUFF REPAIR  2012  . WRIST SURGERY Left 1969   cyst    FAMILY HISTORY :   Family History  Problem Relation Age of Onset  . Adopted: Yes    SOCIAL HISTORY:   Social History  Substance Use Topics  . Smoking status: Former Smoker    Packs/day: 1.00    Years: 15.00    Types: Cigarettes    Quit date: 06/12/1985  . Smokeless tobacco: Never Used  . Alcohol use 0.0 oz/week    ALLERGIES:  is allergic to mushroom extract complex and shellfish allergy.  MEDICATIONS:  Current Outpatient Prescriptions  Medication Sig Dispense Refill  . acetaminophen (TYLENOL) 500 MG tablet Take 500 mg by mouth every 6 (six) hours as needed.    Marland Kitchen aspirin EC 81 MG tablet Take 81 mg by mouth daily.     Marland Kitchen atorvastatin (LIPITOR) 80 MG tablet Take 80 mg by mouth daily.     Marland Kitchen BRILINTA 90 MG TABS tablet Take 90 mg by mouth 2 (two) times daily.    Marland Kitchen  Calcium Carbonate-Vitamin D (CALCIUM + D PO) Take 1 tablet by mouth daily.    . Cholecalciferol (VITAMIN D PO) Take 5,000 Units by mouth daily.    [name] 125 MG capsule Take 1 capsule (125 mg total) by mouth daily with breakfast. Take for 3 weeks, then 1 week off. 21 capsule 4  . letrozole (FEMARA) 2.5 MG tablet TAKE 1 TABLET BY MOUTH  DAILY 90 tablet 1  . losartan (COZAAR) 25 MG tablet Take 25 mg by mouth daily.    . metoprolol tartrate (LOPRESSOR) 25 MG tablet Take 25 mg by mouth 2 (two) times daily.     . nitroGLYCERIN (NITROSTAT) 0.4 MG SL tablet Place under the tongue.    . ondansetron (ZOFRAN-ODT) 8 MG disintegrating tablet  Take by mouth every 8 (eight) hours as needed.     Marland Kitchen HYDROcodone-acetaminophen (NORCO/VICODIN) 5-325 MG tablet 1 tab po qd prn (Patient not taking: Reported on 08/24/2016) 6 tablet 0   No current facility-administered medications for this visit.    Facility-Administered Medications Ordered in Other Visits  Medication Dose Route Frequency Provider Last Rate Last Dose  . sodium chloride 0.9 % injection 10 mL  10 mL Intravenous PRN [name], MD   10 mL at 09/12/14 1014    PHYSICAL EXAMINATION: ECOG PERFORMANCE STATUS: 0 - Asymptomatic  BP 136/76 (BP Location: Right Arm, Patient Position: Sitting)   Pulse 71   Temp (!) 97.1 F (36.2 C) (Tympanic)   Wt 239 lb 8.5 oz (108.7 kg)   BMI 37.52 kg/m   Filed Weights   08/24/16 0832  Weight: 239 lb 8.5 oz (108.7 kg)    GENERAL: Well-nourished well-developed; Alert, no distress and comfortable.  Alone. EYES: no pallor or icterus OROPHARYNX: no thrush or ulceration; good dentition  NECK: supple, no masses felt LYMPH:  no palpable lymphadenopathy in the cervical, axillary or inguinal regions LUNGS: clear to auscultation and  No wheeze or crackles HEART/CVS: regular rate & rhythm and no murmurs; No lower extremity edema ABDOMEN:abdomen soft, non-tender and normal bowel sounds Musculoskeletal:no cyanosis of digits and no clubbing  PSYCH: alert & oriented x 3 with fluent speech NEURO: no focal motor/sensory deficits SKIN:  no rashes or significant lesions  LABORATORY DATA:  I have reviewed the data as listed    Component Value Date/Time   NA 137 08/24/2016 0816   NA 137 05/16/2014 1154   K 4.1 08/24/2016 0816   K 3.9 05/16/2014 1154   CL 104 08/24/2016 0816   CL 104 05/16/2014 1154   CO2 28 08/24/2016 0816   CO2 25 05/16/2014 1154   GLUCOSE 148 (H) 08/24/2016 0816   GLUCOSE 110 (H) 05/16/2014 1154   BUN 21 (H) 08/24/2016 0816   BUN 20 05/16/2014 1154   CREATININE 1.17 (H) 08/24/2016 0816   CREATININE 1.00 05/16/2014 1154    CALCIUM 9.6 08/24/2016 0816   CALCIUM 9.6 05/16/2014 1154   PROT 6.8 08/24/2016 0816   PROT 7.4 05/16/2014 1154   ALBUMIN 3.7 08/24/2016 0816   ALBUMIN 4.0 05/16/2014 1154   AST 26 08/24/2016 0816   AST 26 05/16/2014 1154   ALT 25 08/24/2016 0816   ALT 37 05/16/2014 1154   ALKPHOS 88 08/24/2016 0816   ALKPHOS 72 05/16/2014 1154   BILITOT 1.1 08/24/2016 0816   BILITOT 0.5 05/16/2014 1154   GFRNONAA 45 (L) 08/24/2016 0816   GFRNONAA 57 (L) 05/16/2014 1154   GFRAA 53 (L) 08/24/2016 0816   GFRAA >60 05/16/2014 1154  No results found for: SPEP, UPEP  Lab Results  Component Value Date   WBC 3.6 08/24/2016   NEUTROABS 2.3 08/24/2016   HGB 12.3 08/24/2016   HCT 35.6 08/24/2016   MCV 101.8 (H) 08/24/2016   PLT 201 08/24/2016      Chemistry      Component Value Date/Time   NA 137 08/24/2016 0816   NA 137 05/16/2014 1154   K 4.1 08/24/2016 0816   K 3.9 05/16/2014 1154   CL 104 08/24/2016 0816   CL 104 05/16/2014 1154   CO2 28 08/24/2016 0816   CO2 25 05/16/2014 1154   BUN 21 (H) 08/24/2016 0816   BUN 20 05/16/2014 1154   CREATININE 1.17 (H) 08/24/2016 0816   CREATININE 1.00 05/16/2014 1154      Component Value Date/Time   CALCIUM 9.6 08/24/2016 0816   CALCIUM 9.6 05/16/2014 1154   ALKPHOS 88 08/24/2016 0816   ALKPHOS 72 05/16/2014 1154   AST 26 08/24/2016 0816   AST 26 05/16/2014 1154   ALT 25 08/24/2016 0816   ALT 37 05/16/2014 1154   BILITOT 1.1 08/24/2016 0816   BILITOT 0.5 05/16/2014 1154     IMPRESSION: Chest Impression:  No evidence of thoracic metastasis.  Abdomen / Pelvis Impression:  1. No evidence of abdominal or pelvic metastasis. 2. No skeletal metastasis. 3. Colonic diverticulosis.   Electronically Signed   By: Suzy Bouchard M.D.   On: 11/19/2015 11:40  RADIOGRAPHIC STUDIES: I have personally reviewed the radiological images as listed and agreed with the findings in the report. No results found.   ASSESSMENT & PLAN:   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 female, estrogen receptor positive ([place]) Breast cancer stage IV NED- currently on letrozole and ibrance [since 2015].  May 5th CT chest/A/P-NED.  Tolerating treatments well. Clinically no evidence of progression. Continue ibrance and letrozole.  # CAD s/p stenting [[facility]]- on anti-platelet therapy; no further events.  # Arthritis- Tylenol prn.   # CKD- stage III- creat 1.17 stable.   # Follow-up in 3 months/CBC CMP CA 27-29; will order scan at that visit.   Orders Placed This Encounter  Procedures  . CBC with Differential    Standing Status:   Future    Standing Expiration Date:   08/24/2017  . Comprehensive metabolic panel    Standing Status:   Future    Standing Expiration Date:   08/24/2017  . Cancer antigen 27.29    Standing Status:   Future    Standing Expiration Date:   08/24/2017   All questions were answered. The patient knows to call the clinic with any problems, questions or concerns.     Cammie Sickle, MD 08/24/2016 1:58 PM

## 2016-08-24 ENCOUNTER — Other Ambulatory Visit: Payer: Self-pay | Admitting: *Deleted

## 2016-08-24 ENCOUNTER — Inpatient Hospital Stay: Payer: Medicare Other

## 2016-08-24 ENCOUNTER — Telehealth: Payer: Self-pay | Admitting: Internal Medicine

## 2016-08-24 ENCOUNTER — Inpatient Hospital Stay: Payer: Medicare Other | Attending: Internal Medicine | Admitting: Internal Medicine

## 2016-08-24 VITALS — BP 136/76 | HR 71 | Temp 97.1°F | Wt 239.5 lb

## 2016-08-24 DIAGNOSIS — Z79811 Long term (current) use of aromatase inhibitors: Secondary | ICD-10-CM | POA: Diagnosis not present

## 2016-08-24 DIAGNOSIS — K5730 Diverticulosis of large intestine without perforation or abscess without bleeding: Secondary | ICD-10-CM | POA: Insufficient documentation

## 2016-08-24 DIAGNOSIS — N183 Chronic kidney disease, stage 3 (moderate): Secondary | ICD-10-CM

## 2016-08-24 DIAGNOSIS — Z7982 Long term (current) use of aspirin: Secondary | ICD-10-CM | POA: Insufficient documentation

## 2016-08-24 DIAGNOSIS — M2550 Pain in unspecified joint: Secondary | ICD-10-CM | POA: Diagnosis not present

## 2016-08-24 DIAGNOSIS — I10 Essential (primary) hypertension: Secondary | ICD-10-CM | POA: Insufficient documentation

## 2016-08-24 DIAGNOSIS — I2510 Atherosclerotic heart disease of native coronary artery without angina pectoris: Secondary | ICD-10-CM | POA: Insufficient documentation

## 2016-08-24 DIAGNOSIS — C50812 Malignant neoplasm of overlapping sites of left female breast: Secondary | ICD-10-CM | POA: Diagnosis not present

## 2016-08-24 DIAGNOSIS — Z87891 Personal history of nicotine dependence: Secondary | ICD-10-CM | POA: Insufficient documentation

## 2016-08-24 DIAGNOSIS — M129 Arthropathy, unspecified: Secondary | ICD-10-CM | POA: Diagnosis not present

## 2016-08-24 DIAGNOSIS — I252 Old myocardial infarction: Secondary | ICD-10-CM | POA: Insufficient documentation

## 2016-08-24 DIAGNOSIS — Z9013 Acquired absence of bilateral breasts and nipples: Secondary | ICD-10-CM | POA: Diagnosis not present

## 2016-08-24 DIAGNOSIS — Z170 Estrogen receptor positive status [ER+]: Secondary | ICD-10-CM | POA: Diagnosis not present

## 2016-08-24 DIAGNOSIS — I129 Hypertensive chronic kidney disease with stage 1 through stage 4 chronic kidney disease, or unspecified chronic kidney disease: Secondary | ICD-10-CM

## 2016-08-24 DIAGNOSIS — Z79899 Other long term (current) drug therapy: Secondary | ICD-10-CM | POA: Insufficient documentation

## 2016-08-24 LAB — CBC WITH DIFFERENTIAL/PLATELET
BASOS PCT: 3 %
Basophils Absolute: 0.1 10*3/uL (ref 0–0.1)
Eosinophils Absolute: 0.1 10*3/uL (ref 0–0.7)
Eosinophils Relative: 2 %
HEMATOCRIT: 35.6 % (ref 35.0–47.0)
Hemoglobin: 12.3 g/dL (ref 12.0–16.0)
Lymphocytes Relative: 21 %
Lymphs Abs: 0.7 10*3/uL — ABNORMAL LOW (ref 1.0–3.6)
MCH: 35.2 pg — AB (ref 26.0–34.0)
MCHC: 34.5 g/dL (ref 32.0–36.0)
MCV: 101.8 fL — ABNORMAL HIGH (ref 80.0–100.0)
MONO ABS: 0.4 10*3/uL (ref 0.2–0.9)
MONOS PCT: 12 %
NEUTROS ABS: 2.3 10*3/uL (ref 1.4–6.5)
Neutrophils Relative %: 62 %
Platelets: 201 10*3/uL (ref 150–440)
RBC: 3.5 MIL/uL — ABNORMAL LOW (ref 3.80–5.20)
RDW: 15.4 % — AB (ref 11.5–14.5)
WBC: 3.6 10*3/uL (ref 3.6–11.0)

## 2016-08-24 LAB — COMPREHENSIVE METABOLIC PANEL
ALBUMIN: 3.7 g/dL (ref 3.5–5.0)
ALT: 25 U/L (ref 14–54)
ANION GAP: 5 (ref 5–15)
AST: 26 U/L (ref 15–41)
Alkaline Phosphatase: 88 U/L (ref 38–126)
BILIRUBIN TOTAL: 1.1 mg/dL (ref 0.3–1.2)
BUN: 21 mg/dL — ABNORMAL HIGH (ref 6–20)
CO2: 28 mmol/L (ref 22–32)
Calcium: 9.6 mg/dL (ref 8.9–10.3)
Chloride: 104 mmol/L (ref 101–111)
Creatinine, Ser: 1.17 mg/dL — ABNORMAL HIGH (ref 0.44–1.00)
GFR, EST AFRICAN AMERICAN: 53 mL/min — AB (ref >60)
GFR, EST NON AFRICAN AMERICAN: 45 mL/min — AB (ref >60)
GLUCOSE: 148 mg/dL — AB (ref 65–99)
POTASSIUM: 4.1 mmol/L (ref 3.5–5.1)
Sodium: 137 mmol/L (ref 135–145)
TOTAL PROTEIN: 6.8 g/dL (ref 6.5–8.1)

## 2016-08-24 MED ORDER — IBRANCE 125 MG PO CAPS: 125.0000 mg | ORAL_CAPSULE | Freq: Every day | ORAL | 4 refills | Status: DC

## 2016-08-24 NOTE — Telephone Encounter (Signed)
Faith [name]- patient will need a refill of her ibrance. Thx

## 2016-08-24 NOTE — Telephone Encounter (Signed)
rx for ibrance refill sent to pt[facility]

## 2016-08-25 LAB — CANCER ANTIGEN 27.29: CAN 27.29: 17.1 U/mL (ref 0.0–38.6)

## 2016-09-06 ENCOUNTER — Ambulatory Visit (INDEPENDENT_AMBULATORY_CARE_PROVIDER_SITE_OTHER): Payer: Medicare Other | Admitting: General Surgery

## 2016-09-06 ENCOUNTER — Encounter: Payer: Self-pay | Admitting: General Surgery

## 2016-09-06 VITALS — BP 130/70 | HR 68 | Resp 14 | Ht 67.0 in | Wt 239.0 lb

## 2016-09-06 DIAGNOSIS — I890 Lymphedema, not elsewhere classified: Secondary | ICD-10-CM | POA: Diagnosis not present

## 2016-09-06 DIAGNOSIS — C50912 Malignant neoplasm of unspecified site of left female breast: Secondary | ICD-10-CM | POA: Diagnosis not present

## 2016-09-06 NOTE — Patient Instructions (Signed)
Patient to return as needed. The patient is aware to call back for any questions or concerns. 

## 2016-09-06 NOTE — Progress Notes (Addendum)
Patient ID: Faith [name], female   DOB: [birth year], 71 y.o.   MRN: [id number]  Chief Complaint  Patient presents with  . Breast Cancer    HPI Faith [name] is a 71 y.o. female here today for her follow up breast cancer. Patient states she is doing well. No new health issues except for some mild left knee pain. HPI  Past Medical History:  Diagnosis Date  . Cancer [facility]) April 2015   left breast  . Hypertension   . Myocardial infarction [facility]) 11/03/2015   [facility], stent placed    Past Surgical History:  Procedure Laterality Date  . BREAST SURGERY Bilateral 11/19/13   bilater mastectomy   . PORTACATH PLACEMENT  06-07-13  . SHOULDER OPEN ROTATOR CUFF REPAIR  2012  . WRIST SURGERY Left 1969   cyst    Family History  Problem Relation Age of Onset  . Adopted: Yes    Social History Social History  Substance Use Topics  . Smoking status: Former Smoker    Packs/day: 1.00    Years: 15.00    Types: Cigarettes    Quit date: 06/12/1985  . Smokeless tobacco: Never Used  . Alcohol use 0.0 oz/week    Allergies  Allergen Reactions  . Mushroom Extract Complex Hives and Swelling  . Shellfish Allergy Nausea And Vomiting    Current Outpatient Prescriptions  Medication Sig Dispense Refill  . acetaminophen (TYLENOL) 500 MG tablet Take 500 mg by mouth every 6 (six) hours as needed.    Marland Kitchen aspirin EC 81 MG tablet Take 81 mg by mouth daily.     Marland Kitchen atorvastatin (LIPITOR) 80 MG tablet Take 80 mg by mouth daily.     Marland Kitchen BRILINTA 90 MG TABS tablet Take 90 mg by mouth 2 (two) times daily.    . Calcium Carbonate-Vitamin D (CALCIUM + D PO) Take 1 tablet by mouth daily.    . Cholecalciferol (VITAMIN D PO) Take 5,000 Units by mouth daily.    Marland Kitchen HYDROcodone-acetaminophen (NORCO/VICODIN) 5-325 MG tablet 1 tab po qd prn 6 tablet 0  . IBRANCE 125 MG capsule Take 1 capsule (125 mg total) by mouth daily with breakfast. Take for 3 weeks, then 1 week off. 21 capsule 4  . letrozole (FEMARA) 2.5 MG tablet TAKE 1  TABLET BY MOUTH  DAILY 90 tablet 1  . losartan (COZAAR) 25 MG tablet Take 25 mg by mouth daily.    . metoprolol tartrate (LOPRESSOR) 25 MG tablet Take 25 mg by mouth 2 (two) times daily.     . nitroGLYCERIN (NITROSTAT) 0.4 MG SL tablet Place under the tongue.    . ondansetron (ZOFRAN-ODT) 8 MG disintegrating tablet Take by mouth every 8 (eight) hours as needed.      No current facility-administered medications for this visit.    Facility-Administered Medications Ordered in Other Visits  Medication Dose Route Frequency Provider Last Rate Last Dose  . sodium chloride 0.9 % injection 10 mL  10 mL Intravenous PRN [name], MD   10 mL at 09/12/14 1014    Review of Systems Review of Systems  Constitutional: Negative.   Respiratory: Negative.   Cardiovascular: Negative.     Blood pressure 130/70, pulse 68, resp. rate 14, height 5\' 7"  (1.702 m), weight 239 lb (108.4 kg).  Physical Exam Physical Exam  Constitutional: She is oriented to person, place, and time. She appears well-developed and well-nourished.  Eyes: Conjunctivae are normal. No scleral icterus.  Neck: Neck supple.  Cardiovascular: Normal  rate, regular rhythm and normal heart sounds.   Pulmonary/Chest:  Bilateral mastectomy sites are clean and well healed.   Lymphadenopathy:    She has no cervical adenopathy.    She has no axillary adenopathy.  Neurological: She is alert and oriented to person, place, and time.  Skin: Skin is warm and dry.  Very mild Lymphedema, unchanged since last visit.     Data Reviewed Prior notes reviewed  Recent CT chest/abdomen with NED CA 27-29 normal and stable. Assessment      CA left breast stage IV. ER/PR pos. Pt now on Letrazole and Ibrance- stable course.  Plan    Patient to return as needed. Continue follow-up with oncology. The patient is aware to call back for any questions or concerns.      HPI, Physical Exam, Assessment and Plan have been scribed under the direction and  in the presence of Mckinley Jewel, MD  Gaspar Cola, CMA I have completed the exam and reviewed the above documentation for accuracy and completeness.  I agree with the above.  Haematologist has been used and any errors in dictation or transcription are unintentional.  Seeplaputhur G. Jamal Collin, M.D., F.A.C.S.   Junie Panning G 09/06/2016, 2:01 PM

## 2016-11-02 ENCOUNTER — Encounter: Payer: Self-pay | Admitting: Internal Medicine

## 2016-11-02 NOTE — Telephone Encounter (Signed)
Oral Chemotherapy Pharmacist Encounter  Spoke with patient. She is correct there are no breast cancer funds currently available. She will have to enroll in manufacturer assistance. Patient will meet me tomorrow morning to fill out the application.   Darl Pikes, PharmD, BCPS Hematology/Oncology Clinical Pharmacist [facility] [phone]  11/02/2016 4:37 PM

## 2016-11-03 ENCOUNTER — Telehealth: Payer: Self-pay | Admitting: Pharmacist

## 2016-11-05 NOTE — Telephone Encounter (Signed)
Oral Chemotherapy Pharmacist Encounter  Met with Faith [name] in the lobby on 54/00 to complete application for [employer] Patient Assistance. Faxed application in on 86/76/19. [employer] today and the stated they have received Faith [name] application and they are processing it.   Will continue to follow status. If the apllication is not approved by next Wednesday we will proceed with one time fill using voucher from [place].  Thank you,  Darl Pikes, PharmD, BCPS Hematology/Oncology Clinical Pharmacist [facility] [phone]  11/05/2016 12:55 PM

## 2016-11-09 NOTE — Telephone Encounter (Signed)
Oral Oncology Patient Advocate Encounter  Patient still has [phone] in PAF money. ID: [id number] thru 03/01/2017.   [place] Patient Advocate [phone] 11/09/2016 2:27 PM

## 2016-11-09 NOTE — Telephone Encounter (Signed)
Oral Oncology Patient Advocate Encounter  Received notification from [place] Patient Assistance program that patient has been successfully enrolled into their program to receive Ibrance from the manufacturer at $0 out of pocket until 01/17/2017.   I called and spoke with patient.  She knows we will have to re-apply.   Patient knows to call the office with questions or concerns.  Oral Oncology Clinic will continue to follow.  [place] Patient Advocate [phone] 11/09/2016 10:19 AM

## 2016-11-10 NOTE — Telephone Encounter (Signed)
Oral Oncology Patient Advocate Encounter  Per [employer] Faith [name] was sent out to patient yesterday 11/09/2016.   [place] Patient Advocate [phone] 11/10/2016 2:11 PM

## 2016-11-23 ENCOUNTER — Inpatient Hospital Stay: Payer: Medicare Other

## 2016-11-23 ENCOUNTER — Inpatient Hospital Stay: Payer: Medicare Other | Attending: Internal Medicine | Admitting: Internal Medicine

## 2016-11-23 ENCOUNTER — Other Ambulatory Visit: Payer: Self-pay | Admitting: Internal Medicine

## 2016-11-23 VITALS — BP 150/83 | HR 69 | Temp 98.2°F | Resp 18 | Wt 238.1 lb

## 2016-11-23 DIAGNOSIS — I2510 Atherosclerotic heart disease of native coronary artery without angina pectoris: Secondary | ICD-10-CM | POA: Diagnosis not present

## 2016-11-23 DIAGNOSIS — C50812 Malignant neoplasm of overlapping sites of left female breast: Secondary | ICD-10-CM | POA: Insufficient documentation

## 2016-11-23 DIAGNOSIS — Z95818 Presence of other cardiac implants and grafts: Secondary | ICD-10-CM | POA: Insufficient documentation

## 2016-11-23 DIAGNOSIS — Z9221 Personal history of antineoplastic chemotherapy: Secondary | ICD-10-CM | POA: Insufficient documentation

## 2016-11-23 DIAGNOSIS — C773 Secondary and unspecified malignant neoplasm of axilla and upper limb lymph nodes: Secondary | ICD-10-CM | POA: Diagnosis not present

## 2016-11-23 DIAGNOSIS — Z7982 Long term (current) use of aspirin: Secondary | ICD-10-CM

## 2016-11-23 DIAGNOSIS — Z79811 Long term (current) use of aromatase inhibitors: Secondary | ICD-10-CM | POA: Insufficient documentation

## 2016-11-23 DIAGNOSIS — I129 Hypertensive chronic kidney disease with stage 1 through stage 4 chronic kidney disease, or unspecified chronic kidney disease: Secondary | ICD-10-CM

## 2016-11-23 DIAGNOSIS — I252 Old myocardial infarction: Secondary | ICD-10-CM | POA: Insufficient documentation

## 2016-11-23 DIAGNOSIS — N183 Chronic kidney disease, stage 3 (moderate): Secondary | ICD-10-CM | POA: Diagnosis not present

## 2016-11-23 DIAGNOSIS — Z79899 Other long term (current) drug therapy: Secondary | ICD-10-CM

## 2016-11-23 DIAGNOSIS — K5730 Diverticulosis of large intestine without perforation or abscess without bleeding: Secondary | ICD-10-CM | POA: Insufficient documentation

## 2016-11-23 DIAGNOSIS — Z170 Estrogen receptor positive status [ER+]: Principal | ICD-10-CM

## 2016-11-23 DIAGNOSIS — M1990 Unspecified osteoarthritis, unspecified site: Secondary | ICD-10-CM | POA: Diagnosis not present

## 2016-11-23 DIAGNOSIS — G8929 Other chronic pain: Secondary | ICD-10-CM | POA: Diagnosis not present

## 2016-11-23 DIAGNOSIS — Z9013 Acquired absence of bilateral breasts and nipples: Secondary | ICD-10-CM | POA: Diagnosis not present

## 2016-11-23 DIAGNOSIS — Z87891 Personal history of nicotine dependence: Secondary | ICD-10-CM | POA: Insufficient documentation

## 2016-11-23 LAB — CBC WITH DIFFERENTIAL/PLATELET
BASOS ABS: 0.1 10*3/uL (ref 0–0.1)
BASOS PCT: 3 %
EOS ABS: 0.1 10*3/uL (ref 0–0.7)
EOS PCT: 3 %
HCT: 34.8 % — ABNORMAL LOW (ref 35.0–47.0)
HEMOGLOBIN: 12 g/dL (ref 12.0–16.0)
Lymphocytes Relative: 32 %
Lymphs Abs: 0.8 10*3/uL — ABNORMAL LOW (ref 1.0–3.6)
MCH: 35.1 pg — AB (ref 26.0–34.0)
MCHC: 34.4 g/dL (ref 32.0–36.0)
MCV: 102.1 fL — ABNORMAL HIGH (ref 80.0–100.0)
Monocytes Absolute: 0.1 10*3/uL — ABNORMAL LOW (ref 0.2–0.9)
Monocytes Relative: 5 %
NEUTROS PCT: 57 %
Neutro Abs: 1.5 10*3/uL (ref 1.4–6.5)
PLATELETS: 333 10*3/uL (ref 150–440)
RBC: 3.4 MIL/uL — AB (ref 3.80–5.20)
RDW: 14.9 % — ABNORMAL HIGH (ref 11.5–14.5)
WBC: 2.6 10*3/uL — AB (ref 3.6–11.0)

## 2016-11-23 LAB — COMPREHENSIVE METABOLIC PANEL
ALK PHOS: 91 U/L (ref 38–126)
ALT: 27 U/L (ref 14–54)
AST: 29 U/L (ref 15–41)
Albumin: 3.7 g/dL (ref 3.5–5.0)
Anion gap: 8 (ref 5–15)
BILIRUBIN TOTAL: 0.7 mg/dL (ref 0.3–1.2)
BUN: 22 mg/dL — AB (ref 6–20)
CALCIUM: 10 mg/dL (ref 8.9–10.3)
CHLORIDE: 103 mmol/L (ref 101–111)
CO2: 28 mmol/L (ref 22–32)
CREATININE: 1.13 mg/dL — AB (ref 0.44–1.00)
GFR, EST AFRICAN AMERICAN: 54 mL/min — AB (ref >60)
GFR, EST NON AFRICAN AMERICAN: 47 mL/min — AB (ref >60)
Glucose, Bld: 114 mg/dL — ABNORMAL HIGH (ref 65–99)
Potassium: 4.3 mmol/L (ref 3.5–5.1)
Sodium: 139 mmol/L (ref 135–145)
TOTAL PROTEIN: 7.4 g/dL (ref 6.5–8.1)

## 2016-11-23 NOTE — Progress Notes (Signed)
[place] OFFICE PROGRESS NOTE  Patient Care Team: Patient, No Pcp Per as PCP - General (General Practice) [name], MD (Unknown Physician Specialty) [name], MD (General Surgery)  Cancer Staging No matching staging information was found for the patient.    Oncology History   1. 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  Biopsy from the lymph node in the left axilla positive for metastatic memory carcinoma Estrogen receptor positive Progesterone receptor +ve hER-2/neu receptors equivocal  by [facility]  IHC for HER-2/neu is 2+ Clinically staged asT4 D. N1 M0 tumor stage IV  locally advanced carcinoma.. 2. She was started on   [place] OF 2015. 3. Treatment was changed to Cytoxan and Adriamycinffrom August 31, 2013  4.patient has finished oral 3 cycles of chemotherapy with Cytoxan and Adriamycin on October 19, 2013. 5.Patient had a bilateral mastectomy in November of 2015. ypT4B ypN3 ypM1 STAGE iv DISEASE.. repeat HER-2/neu receptor by [facility] is still equivocal (November, 2015).  6.  Started on Nov 2015-  letrozole and IBRANCE ; PET may 2017- NED; NOV 1st 2017- NED   # # Acute MI- s/p stenting [[facility]]  # RML 4 mm nodule- 1st NOV 2017- resolved     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 female, estrogen receptor positive ([place])     INTERVAL HISTORY:  Faith [name] 71 y.o.  female pleasant patient above history of Stage IV breast cancer/ NED currently on Femara plus ibrance since November 2015 is here for follow-up.  Patient complains of chronic joint pains/arthritic pain. Otherwise denies any new onset of bone pain.  Patient is awaiting evaluation with orthopedics; feels that she might need to have surgery.   Otherwise denies any fevers or chills. No shortness of breath or chest pain. Denies any headaches. No nausea no  vomiting. No diarrhea. No fevers. Not losing any weight.  REVIEW OF SYSTEMS:  A complete 10 point review of system is done which is negative except mentioned above/history of present illness.   PAST MEDICAL HISTORY :  Past Medical History:  Diagnosis Date  . Cancer [facility]) April 2015   left breast  . Hypertension   . Myocardial infarction ([place]) 11/03/2015   [facility], stent placed    PAST SURGICAL HISTORY :   Past Surgical History:  Procedure Laterality Date  . BREAST SURGERY Bilateral 11/19/13   bilater mastectomy   . PORTACATH PLACEMENT  06-07-13  . SHOULDER OPEN ROTATOR CUFF REPAIR  2012  . WRIST SURGERY Left 1969   cyst    FAMILY HISTORY :   Family History  Adopted: Yes    SOCIAL HISTORY:   Social History   Tobacco Use  . Smoking status: Former Smoker    Packs/day: 1.00    Years: 15.00    Pack years: 15.00    Types: Cigarettes    Last attempt to quit: 06/12/1985    Years since quitting: 31.4  . Smokeless tobacco: Never Used  Substance Use Topics  . Alcohol use: Yes    Alcohol/week: 0.0 oz  . Drug use: No    ALLERGIES:  is allergic to mushroom extract complex and shellfish allergy.  MEDICATIONS:  Current Outpatient Medications  Medication Sig Dispense Refill  . acetaminophen (TYLENOL) 500 MG tablet Take 500 mg by mouth every 6 (six) hours as needed.    Marland Kitchen atorvastatin (LIPITOR) 80 MG  tablet TAKE 1 TABLET BY MOUTH  NIGHTLY    . Calcium Carbonate-Vitamin D (CALCIUM + D PO) Take 1 tablet by mouth daily.    . Cholecalciferol (VITAMIN D PO) Take 5,000 Units by mouth daily.    [name] 125 MG capsule Take 1 capsule (125 mg total) by mouth daily with breakfast. Take for 3 weeks, then 1 week off. 21 capsule 4  . letrozole (FEMARA) 2.5 MG tablet TAKE 1 TABLET BY MOUTH  DAILY 90 tablet 1  . losartan (COZAAR) 25 MG tablet Take 25 mg by mouth daily.    Marland Kitchen atorvastatin (LIPITOR) 80 MG tablet Take 80 mg by mouth daily.     Marland Kitchen BRILINTA 90 MG TABS tablet Take 90 mg by mouth 2  (two) times daily.    Marland Kitchen HYDROcodone-acetaminophen (NORCO/VICODIN) 5-325 MG tablet 1 tab po qd prn (Patient not taking: Reported on 11/23/2016) 6 tablet 0  . metoprolol tartrate (LOPRESSOR) 25 MG tablet Take 25 mg by mouth 2 (two) times daily.     . nitroGLYCERIN (NITROSTAT) 0.4 MG SL tablet Place under the tongue.    . ondansetron (ZOFRAN-ODT) 8 MG disintegrating tablet Take by mouth every 8 (eight) hours as needed.      No current facility-administered medications for this visit.    Facility-Administered Medications Ordered in Other Visits  Medication Dose Route Frequency Provider Last Rate Last Dose  . sodium chloride 0.9 % injection 10 mL  10 mL Intravenous PRN [name], MD   10 mL at 09/12/14 1014    PHYSICAL EXAMINATION: ECOG PERFORMANCE STATUS: 0 - Asymptomatic  BP (!) 150/83 (BP Location: Right Arm, Patient Position: Sitting)   Pulse 69   Temp 98.2 F (36.8 C) (Tympanic)   Resp 18   Wt 238 lb 1.6 oz (108 kg)   BMI 37.29 kg/m   Filed Weights   11/23/16 0833  Weight: 238 lb 1.6 oz (108 kg)    GENERAL: Well-nourished well-developed; Alert, no distress and comfortable.  Alone. EYES: no pallor or icterus OROPHARYNX: no thrush or ulceration; good dentition  NECK: supple, no masses felt LYMPH:  no palpable lymphadenopathy in the cervical, axillary or inguinal regions LUNGS: clear to auscultation and  No wheeze or crackles HEART/CVS: regular rate & rhythm and no murmurs; No lower extremity edema ABDOMEN:abdomen soft, non-tender and normal bowel sounds Musculoskeletal:no cyanosis of digits and no clubbing  PSYCH: alert & oriented x 3 with fluent speech NEURO: no focal motor/sensory deficits SKIN:  no rashes or significant lesions  LABORATORY DATA:  I have reviewed the data as listed    Component Value Date/Time   NA 139 11/23/2016 0802   NA 137 05/16/2014 1154   K 4.3 11/23/2016 0802   K 3.9 05/16/2014 1154   CL 103 11/23/2016 0802   CL 104 05/16/2014 1154   CO2  28 11/23/2016 0802   CO2 25 05/16/2014 1154   GLUCOSE 114 (H) 11/23/2016 0802   GLUCOSE 110 (H) 05/16/2014 1154   BUN 22 (H) 11/23/2016 0802   BUN 20 05/16/2014 1154   CREATININE 1.13 (H) 11/23/2016 0802   CREATININE 1.00 05/16/2014 1154   CALCIUM 10.0 11/23/2016 0802   CALCIUM 9.6 05/16/2014 1154   PROT 7.4 11/23/2016 0802   PROT 7.4 05/16/2014 1154   ALBUMIN 3.7 11/23/2016 0802   ALBUMIN 4.0 05/16/2014 1154   AST 29 11/23/2016 0802   AST 26 05/16/2014 1154   ALT 27 11/23/2016 0802   ALT 37 05/16/2014 1154  ALKPHOS 91 11/23/2016 0802   ALKPHOS 72 05/16/2014 1154   BILITOT 0.7 11/23/2016 0802   BILITOT 0.5 05/16/2014 1154   GFRNONAA 47 (L) 11/23/2016 0802   GFRNONAA 57 (L) 05/16/2014 1154   GFRAA 54 (L) 11/23/2016 0802   GFRAA >60 05/16/2014 1154    No results found for: SPEP, UPEP  Lab Results  Component Value Date   WBC 2.6 (L) 11/23/2016   NEUTROABS 1.5 11/23/2016   HGB 12.0 11/23/2016   HCT 34.8 (L) 11/23/2016   MCV 102.1 (H) 11/23/2016   PLT 333 11/23/2016      Chemistry      Component Value Date/Time   NA 139 11/23/2016 0802   NA 137 05/16/2014 1154   K 4.3 11/23/2016 0802   K 3.9 05/16/2014 1154   CL 103 11/23/2016 0802   CL 104 05/16/2014 1154   CO2 28 11/23/2016 0802   CO2 25 05/16/2014 1154   BUN 22 (H) 11/23/2016 0802   BUN 20 05/16/2014 1154   CREATININE 1.13 (H) 11/23/2016 0802   CREATININE 1.00 05/16/2014 1154      Component Value Date/Time   CALCIUM 10.0 11/23/2016 0802   CALCIUM 9.6 05/16/2014 1154   ALKPHOS 91 11/23/2016 0802   ALKPHOS 72 05/16/2014 1154   AST 29 11/23/2016 0802   AST 26 05/16/2014 1154   ALT 27 11/23/2016 0802   ALT 37 05/16/2014 1154   BILITOT 0.7 11/23/2016 0802   BILITOT 0.5 05/16/2014 1154     IMPRESSION: Chest Impression:  No evidence of thoracic metastasis.  Abdomen / Pelvis Impression:  1. No evidence of abdominal or pelvic metastasis. 2. No skeletal metastasis. 3. Colonic  diverticulosis.   Electronically Signed   By: Suzy Bouchard M.D.   On: 11/19/2015 11:40  RADIOGRAPHIC STUDIES: I have personally reviewed the radiological images as listed and agreed with the findings in the report. No results found.   ASSESSMENT & PLAN:  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 female, estrogen receptor positive ([place]) Breast cancer stage IV NED- currently on letrozole and ibrance [since 2015].  May 5th CT chest/A/P-NED.  Tolerating treatments well. Clinically no evidence of progression. Continue ibrance and letrozole.  # CAD s/p stenting [[facility]]- on anti-platelet therapy; no further events.  # Bil Arthritis knee- Tylenol prn; awaiting evaluation with Dr.Menz  # CKD- stage III- creat 1.12 stable.   # Follow-up in 3 months/CBC CMP CA 27-29; CT scan prior.    Orders Placed This Encounter  Procedures  . CT ABDOMEN PELVIS W CONTRAST    Standing Status:   Future    Standing Expiration Date:   11/23/2017    Order Specific Question:   If indicated for the ordered procedure, I authorize the administration of contrast media per Radiology protocol    Answer:   Yes    Order Specific Question:   Preferred imaging location?    Answer:   [facility] [place]    Order Specific Question:   Radiology Contrast Protocol - do NOT remove file path    Answer:   \\[email or web address].pdf  . CT CHEST W CONTRAST    Standing Status:   Future    Standing Expiration Date:   11/23/2017    Order Specific Question:   If indicated for the ordered procedure, I authorize the administration of contrast media per Radiology protocol    Answer:   Yes    Order Specific Question:   Preferred imaging location?    Answer:   [facility]  [place]    Order Specific Question:   Radiology Contrast Protocol - do NOT remove file path    Answer:   \\[email or web address].pdf  . CBC with Differential/Platelet    Standing Status:   Future    Standing Expiration Date:    11/23/2017  . Comprehensive metabolic panel    Standing Status:   Future    Standing Expiration Date:   11/23/2017  . Cancer antigen 27.29    Standing Status:   Future    Standing Expiration Date:   11/23/2017   All questions were answered. The patient knows to call the clinic with any problems, questions or concerns.     Cammie Sickle, MD 11/23/2016 9:26 AM

## 2016-11-23 NOTE — Progress Notes (Signed)
Here for follow up

## 2016-11-23 NOTE — Assessment & Plan Note (Signed)
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  May 5th CT chest/A/P-NED.  Tolerating treatments well. Clinically no evidence of progression. Continue ibrance and letrozole.  # CAD s/p stenting [[facility]]- on anti-platelet therapy; no further events.  # Bil Arthritis knee- Tylenol prn; awaiting evaluation with Dr.Menz  # CKD- stage III- creat 1.12 stable.   # Follow-up in 3 months/CBC CMP CA 27-29; CT scan prior.

## 2016-11-24 LAB — CANCER ANTIGEN 27.29: CAN 27.29: 17.2 U/mL (ref 0.0–38.6)

## 2016-12-01 ENCOUNTER — Telehealth: Payer: Self-pay | Admitting: Internal Medicine

## 2016-12-01 NOTE — Telephone Encounter (Signed)
Oral Oncology Patient Advocate Encounter  Sent re-enrollment for [place] for 2019.  Faxed [phone]   [place] Patient Advocate [phone] 12/01/2016 3:46 PM

## 2016-12-04 ENCOUNTER — Other Ambulatory Visit: Payer: Self-pay | Admitting: Internal Medicine

## 2016-12-04 DIAGNOSIS — C50912 Malignant neoplasm of unspecified site of left female breast: Secondary | ICD-10-CM

## 2017-01-20 ENCOUNTER — Telehealth: Payer: Self-pay | Admitting: Pharmacist

## 2017-01-20 DIAGNOSIS — C50812 Malignant neoplasm of overlapping sites of left female breast: Secondary | ICD-10-CM

## 2017-01-20 DIAGNOSIS — Z170 Estrogen receptor positive status [ER+]: Secondary | ICD-10-CM

## 2017-01-20 MED ORDER — IBRANCE 125 MG PO CAPS: 125.0000 mg | ORAL_CAPSULE | Freq: Every day | ORAL | 4 refills | Status: DC

## 2017-01-20 NOTE — Telephone Encounter (Signed)
Oral Oncology Pharmacist Encounter Refill prescription for Ibrance for the treatment of metastatic breast cancer, planned duration until disease progression or unacceptable drug toxicity. Medication started 11/2013.  CBC and CA 27.29 from 11/23/2016 assessed, no relevant lab abnormalities. Prescription dose and frequency assessed.   Current medication list in Epic reviewed, no relevent DDIs with Ibrance identified.  Prescription has been e-scribed to the [facility] and we will see if the insurance company will let it be filled there.  Oral Oncology Clinic will continue to follow patient.   Darl Pikes, PharmD, BCPS Hematology/Oncology Clinical Pharmacist [facility] [place] [facility] [phone]  01/20/2017 3:52 PM

## 2017-01-20 NOTE — Telephone Encounter (Signed)
Oral Chemotherapy Pharmacist Encounter  Foundation assistance opened up to cover the copay of Ms. Dragon' Ibrance. A prescription for her [name] will now be sent to [facility].  Darl Pikes, PharmD, BCPS Hematology/Oncology Clinical Pharmacist [facility] [phone]  01/20/2017 3:43 PM

## 2017-01-21 ENCOUNTER — Telehealth: Payer: Self-pay | Admitting: Internal Medicine

## 2017-01-21 NOTE — Telephone Encounter (Addendum)
Oral Oncology Patient Advocate Encounter  Was successful in securing patient a year grant from [employer] to provide copayment coverage for her Ibrance. This will keep the out of pocket expense at $0.    I have spoken with the patient.    The billing information is as follows and has been shared with [place].   Member ID: [id number] Group ID: [id number] RxBin: [id number] Dates of Eligibility: 01/21/2017 through 01/21/2018   Co-pay $0165.80   [place] Patient Advocate [phone] 01/21/2017 5:03 PM

## 2017-01-24 MED FILL — IBRANCE 125 MG CAPSULE: 125 | 21 days supply | Qty: 21 | Fill #0

## 2017-01-24 NOTE — Telephone Encounter (Signed)
Oral Oncology Patient Advocate Encounter  E-mailed [facility] to please mail patients Faith [name] to her [name].    [name] Patient Advocate [phone] 01/24/2017 10:21 AM

## 2017-02-04 ENCOUNTER — Telehealth: Payer: Self-pay | Admitting: Internal Medicine

## 2017-02-04 NOTE — Telephone Encounter (Signed)
Oral Oncology Patient Advocate Encounter  Received notification from [place] Patient Assistance program that patient has been successfully enrolled into their program to receive Ibrance from the manufacturer at $0 out of pocket until 01/17/2018.   I called and spoke with patient.  She knows we will have to re-apply.   Patient knows to call the office with questions or concerns.  Oral Oncology Clinic will continue to follow.  [place] Patient Advocate [phone] 02/04/2017 1:52 PM

## 2017-02-21 ENCOUNTER — Ambulatory Visit: Admission: RE | Admit: 2017-02-21 | Payer: Medicare Other | Source: Ambulatory Visit

## 2017-02-21 ENCOUNTER — Ambulatory Visit
Admission: RE | Admit: 2017-02-21 | Discharge: 2017-02-21 | Disposition: A | Discharge status: Home / Self Care | Payer: Medicare Other | Source: Ambulatory Visit | Attending: Internal Medicine | Admitting: Internal Medicine

## 2017-02-21 ENCOUNTER — Inpatient Hospital Stay: Payer: Medicare Other | Attending: Internal Medicine

## 2017-02-21 DIAGNOSIS — G8929 Other chronic pain: Secondary | ICD-10-CM | POA: Insufficient documentation

## 2017-02-21 DIAGNOSIS — M170 Bilateral primary osteoarthritis of knee: Secondary | ICD-10-CM | POA: Insufficient documentation

## 2017-02-21 DIAGNOSIS — Z170 Estrogen receptor positive status [ER+]: Secondary | ICD-10-CM | POA: Insufficient documentation

## 2017-02-21 DIAGNOSIS — N183 Chronic kidney disease, stage 3 (moderate): Secondary | ICD-10-CM | POA: Insufficient documentation

## 2017-02-21 DIAGNOSIS — I2510 Atherosclerotic heart disease of native coronary artery without angina pectoris: Secondary | ICD-10-CM | POA: Insufficient documentation

## 2017-02-21 DIAGNOSIS — Z79811 Long term (current) use of aromatase inhibitors: Secondary | ICD-10-CM | POA: Insufficient documentation

## 2017-02-21 DIAGNOSIS — C773 Secondary and unspecified malignant neoplasm of axilla and upper limb lymph nodes: Secondary | ICD-10-CM | POA: Diagnosis not present

## 2017-02-21 DIAGNOSIS — Z95818 Presence of other cardiac implants and grafts: Secondary | ICD-10-CM | POA: Diagnosis not present

## 2017-02-21 DIAGNOSIS — Z9013 Acquired absence of bilateral breasts and nipples: Secondary | ICD-10-CM | POA: Insufficient documentation

## 2017-02-21 DIAGNOSIS — Z9221 Personal history of antineoplastic chemotherapy: Secondary | ICD-10-CM | POA: Insufficient documentation

## 2017-02-21 DIAGNOSIS — I252 Old myocardial infarction: Secondary | ICD-10-CM | POA: Insufficient documentation

## 2017-02-21 DIAGNOSIS — Z7982 Long term (current) use of aspirin: Secondary | ICD-10-CM | POA: Insufficient documentation

## 2017-02-21 DIAGNOSIS — K760 Fatty (change of) liver, not elsewhere classified: Secondary | ICD-10-CM | POA: Insufficient documentation

## 2017-02-21 DIAGNOSIS — Z87891 Personal history of nicotine dependence: Secondary | ICD-10-CM | POA: Insufficient documentation

## 2017-02-21 DIAGNOSIS — C50812 Malignant neoplasm of overlapping sites of left female breast: Secondary | ICD-10-CM | POA: Insufficient documentation

## 2017-02-21 DIAGNOSIS — K5730 Diverticulosis of large intestine without perforation or abscess without bleeding: Secondary | ICD-10-CM | POA: Insufficient documentation

## 2017-02-21 DIAGNOSIS — Z79899 Other long term (current) drug therapy: Secondary | ICD-10-CM | POA: Insufficient documentation

## 2017-02-21 DIAGNOSIS — I129 Hypertensive chronic kidney disease with stage 1 through stage 4 chronic kidney disease, or unspecified chronic kidney disease: Secondary | ICD-10-CM | POA: Diagnosis not present

## 2017-02-21 LAB — COMPREHENSIVE METABOLIC PANEL
ALBUMIN: 4.2 g/dL (ref 3.5–5.0)
ALT: 31 U/L (ref 14–54)
AST: 35 U/L (ref 15–41)
Alkaline Phosphatase: 82 U/L (ref 38–126)
Anion gap: 8 (ref 5–15)
BUN: 21 mg/dL — AB (ref 6–20)
CHLORIDE: 100 mmol/L — AB (ref 101–111)
CO2: 29 mmol/L (ref 22–32)
CREATININE: 1.12 mg/dL — AB (ref 0.44–1.00)
Calcium: 9.7 mg/dL (ref 8.9–10.3)
GFR calc non Af Amer: 48 mL/min — ABNORMAL LOW (ref >60)
GFR, EST AFRICAN AMERICAN: 55 mL/min — AB (ref >60)
GLUCOSE: 114 mg/dL — AB (ref 65–99)
Potassium: 4.2 mmol/L (ref 3.5–5.1)
SODIUM: 137 mmol/L (ref 135–145)
Total Bilirubin: 0.6 mg/dL (ref 0.3–1.2)
Total Protein: 7.5 g/dL (ref 6.5–8.1)

## 2017-02-21 LAB — CBC WITH DIFFERENTIAL/PLATELET
Basophils Absolute: 0 10*3/uL (ref 0–0.1)
Basophils Relative: 1 %
EOS ABS: 0.1 10*3/uL (ref 0–0.7)
Eosinophils Relative: 3 %
HCT: 35.7 % (ref 35.0–47.0)
HEMOGLOBIN: 12.6 g/dL (ref 12.0–16.0)
LYMPHS ABS: 0.8 10*3/uL — AB (ref 1.0–3.6)
Lymphocytes Relative: 29 %
MCH: 36.2 pg — AB (ref 26.0–34.0)
MCHC: 35.4 g/dL (ref 32.0–36.0)
MCV: 102.5 fL — ABNORMAL HIGH (ref 80.0–100.0)
MONOS PCT: 6 %
Monocytes Absolute: 0.2 10*3/uL (ref 0.2–0.9)
NEUTROS PCT: 61 %
Neutro Abs: 1.6 10*3/uL (ref 1.4–6.5)
Platelets: 249 10*3/uL (ref 150–440)
RBC: 3.49 MIL/uL — ABNORMAL LOW (ref 3.80–5.20)
RDW: 15.3 % — ABNORMAL HIGH (ref 11.5–14.5)
WBC: 2.6 10*3/uL — ABNORMAL LOW (ref 3.6–11.0)

## 2017-02-21 MED ORDER — IOPAMIDOL (ISOVUE-300) INJECTION 61%
75.0000 mL | Freq: Once | INTRAVENOUS | Status: AC | PRN
  Administered 2017-02-21: 75 mL via INTRAVENOUS

## 2017-02-22 ENCOUNTER — Encounter: Payer: Self-pay | Admitting: Internal Medicine

## 2017-02-22 ENCOUNTER — Inpatient Hospital Stay (HOSPITAL_BASED_OUTPATIENT_CLINIC_OR_DEPARTMENT_OTHER): Payer: Medicare Other | Admitting: Internal Medicine

## 2017-02-22 ENCOUNTER — Other Ambulatory Visit: Payer: Medicare Other

## 2017-02-22 VITALS — BP 145/72 | HR 80 | Temp 97.4°F | Resp 16 | Wt 241.6 lb

## 2017-02-22 DIAGNOSIS — C50812 Malignant neoplasm of overlapping sites of left female breast: Secondary | ICD-10-CM

## 2017-02-22 DIAGNOSIS — I2510 Atherosclerotic heart disease of native coronary artery without angina pectoris: Secondary | ICD-10-CM

## 2017-02-22 DIAGNOSIS — I252 Old myocardial infarction: Secondary | ICD-10-CM

## 2017-02-22 DIAGNOSIS — Z95818 Presence of other cardiac implants and grafts: Secondary | ICD-10-CM

## 2017-02-22 DIAGNOSIS — Z9221 Personal history of antineoplastic chemotherapy: Secondary | ICD-10-CM

## 2017-02-22 DIAGNOSIS — Z79899 Other long term (current) drug therapy: Secondary | ICD-10-CM

## 2017-02-22 DIAGNOSIS — C773 Secondary and unspecified malignant neoplasm of axilla and upper limb lymph nodes: Secondary | ICD-10-CM | POA: Diagnosis not present

## 2017-02-22 DIAGNOSIS — Z7982 Long term (current) use of aspirin: Secondary | ICD-10-CM

## 2017-02-22 DIAGNOSIS — Z9013 Acquired absence of bilateral breasts and nipples: Secondary | ICD-10-CM

## 2017-02-22 DIAGNOSIS — G8929 Other chronic pain: Secondary | ICD-10-CM | POA: Diagnosis not present

## 2017-02-22 DIAGNOSIS — Z79811 Long term (current) use of aromatase inhibitors: Secondary | ICD-10-CM

## 2017-02-22 DIAGNOSIS — Z170 Estrogen receptor positive status [ER+]: Secondary | ICD-10-CM | POA: Diagnosis not present

## 2017-02-22 DIAGNOSIS — M170 Bilateral primary osteoarthritis of knee: Secondary | ICD-10-CM

## 2017-02-22 DIAGNOSIS — Z87891 Personal history of nicotine dependence: Secondary | ICD-10-CM

## 2017-02-22 DIAGNOSIS — K5730 Diverticulosis of large intestine without perforation or abscess without bleeding: Secondary | ICD-10-CM

## 2017-02-22 DIAGNOSIS — N183 Chronic kidney disease, stage 3 (moderate): Secondary | ICD-10-CM | POA: Diagnosis not present

## 2017-02-22 DIAGNOSIS — I129 Hypertensive chronic kidney disease with stage 1 through stage 4 chronic kidney disease, or unspecified chronic kidney disease: Secondary | ICD-10-CM

## 2017-02-22 LAB — CANCER ANTIGEN 27.29: CA 27.29: 12.6 U/mL (ref 0.0–38.6)

## 2017-02-22 NOTE — Progress Notes (Signed)
[facility] OFFICE PROGRESS NOTE  Patient Care Team: Patient, No Pcp Per as PCP - General (General Practice) [name], MD (Unknown Physician Specialty) [name], MD (General Surgery)  Cancer Staging No matching staging information was found for the patient.    Oncology History   1. 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  Biopsy from the lymph node in the left axilla positive for metastatic memory carcinoma Estrogen receptor positive Progesterone receptor +ve hER-2/neu receptors equivocal  by Fish  IHC for HER-2/neu is 2+ Clinically staged asT4 D. N1 M0 tumor stage IV  locally advanced carcinoma.. 2. She was started on   [place] OF 2015. 3. Treatment was changed to Cytoxan and Adriamycinffrom August 31, 2013  4.patient has finished oral 3 cycles of chemotherapy with Cytoxan and Adriamycin on October 19, 2013. 5.Patient had a bilateral mastectomy in November of 2015. ypT4B ypN3 ypM1 STAGE iv DISEASE.. repeat HER-2/neu receptor by [facility] is still equivocal (November, 2015).  6.  Started on Nov 2015-  letrozole and IBRANCE ; PET may 2017- NED; NOV 1st 2017- NED   # # Acute MI- s/p stenting [[facility]]  # RML 4 mm nodule- 1st NOV 2017- resolved     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 female, estrogen receptor positive ([place])     INTERVAL HISTORY:  Faith [name] 72 y.o.  female pleasant patient above history of Stage IV breast cancer/ NED currently on Femara plus ibrance since November 2015 is here for follow-up/review the results of the CT scan.  Patient denies any shortness of breath or cough.  Denies any nausea vomiting diarrhea.  No fever no chills. Denies any headaches.  No fevers. Not losing any weight.  She has been recently evaluated by cardiology; stable.  She also is being followed by orthopedics awaiting to have  steroid injection to the knees.  REVIEW OF SYSTEMS:  A complete 10 point review of system is done which is negative except mentioned above/history of present illness.   PAST MEDICAL HISTORY :  Past Medical History:  Diagnosis Date  . Cancer [facility]) April 2015   left breast  . Hypertension   . Myocardial infarction ([place]) 11/03/2015   [facility], stent placed    PAST SURGICAL HISTORY :   Past Surgical History:  Procedure Laterality Date  . BREAST SURGERY Bilateral 11/19/13   bilater mastectomy   . PORTACATH PLACEMENT  06-07-13  . SHOULDER OPEN ROTATOR CUFF REPAIR  2012  . WRIST SURGERY Left 1969   cyst    FAMILY HISTORY :   Family History  Adopted: Yes    SOCIAL HISTORY:   Social History   Tobacco Use  . Smoking status: Former Smoker    Packs/day: 1.00    Years: 15.00    Pack years: 15.00    Types: Cigarettes    Last attempt to quit: 06/12/1985    Years since quitting: 31.7  . Smokeless tobacco: Never Used  Substance Use Topics  . Alcohol use: Yes    Alcohol/week: 0.0 oz  . Drug use: No    ALLERGIES:  is allergic to mushroom extract complex and shellfish allergy.  MEDICATIONS:  Current Outpatient Medications  Medication Sig Dispense Refill  . acetaminophen (TYLENOL) 500 MG tablet Take 500 mg by mouth every 6 (six) hours as needed.    Marland Kitchen atorvastatin (LIPITOR) 80 MG  tablet TAKE 1 TABLET BY MOUTH  NIGHTLY    . BRILINTA 90 MG TABS tablet Take 90 mg by mouth 2 (two) times daily.    . Calcium Carbonate-Vitamin D (CALCIUM + D PO) Take 1 tablet by mouth daily.    . Cholecalciferol (VITAMIN D PO) Take 5,000 Units by mouth daily.    Marland Kitchen HYDROcodone-acetaminophen (NORCO/VICODIN) 5-325 MG tablet 1 tab po qd prn 6 tablet 0  . IBRANCE 125 MG capsule Take 1 capsule (125 mg total) by mouth daily with breakfast. Take for 3 weeks, then 1 week off. 21 capsule 4  . letrozole (FEMARA) 2.5 MG tablet TAKE 1 TABLET BY MOUTH  DAILY 90 tablet 1  . losartan (COZAAR) 25 MG tablet Take 25 mg by  mouth daily.    . ondansetron (ZOFRAN-ODT) 8 MG disintegrating tablet Take by mouth every 8 (eight) hours as needed.     Marland Kitchen atorvastatin (LIPITOR) 80 MG tablet Take 80 mg by mouth daily.     . metoprolol tartrate (LOPRESSOR) 25 MG tablet Take 25 mg by mouth 2 (two) times daily.     . nitroGLYCERIN (NITROSTAT) 0.4 MG SL tablet Place under the tongue.     No current facility-administered medications for this visit.    Facility-Administered Medications Ordered in Other Visits  Medication Dose Route Frequency Provider Last Rate Last Dose  . sodium chloride 0.9 % injection 10 mL  10 mL Intravenous PRN [name], MD   10 mL at 09/12/14 1014    PHYSICAL EXAMINATION: ECOG PERFORMANCE STATUS: 0 - Asymptomatic  BP (!) 145/72 (BP Location: Left Arm, Patient Position: Sitting)   Pulse 80   Temp (!) 97.4 F (36.3 C) (Tympanic)   Resp 16   Wt 241 lb 10 oz (109.6 kg)   BMI 37.84 kg/m   Filed Weights   02/22/17 0828  Weight: 241 lb 10 oz (109.6 kg)    GENERAL: Well-nourished well-developed; Alert, no distress and comfortable.  Alone. EYES: no pallor or icterus OROPHARYNX: no thrush or ulceration; good dentition  NECK: supple, no masses felt LYMPH:  no palpable lymphadenopathy in the cervical, axillary or inguinal regions LUNGS: clear to auscultation and  No wheeze or crackles HEART/CVS: regular rate & rhythm and no murmurs; No lower extremity edema ABDOMEN:abdomen soft, non-tender and normal bowel sounds Musculoskeletal:no cyanosis of digits and no clubbing  PSYCH: alert & oriented x 3 with fluent speech NEURO: no focal motor/sensory deficits SKIN:  no rashes or significant lesions; bilateral mastectomy sites no evidence of local recurrent disease.  LABORATORY DATA:  I have reviewed the data as listed    Component Value Date/Time   NA 137 02/21/2017 0918   NA 137 05/16/2014 1154   K 4.2 02/21/2017 0918   K 3.9 05/16/2014 1154   CL 100 (L) 02/21/2017 0918   CL 104 05/16/2014  1154   CO2 29 02/21/2017 0918   CO2 25 05/16/2014 1154   GLUCOSE 114 (H) 02/21/2017 0918   GLUCOSE 110 (H) 05/16/2014 1154   BUN 21 (H) 02/21/2017 0918   BUN 20 05/16/2014 1154   CREATININE 1.12 (H) 02/21/2017 0918   CREATININE 1.00 05/16/2014 1154   CALCIUM 9.7 02/21/2017 0918   CALCIUM 9.6 05/16/2014 1154   PROT 7.5 02/21/2017 0918   PROT 7.4 05/16/2014 1154   ALBUMIN 4.2 02/21/2017 0918   ALBUMIN 4.0 05/16/2014 1154   AST 35 02/21/2017 0918   AST 26 05/16/2014 1154   ALT 31 02/21/2017 0918   ALT  37 05/16/2014 1154   ALKPHOS 82 02/21/2017 0918   ALKPHOS 72 05/16/2014 1154   BILITOT 0.6 02/21/2017 0918   BILITOT 0.5 05/16/2014 1154   GFRNONAA 48 (L) 02/21/2017 0918   GFRNONAA 57 (L) 05/16/2014 1154   GFRAA 55 (L) 02/21/2017 0918   GFRAA >60 05/16/2014 1154    No results found for: SPEP, UPEP  Lab Results  Component Value Date   WBC 2.6 (L) 02/21/2017   NEUTROABS 1.6 02/21/2017   HGB 12.6 02/21/2017   HCT 35.7 02/21/2017   MCV 102.5 (H) 02/21/2017   PLT 249 02/21/2017      Chemistry      Component Value Date/Time   NA 137 02/21/2017 0918   NA 137 05/16/2014 1154   K 4.2 02/21/2017 0918   K 3.9 05/16/2014 1154   CL 100 (L) 02/21/2017 0918   CL 104 05/16/2014 1154   CO2 29 02/21/2017 0918   CO2 25 05/16/2014 1154   BUN 21 (H) 02/21/2017 0918   BUN 20 05/16/2014 1154   CREATININE 1.12 (H) 02/21/2017 0918   CREATININE 1.00 05/16/2014 1154      Component Value Date/Time   CALCIUM 9.7 02/21/2017 0918   CALCIUM 9.6 05/16/2014 1154   ALKPHOS 82 02/21/2017 0918   ALKPHOS 72 05/16/2014 1154   AST 35 02/21/2017 0918   AST 26 05/16/2014 1154   ALT 31 02/21/2017 0918   ALT 37 05/16/2014 1154   BILITOT 0.6 02/21/2017 0918   BILITOT 0.5 05/16/2014 1154     IMPRESSION: Chest Impression:  No evidence of thoracic metastasis.  Abdomen / Pelvis Impression:  1. No evidence of abdominal or pelvic metastasis. 2. No skeletal metastasis. 3. Colonic  diverticulosis.   Electronically Signed   By: Suzy Bouchard M.D.   On: 11/19/2015 11:40  RADIOGRAPHIC STUDIES: I have personally reviewed the radiological images as listed and agreed with the findings in the report. Ct Chest W Contrast  Result Date: 02/21/2017 CLINICAL DATA:  Restaging left breast cancer EXAM: CT CHEST, ABDOMEN, AND PELVIS WITH CONTRAST TECHNIQUE: Multidetector CT imaging of the chest, abdomen and pelvis was performed following the standard protocol during bolus administration of intravenous contrast. CONTRAST:  61m ISOVUE-300 IOPAMIDOL (ISOVUE-300) INJECTION 61% COMPARISON:  05/21/2016 and 11/19/2015 FINDINGS: CT CHEST FINDINGS Cardiovascular: The heart is normal in size and stable. No pericardial effusion. Mild tortuosity and moderate calcification of the thoracic aorta but no aneurysm or dissection. The branch vessels are patent. Stable coronary artery calcifications. Mediastinum/Nodes: No mediastinal or hilar mass or lymphadenopathy. The esophagus is grossly normal. There is a small hiatal hernia. Lungs/Pleura: The lungs are clear of an acute process. No worrisome pulmonary nodules to suggest pulmonary metastatic disease. No pleural effusion or pleural nodules. Musculoskeletal: Surgical changes from bilateral mastectomies. No recurrent chest wall mass, supraclavicular or axillary lymphadenopathy. Small thyroid nodules are stable. The bony thorax is intact. No lytic or sclerotic bone lesions to suggest metastasis. CT ABDOMEN PELVIS FINDINGS Hepatobiliary: No focal hepatic lesions or intrahepatic biliary dilatation. Diffuse fatty infiltration is noted. The gallbladder is normal. No common bile duct dilatation. Pancreas: No mass, inflammation or ductal dilatation. Spleen: Normal size.  No focal lesions. Adrenals/Urinary Tract: No adrenal gland lesions are identified. Both kidneys are unremarkable and stable. Lower pole right renal cyst is unchanged. No bladder abnormality.  Stomach/Bowel: The stomach, duodenum, small bowel and colon are unremarkable. No acute inflammatory process, mass lesions or obstructive findings. The terminal ileum is normal. Diffuse colonic diverticulosis without findings for  acute diverticulitis. Vascular/Lymphatic: Stable advanced atherosclerotic calcifications involving the aorta but no aneurysm or dissection. The branch vessels are patent. The major venous structures are patent. Small scattered mesenteric and retroperitoneal lymph nodes but no mass or adenopathy. Reproductive: Fibroid uterus is noted with numerous calcifications. Both ovaries are still present and appear normal. Other: No pelvic mass or free pelvic fluid collections. No inguinal adenopathy. Inguinal hernias containing fat. Periumbilical abdominal wall hernia containing fat. Musculoskeletal: No significant bony findings. IMPRESSION: 1. Stable CT appearance of the chest, abdomen and pelvis. No findings suspicious for recurrent or metastatic disease. 2. Diffuse fatty infiltration of the liver. 3. Status post bilateral mastectomies. No findings to suggest recurrent chest wall tumor or axillary adenopathy. Electronically Signed   By: Marijo Sanes M.D.   On: 02/21/2017 12:50   Ct Abdomen Pelvis W Contrast  Result Date: 02/21/2017 CLINICAL DATA:  Restaging left breast cancer EXAM: CT CHEST, ABDOMEN, AND PELVIS WITH CONTRAST TECHNIQUE: Multidetector CT imaging of the chest, abdomen and pelvis was performed following the standard protocol during bolus administration of intravenous contrast. CONTRAST:  25m ISOVUE-300 IOPAMIDOL (ISOVUE-300) INJECTION 61% COMPARISON:  05/21/2016 and 11/19/2015 FINDINGS: CT CHEST FINDINGS Cardiovascular: The heart is normal in size and stable. No pericardial effusion. Mild tortuosity and moderate calcification of the thoracic aorta but no aneurysm or dissection. The branch vessels are patent. Stable coronary artery calcifications. Mediastinum/Nodes: No mediastinal or  hilar mass or lymphadenopathy. The esophagus is grossly normal. There is a small hiatal hernia. Lungs/Pleura: The lungs are clear of an acute process. No worrisome pulmonary nodules to suggest pulmonary metastatic disease. No pleural effusion or pleural nodules. Musculoskeletal: Surgical changes from bilateral mastectomies. No recurrent chest wall mass, supraclavicular or axillary lymphadenopathy. Small thyroid nodules are stable. The bony thorax is intact. No lytic or sclerotic bone lesions to suggest metastasis. CT ABDOMEN PELVIS FINDINGS Hepatobiliary: No focal hepatic lesions or intrahepatic biliary dilatation. Diffuse fatty infiltration is noted. The gallbladder is normal. No common bile duct dilatation. Pancreas: No mass, inflammation or ductal dilatation. Spleen: Normal size.  No focal lesions. Adrenals/Urinary Tract: No adrenal gland lesions are identified. Both kidneys are unremarkable and stable. Lower pole right renal cyst is unchanged. No bladder abnormality. Stomach/Bowel: The stomach, duodenum, small bowel and colon are unremarkable. No acute inflammatory process, mass lesions or obstructive findings. The terminal ileum is normal. Diffuse colonic diverticulosis without findings for acute diverticulitis. Vascular/Lymphatic: Stable advanced atherosclerotic calcifications involving the aorta but no aneurysm or dissection. The branch vessels are patent. The major venous structures are patent. Small scattered mesenteric and retroperitoneal lymph nodes but no mass or adenopathy. Reproductive: Fibroid uterus is noted with numerous calcifications. Both ovaries are still present and appear normal. Other: No pelvic mass or free pelvic fluid collections. No inguinal adenopathy. Inguinal hernias containing fat. Periumbilical abdominal wall hernia containing fat. Musculoskeletal: No significant bony findings. IMPRESSION: 1. Stable CT appearance of the chest, abdomen and pelvis. No findings suspicious for recurrent  or metastatic disease. 2. Diffuse fatty infiltration of the liver. 3. Status post bilateral mastectomies. No findings to suggest recurrent chest wall tumor or axillary adenopathy. Electronically Signed   By: PMarijo SanesM.D.   On: 02/21/2017 12:50     ASSESSMENT & PLAN:  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 female, estrogen receptor positive [place] cancer stage IV NED- currently on letrozole and ibrance [since 2015].  JAN 2019- CT chest/A/P-NED.  Tolerating treatments well. Clinically no evidence of progression. Continue ibrance and letrozole.  #  CAD s/p stenting [facility]; Dr.Fath]- on anti-platelet therapy; no further events.  # Bil Arthritis knee- awaiting steroids injections; with Dr. Rudene Christians.   # CKD- stage III- creat 1.12 stable.   # Follow-up in 3 months/CBC CMP CA 27-29.   # I reviewed the blood work- with the patient in detail; also reviewed the imaging independently [as summarized above]; and with the patient in detail.    Orders Placed This Encounter  Procedures  . CBC with Differential/Platelet    Standing Status:   Future    Standing Expiration Date:   02/22/2018  . Comprehensive metabolic panel    Standing Status:   Future    Standing Expiration Date:   02/22/2018  . Cancer antigen 27.29    Standing Status:   Future    Standing Expiration Date:   02/22/2018   All questions were answered. The patient knows to call the clinic with any problems, questions or concerns.     Cammie Sickle, MD 02/22/2017 10:06 AM

## 2017-02-22 NOTE — Assessment & Plan Note (Addendum)
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  JAN 2019- CT chest/A/P-NED.  Tolerating treatments well. Clinically no evidence of progression. Continue ibrance and letrozole.  # CAD s/p stenting [facility]; Dr.Fath]- on anti-platelet therapy; no further events.  # Bil Arthritis knee- awaiting steroids injections; with Dr. Rudene Christians.   # CKD- stage III- creat 1.12 stable.   # Follow-up in 3 months/CBC CMP CA 27-29.   # I reviewed the blood work- with the patient in detail; also reviewed the imaging independently [as summarized above]; and with the patient in detail.

## 2017-03-03 MED FILL — IBRANCE 125 MG CAPSULE: 125 | 28 days supply | Qty: 21 | Fill #1

## 2017-03-21 ENCOUNTER — Encounter: Payer: Self-pay | Admitting: Internal Medicine

## 2017-03-21 ENCOUNTER — Telehealth: Payer: Self-pay | Admitting: Internal Medicine

## 2017-03-21 NOTE — Telephone Encounter (Signed)
Oral Oncology Patient Advocate Encounter  Patient sent note thru my chart asking about her grants and did we need two grants and [employer]. I explain if they are not used with in so many days they will no longer be open. [employer] came in after we had gotten grants and approved her. She wanted to make sure she was not taking someone else's help.    [place] Patient Advocate [phone] 03/21/2017 1:48 PM

## 2017-03-28 MED FILL — IBRANCE 125 MG CAPSULE: 125 | 28 days supply | Qty: 21 | Fill #2

## 2017-04-13 ENCOUNTER — Telehealth: Payer: Self-pay | Admitting: Pharmacist

## 2017-04-13 NOTE — Telephone Encounter (Signed)
Oral Chemotherapy Pharmacist Encounter   Attempted to reach patient for follow up on oral medication: Ibrance (palbociclib). No answer. Left VM for patient to call back.    Darl Pikes, PharmD, BCPS Hematology/Oncology Clinical Pharmacist [facility] [phone]  04/13/2017 3:25 PM

## 2017-04-14 NOTE — Telephone Encounter (Signed)
Oral Chemotherapy Pharmacist Encounter  Follow-Up Form  Called patient today to follow up regarding patient's oral chemotherapy medication: Ibrance (palbociclib)  Original Start date of oral chemotherapy: 11/2013  Pt reports 0 tablets/doses of Ibrance (palbociclib) missed in the last cycle. She is currently about midway through her current cycle.   Pt reports the following side effects: none, reported  Recent labs reviewed: CA 27.29 from 02/21/17  New medications?: none, reported  Other Issues: none, reported  Patient knows to call the office with questions or concerns. Oral Oncology Clinic will continue to follow.  Darl Pikes, PharmD, BCPS Hematology/Oncology Clinical Pharmacist [facility] [phone]  04/14/2017 8:57 AM

## 2017-04-25 MED FILL — IBRANCE 125 MG CAPSULE: 125 | 28 days supply | Qty: 21 | Fill #3

## 2017-05-23 MED FILL — IBRANCE 125 MG CAPSULE: 125 | 28 days supply | Qty: 21 | Fill #4

## 2017-05-24 ENCOUNTER — Inpatient Hospital Stay: Payer: Medicare Other

## 2017-05-24 ENCOUNTER — Encounter: Payer: Self-pay | Admitting: Internal Medicine

## 2017-05-24 ENCOUNTER — Inpatient Hospital Stay: Payer: Medicare Other | Attending: Internal Medicine | Admitting: Internal Medicine

## 2017-05-24 VITALS — BP 138/72 | HR 70 | Temp 97.1°F | Resp 16 | Wt 242.4 lb

## 2017-05-24 DIAGNOSIS — D709 Neutropenia, unspecified: Secondary | ICD-10-CM | POA: Insufficient documentation

## 2017-05-24 DIAGNOSIS — Z9013 Acquired absence of bilateral breasts and nipples: Secondary | ICD-10-CM | POA: Insufficient documentation

## 2017-05-24 DIAGNOSIS — Z79811 Long term (current) use of aromatase inhibitors: Secondary | ICD-10-CM | POA: Diagnosis not present

## 2017-05-24 DIAGNOSIS — C50812 Malignant neoplasm of overlapping sites of left female breast: Secondary | ICD-10-CM | POA: Insufficient documentation

## 2017-05-24 DIAGNOSIS — I252 Old myocardial infarction: Secondary | ICD-10-CM | POA: Diagnosis not present

## 2017-05-24 DIAGNOSIS — Z79899 Other long term (current) drug therapy: Secondary | ICD-10-CM | POA: Diagnosis not present

## 2017-05-24 DIAGNOSIS — Z87891 Personal history of nicotine dependence: Secondary | ICD-10-CM | POA: Insufficient documentation

## 2017-05-24 DIAGNOSIS — Z95818 Presence of other cardiac implants and grafts: Secondary | ICD-10-CM | POA: Insufficient documentation

## 2017-05-24 DIAGNOSIS — K5730 Diverticulosis of large intestine without perforation or abscess without bleeding: Secondary | ICD-10-CM | POA: Insufficient documentation

## 2017-05-24 DIAGNOSIS — M25561 Pain in right knee: Secondary | ICD-10-CM | POA: Insufficient documentation

## 2017-05-24 DIAGNOSIS — I2510 Atherosclerotic heart disease of native coronary artery without angina pectoris: Secondary | ICD-10-CM | POA: Diagnosis not present

## 2017-05-24 DIAGNOSIS — M25562 Pain in left knee: Secondary | ICD-10-CM | POA: Diagnosis not present

## 2017-05-24 DIAGNOSIS — Z170 Estrogen receptor positive status [ER+]: Secondary | ICD-10-CM | POA: Insufficient documentation

## 2017-05-24 DIAGNOSIS — N183 Chronic kidney disease, stage 3 (moderate): Secondary | ICD-10-CM | POA: Diagnosis not present

## 2017-05-24 DIAGNOSIS — Z7982 Long term (current) use of aspirin: Secondary | ICD-10-CM | POA: Diagnosis not present

## 2017-05-24 DIAGNOSIS — Z9221 Personal history of antineoplastic chemotherapy: Secondary | ICD-10-CM | POA: Insufficient documentation

## 2017-05-24 DIAGNOSIS — I129 Hypertensive chronic kidney disease with stage 1 through stage 4 chronic kidney disease, or unspecified chronic kidney disease: Secondary | ICD-10-CM | POA: Insufficient documentation

## 2017-05-24 DIAGNOSIS — C773 Secondary and unspecified malignant neoplasm of axilla and upper limb lymph nodes: Secondary | ICD-10-CM | POA: Insufficient documentation

## 2017-05-24 LAB — COMPREHENSIVE METABOLIC PANEL WITH GFR
ALT: 32 U/L (ref 14–54)
AST: 40 U/L (ref 15–41)
Albumin: 3.9 g/dL (ref 3.5–5.0)
Alkaline Phosphatase: 71 U/L (ref 38–126)
Anion gap: 10 (ref 5–15)
BUN: 21 mg/dL — ABNORMAL HIGH (ref 6–20)
CO2: 25 mmol/L (ref 22–32)
Calcium: 9.6 mg/dL (ref 8.9–10.3)
Chloride: 104 mmol/L (ref 101–111)
Creatinine, Ser: 1.13 mg/dL — ABNORMAL HIGH (ref 0.44–1.00)
GFR calc Af Amer: 54 mL/min — ABNORMAL LOW
GFR calc non Af Amer: 47 mL/min — ABNORMAL LOW
Glucose, Bld: 147 mg/dL — ABNORMAL HIGH (ref 65–99)
Potassium: 4.1 mmol/L (ref 3.5–5.1)
Sodium: 139 mmol/L (ref 135–145)
Total Bilirubin: 0.9 mg/dL (ref 0.3–1.2)
Total Protein: 7.1 g/dL (ref 6.5–8.1)

## 2017-05-24 LAB — CBC WITH DIFFERENTIAL/PLATELET
BASOS PCT: 3 %
Basophils Absolute: 0.1 10*3/uL (ref 0–0.1)
EOS ABS: 0.1 10*3/uL (ref 0–0.7)
EOS PCT: 3 %
HEMATOCRIT: 32.5 % — AB (ref 35.0–47.0)
Hemoglobin: 11.5 g/dL — ABNORMAL LOW (ref 12.0–16.0)
Lymphocytes Relative: 34 %
Lymphs Abs: 0.7 10*3/uL — ABNORMAL LOW (ref 1.0–3.6)
MCH: 36.4 pg — ABNORMAL HIGH (ref 26.0–34.0)
MCHC: 35.3 g/dL (ref 32.0–36.0)
MCV: 103.2 fL — ABNORMAL HIGH (ref 80.0–100.0)
MONO ABS: 0.2 10*3/uL (ref 0.2–0.9)
MONOS PCT: 8 %
Neutro Abs: 1.1 10*3/uL — ABNORMAL LOW (ref 1.4–6.5)
Neutrophils Relative %: 52 %
Platelets: 158 10*3/uL (ref 150–440)
RBC: 3.15 MIL/uL — ABNORMAL LOW (ref 3.80–5.20)
RDW: 16.3 % — AB (ref 11.5–14.5)
WBC: 2 10*3/uL — ABNORMAL LOW (ref 3.6–11.0)

## 2017-05-24 NOTE — Progress Notes (Signed)
[facility] OFFICE PROGRESS NOTE  Patient Care Team: Patient, No Pcp Per as PCP - General (General Practice) [name], MD (Unknown Physician Specialty) [name], MD (General Surgery)  Cancer Staging No matching staging information was found for the patient.   Oncology History   1. 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  Biopsy from the lymph node in the left axilla positive for metastatic memory carcinoma Estrogen receptor positive Progesterone receptor +ve hER-2/neu receptors equivocal  by [facility]  IHC for HER-2/neu is 2+ Clinically staged asT4 D. N1 M0 tumor stage IV  locally advanced carcinoma.. 2. She was started on   [place] OF 2015. 3. Treatment was changed to Cytoxan and Adriamycinffrom August 31, 2013  4.patient has finished oral 3 cycles of chemotherapy with Cytoxan and Adriamycin on October 19, 2013. 5.Patient had a bilateral mastectomy in November of 2015. ypT4B ypN3 ypM1 STAGE iv DISEASE.. repeat HER-2/neu receptor by [facility] is still equivocal (November, 2015).  6.  Started on Nov 2015-  letrozole and IBRANCE ; PET may 2017- NED; NOV 1st 2017- NED   # # Acute MI- s/p stenting [[facility]]  # RML 4 mm nodule- 1st NOV 2017- resolved     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 female, estrogen receptor positive ([place])      INTERVAL HISTORY:  Faith [name] Six 72 y.o.  female pleasant patient above history of locally advanced/stage IV ER PR positive question HER-2/neu negative breast cancer-currently on Ibrance/letrozole is here for follow-up.  Patient denies any fevers or chills.  Appetite is good.  No weight loss.  No nausea no vomiting.  No skin rash.  No diarrhea.  Has not had any heart problems.  Review of Systems  Constitutional: Negative for chills, diaphoresis, fever, malaise/fatigue and weight loss.  HENT:  Negative for nosebleeds and sore throat.   Eyes: Negative for double vision.  Respiratory: Negative for cough, hemoptysis, sputum production, shortness of breath and wheezing.   Cardiovascular: Negative for chest pain, palpitations, orthopnea and leg swelling.  Gastrointestinal: Negative for abdominal pain, blood in stool, constipation, diarrhea, heartburn, melena, nausea and vomiting.  Genitourinary: Negative for dysuria, frequency and urgency.  Musculoskeletal: Positive for joint pain. Negative for back pain.  Skin: Negative.  Negative for itching and rash.  Neurological: Negative for dizziness, tingling, focal weakness, weakness and headaches.  Endo/Heme/Allergies: Does not bruise/bleed easily.  Psychiatric/Behavioral: Negative for depression. The patient is not nervous/anxious and does not have insomnia.       PAST MEDICAL HISTORY :  Past Medical History:  Diagnosis Date  . Cancer [facility]) April 2015   left breast  . Hypertension   . Myocardial infarction ([place]) 11/03/2015   [facility], stent placed    PAST SURGICAL HISTORY :   Past Surgical History:  Procedure Laterality Date  . BREAST SURGERY Bilateral 11/19/13   bilater mastectomy   . PORTACATH PLACEMENT  06-07-13  . SHOULDER OPEN ROTATOR CUFF REPAIR  2012  . WRIST SURGERY Left 1969   cyst    FAMILY HISTORY :   Family History  Adopted: Yes    SOCIAL HISTORY:   Social History   Tobacco Use  . Smoking status: Former Smoker    Packs/day: 1.00    Years: 15.00    Pack years: 15.00    Types: Cigarettes    Last attempt to quit: 06/12/1985  Years since quitting: 31.9  . Smokeless tobacco: Never Used  Substance Use Topics  . Alcohol use: Yes    Alcohol/week: 0.0 oz  . Drug use: No    ALLERGIES:  is allergic to mushroom extract complex and shellfish allergy.  MEDICATIONS:  Current Outpatient Medications  Medication Sig Dispense Refill  . acetaminophen (TYLENOL) 500 MG tablet Take 500 mg by mouth every 6 (six) hours as  needed.    Marland Kitchen atorvastatin (LIPITOR) 80 MG tablet TAKE 1 TABLET BY MOUTH  NIGHTLY    . BRILINTA 90 MG TABS tablet Take 90 mg by mouth 2 (two) times daily.    . Calcium Carbonate-Vitamin D (CALCIUM + D PO) Take 1 tablet by mouth daily.    . Cholecalciferol (VITAMIN D PO) Take 5,000 Units by mouth daily.    Marland Kitchen HYDROcodone-acetaminophen (NORCO/VICODIN) 5-325 MG tablet 1 tab po qd prn 6 tablet 0  . IBRANCE 125 MG capsule Take 1 capsule (125 mg total) by mouth daily with breakfast. Take for 3 weeks, then 1 week off. 21 capsule 4  . letrozole (FEMARA) 2.5 MG tablet TAKE 1 TABLET BY MOUTH  DAILY 90 tablet 1  . losartan (COZAAR) 25 MG tablet Take 25 mg by mouth daily.    . ondansetron (ZOFRAN-ODT) 8 MG disintegrating tablet Take by mouth every 8 (eight) hours as needed.     . metoprolol tartrate (LOPRESSOR) 25 MG tablet Take 25 mg by mouth 2 (two) times daily.     . nitroGLYCERIN (NITROSTAT) 0.4 MG SL tablet Place under the tongue.     No current facility-administered medications for this visit.    Facility-Administered Medications Ordered in Other Visits  Medication Dose Route Frequency Provider Last Rate Last Dose  . sodium chloride 0.9 % injection 10 mL  10 mL Intravenous PRN [name], MD   10 mL at 09/12/14 1014    PHYSICAL EXAMINATION: ECOG PERFORMANCE STATUS: 0 - Asymptomatic  BP 138/72 (BP Location: Left Arm, Patient Position: Sitting)   Pulse 70   Temp (!) 97.1 F (36.2 C) (Tympanic)   Resp 16   Wt 242 lb 6.3 oz (109.9 kg)   BMI 37.96 kg/m   Filed Weights   05/24/17 0953  Weight: 242 lb 6.3 oz (109.9 kg)    GENERAL: Well-nourished well-developed; Alert, no distress and comfortable.  Alone.  EYES: no pallor or icterus OROPHARYNX: no thrush or ulceration; NECK: supple; no lymph nodes felt. LYMPH:  no palpable lymphadenopathy in the axillary or inguinal regions LUNGS: Decreased breath sounds auscultation bilaterally. No wheeze or crackles HEART/CVS: regular rate & rhythm  and no murmurs; No lower extremity edema ABDOMEN:abdomen soft, non-tender and normal bowel sounds. No hepatomegaly or splenomegaly.  Musculoskeletal:no cyanosis of digits and no clubbing  PSYCH: alert & oriented x 3 with fluent speech NEURO: no focal motor/sensory deficits SKIN:  no rashes or significant lesions    LABORATORY DATA:  I have reviewed the data as listed    Component Value Date/Time   NA 139 05/24/2017 0929   NA 137 05/16/2014 1154   K 4.1 05/24/2017 0929   K 3.9 05/16/2014 1154   CL 104 05/24/2017 0929   CL 104 05/16/2014 1154   CO2 25 05/24/2017 0929   CO2 25 05/16/2014 1154   GLUCOSE 147 (H) 05/24/2017 0929   GLUCOSE 110 (H) 05/16/2014 1154   BUN 21 (H) 05/24/2017 0929   BUN 20 05/16/2014 1154   CREATININE 1.13 (H) 05/24/2017 0929   CREATININE 1.00  05/16/2014 1154   CALCIUM 9.6 05/24/2017 0929   CALCIUM 9.6 05/16/2014 1154   PROT 7.1 05/24/2017 0929   PROT 7.4 05/16/2014 1154   ALBUMIN 3.9 05/24/2017 0929   ALBUMIN 4.0 05/16/2014 1154   AST 40 05/24/2017 0929   AST 26 05/16/2014 1154   ALT 32 05/24/2017 0929   ALT 37 05/16/2014 1154   ALKPHOS 71 05/24/2017 0929   ALKPHOS 72 05/16/2014 1154   BILITOT 0.9 05/24/2017 0929   BILITOT 0.5 05/16/2014 1154   GFRNONAA 47 (L) 05/24/2017 0929   GFRNONAA 57 (L) 05/16/2014 1154   GFRAA 54 (L) 05/24/2017 0929   GFRAA >60 05/16/2014 1154    No results found for: SPEP, UPEP  Lab Results  Component Value Date   WBC 2.0 (L) 05/24/2017   NEUTROABS 1.1 (L) 05/24/2017   HGB 11.5 (L) 05/24/2017   HCT 32.5 (L) 05/24/2017   MCV 103.2 (H) 05/24/2017   PLT 158 05/24/2017      Chemistry      Component Value Date/Time   NA 139 05/24/2017 0929   NA 137 05/16/2014 1154   K 4.1 05/24/2017 0929   K 3.9 05/16/2014 1154   CL 104 05/24/2017 0929   CL 104 05/16/2014 1154   CO2 25 05/24/2017 0929   CO2 25 05/16/2014 1154   BUN 21 (H) 05/24/2017 0929   BUN 20 05/16/2014 1154   CREATININE 1.13 (H) 05/24/2017 0929    CREATININE 1.00 05/16/2014 1154      Component Value Date/Time   CALCIUM 9.6 05/24/2017 0929   CALCIUM 9.6 05/16/2014 1154   ALKPHOS 71 05/24/2017 0929   ALKPHOS 72 05/16/2014 1154   AST 40 05/24/2017 0929   AST 26 05/16/2014 1154   ALT 32 05/24/2017 0929   ALT 37 05/16/2014 1154   BILITOT 0.9 05/24/2017 0929   BILITOT 0.5 05/16/2014 1154       RADIOGRAPHIC STUDIES: I have personally reviewed the radiological images as listed and agreed with the findings in the report. No results found.   ASSESSMENT & PLAN:  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 female, estrogen receptor positive ([place]) #Breast cancer stage IV/locally advanced [ER PR positive; ?  HER-2/neu positive-2015]; currently on Ibrance letrozole.  CT scan January 2019-NED.  #Clinically no evidence of progression.  Continue Ibrance letrozole.  Patient questioning the duration of the treatment.  Long discussion with her regarding pros and cons of continued therapy-given her high risk disease I would recommend continued current therapy unless she is having significant problems.  #Leukopenia/neutropenia ANC 1.1-continue Ibrance at current dose of 125 mg 3 weeks on 1 week off.  Patient will start Ibrance in approximately 5 days from now.  # CAD s/p stenting [facility]; Dr.Fath]- on anti-platelet therapy; no further events.  #Bilateral knee pain stable.  #CKD stage III-creatinine stable 1.13.  # Follow-up in 2 months/CBC CMP CA 27-29. [To adjust Ibrance dosing] .   Orders Placed This Encounter  Procedures  . CBC with Differential/Platelet    Standing Status:   Future    Standing Expiration Date:   05/25/2018  . Comprehensive metabolic panel    Standing Status:   Future    Standing Expiration Date:   05/25/2018  . Cancer antigen 27.29    Standing Status:   Future    Standing Expiration Date:   05/24/2018   All questions were answered. The patient knows to call the clinic with any problems, questions or concerns.  Faith Sickle, MD 05/24/2017 12:44 PM

## 2017-05-24 NOTE — Assessment & Plan Note (Addendum)
#  Breast cancer stage IV/locally advanced [ER PR positive; ?  HER-2/neu positive-2015]; currently on Ibrance letrozole.  CT scan January 2019-NED.  #Clinically no evidence of progression.  Continue Ibrance letrozole.  Patient questioning the duration of the treatment.  Long discussion with her regarding pros and cons of continued therapy-given her high risk disease I would recommend continued current therapy unless she is having significant problems.  #Leukopenia/neutropenia ANC 1.1-continue Ibrance at current dose of 125 mg 3 weeks on 1 week off.  Patient will start Ibrance in approximately 5 days from now.  # CAD s/p stenting [facility]; Dr.Fath]- on anti-platelet therapy; no further events.  #Bilateral knee pain stable.  #CKD stage III-creatinine stable 1.13.  # Follow-up in 2 months/CBC CMP CA 27-29. [To adjust Ibrance dosing] .  # 25 minutes face-to-face with the patient discussing the above plan of care; more than 50% of time spent on prognosis/ natural history; counseling and coordination.

## 2017-05-25 LAB — CANCER ANTIGEN 27.29: CAN 27.29: 13.2 U/mL (ref 0.0–38.6)

## 2017-06-20 ENCOUNTER — Other Ambulatory Visit: Payer: Self-pay | Admitting: Internal Medicine

## 2017-06-20 DIAGNOSIS — Z170 Estrogen receptor positive status [ER+]: Principal | ICD-10-CM

## 2017-06-20 DIAGNOSIS — C50812 Malignant neoplasm of overlapping sites of left female breast: Secondary | ICD-10-CM

## 2017-06-22 MED FILL — IBRANCE 125 MG CAPSULE: 125 | 28 days supply | Qty: 21 | Fill #0

## 2017-06-29 ENCOUNTER — Other Ambulatory Visit: Payer: Self-pay | Admitting: Internal Medicine

## 2017-06-29 DIAGNOSIS — C50912 Malignant neoplasm of unspecified site of left female breast: Secondary | ICD-10-CM

## 2017-07-06 ENCOUNTER — Telehealth: Payer: Self-pay | Admitting: Pharmacist

## 2017-07-06 NOTE — Telephone Encounter (Signed)
Oral Chemotherapy Pharmacist Encounter  Follow-Up Form  Called patient today to follow up regarding patient's oral chemotherapy medication:  Ibrance (palbociclib)  Original Start date of oral chemotherapy:  11/2013  Pt reports 0 tablets/doses of Ibrance (palbociclib) missed in the last cycle. She is currently about midway through her current cycle.   Pt reports the following side effects: none, reported  Recent labs reviewed: CA 27.29 from 02/21/17  New medications?: none reported  Other Issues: none reported  Patient knows to call the office with questions or concerns. Oral Oncology Clinic will continue to follow.  Darl Pikes, PharmD, BCPS Hematology/Oncology Clinical Pharmacist [facility] [phone]  07/06/2017 2:46 PM

## 2017-07-19 MED FILL — IBRANCE 125 MG CAPSULE: 125 | 28 days supply | Qty: 21 | Fill #1

## 2017-07-26 ENCOUNTER — Inpatient Hospital Stay: Payer: Medicare Other

## 2017-07-26 ENCOUNTER — Inpatient Hospital Stay: Payer: Medicare Other | Attending: Internal Medicine | Admitting: Internal Medicine

## 2017-07-26 ENCOUNTER — Other Ambulatory Visit: Payer: Self-pay

## 2017-07-26 ENCOUNTER — Telehealth: Payer: Self-pay | Admitting: Pharmacy Technician

## 2017-07-26 VITALS — BP 131/79 | HR 82 | Temp 97.6°F | Resp 20 | Ht 67.0 in | Wt 242.0 lb

## 2017-07-26 DIAGNOSIS — Z9221 Personal history of antineoplastic chemotherapy: Secondary | ICD-10-CM | POA: Insufficient documentation

## 2017-07-26 DIAGNOSIS — Z170 Estrogen receptor positive status [ER+]: Principal | ICD-10-CM

## 2017-07-26 DIAGNOSIS — N183 Chronic kidney disease, stage 3 (moderate): Secondary | ICD-10-CM | POA: Diagnosis not present

## 2017-07-26 DIAGNOSIS — Z79899 Other long term (current) drug therapy: Secondary | ICD-10-CM | POA: Insufficient documentation

## 2017-07-26 DIAGNOSIS — I129 Hypertensive chronic kidney disease with stage 1 through stage 4 chronic kidney disease, or unspecified chronic kidney disease: Secondary | ICD-10-CM

## 2017-07-26 DIAGNOSIS — C773 Secondary and unspecified malignant neoplasm of axilla and upper limb lymph nodes: Secondary | ICD-10-CM | POA: Insufficient documentation

## 2017-07-26 DIAGNOSIS — Z9013 Acquired absence of bilateral breasts and nipples: Secondary | ICD-10-CM | POA: Insufficient documentation

## 2017-07-26 DIAGNOSIS — C50812 Malignant neoplasm of overlapping sites of left female breast: Secondary | ICD-10-CM | POA: Insufficient documentation

## 2017-07-26 DIAGNOSIS — I252 Old myocardial infarction: Secondary | ICD-10-CM | POA: Diagnosis not present

## 2017-07-26 DIAGNOSIS — Z79811 Long term (current) use of aromatase inhibitors: Secondary | ICD-10-CM | POA: Diagnosis not present

## 2017-07-26 DIAGNOSIS — D709 Neutropenia, unspecified: Secondary | ICD-10-CM | POA: Diagnosis not present

## 2017-07-26 DIAGNOSIS — Z87891 Personal history of nicotine dependence: Secondary | ICD-10-CM

## 2017-07-26 LAB — CBC WITH DIFFERENTIAL/PLATELET
BASOS ABS: 0.1 10*3/uL (ref 0–0.1)
BASOS PCT: 2 %
Eosinophils Absolute: 0.1 10*3/uL (ref 0–0.7)
Eosinophils Relative: 2 %
HEMATOCRIT: 35.1 % (ref 35.0–47.0)
HEMOGLOBIN: 12.2 g/dL (ref 12.0–16.0)
Lymphocytes Relative: 31 %
Lymphs Abs: 1 10*3/uL (ref 1.0–3.6)
MCH: 35.8 pg — ABNORMAL HIGH (ref 26.0–34.0)
MCHC: 34.8 g/dL (ref 32.0–36.0)
MCV: 102.9 fL — ABNORMAL HIGH (ref 80.0–100.0)
Monocytes Absolute: 0.4 10*3/uL (ref 0.2–0.9)
Monocytes Relative: 12 %
NEUTROS ABS: 1.7 10*3/uL (ref 1.4–6.5)
NEUTROS PCT: 53 %
Platelets: 230 10*3/uL (ref 150–440)
RBC: 3.41 MIL/uL — ABNORMAL LOW (ref 3.80–5.20)
RDW: 15.4 % — ABNORMAL HIGH (ref 11.5–14.5)
WBC: 3.1 10*3/uL — ABNORMAL LOW (ref 3.6–11.0)

## 2017-07-26 LAB — COMPREHENSIVE METABOLIC PANEL
ALT: 43 U/L (ref 0–44)
ANION GAP: 12 (ref 5–15)
AST: 49 U/L — ABNORMAL HIGH (ref 15–41)
Albumin: 3.8 g/dL (ref 3.5–5.0)
Alkaline Phosphatase: 84 U/L (ref 38–126)
BUN: 21 mg/dL (ref 8–23)
CALCIUM: 9.9 mg/dL (ref 8.9–10.3)
CHLORIDE: 101 mmol/L (ref 98–111)
CO2: 25 mmol/L (ref 22–32)
Creatinine, Ser: 1.1 mg/dL — ABNORMAL HIGH (ref 0.44–1.00)
GFR calc non Af Amer: 49 mL/min — ABNORMAL LOW (ref >60)
GFR, EST AFRICAN AMERICAN: 56 mL/min — AB (ref >60)
Glucose, Bld: 146 mg/dL — ABNORMAL HIGH (ref 70–99)
POTASSIUM: 3.9 mmol/L (ref 3.5–5.1)
SODIUM: 138 mmol/L (ref 135–145)
Total Bilirubin: 1 mg/dL (ref 0.3–1.2)
Total Protein: 7.2 g/dL (ref 6.5–8.1)

## 2017-07-26 NOTE — Assessment & Plan Note (Addendum)
#  Breast cancer stage IV/locally advanced [ER PR positive; ?  HER-2/neu positive-2015]; currently on Ibrance letrozole.  CT scan January 2019-NED.  #Clinically no evidence of progression/STABLE.  Continue Ibrance letrozole.   #Leukopenia/neutropenia-STABLE. ANC 1.7-continue Ibrance at current dose of 125 mg 3 weeks on 1 week off.    # CAD s/p stenting [facility]; Dr.Fath]- on anti-platelet therapy; STABLE.   #CKD stage III-creatinine - 1.12; STABLE.    # Follow-up in 2 months/CBC CMP CA 27-29.

## 2017-07-26 NOTE — Telephone Encounter (Signed)
Oral Oncology Patient Advocate Encounter  Patient was approved for 2 grants to cover copayment for Ibrance.  TAF Patient has been conditionally approved for copay assistance with [employer] [place] (TAF).    Conditional enrollment 07/19/17 to 08/18/17.  The patient will receive an enrollment application in the mail to complete and return to TAF.  This application's submission is required for the patient's continued enrollment after 08/18/17.  I have discussed this with the patient.   When fully enrolled, The Assistance Fund will cover all copayment expenses for Ibrance for the remainder of the calendar year.    The billing information is as follows and has been shared with [place].   Member ID: [id number] Group ID: [id number] PCN: [id number] BIN: [id number]  PANF Was successful in securing patient an $5500 grant from Patient [place] [facility]) to provide copayment coverage for Ibrance.  This will keep the out of pocket expense at $0.    The billing information is as follows and has been shared with [place].   Member ID: [id number] Group ID: [id number] RxBin: [id number] Dates of Eligibility: 04/27/17 through 04/27/18  I have spoken with the patient about both grants.  [place] Patient [place] Phone [phone] Fax [phone] 07/26/2017 10:19 AM

## 2017-07-27 LAB — CANCER ANTIGEN 27.29: CA 27.29: 18 U/mL (ref 0.0–38.6)

## 2017-07-28 NOTE — Telephone Encounter (Signed)
Oral Oncology Patient Advocate Encounter  Patient returned (TAF) application completed. I faxed to (TAF).   Awaiting approval  [place] Patient [place] Phone [phone] Fax [phone]

## 2017-07-29 ENCOUNTER — Ambulatory Visit (INDEPENDENT_AMBULATORY_CARE_PROVIDER_SITE_OTHER): Payer: Medicare Other

## 2017-07-29 ENCOUNTER — Encounter: Payer: Self-pay | Admitting: Emergency Medicine

## 2017-07-29 ENCOUNTER — Other Ambulatory Visit: Payer: Self-pay

## 2017-07-29 ENCOUNTER — Ambulatory Visit
Admission: EM | Admit: 2017-07-29 | Discharge: 2017-07-29 | Disposition: A | Discharge status: Home / Self Care | Payer: Medicare Other | Attending: Emergency Medicine | Admitting: Emergency Medicine

## 2017-07-29 DIAGNOSIS — M79632 Pain in left forearm: Secondary | ICD-10-CM

## 2017-07-29 DIAGNOSIS — Z23 Encounter for immunization: Secondary | ICD-10-CM

## 2017-07-29 DIAGNOSIS — S51832A Puncture wound without foreign body of left forearm, initial encounter: Secondary | ICD-10-CM

## 2017-07-29 DIAGNOSIS — W540XXA Bitten by dog, initial encounter: Secondary | ICD-10-CM

## 2017-07-29 DIAGNOSIS — R2232 Localized swelling, mass and lump, left upper limb: Secondary | ICD-10-CM

## 2017-07-29 MED ORDER — IBUPROFEN 400 MG PO TABS: 400.0000 mg | ORAL_TABLET | Freq: Four times a day (QID) | ORAL | 0 refills | Status: DC | PRN

## 2017-07-29 MED ORDER — TETANUS-DIPHTH-ACELL PERTUSSIS 5-2.5-18.5 LF-MCG/0.5 IM SUSP
0.5000 mL | Freq: Once | INTRAMUSCULAR | Status: AC
  Administered 2017-07-29: 0.5 mL via INTRAMUSCULAR

## 2017-07-29 MED ORDER — AMOXICILLIN-POT CLAVULANATE 875-125 MG PO TABS: 1.0000 | ORAL_TABLET | Freq: Two times a day (BID) | ORAL | 0 refills | Status: AC

## 2017-07-29 MED ORDER — BACITRACIN ZINC 500 UNIT/GM EX OINT: 1.0000 "application " | TOPICAL_OINTMENT | Freq: Once | CUTANEOUS | Status: DC

## 2017-07-29 NOTE — Discharge Instructions (Addendum)
Call Dr. Ubaldo Glassing make sure that it is okay to take the ibuprofen.  400 mg of ibuprofen with 500 mg of Tylenol together 3 or 4 times a day as needed.  Ice it for 20 minutes at a time.  Keep this elevated above your heart is much as you can.  Initially antibiotics.  Go to the ER for fevers above 100.4, pain not controlled with medications, redness, swelling, redness streaking up your arm, or for other concerns.

## 2017-07-29 NOTE — ED Provider Notes (Signed)
HPI  SUBJECTIVE:  Faith [name] is an ambidextrous 72 y.o. female who presents with a dog bite to her left forearm sustained 30 minutes prior to arrival.  Patient states that this was an unprovoked bite by her friend's dog.  She states that its immunizations are up-to-date.  She reports mild pain at the site, bruising.  No numbness tingling in fingers, limitation of motion of her fingers.  No foreign body sensation.  No swelling.  No aggravating or alleviating factors.  She has not tried anything for this.  She has a past medical history of hypertension, MI, breast cancer.  No history of diabetes.  Tetanus unknown.  PMD: Dr. Ubaldo Glassing cardiology, Dr. Ranell Patrick, oncology.  They do all of her care.  Past Medical History:  Diagnosis Date  . Cancer [facility]) April 2015   left breast  . Hypertension   . Myocardial infarction [facility]) 11/03/2015   [facility], stent placed    Past Surgical History:  Procedure Laterality Date  . BREAST SURGERY Bilateral 11/19/13   bilater mastectomy   . PORTACATH PLACEMENT  06-07-13  . SHOULDER OPEN ROTATOR CUFF REPAIR  2012  . WRIST SURGERY Left 1969   cyst    Family History  Adopted: Yes    Social History   Tobacco Use  . Smoking status: Former Smoker    Packs/day: 1.00    Years: 15.00    Pack years: 15.00    Types: Cigarettes    Last attempt to quit: 06/12/1985    Years since quitting: 32.1  . Smokeless tobacco: Never Used  Substance Use Topics  . Alcohol use: Yes    Alcohol/week: 0.0 oz  . Drug use: No     Current Facility-Administered Medications:  .  bacitracin ointment 1 application, 1 application, Topical, Once, Melynda Ripple, MD  Current Outpatient Medications:  .  atorvastatin (LIPITOR) 80 MG tablet, TAKE 1 TABLET BY MOUTH  NIGHTLY, Disp: , Rfl:  .  Calcium Carbonate-Vitamin D (CALCIUM + D PO), Take 1 tablet by mouth daily., Disp: , Rfl:  .  IBRANCE 125 MG capsule, TAKE 1 CAPSULE (125 MG TOTAL) BY MOUTH DAILY WITH BREAKFAST. TAKE FOR 3 WEEKS,  THEN 1 WEEK OFF., Disp: 21 capsule, Rfl: 4 .  letrozole (FEMARA) 2.5 MG tablet, TAKE 1 TABLET BY MOUTH  DAILY, Disp: 90 tablet, Rfl: 1 .  losartan (COZAAR) 25 MG tablet, Take 25 mg by mouth daily., Disp: , Rfl:  .  metoprolol tartrate (LOPRESSOR) 25 MG tablet, Take 25 mg by mouth 2 (two) times daily. , Disp: , Rfl:  .  acetaminophen (TYLENOL) 500 MG tablet, Take 500 mg by mouth every 6 (six) hours as needed., Disp: , Rfl:  .  amoxicillin-clavulanate (AUGMENTIN) 875-125 MG tablet, Take 1 tablet by mouth 2 (two) times daily for 10 days., Disp: 20 tablet, Rfl: 0 .  Cholecalciferol (VITAMIN D PO), Take 5,000 Units by mouth daily., Disp: , Rfl:  .  ibuprofen (ADVIL,MOTRIN) 400 MG tablet, Take 1 tablet (400 mg total) by mouth every 6 (six) hours as needed., Disp: 20 tablet, Rfl: 0 .  nitroGLYCERIN (NITROSTAT) 0.4 MG SL tablet, Place under the tongue., Disp: , Rfl:  .  ondansetron (ZOFRAN-ODT) 8 MG disintegrating tablet, Take by mouth every 8 (eight) hours as needed. , Disp: , Rfl:   Facility-Administered Medications Ordered in Other Encounters:  .  sodium chloride 0.9 % injection 10 mL, 10 mL, Intravenous, PRN, Choksi, Janak, MD, 10 mL at 09/12/14 1014  Allergies  Allergen Reactions  . Mushroom Extract Complex Hives and Swelling  . Shellfish Allergy Nausea And Vomiting     ROS  As noted in HPI.   Physical Exam  BP (!) 158/58 (BP Location: Right Arm)   Pulse 88   Temp 98.2 F (36.8 C) (Oral)   Resp 16   Ht 5' 6.5" (1.689 m)   Wt 240 lb (108.9 kg)   SpO2 96%   BMI 38.16 kg/m   Constitutional: Well developed, well nourished, no acute distress Eyes:  EOMI, conjunctiva normal bilaterally HENT: Normocephalic, atraumatic,mucus membranes moist Respiratory: Normal inspiratory effort Cardiovascular: Normal rate GI: nondistended skin: No rash, skin intact Musculoskeletal: 2 puncture wounds distal left forearm.  Positive bruising.  No swelling.  No bony tenderness.  No foreign bodies  noted.  Motor, sensation intact distally in the median/radial/ulnar distribution.      Neurologic: Alert & oriented x 3, no focal neuro deficits Psychiatric: Speech and behavior appropriate   ED Course   Medications  bacitracin ointment 1 application (has no administration in time range)  Tdap (BOOSTRIX) injection 0.5 mL (0.5 mLs Intramuscular Given 07/29/17 1255)    Orders Placed This Encounter  Procedures  . DG Forearm Left    Standing Status:   Standing    Number of Occurrences:   1    Order Specific Question:   Reason for Exam (SYMPTOM  OR DIAGNOSIS REQUIRED)    Answer:   dog bite r/o retained FB  . Apply dressing    Standing Status:   Standing    Number of Occurrences:   1    No results found for this or any previous visit (from the past 24 hour(s)). Dg Forearm Left  Result Date: 07/29/2017 CLINICAL DATA:  Pain following dog bite EXAM: LEFT FOREARM - 2 VIEW COMPARISON:  None. FINDINGS: Frontal and lateral views were obtained. There is diffuse soft tissue swelling. There is no soft tissue air or radiopaque foreign body. No fracture or dislocation. Joint spaces appear normal. No bony destruction evident. IMPRESSION: Soft tissue swelling. No radiopaque foreign body or soft tissue air. No fracture or dislocation. No bony destruction. No appreciable arthropathy. Electronically Signed   By: Lowella Grip III M.D.   On: 07/29/2017 13:25    ED Clinical Impression  Dog bite, initial encounter   ED Assessment/Plan  Tetanus updated.  Staff contacted animal control.    Patient with dog bite to the left forearm.  Will x-ray forearm to rule out any retained foreign bodies.  Doubt fracture.  Had patient irrigate wounds out thoroughly with tap water and chlorhexidine for several minutes.  Applied bacitracin and dry sterile dressing on top of this.  If x-ray is negative, plan to send home with 10 days of Augmentin, ibuprofen 400 mg to take with 500 mg of Tylenol together 3 or 4  times a day.  Patient states that she will call her cardiologist to make sure that it is okay to take the ibuprofen.  Patient declined prescription of Norco.  Advised ice, elevation.  Reviewed imaging independently.  No foreign body.  See radiology report for full details.  Discussed imaging, MDM, treatment plan, and plan for follow-up with patient. Discussed sn/sx that should prompt return to the ED. patient agrees with plan.   Meds ordered this encounter  Medications  . Tdap (BOOSTRIX) injection 0.5 mL  . bacitracin ointment 1 application  . amoxicillin-clavulanate (AUGMENTIN) 875-125 MG tablet    Sig: Take 1 tablet by mouth 2 (  two) times daily for 10 days.    Dispense:  20 tablet    Refill:  0  . ibuprofen (ADVIL,MOTRIN) 400 MG tablet    Sig: Take 1 tablet (400 mg total) by mouth every 6 (six) hours as needed.    Dispense:  20 tablet    Refill:  0    *This clinic note was created using [employer]. Therefore, there may be occasional mistakes despite careful proofreading.   ?   Melynda Ripple, MD 07/29/17 1338

## 2017-07-29 NOTE — ED Triage Notes (Signed)
Patient states that she was helping her friend into her house and her friend's dog bit her on her left forearm about 30 min ago.

## 2017-07-31 NOTE — Progress Notes (Signed)
[facility] OFFICE PROGRESS NOTE  Patient Care Team: Patient, No Pcp Per as PCP - General (General Practice) [name], MD (Inactive) (Unknown Physician Specialty) [name], MD (General Surgery)  Cancer Staging No matching staging information was found for the patient.   Oncology History   1. 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  Biopsy from the lymph node in the left axilla positive for metastatic memory carcinoma Estrogen receptor positive Progesterone receptor +ve hER-2/neu receptors equivocal  by [facility]  IHC for HER-2/neu is 2+ Clinically staged asT4 D. N1 M0 tumor stage IV  locally advanced carcinoma.. 2. She was started on   [place] OF 2015. 3. Treatment was changed to Cytoxan and Adriamycinffrom August 31, 2013  4.patient has finished oral 3 cycles of chemotherapy with Cytoxan and Adriamycin on October 19, 2013. 5.Patient had a bilateral mastectomy in November of 2015. ypT4B ypN3 ypM1 STAGE iv DISEASE.. repeat HER-2/neu receptor by [facility] is still equivocal (November, 2015).  6.  Started on Nov 2015-  letrozole and IBRANCE ; PET may 2017- NED; NOV 1st 2017- NED   # # Acute MI- s/p stenting [[facility]]  # RML 4 mm nodule- 1st NOV 2017- resolved ---------------------------------------------------------   DIAGNOSIS: BREAST CA  STAGE:  IV       ;GOALS: palliative  CURRENT/MOST RECENT THERAPY: Ibrance+ Femara      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 female, estrogen receptor positive ([place])      INTERVAL HISTORY:  Faith [name] 72 y.o.  female pleasant patient above history of metastatic breast cancer ER PR positive ? HER-2/neu positive currently on Ibrance plus Femara is here for follow-up.  Patient denies any fever chills.  Denies any diarrhea.  Denies towards the month.  Appetite is good.  Review of Systems   Constitutional: Negative for chills, diaphoresis, fever, malaise/fatigue and weight loss.  HENT: Negative for nosebleeds and sore throat.   Eyes: Negative for double vision.  Respiratory: Negative for cough, hemoptysis, sputum production, shortness of breath and wheezing.   Cardiovascular: Negative for chest pain, palpitations, orthopnea and leg swelling.  Gastrointestinal: Negative for abdominal pain, blood in stool, constipation, diarrhea, heartburn, melena, nausea and vomiting.  Genitourinary: Negative for dysuria, frequency and urgency.  Musculoskeletal: Negative for back pain and joint pain.  Skin: Negative.  Negative for itching and rash.  Neurological: Negative for dizziness, tingling, focal weakness, weakness and headaches.  Endo/Heme/Allergies: Does not bruise/bleed easily.  Psychiatric/Behavioral: Negative for depression. The patient is not nervous/anxious and does not have insomnia.       PAST MEDICAL HISTORY :  Past Medical History:  Diagnosis Date  . Cancer [facility]) April 2015   left breast  . Hypertension   . Myocardial infarction ([place]) 11/03/2015   [facility], stent placed    PAST SURGICAL HISTORY :   Past Surgical History:  Procedure Laterality Date  . BREAST SURGERY Bilateral 11/19/13   bilater mastectomy   . PORTACATH PLACEMENT  06-07-13  . SHOULDER OPEN ROTATOR CUFF REPAIR  2012  . WRIST SURGERY Left 1969   cyst    FAMILY HISTORY :   Family History  Adopted: Yes    SOCIAL HISTORY:   Social History   Tobacco Use  . Smoking status: Former Smoker    Packs/day: 1.00    Years: 15.00    Pack years: 15.00  Types: Cigarettes    Last attempt to quit: 06/12/1985    Years since quitting: 32.1  . Smokeless tobacco: Never Used  Substance Use Topics  . Alcohol use: Yes    Alcohol/week: 0.0 oz  . Drug use: No    ALLERGIES:  is allergic to mushroom extract complex and shellfish allergy.  MEDICATIONS:  Current Outpatient Medications  Medication Sig Dispense  Refill  . atorvastatin (LIPITOR) 80 MG tablet TAKE 1 TABLET BY MOUTH  NIGHTLY    . Calcium Carbonate-Vitamin D (CALCIUM + D PO) Take 1 tablet by mouth daily.    . Cholecalciferol (VITAMIN D PO) Take 5,000 Units by mouth daily.    [name] 125 MG capsule TAKE 1 CAPSULE (125 MG TOTAL) BY MOUTH DAILY WITH BREAKFAST. TAKE FOR 3 WEEKS, THEN 1 WEEK OFF. 21 capsule 4  . letrozole (FEMARA) 2.5 MG tablet TAKE 1 TABLET BY MOUTH  DAILY 90 tablet 1  . losartan (COZAAR) 25 MG tablet Take 25 mg by mouth daily.    . metoprolol tartrate (LOPRESSOR) 25 MG tablet Take 25 mg by mouth 2 (two) times daily.     Marland Kitchen acetaminophen (TYLENOL) 500 MG tablet Take 500 mg by mouth every 6 (six) hours as needed.    Marland Kitchen amoxicillin-clavulanate (AUGMENTIN) 875-125 MG tablet Take 1 tablet by mouth 2 (two) times daily for 10 days. 20 tablet 0  . ibuprofen (ADVIL,MOTRIN) 400 MG tablet Take 1 tablet (400 mg total) by mouth every 6 (six) hours as needed. 20 tablet 0  . nitroGLYCERIN (NITROSTAT) 0.4 MG SL tablet Place under the tongue.    . ondansetron (ZOFRAN-ODT) 8 MG disintegrating tablet Take by mouth every 8 (eight) hours as needed.      No current facility-administered medications for this visit.    Facility-Administered Medications Ordered in Other Visits  Medication Dose Route Frequency Provider Last Rate Last Dose  . sodium chloride 0.9 % injection 10 mL  10 mL Intravenous PRN [name], MD   10 mL at 09/12/14 1014    PHYSICAL EXAMINATION: ECOG PERFORMANCE STATUS: 0 - Asymptomatic  BP 131/79 (BP Location: Right Arm, Patient Position: Sitting)   Pulse 82   Temp 97.6 F (36.4 C) (Tympanic)   Resp 20   Ht '5\' 7"'  (1.702 m)   Wt 242 lb (109.8 kg)   BMI 37.90 kg/m   Filed Weights   07/26/17 1059  Weight: 242 lb (109.8 kg)    GENERAL: Well-nourished well-developed; Alert, no distress and comfortable.  Alone. EYES: no pallor or icterus OROPHARYNX: no thrush or ulceration; NECK: supple; no lymph nodes  felt. LYMPH:  no palpable lymphadenopathy in the axillary or inguinal regions LUNGS: Decreased breath sounds auscultation bilaterally. No wheeze or crackles HEART/CVS: regular rate & rhythm and no murmurs; No lower extremity edema ABDOMEN:abdomen soft, non-tender and normal bowel sounds. No hepatomegaly or splenomegaly.  Musculoskeletal:no cyanosis of digits and no clubbing  PSYCH: alert & oriented x 3 with fluent speech NEURO: no focal motor/sensory deficits SKIN:  no rashes or significant lesions    LABORATORY DATA:  I have reviewed the data as listed    Component Value Date/Time   NA 138 07/26/2017 1045   NA 137 05/16/2014 1154   K 3.9 07/26/2017 1045   K 3.9 05/16/2014 1154   CL 101 07/26/2017 1045   CL 104 05/16/2014 1154   CO2 25 07/26/2017 1045   CO2 25 05/16/2014 1154   GLUCOSE 146 (H) 07/26/2017 1045   GLUCOSE 110 (  H) 05/16/2014 1154   BUN 21 07/26/2017 1045   BUN 20 05/16/2014 1154   CREATININE 1.10 (H) 07/26/2017 1045   CREATININE 1.00 05/16/2014 1154   CALCIUM 9.9 07/26/2017 1045   CALCIUM 9.6 05/16/2014 1154   PROT 7.2 07/26/2017 1045   PROT 7.4 05/16/2014 1154   ALBUMIN 3.8 07/26/2017 1045   ALBUMIN 4.0 05/16/2014 1154   AST 49 (H) 07/26/2017 1045   AST 26 05/16/2014 1154   ALT 43 07/26/2017 1045   ALT 37 05/16/2014 1154   ALKPHOS 84 07/26/2017 1045   ALKPHOS 72 05/16/2014 1154   BILITOT 1.0 07/26/2017 1045   BILITOT 0.5 05/16/2014 1154   GFRNONAA 49 (L) 07/26/2017 1045   GFRNONAA 57 (L) 05/16/2014 1154   GFRAA 56 (L) 07/26/2017 1045   GFRAA >60 05/16/2014 1154    No results found for: SPEP, UPEP  Lab Results  Component Value Date   WBC 3.1 (L) 07/26/2017   NEUTROABS 1.7 07/26/2017   HGB 12.2 07/26/2017   HCT 35.1 07/26/2017   MCV 102.9 (H) 07/26/2017   PLT 230 07/26/2017      Chemistry      Component Value Date/Time   NA 138 07/26/2017 1045   NA 137 05/16/2014 1154   K 3.9 07/26/2017 1045   K 3.9 05/16/2014 1154   CL 101 07/26/2017  1045   CL 104 05/16/2014 1154   CO2 25 07/26/2017 1045   CO2 25 05/16/2014 1154   BUN 21 07/26/2017 1045   BUN 20 05/16/2014 1154   CREATININE 1.10 (H) 07/26/2017 1045   CREATININE 1.00 05/16/2014 1154      Component Value Date/Time   CALCIUM 9.9 07/26/2017 1045   CALCIUM 9.6 05/16/2014 1154   ALKPHOS 84 07/26/2017 1045   ALKPHOS 72 05/16/2014 1154   AST 49 (H) 07/26/2017 1045   AST 26 05/16/2014 1154   ALT 43 07/26/2017 1045   ALT 37 05/16/2014 1154   BILITOT 1.0 07/26/2017 1045   BILITOT 0.5 05/16/2014 1154       RADIOGRAPHIC STUDIES: I have personally reviewed the radiological images as listed and agreed with the findings in the report. No results found.   ASSESSMENT & PLAN:  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 female, estrogen receptor positive ([place]) #Breast cancer stage IV/locally advanced [ER PR positive; ?  HER-2/neu positive-2015]; currently on Ibrance letrozole.  CT scan January 2019-NED.  #Clinically no evidence of progression/STABLE.  Continue Ibrance letrozole.   #Leukopenia/neutropenia-STABLE. ANC 1.7-continue Ibrance at current dose of 125 mg 3 weeks on 1 week off.    # CAD s/p stenting [facility]; Dr.Fath]- on anti-platelet therapy; STABLE.   #CKD stage III-creatinine - 1.12; STABLE.    # Follow-up in 2 months/CBC CMP CA 27-29.     Orders Placed This Encounter  Procedures  . Comprehensive metabolic panel    Standing Status:   Future    Standing Expiration Date:   07/27/2018  . CBC with Differential    Standing Status:   Future    Standing Expiration Date:   07/27/2018  . Cancer antigen 27.29    Standing Status:   Future    Standing Expiration Date:   07/27/2018   All questions were answered. The patient knows to call the clinic with any problems, questions or concerns.      Cammie Sickle, MD 07/31/2017 5:26 PM

## 2017-08-01 NOTE — Telephone Encounter (Signed)
Oral Oncology Patient Advocate Encounter  Confirmed with [employer] that the patient has been fully enrolled in their copay assistance program until 01/17/2018.  The [place] will cover all of the patient's copay expenses for Ibrance until this date.    [place] Patient [place] Phone [phone] Fax [phone] 08/01/2017 9:27 AM

## 2017-08-17 MED FILL — IBRANCE 125 MG CAPSULE: 125 | 28 days supply | Qty: 21 | Fill #2

## 2017-09-08 ENCOUNTER — Encounter: Payer: Self-pay | Admitting: Internal Medicine

## 2017-09-12 MED FILL — IBRANCE 125 MG CAPSULE: 125 | 28 days supply | Qty: 21 | Fill #3

## 2017-09-13 ENCOUNTER — Other Ambulatory Visit: Payer: Self-pay | Admitting: *Deleted

## 2017-09-13 DIAGNOSIS — Z13220 Encounter for screening for lipoid disorders: Secondary | ICD-10-CM

## 2017-10-04 ENCOUNTER — Inpatient Hospital Stay (HOSPITAL_BASED_OUTPATIENT_CLINIC_OR_DEPARTMENT_OTHER): Payer: Medicare Other | Admitting: Internal Medicine

## 2017-10-04 ENCOUNTER — Other Ambulatory Visit: Payer: Self-pay

## 2017-10-04 ENCOUNTER — Inpatient Hospital Stay: Payer: Medicare Other | Attending: Internal Medicine

## 2017-10-04 VITALS — BP 181/53 | HR 69 | Temp 98.4°F | Resp 20 | Ht 66.5 in | Wt 244.0 lb

## 2017-10-04 DIAGNOSIS — Z79899 Other long term (current) drug therapy: Secondary | ICD-10-CM | POA: Diagnosis not present

## 2017-10-04 DIAGNOSIS — D709 Neutropenia, unspecified: Secondary | ICD-10-CM | POA: Insufficient documentation

## 2017-10-04 DIAGNOSIS — I129 Hypertensive chronic kidney disease with stage 1 through stage 4 chronic kidney disease, or unspecified chronic kidney disease: Secondary | ICD-10-CM | POA: Diagnosis not present

## 2017-10-04 DIAGNOSIS — N183 Chronic kidney disease, stage 3 (moderate): Secondary | ICD-10-CM | POA: Insufficient documentation

## 2017-10-04 DIAGNOSIS — C50812 Malignant neoplasm of overlapping sites of left female breast: Secondary | ICD-10-CM

## 2017-10-04 DIAGNOSIS — M25561 Pain in right knee: Secondary | ICD-10-CM

## 2017-10-04 DIAGNOSIS — I2510 Atherosclerotic heart disease of native coronary artery without angina pectoris: Secondary | ICD-10-CM | POA: Insufficient documentation

## 2017-10-04 DIAGNOSIS — Z13220 Encounter for screening for lipoid disorders: Secondary | ICD-10-CM

## 2017-10-04 DIAGNOSIS — Z170 Estrogen receptor positive status [ER+]: Secondary | ICD-10-CM | POA: Insufficient documentation

## 2017-10-04 DIAGNOSIS — M25562 Pain in left knee: Secondary | ICD-10-CM

## 2017-10-04 DIAGNOSIS — Z955 Presence of coronary angioplasty implant and graft: Secondary | ICD-10-CM | POA: Diagnosis not present

## 2017-10-04 LAB — COMPREHENSIVE METABOLIC PANEL
ALBUMIN: 4 g/dL (ref 3.5–5.0)
ALK PHOS: 71 U/L (ref 38–126)
ALT: 32 U/L (ref 0–44)
AST: 41 U/L (ref 15–41)
Anion gap: 10 (ref 5–15)
BUN: 21 mg/dL (ref 8–23)
CO2: 26 mmol/L (ref 22–32)
CREATININE: 1.01 mg/dL — AB (ref 0.44–1.00)
Calcium: 9.8 mg/dL (ref 8.9–10.3)
Chloride: 102 mmol/L (ref 98–111)
GFR calc non Af Amer: 54 mL/min — ABNORMAL LOW (ref >60)
GLUCOSE: 119 mg/dL — AB (ref 70–99)
Potassium: 4 mmol/L (ref 3.5–5.1)
SODIUM: 138 mmol/L (ref 135–145)
Total Bilirubin: 0.8 mg/dL (ref 0.3–1.2)
Total Protein: 7.3 g/dL (ref 6.5–8.1)

## 2017-10-04 LAB — CBC WITH DIFFERENTIAL/PLATELET
BASOS ABS: 0.1 10*3/uL (ref 0–0.1)
Basophils Relative: 3 %
EOS ABS: 0.1 10*3/uL (ref 0–0.7)
EOS PCT: 3 %
HCT: 33.2 % — ABNORMAL LOW (ref 35.0–47.0)
HEMOGLOBIN: 11.7 g/dL — AB (ref 12.0–16.0)
LYMPHS ABS: 0.7 10*3/uL — AB (ref 1.0–3.6)
LYMPHS PCT: 30 %
MCH: 36.7 pg — AB (ref 26.0–34.0)
MCHC: 35.4 g/dL (ref 32.0–36.0)
MCV: 103.8 fL — ABNORMAL HIGH (ref 80.0–100.0)
Monocytes Absolute: 0.1 10*3/uL — ABNORMAL LOW (ref 0.2–0.9)
Monocytes Relative: 6 %
NEUTROS PCT: 58 %
Neutro Abs: 1.4 10*3/uL (ref 1.4–6.5)
PLATELETS: 201 10*3/uL (ref 150–440)
RBC: 3.2 MIL/uL — AB (ref 3.80–5.20)
RDW: 15.8 % — ABNORMAL HIGH (ref 11.5–14.5)
WBC: 2.4 10*3/uL — AB (ref 3.6–11.0)

## 2017-10-04 LAB — CHOLESTEROL, TOTAL: Cholesterol: 121 mg/dL (ref 0–200)

## 2017-10-04 NOTE — Progress Notes (Signed)
[facility] OFFICE PROGRESS NOTE  Patient Care Team: Patient, No Pcp Per as PCP - General (General Practice) [name], MD (Inactive) (Unknown Physician Specialty) [name], MD (General Surgery)  Cancer Staging No matching staging information was found for the patient.   Oncology History   1. 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  Biopsy from the lymph node in the left axilla positive for metastatic memory carcinoma Estrogen receptor positive Progesterone receptor +ve hER-2/neu receptors equivocal  by [facility]  IHC for HER-2/neu is 2+ Clinically staged asT4 D. N1 M0 tumor stage IV  locally advanced carcinoma.. 2. She was started on   [place] OF 2015. 3. Treatment was changed to Cytoxan and Adriamycinffrom August 31, 2013  4.patient has finished oral 3 cycles of chemotherapy with Cytoxan and Adriamycin on October 19, 2013. 5.Patient had a bilateral mastectomy in November of 2015. ypT4B ypN3 ypM1 STAGE iv DISEASE.. repeat HER-2/neu receptor by [facility] is still equivocal (November, 2015).  6.  Started on Nov 2015-  letrozole and IBRANCE ; PET may 2017- NED; NOV 1st 2017- NED   # # Acute MI- s/p stenting [[facility]]  # RML 4 mm nodule- 1st NOV 2017- resolved ---------------------------------------------------------   DIAGNOSIS: BREAST CA  STAGE:  IV       ;GOALS: palliative  CURRENT/MOST RECENT THERAPY: Ibrance+ Femara      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 female, estrogen receptor positive ([place])      INTERVAL HISTORY:  Faith [name] 72 y.o.  female pleasant patient above history of metastatic breast cancer ER PR positive ? HER-2/neu positive currently on Ibrance plus Femara is here for follow-up.  Patient continues to have chronic bilateral knee pain.  Awaiting evaluation with orthopedics.  She continues to get  steroid injections.  Chronic leg swelling from lymphedema.  Otherwise no shortness of breath or cough.  No fever chills.  Appetite is fair.  No headaches.  Review of Systems  Constitutional: Negative for chills, diaphoresis, fever, malaise/fatigue and weight loss.  HENT: Negative for nosebleeds and sore throat.   Eyes: Negative for double vision.  Respiratory: Negative for cough, hemoptysis, sputum production, shortness of breath and wheezing.   Cardiovascular: Positive for leg swelling. Negative for chest pain, palpitations and orthopnea.  Gastrointestinal: Negative for abdominal pain, blood in stool, constipation, diarrhea, heartburn, melena, nausea and vomiting.  Genitourinary: Negative for dysuria, frequency and urgency.  Musculoskeletal: Positive for joint pain. Negative for back pain.  Skin: Negative.  Negative for itching and rash.  Neurological: Negative for dizziness, tingling, focal weakness, weakness and headaches.  Endo/Heme/Allergies: Does not bruise/bleed easily.  Psychiatric/Behavioral: Negative for depression. The patient is not nervous/anxious and does not have insomnia.       PAST MEDICAL HISTORY :  Past Medical History:  Diagnosis Date  . Cancer [facility]) April 2015   left breast  . Hypertension   . Myocardial infarction ([place]) 11/03/2015   [facility], stent placed    PAST SURGICAL HISTORY :   Past Surgical History:  Procedure Laterality Date  . BREAST SURGERY Bilateral 11/19/13   bilater mastectomy   . PORTACATH PLACEMENT  06-07-13  . SHOULDER OPEN ROTATOR CUFF REPAIR  2012  . WRIST SURGERY Left 1969   cyst    FAMILY HISTORY :   Family History  Adopted: Yes    SOCIAL HISTORY:  Social History   Tobacco Use  . Smoking status: Former Smoker    Packs/day: 1.00    Years: 15.00    Pack years: 15.00    Types: Cigarettes    Last attempt to quit: 06/12/1985    Years since quitting: 32.3  . Smokeless tobacco: Never Used  Substance Use Topics  . Alcohol use: Yes     Alcohol/week: 0.0 standard drinks  . Drug use: No    ALLERGIES:  is allergic to mushroom extract complex and shellfish allergy.  MEDICATIONS:  Current Outpatient Medications  Medication Sig Dispense Refill  . acetaminophen (TYLENOL) 500 MG tablet Take 500 mg by mouth every 6 (six) hours as needed.    Marland Kitchen atorvastatin (LIPITOR) 80 MG tablet TAKE 1 TABLET BY MOUTH  NIGHTLY    . Calcium Carbonate-Vitamin D (CALCIUM + D PO) Take 1 tablet by mouth daily.    . Cholecalciferol (VITAMIN D PO) Take 5,000 Units by mouth daily.    [name] 125 MG capsule TAKE 1 CAPSULE (125 MG TOTAL) BY MOUTH DAILY WITH BREAKFAST. TAKE FOR 3 WEEKS, THEN 1 WEEK OFF. 21 capsule 4  . letrozole (FEMARA) 2.5 MG tablet TAKE 1 TABLET BY MOUTH  DAILY 90 tablet 1  . losartan (COZAAR) 25 MG tablet Take 25 mg by mouth daily.    . metoprolol tartrate (LOPRESSOR) 25 MG tablet Take 25 mg by mouth 2 (two) times daily.     . nitroGLYCERIN (NITROSTAT) 0.4 MG SL tablet Place under the tongue.    . ondansetron (ZOFRAN-ODT) 8 MG disintegrating tablet Take by mouth every 8 (eight) hours as needed.      No current facility-administered medications for this visit.    Facility-Administered Medications Ordered in Other Visits  Medication Dose Route Frequency Provider Last Rate Last Dose  . sodium chloride 0.9 % injection 10 mL  10 mL Intravenous PRN [name], MD   10 mL at 09/12/14 1014    PHYSICAL EXAMINATION: ECOG PERFORMANCE STATUS: 0 - Asymptomatic  BP (!) 181/53 (BP Location: Right Arm, Patient Position: Sitting)   Pulse 69   Temp 98.4 F (36.9 C) (Oral)   Resp 20   Ht 5' 6.5" (1.689 m)   Wt 244 lb (110.7 kg)   BMI 38.79 kg/m   Filed Weights   10/04/17 1103  Weight: 244 lb (110.7 kg)    Physical Exam  Constitutional: She is oriented to person, place, and time and well-developed, well-nourished, and in no distress.  Obese.  Alone.  HENT:  Head: Normocephalic and atraumatic.  Mouth/Throat: Oropharynx is  clear and moist. No oropharyngeal exudate.  Eyes: Pupils are equal, round, and reactive to light.  Neck: Normal range of motion. Neck supple.  Cardiovascular: Normal rate and regular rhythm.  Pulmonary/Chest: No respiratory distress. She has no wheezes.  Abdominal: Soft. Bowel sounds are normal. She exhibits no distension and no mass. There is no tenderness. There is no rebound and no guarding.  Musculoskeletal: Normal range of motion. She exhibits no edema or tenderness.  Bilateral lower extremity chronic lymphedema.  Neurological: She is alert and oriented to person, place, and time.  Skin: Skin is warm.  Psychiatric: Affect normal.       LABORATORY DATA:  I have reviewed the data as listed    Component Value Date/Time   NA 138 10/04/2017 1039   NA 137 05/16/2014 1154   K 4.0 10/04/2017 1039   K 3.9 05/16/2014 1154   CL 102 10/04/2017 1039  CL 104 05/16/2014 1154   CO2 26 10/04/2017 1039   CO2 25 05/16/2014 1154   GLUCOSE 119 (H) 10/04/2017 1039   GLUCOSE 110 (H) 05/16/2014 1154   BUN 21 10/04/2017 1039   BUN 20 05/16/2014 1154   CREATININE 1.01 (H) 10/04/2017 1039   CREATININE 1.00 05/16/2014 1154   CALCIUM 9.8 10/04/2017 1039   CALCIUM 9.6 05/16/2014 1154   PROT 7.3 10/04/2017 1039   PROT 7.4 05/16/2014 1154   ALBUMIN 4.0 10/04/2017 1039   ALBUMIN 4.0 05/16/2014 1154   AST 41 10/04/2017 1039   AST 26 05/16/2014 1154   ALT 32 10/04/2017 1039   ALT 37 05/16/2014 1154   ALKPHOS 71 10/04/2017 1039   ALKPHOS 72 05/16/2014 1154   BILITOT 0.8 10/04/2017 1039   BILITOT 0.5 05/16/2014 1154   GFRNONAA 54 (L) 10/04/2017 1039   GFRNONAA 57 (L) 05/16/2014 1154   GFRAA >60 10/04/2017 1039   GFRAA >60 05/16/2014 1154    No results found for: SPEP, UPEP  Lab Results  Component Value Date   WBC 2.4 (L) 10/04/2017   NEUTROABS 1.4 10/04/2017   HGB 11.7 (L) 10/04/2017   HCT 33.2 (L) 10/04/2017   MCV 103.8 (H) 10/04/2017   PLT 201 10/04/2017      Chemistry       Component Value Date/Time   NA 138 10/04/2017 1039   NA 137 05/16/2014 1154   K 4.0 10/04/2017 1039   K 3.9 05/16/2014 1154   CL 102 10/04/2017 1039   CL 104 05/16/2014 1154   CO2 26 10/04/2017 1039   CO2 25 05/16/2014 1154   BUN 21 10/04/2017 1039   BUN 20 05/16/2014 1154   CREATININE 1.01 (H) 10/04/2017 1039   CREATININE 1.00 05/16/2014 1154      Component Value Date/Time   CALCIUM 9.8 10/04/2017 1039   CALCIUM 9.6 05/16/2014 1154   ALKPHOS 71 10/04/2017 1039   ALKPHOS 72 05/16/2014 1154   AST 41 10/04/2017 1039   AST 26 05/16/2014 1154   ALT 32 10/04/2017 1039   ALT 37 05/16/2014 1154   BILITOT 0.8 10/04/2017 1039   BILITOT 0.5 05/16/2014 1154       RADIOGRAPHIC STUDIES: I have personally reviewed the radiological images as listed and agreed with the findings in the report. No results found.   ASSESSMENT & PLAN:  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 female, estrogen receptor positive ([place]) # Breast cancer stage IV/locally advanced [ER PR positive; ?  HER-2/neu positive-2015]; currently on Ibrance letrozole.  CT scan Feb 2019-NED.  # Continue Ibrance letrozole. Stable.  We will plan to get a repeat imaging in approximately 2 months.  #Leukopenia/neutropenia-ANC 1.4-continue Ibrance at current dose of 125 mg 3 weeks on 1 week off.  Stable.   # Bil Knee pain-worse.  Follows up with orthopedics documents.  # CAD s/p stenting [[facility]; Dr.Fath]- on anti-platelet therapy; stable  #CKD stage III-creatinine - 1.12; stable  # Follow-up in 2 months/CBC CMP CA 27-29.CT scan C/A/P few days prior.      Orders Placed This Encounter  Procedures  . CT CHEST W CONTRAST    Standing Status:   Future    Standing Expiration Date:   10/05/2018    Order Specific Question:   If indicated for the ordered procedure, I authorize the administration of contrast media per Radiology protocol    Answer:   Yes    Order Specific Question:   Preferred imaging location?  Answer:   [facility]    Order Specific Question:   Radiology Contrast Protocol - do NOT remove file path    Answer:   \\[email or web address]    Order Specific Question:   ** REASON FOR EXAM (FREE TEXT)    Answer:   metastatic breast cancer  . CT Abdomen Pelvis W Contrast    Standing Status:   Future    Standing Expiration Date:   10/04/2018    Order Specific Question:   ** REASON FOR EXAM (FREE TEXT)    Answer:   metastatic breast cancer on treatment    Order Specific Question:   If indicated for the ordered procedure, I authorize the administration of contrast media per Radiology protocol    Answer:   Yes    Order Specific Question:   Preferred imaging location?    Answer:   [facility] [place]    Order Specific Question:   Is Oral Contrast requested for this exam?    Answer:   Yes, Per Radiology protocol    Order Specific Question:   Radiology Contrast Protocol - do NOT remove file path    Answer:   \\[email or web address]  . CBC with Differential/Platelet    Standing Status:   Future    Standing Expiration Date:   10/05/2018  . Comprehensive metabolic panel    Standing Status:   Future    Standing Expiration Date:   10/05/2018  . Cancer antigen 27.29    Standing Status:   Future    Standing Expiration Date:   10/05/2018   All questions were answered. The patient knows to call the clinic with any problems, questions or concerns.      Cammie Sickle, MD 10/04/2017 11:25 AM

## 2017-10-04 NOTE — Assessment & Plan Note (Addendum)
#  Breast cancer stage IV/locally advanced [ER PR positive; ?  HER-2/neu positive-2015]; currently on Ibrance letrozole.  CT scan Feb 2019-NED.  # Continue Ibrance letrozole. Stable.  We will plan to get a repeat imaging in approximately 2 months.  #Leukopenia/neutropenia-ANC 1.4-continue Ibrance at current dose of 125 mg 3 weeks on 1 week off.  Stable.   # Bil Knee pain-worse.  Follows up with orthopedics documents.  # CAD s/p stenting [[facility]; Dr.Fath]- on anti-platelet therapy; stable  #CKD stage III-creatinine - 1.12; stable  # Follow-up in 2 months/CBC CMP CA 27-29.CT scan C/A/P few days prior.

## 2017-10-05 LAB — CANCER ANTIGEN 27.29: CAN 27.29: 10.7 U/mL (ref 0.0–38.6)

## 2017-10-11 MED FILL — IBRANCE 125 MG CAPSULE: 125 | 28 days supply | Qty: 21 | Fill #4

## 2017-11-04 ENCOUNTER — Other Ambulatory Visit: Payer: Self-pay | Admitting: Internal Medicine

## 2017-11-04 DIAGNOSIS — C50812 Malignant neoplasm of overlapping sites of left female breast: Secondary | ICD-10-CM

## 2017-11-04 DIAGNOSIS — Z170 Estrogen receptor positive status [ER+]: Principal | ICD-10-CM

## 2017-11-09 MED FILL — IBRANCE 125 MG CAPSULE: 125 | 28 days supply | Qty: 21 | Fill #0

## 2017-12-01 ENCOUNTER — Ambulatory Visit
Admission: RE | Admit: 2017-12-01 | Discharge: 2017-12-01 | Disposition: A | Discharge status: Home / Self Care | Payer: Medicare Other | Source: Ambulatory Visit | Attending: Internal Medicine | Admitting: Internal Medicine

## 2017-12-01 ENCOUNTER — Inpatient Hospital Stay: Payer: Medicare Other | Attending: Internal Medicine

## 2017-12-01 DIAGNOSIS — M25561 Pain in right knee: Secondary | ICD-10-CM | POA: Insufficient documentation

## 2017-12-01 DIAGNOSIS — D709 Neutropenia, unspecified: Secondary | ICD-10-CM | POA: Diagnosis not present

## 2017-12-01 DIAGNOSIS — Z9013 Acquired absence of bilateral breasts and nipples: Secondary | ICD-10-CM | POA: Diagnosis not present

## 2017-12-01 DIAGNOSIS — C50812 Malignant neoplasm of overlapping sites of left female breast: Secondary | ICD-10-CM | POA: Insufficient documentation

## 2017-12-01 DIAGNOSIS — C773 Secondary and unspecified malignant neoplasm of axilla and upper limb lymph nodes: Secondary | ICD-10-CM | POA: Diagnosis not present

## 2017-12-01 DIAGNOSIS — N183 Chronic kidney disease, stage 3 (moderate): Secondary | ICD-10-CM | POA: Diagnosis not present

## 2017-12-01 DIAGNOSIS — I252 Old myocardial infarction: Secondary | ICD-10-CM | POA: Insufficient documentation

## 2017-12-01 DIAGNOSIS — Z79811 Long term (current) use of aromatase inhibitors: Secondary | ICD-10-CM | POA: Diagnosis not present

## 2017-12-01 DIAGNOSIS — I129 Hypertensive chronic kidney disease with stage 1 through stage 4 chronic kidney disease, or unspecified chronic kidney disease: Secondary | ICD-10-CM | POA: Insufficient documentation

## 2017-12-01 DIAGNOSIS — Z79899 Other long term (current) drug therapy: Secondary | ICD-10-CM | POA: Diagnosis not present

## 2017-12-01 DIAGNOSIS — Z87891 Personal history of nicotine dependence: Secondary | ICD-10-CM | POA: Diagnosis not present

## 2017-12-01 DIAGNOSIS — Z170 Estrogen receptor positive status [ER+]: Secondary | ICD-10-CM | POA: Diagnosis not present

## 2017-12-01 DIAGNOSIS — I2510 Atherosclerotic heart disease of native coronary artery without angina pectoris: Secondary | ICD-10-CM | POA: Insufficient documentation

## 2017-12-01 DIAGNOSIS — Z955 Presence of coronary angioplasty implant and graft: Secondary | ICD-10-CM | POA: Diagnosis not present

## 2017-12-01 DIAGNOSIS — R600 Localized edema: Secondary | ICD-10-CM | POA: Diagnosis not present

## 2017-12-01 DIAGNOSIS — Z9221 Personal history of antineoplastic chemotherapy: Secondary | ICD-10-CM | POA: Diagnosis not present

## 2017-12-01 DIAGNOSIS — M25562 Pain in left knee: Secondary | ICD-10-CM | POA: Diagnosis not present

## 2017-12-01 DIAGNOSIS — I890 Lymphedema, not elsewhere classified: Secondary | ICD-10-CM | POA: Diagnosis not present

## 2017-12-01 DIAGNOSIS — I700 Atherosclerosis of aorta: Secondary | ICD-10-CM | POA: Diagnosis not present

## 2017-12-01 LAB — COMPREHENSIVE METABOLIC PANEL
ALK PHOS: 79 U/L (ref 38–126)
ALT: 41 U/L (ref 0–44)
AST: 55 U/L — AB (ref 15–41)
Albumin: 3.9 g/dL (ref 3.5–5.0)
Anion gap: 9 (ref 5–15)
BUN: 14 mg/dL (ref 8–23)
CALCIUM: 9.6 mg/dL (ref 8.9–10.3)
CHLORIDE: 101 mmol/L (ref 98–111)
CO2: 28 mmol/L (ref 22–32)
CREATININE: 0.99 mg/dL (ref 0.44–1.00)
GFR, EST NON AFRICAN AMERICAN: 55 mL/min — AB (ref >60)
Glucose, Bld: 137 mg/dL — ABNORMAL HIGH (ref 70–99)
Potassium: 4.2 mmol/L (ref 3.5–5.1)
Sodium: 138 mmol/L (ref 135–145)
Total Bilirubin: 0.6 mg/dL (ref 0.3–1.2)
Total Protein: 7.4 g/dL (ref 6.5–8.1)

## 2017-12-01 LAB — CBC WITH DIFFERENTIAL/PLATELET
ABS IMMATURE GRANULOCYTES: 0.01 10*3/uL (ref 0.00–0.07)
BASOS ABS: 0 10*3/uL (ref 0.0–0.1)
BASOS PCT: 1 %
EOS ABS: 0.1 10*3/uL (ref 0.0–0.5)
Eosinophils Relative: 2 %
HCT: 34.8 % — ABNORMAL LOW (ref 36.0–46.0)
Hemoglobin: 12.1 g/dL (ref 12.0–15.0)
IMMATURE GRANULOCYTES: 0 %
Lymphocytes Relative: 26 %
Lymphs Abs: 0.8 10*3/uL (ref 0.7–4.0)
MCH: 35.5 pg — ABNORMAL HIGH (ref 26.0–34.0)
MCHC: 34.8 g/dL (ref 30.0–36.0)
MCV: 102.1 fL — ABNORMAL HIGH (ref 80.0–100.0)
MONOS PCT: 7 %
Monocytes Absolute: 0.2 10*3/uL (ref 0.1–1.0)
NEUTROS PCT: 64 %
NRBC: 0 % (ref 0.0–0.2)
Neutro Abs: 2 10*3/uL (ref 1.7–7.7)
PLATELETS: 188 10*3/uL (ref 150–400)
RBC: 3.41 MIL/uL — ABNORMAL LOW (ref 3.87–5.11)
RDW: 14.1 % (ref 11.5–15.5)
WBC: 3.1 10*3/uL — ABNORMAL LOW (ref 4.0–10.5)

## 2017-12-01 MED ORDER — IOHEXOL 300 MG/ML  SOLN: 100.0000 mL | Freq: Once | INTRAMUSCULAR | Status: DC | PRN

## 2017-12-01 MED ORDER — IOPAMIDOL (ISOVUE-300) INJECTION 61%
100.0000 mL | Freq: Once | INTRAVENOUS | Status: AC | PRN
  Administered 2017-12-01: 100 mL via INTRAVENOUS

## 2017-12-02 LAB — CANCER ANTIGEN 27.29: CA 27.29: 18 U/mL (ref 0.0–38.6)

## 2017-12-05 ENCOUNTER — Telehealth: Payer: Self-pay | Admitting: Pharmacy Technician

## 2017-12-05 NOTE — Telephone Encounter (Signed)
Oral Oncology Patient Advocate Encounter   Was successful in securing patient a $8000 grant from [employer] [place] ([employer]) to provide copayment coverage for Ibrance.  This will keep the out of pocket expense at $0.     I have spoken with the patient.    The billing information is as follows and has been shared with [place].   RxBin: [id number] PCN:  [id number] Member ID: [id number] Group ID: [id number]  Dates of Eligibility: 06/08/17 through 12/06/18  [place] Patient [place] Phone [phone] Fax [phone] 12/05/2017 2:03 PM

## 2017-12-06 ENCOUNTER — Encounter: Payer: Self-pay | Admitting: Internal Medicine

## 2017-12-06 ENCOUNTER — Other Ambulatory Visit: Payer: Medicare Other

## 2017-12-06 ENCOUNTER — Other Ambulatory Visit: Payer: Self-pay

## 2017-12-06 ENCOUNTER — Inpatient Hospital Stay: Payer: Medicare Other | Admitting: Internal Medicine

## 2017-12-06 VITALS — BP 173/78 | HR 83 | Temp 97.9°F | Resp 22 | Ht 66.5 in | Wt 244.7 lb

## 2017-12-06 DIAGNOSIS — Z170 Estrogen receptor positive status [ER+]: Secondary | ICD-10-CM | POA: Diagnosis not present

## 2017-12-06 DIAGNOSIS — Z87891 Personal history of nicotine dependence: Secondary | ICD-10-CM

## 2017-12-06 DIAGNOSIS — Z79899 Other long term (current) drug therapy: Secondary | ICD-10-CM

## 2017-12-06 DIAGNOSIS — N183 Chronic kidney disease, stage 3 (moderate): Secondary | ICD-10-CM

## 2017-12-06 DIAGNOSIS — C773 Secondary and unspecified malignant neoplasm of axilla and upper limb lymph nodes: Secondary | ICD-10-CM | POA: Diagnosis not present

## 2017-12-06 DIAGNOSIS — Z9013 Acquired absence of bilateral breasts and nipples: Secondary | ICD-10-CM

## 2017-12-06 DIAGNOSIS — Z9221 Personal history of antineoplastic chemotherapy: Secondary | ICD-10-CM

## 2017-12-06 DIAGNOSIS — I2510 Atherosclerotic heart disease of native coronary artery without angina pectoris: Secondary | ICD-10-CM

## 2017-12-06 DIAGNOSIS — M25562 Pain in left knee: Secondary | ICD-10-CM

## 2017-12-06 DIAGNOSIS — C50812 Malignant neoplasm of overlapping sites of left female breast: Secondary | ICD-10-CM

## 2017-12-06 DIAGNOSIS — I252 Old myocardial infarction: Secondary | ICD-10-CM

## 2017-12-06 DIAGNOSIS — Z955 Presence of coronary angioplasty implant and graft: Secondary | ICD-10-CM

## 2017-12-06 DIAGNOSIS — I129 Hypertensive chronic kidney disease with stage 1 through stage 4 chronic kidney disease, or unspecified chronic kidney disease: Secondary | ICD-10-CM

## 2017-12-06 DIAGNOSIS — D709 Neutropenia, unspecified: Secondary | ICD-10-CM

## 2017-12-06 DIAGNOSIS — R600 Localized edema: Secondary | ICD-10-CM

## 2017-12-06 DIAGNOSIS — M25561 Pain in right knee: Secondary | ICD-10-CM

## 2017-12-06 DIAGNOSIS — Z79811 Long term (current) use of aromatase inhibitors: Secondary | ICD-10-CM | POA: Diagnosis not present

## 2017-12-06 DIAGNOSIS — I890 Lymphedema, not elsewhere classified: Secondary | ICD-10-CM

## 2017-12-06 MED FILL — IBRANCE 125 MG CAPSULE: 125 | 28 days supply | Qty: 21 | Fill #1

## 2017-12-06 NOTE — Progress Notes (Signed)
[place] OFFICE PROGRESS NOTE  Patient Care Team: Patient, No Pcp Per as PCP - General (General Practice) [name], MD (Inactive) (Unknown Physician Specialty) [name], MD (General Surgery)  Cancer Staging No matching staging information was found for the patient.   Oncology History   1. 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  Biopsy from the lymph node in the left axilla positive for metastatic memory carcinoma Estrogen receptor positive Progesterone receptor +ve hER-2/neu receptors equivocal  by [facility]  IHC for HER-2/neu is 2+ Clinically staged asT4 D. N1 M0 tumor stage IV  locally advanced carcinoma.. 2. She was started on   [place] OF 2015. 3. Treatment was changed to Cytoxan and Adriamycinffrom August 31, 2013  4.patient has finished oral 3 cycles of chemotherapy with Cytoxan and Adriamycin on October 19, 2013. 5.Patient had a bilateral mastectomy in November of 2015. ypT4B ypN3 ypM1 STAGE iv DISEASE.. repeat HER-2/neu receptor by [facility] is still equivocal (November, 2015).  6.  Started on Nov 2015-  letrozole and IBRANCE ; PET may 2017- NED; NOV 1st 2017- NED   # # Acute MI- s/p stenting [[facility]]  # RML 4 mm nodule- 1st NOV 2017- resolved ---------------------------------------------------------   DIAGNOSIS: BREAST CA  STAGE:  IV       ;GOALS: palliative  CURRENT/MOST RECENT THERAPY: Ibrance+ Femara      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 female, estrogen receptor positive ([place])      INTERVAL HISTORY:  Faith [name] 72 y.o.  female pleasant patient above history of metastatic breast cancer ER PR positive ? HER-2/neu positive currently on Ibrance plus Femara is here for follow-up/review the results of the CT scan.  Patient biggest problem is her bilateral knee pain; states that she will likely need  knee replacement sooner.  Chronic leg swelling from lymphedema.  Denies any new shortness of breath or cough.  No bone pain.  No headaches.  No nausea vomiting.  No fevers or chills.  Review of Systems  Constitutional: Negative for chills, diaphoresis, fever, malaise/fatigue and weight loss.  HENT: Negative for nosebleeds and sore throat.   Eyes: Negative for double vision.  Respiratory: Negative for cough, hemoptysis, sputum production, shortness of breath and wheezing.   Cardiovascular: Positive for leg swelling. Negative for chest pain, palpitations and orthopnea.  Gastrointestinal: Negative for abdominal pain, blood in stool, constipation, diarrhea, heartburn, melena, nausea and vomiting.  Genitourinary: Negative for dysuria, frequency and urgency.  Musculoskeletal: Positive for joint pain. Negative for back pain.  Skin: Negative.  Negative for itching and rash.  Neurological: Negative for dizziness, tingling, focal weakness, weakness and headaches.  Endo/Heme/Allergies: Does not bruise/bleed easily.  Psychiatric/Behavioral: Negative for depression. The patient is not nervous/anxious and does not have insomnia.       PAST MEDICAL HISTORY :  Past Medical History:  Diagnosis Date  . Cancer [facility]) April 2015   left breast  . Hypertension   . Myocardial infarction ([place]) 11/03/2015   [facility], stent placed    PAST SURGICAL HISTORY :   Past Surgical History:  Procedure Laterality Date  . BREAST SURGERY Bilateral 11/19/13   bilater mastectomy   . PORTACATH PLACEMENT  06-07-13  . SHOULDER OPEN ROTATOR CUFF REPAIR  2012  . WRIST SURGERY Left 1969   cyst    FAMILY HISTORY :   Family History  Adopted: Yes    SOCIAL HISTORY:   Social History   Tobacco Use  . Smoking status: Former Smoker    Packs/day: 1.00    Years: 15.00    Pack years: 15.00    Types: Cigarettes    Last attempt to quit: 06/12/1985    Years since quitting: 32.5  . Smokeless tobacco: Never Used  Substance Use  Topics  . Alcohol use: Yes    Alcohol/week: 0.0 standard drinks  . Drug use: No    ALLERGIES:  is allergic to mushroom extract complex and shellfish allergy.  MEDICATIONS:  Current Outpatient Medications  Medication Sig Dispense Refill  . acetaminophen (TYLENOL) 500 MG tablet Take 500 mg by mouth every 6 (six) hours as needed.    Marland Kitchen atorvastatin (LIPITOR) 80 MG tablet TAKE 1 TABLET BY MOUTH  NIGHTLY    . Calcium Carbonate-Vitamin D (CALCIUM + D PO) Take 1 tablet by mouth daily.    . Cholecalciferol (VITAMIN D PO) Take 5,000 Units by mouth daily.    [name] 125 MG capsule TAKE 1 CAPSULE (125 MG TOTAL) BY MOUTH DAILY WITH BREAKFAST. TAKE FOR 3 WEEKS, THEN 1 WEEK OFF. 21 capsule 4  . letrozole (FEMARA) 2.5 MG tablet TAKE 1 TABLET BY MOUTH  DAILY 90 tablet 1  . losartan (COZAAR) 25 MG tablet Take 25 mg by mouth daily.    . metoprolol tartrate (LOPRESSOR) 25 MG tablet Take 25 mg by mouth 2 (two) times daily.     . ondansetron (ZOFRAN-ODT) 8 MG disintegrating tablet Take by mouth every 8 (eight) hours as needed.     . nitroGLYCERIN (NITROSTAT) 0.4 MG SL tablet Place under the tongue.     No current facility-administered medications for this visit.    Facility-Administered Medications Ordered in Other Visits  Medication Dose Route Frequency Provider Last Rate Last Dose  . sodium chloride 0.9 % injection 10 mL  10 mL Intravenous PRN [name], MD   10 mL at 09/12/14 1014    PHYSICAL EXAMINATION: ECOG PERFORMANCE STATUS: 0 - Asymptomatic  BP (!) 173/78 (BP Location: Right Wrist, Patient Position: Sitting)   Pulse 83   Temp 97.9 F (36.6 C) (Tympanic)   Resp (!) 22   Ht 5' 6.5" (1.689 m)   Wt 244 lb 11.4 oz (111 kg)   BMI 38.91 kg/m   Filed Weights   12/06/17 0934  Weight: 244 lb 11.4 oz (111 kg)    Physical Exam  Constitutional: She is oriented to person, place, and time and well-developed, well-nourished, and in no distress.  Obese.  Alone.  HENT:  Head: Normocephalic  and atraumatic.  Mouth/Throat: Oropharynx is clear and moist. No oropharyngeal exudate.  Eyes: Pupils are equal, round, and reactive to light.  Neck: Normal range of motion. Neck supple.  Cardiovascular: Normal rate and regular rhythm.  Pulmonary/Chest: No respiratory distress. She has no wheezes.  Abdominal: Soft. Bowel sounds are normal. She exhibits no distension and no mass. There is no tenderness. There is no rebound and no guarding.  Musculoskeletal: Normal range of motion. She exhibits no edema or tenderness.  Bilateral lower extremity chronic lymphedema.  Neurological: She is alert and oriented to person, place, and time.  Skin: Skin is warm.  Bilateral mastectomy incisions noted.  Well-healing.  No lumps or bumps.  Psychiatric: Affect normal.       LABORATORY DATA:  I have reviewed the data as listed    Component Value Date/Time   NA 138 12/01/2017  0945   NA 137 05/16/2014 1154   K 4.2 12/01/2017 0945   K 3.9 05/16/2014 1154   CL 101 12/01/2017 0945   CL 104 05/16/2014 1154   CO2 28 12/01/2017 0945   CO2 25 05/16/2014 1154   GLUCOSE 137 (H) 12/01/2017 0945   GLUCOSE 110 (H) 05/16/2014 1154   BUN 14 12/01/2017 0945   BUN 20 05/16/2014 1154   CREATININE 0.99 12/01/2017 0945   CREATININE 1.00 05/16/2014 1154   CALCIUM 9.6 12/01/2017 0945   CALCIUM 9.6 05/16/2014 1154   PROT 7.4 12/01/2017 0945   PROT 7.4 05/16/2014 1154   ALBUMIN 3.9 12/01/2017 0945   ALBUMIN 4.0 05/16/2014 1154   AST 55 (H) 12/01/2017 0945   AST 26 05/16/2014 1154   ALT 41 12/01/2017 0945   ALT 37 05/16/2014 1154   ALKPHOS 79 12/01/2017 0945   ALKPHOS 72 05/16/2014 1154   BILITOT 0.6 12/01/2017 0945   BILITOT 0.5 05/16/2014 1154   GFRNONAA 55 (L) 12/01/2017 0945   GFRNONAA 57 (L) 05/16/2014 1154   GFRAA >60 12/01/2017 0945   GFRAA >60 05/16/2014 1154    No results found for: SPEP, UPEP  Lab Results  Component Value Date   WBC 3.1 (L) 12/01/2017   NEUTROABS 2.0 12/01/2017   HGB 12.1  12/01/2017   HCT 34.8 (L) 12/01/2017   MCV 102.1 (H) 12/01/2017   PLT 188 12/01/2017      Chemistry      Component Value Date/Time   NA 138 12/01/2017 0945   NA 137 05/16/2014 1154   K 4.2 12/01/2017 0945   K 3.9 05/16/2014 1154   CL 101 12/01/2017 0945   CL 104 05/16/2014 1154   CO2 28 12/01/2017 0945   CO2 25 05/16/2014 1154   BUN 14 12/01/2017 0945   BUN 20 05/16/2014 1154   CREATININE 0.99 12/01/2017 0945   CREATININE 1.00 05/16/2014 1154      Component Value Date/Time   CALCIUM 9.6 12/01/2017 0945   CALCIUM 9.6 05/16/2014 1154   ALKPHOS 79 12/01/2017 0945   ALKPHOS 72 05/16/2014 1154   AST 55 (H) 12/01/2017 0945   AST 26 05/16/2014 1154   ALT 41 12/01/2017 0945   ALT 37 05/16/2014 1154   BILITOT 0.6 12/01/2017 0945   BILITOT 0.5 05/16/2014 1154       RADIOGRAPHIC STUDIES: I have personally reviewed the radiological images as listed and agreed with the findings in the report. No results found.   ASSESSMENT & PLAN:  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 female, estrogen receptor positive ([place]) # Breast cancer stage IV/locally advanced [ER PR positive; ?  HER-2/neu positive-2015]; currently on Ibrance letrozole.  CT scan NOV 2019- NED;   # Continue Ibrance letrozole. Labs today reviewed;  acceptable for treatment today.   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  STABLE.   # Bil Knee pain [Dr.Menz]- possible knee replacement. She will inform us re: holding ibrance prior to surgery.   # CAD s/p stenting [facility]; Dr.Fath]- on baby asprin.  STABLE.   #CKD stage III-creatinine - STABLE.   # DISPOSITION:  #  Follow-up in 2 months/CBC CMP CA 27-29.   No orders of the defined types were placed in this encounter.  All questions were answered. The patient knows to call the clinic with any problems, questions or concerns.      Cammie Sickle, MD 12/06/2017 12:27 PM

## 2017-12-06 NOTE — Assessment & Plan Note (Addendum)
#  Breast cancer stage IV/locally advanced [ER PR positive; ?  HER-2/neu positive-2015]; currently on Ibrance letrozole.  CT scan NOV 2019- NED;   # Continue Ibrance letrozole. Labs today reviewed;  acceptable for treatment today.   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  STABLE.   # Bil Knee pain [Dr.Menz]- possible knee replacement. She will inform us re: holding ibrance prior to surgery.   # CAD s/p stenting [facility]; Dr.Fath]- on baby asprin.  STABLE.   #CKD stage III-creatinine - STABLE.   # DISPOSITION:  #  Follow-up in 2 months/CBC CMP CA 27-29.

## 2018-01-03 MED FILL — IBRANCE 125 MG CAPSULE: 125 | 28 days supply | Qty: 21 | Fill #2

## 2018-01-31 ENCOUNTER — Other Ambulatory Visit: Payer: Self-pay

## 2018-01-31 DIAGNOSIS — Z170 Estrogen receptor positive status [ER+]: Principal | ICD-10-CM

## 2018-01-31 DIAGNOSIS — C50812 Malignant neoplasm of overlapping sites of left female breast: Secondary | ICD-10-CM

## 2018-01-31 MED FILL — IBRANCE 125 MG CAPSULE: 125 | 28 days supply | Qty: 21 | Fill #3

## 2018-02-06 ENCOUNTER — Other Ambulatory Visit: Payer: Self-pay | Admitting: Internal Medicine

## 2018-02-06 DIAGNOSIS — C50912 Malignant neoplasm of unspecified site of left female breast: Secondary | ICD-10-CM

## 2018-02-06 NOTE — Telephone Encounter (Signed)
...   Ref Range & Units 33moago  Sodium 135 - 145 mmol/L 138   Potassium 3.5 - 5.1 mmol/L 4.2   Chloride 98 - 111 mmol/L 101   CO2 22 - 32 mmol/L 28   Glucose, Bld 70 - 99 mg/dL 137High    BUN 8 - 23 mg/dL 14   Creatinine, Ser 0.44 - 1.00 mg/dL 0.99   Calcium 8.9 - 10.3 mg/dL 9.6   Total Protein 6.5 - 8.1 g/dL 7.4   Albumin 3.5 - 5.0 g/dL 3.9   AST 15 - 41 U/L 55High    ALT 0 - 44 U/L 41   Alkaline Phosphatase 38 - 126 U/L 79   Total Bilirubin 0.3 - 1.2 mg/dL 0.6   GFR calc non Af Amer >60 mL/min 55Low    GFR calc Af Amer >60 mL/min >60   Comment: (NOTE)  The eGFR has been calculated using the CKD EPI equation.  This calculation has not been validated in all clinical situations.  eGFR's persistently <60 mL/min signify possible Chronic Kidney  Disease.   Anion gap 5 - 15 9   Comment: [facility]formed at [facility] [address] [place] [address] Resulting Agency  [facility]      Specimen Collected: 12/01/17 09:45 Last Resulted: 12/01/17 10:09     Lab Flowsheet    Order Details    View Encounter    Lab and Collection Details    Routing    Result History          Other Results from 12/01/2017   Cancer antigen 27.29  Order: [id number]  Status:  Final result  Visible to patient:  Yes (MyChart)  Next appt:  02/07/2018 at 08:15 AM in Oncology ([facility])  Dx:  Carcinoma of overlapping sites of lef...   Ref Range & Units 215mogo  CA 27.29 0.0 - 38.6 U/mL 18.0   Comment: (NOTE)  [employer] Immunochemiluminometric Methodology ([facility] Values obtained with different assay methods or kits cannot be used  interchangeably. Results cannot be interpreted as absolute evidence  of the presence or absence of malignant disease.  [facility] At[id number] Farmer[place] PhUD:[phone] Resulting Agency  [facility]      Specimen Collected: 12/01/17 09:45 Last Resulted: 12/02/17 02:35      Lab Flowsheet    Order Details    View Encounter    Lab and Collection Details    Routing    Result History            Contains abnormal data CBC with Differential/Platelet  Order: [id number] Status:  Final result  Visible to patient:  Yes (MyChart)  Next appt:  02/07/2018 at 08:15 AM in Oncology ([facility])  Dx:  Carcinoma of overlapping sites of lef...   Ref Range & Units 25m79moo  WBC 4.0 - 10.5 K/uL 3.1Low    RBC 3.87 - 5.11 MIL/uL 3.41Low    Hemoglobin 12.0 - 15.0 g/dL 12.1   HCT 36.0 - 46.0 % 34.8Low    MCV 80.0 - 100.0 fL 102.1High    MCH 26.0 - 34.0 pg 35.5High    MCHC 30.0 - 36.0 g/dL 34.8   RDW 11.5 - 15.5 % 14.1   Platelets 150 - 400 K/uL 188   nRBC 0.0 - 0.2 % 0.0   Neutrophils Relative % % 64   Neutro Abs  1.7 - 7.7 K/uL 2.0   Lymphocytes Relative % 26   Lymphs Abs 0.7 - 4.0 K/uL 0.8   Monocytes Relative % 7   Monocytes Absolute 0.1 - 1.0 K/uL 0.2   Eosinophils Relative % 2   Eosinophils Absolute 0.0 - 0.5 K/uL 0.1   Basophils Relative % 1   Basophils Absolute 0.0 - 0.1 K/uL 0.0   Immature Granulocytes % 0   Abs Immature Granulocytes 0.00 - 0.07 K/uL 0.01   Comment: Performed at [facility], [address] [place], [place] [address]  Resulting Agency  [facility] CLIN LAB      Specimen Collected: 12/01/17 09:45 Last Resulted: 12/01/17 10:55

## 2018-02-07 ENCOUNTER — Encounter: Payer: Self-pay | Admitting: Internal Medicine

## 2018-02-07 ENCOUNTER — Inpatient Hospital Stay: Payer: Medicare Other | Attending: Internal Medicine | Admitting: Internal Medicine

## 2018-02-07 ENCOUNTER — Inpatient Hospital Stay: Payer: Medicare Other

## 2018-02-07 DIAGNOSIS — Z79811 Long term (current) use of aromatase inhibitors: Secondary | ICD-10-CM

## 2018-02-07 DIAGNOSIS — C50912 Malignant neoplasm of unspecified site of left female breast: Secondary | ICD-10-CM

## 2018-02-07 DIAGNOSIS — Z170 Estrogen receptor positive status [ER+]: Secondary | ICD-10-CM | POA: Insufficient documentation

## 2018-02-07 DIAGNOSIS — I252 Old myocardial infarction: Secondary | ICD-10-CM | POA: Diagnosis not present

## 2018-02-07 DIAGNOSIS — Z87891 Personal history of nicotine dependence: Secondary | ICD-10-CM | POA: Diagnosis not present

## 2018-02-07 DIAGNOSIS — I890 Lymphedema, not elsewhere classified: Secondary | ICD-10-CM | POA: Diagnosis not present

## 2018-02-07 DIAGNOSIS — C50812 Malignant neoplasm of overlapping sites of left female breast: Secondary | ICD-10-CM | POA: Insufficient documentation

## 2018-02-07 DIAGNOSIS — Z79899 Other long term (current) drug therapy: Secondary | ICD-10-CM | POA: Diagnosis not present

## 2018-02-07 DIAGNOSIS — I10 Essential (primary) hypertension: Secondary | ICD-10-CM | POA: Diagnosis not present

## 2018-02-07 DIAGNOSIS — M25561 Pain in right knee: Secondary | ICD-10-CM | POA: Diagnosis not present

## 2018-02-07 DIAGNOSIS — Z9221 Personal history of antineoplastic chemotherapy: Secondary | ICD-10-CM | POA: Diagnosis not present

## 2018-02-07 DIAGNOSIS — D709 Neutropenia, unspecified: Secondary | ICD-10-CM | POA: Diagnosis not present

## 2018-02-07 DIAGNOSIS — Z9013 Acquired absence of bilateral breasts and nipples: Secondary | ICD-10-CM | POA: Diagnosis not present

## 2018-02-07 DIAGNOSIS — M25562 Pain in left knee: Secondary | ICD-10-CM | POA: Insufficient documentation

## 2018-02-07 LAB — CBC WITH DIFFERENTIAL/PLATELET
Abs Immature Granulocytes: 0.01 10*3/uL (ref 0.00–0.07)
Basophils Absolute: 0.1 10*3/uL (ref 0.0–0.1)
Basophils Relative: 2 %
Eosinophils Absolute: 0.1 10*3/uL (ref 0.0–0.5)
Eosinophils Relative: 3 %
HCT: 34.2 % — ABNORMAL LOW (ref 36.0–46.0)
Hemoglobin: 12 g/dL (ref 12.0–15.0)
Immature Granulocytes: 0 %
Lymphocytes Relative: 33 %
Lymphs Abs: 1.1 10*3/uL (ref 0.7–4.0)
MCH: 36 pg — ABNORMAL HIGH (ref 26.0–34.0)
MCHC: 35.1 g/dL (ref 30.0–36.0)
MCV: 102.7 fL — ABNORMAL HIGH (ref 80.0–100.0)
Monocytes Absolute: 0.5 10*3/uL (ref 0.1–1.0)
Monocytes Relative: 16 %
Neutro Abs: 1.5 10*3/uL — ABNORMAL LOW (ref 1.7–7.7)
Neutrophils Relative %: 46 %
Platelets: 193 10*3/uL (ref 150–400)
RBC: 3.33 MIL/uL — ABNORMAL LOW (ref 3.87–5.11)
RDW: 14 % (ref 11.5–15.5)
WBC: 3.3 10*3/uL — ABNORMAL LOW (ref 4.0–10.5)
nRBC: 0 % (ref 0.0–0.2)

## 2018-02-07 LAB — COMPREHENSIVE METABOLIC PANEL
ALT: 43 U/L (ref 0–44)
AST: 50 U/L — ABNORMAL HIGH (ref 15–41)
Albumin: 3.8 g/dL (ref 3.5–5.0)
Alkaline Phosphatase: 81 U/L (ref 38–126)
Anion gap: 12 (ref 5–15)
BUN: 25 mg/dL — ABNORMAL HIGH (ref 8–23)
CO2: 25 mmol/L (ref 22–32)
Calcium: 9.6 mg/dL (ref 8.9–10.3)
Chloride: 104 mmol/L (ref 98–111)
Creatinine, Ser: 1.12 mg/dL — ABNORMAL HIGH (ref 0.44–1.00)
GFR calc Af Amer: 56 mL/min — ABNORMAL LOW (ref >60)
GFR calc non Af Amer: 48 mL/min — ABNORMAL LOW (ref >60)
Glucose, Bld: 154 mg/dL — ABNORMAL HIGH (ref 70–99)
POTASSIUM: 3.9 mmol/L (ref 3.5–5.1)
Sodium: 141 mmol/L (ref 135–145)
Total Bilirubin: 0.7 mg/dL (ref 0.3–1.2)
Total Protein: 6.9 g/dL (ref 6.5–8.1)

## 2018-02-07 MED ORDER — LETROZOLE 2.5 MG PO TABS: 2.5000 mg | ORAL_TABLET | Freq: Every day | ORAL | 1 refills | Status: DC

## 2018-02-07 NOTE — Addendum Note (Signed)
Addended by: Sandria Bales B on: 02/07/2018 09:24 AM   Modules accepted: Orders

## 2018-02-07 NOTE — Progress Notes (Signed)
[place] OFFICE PROGRESS NOTE  Patient Care Team: Patient, No Pcp Per as PCP - General (General Practice) [name], MD (Inactive) (Unknown Physician Specialty) [name], MD (General Surgery)  Cancer Staging No matching staging information was found for the patient.   Oncology History   1. 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  Biopsy from the lymph node in the left axilla positive for metastatic memory carcinoma Estrogen receptor positive Progesterone receptor +ve hER-2/neu receptors equivocal  by [facility]  IHC for HER-2/neu is 2+ Clinically staged asT4 D. N1 M0 tumor stage IV  locally advanced carcinoma.. 2. She was started on   [place] OF 2015. 3. Treatment was changed to Cytoxan and Adriamycinffrom August 31, 2013  4.patient has finished oral 3 cycles of chemotherapy with Cytoxan and Adriamycin on October 19, 2013. 5.Patient had a bilateral mastectomy in November of 2015. ypT4B ypN3 ypM1 STAGE iv DISEASE.. repeat HER-2/neu receptor by [facility] is still equivocal (November, 2015).  6.  Started on Nov 2015-  letrozole and IBRANCE ; PET may 2017- NED; NOV 1st 2017- NED   # # Acute MI- s/p stenting [[facility]]  # RML 4 mm nodule- 1st NOV 2017- resolved ---------------------------------------------------------   DIAGNOSIS: BREAST CA  STAGE:  IV       ;GOALS: palliative  CURRENT/MOST RECENT THERAPY: Ibrance+ Femara      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 female, estrogen receptor positive ([place])      INTERVAL HISTORY:  Faith [name] 73 y.o.  female pleasant patient above history of metastatic breast cancer ER PR positive ? HER-2/neu positive currently on Ibrance plus Femara is here for follow-up.  Patient denies any diarrhea.  No skin rash.   Complains of continued bilateral knee pain.  She is awaiting to see  orthopedics on February 5 to discuss knee replacement.  She has chronic leg swelling from lymphedema.  Denies any new shortness of breath or cough.  No bone pain.  No headaches.  No nausea vomiting.  No fevers or chills.  Review of Systems  Constitutional: Negative for chills, diaphoresis, fever, malaise/fatigue and weight loss.  HENT: Negative for nosebleeds and sore throat.   Eyes: Negative for double vision.  Respiratory: Negative for cough, hemoptysis, sputum production, shortness of breath and wheezing.   Cardiovascular: Positive for leg swelling. Negative for chest pain, palpitations and orthopnea.  Gastrointestinal: Negative for abdominal pain, blood in stool, constipation, diarrhea, heartburn, melena, nausea and vomiting.  Genitourinary: Negative for dysuria, frequency and urgency.  Musculoskeletal: Positive for joint pain. Negative for back pain.  Skin: Negative.  Negative for itching and rash.  Neurological: Negative for dizziness, tingling, focal weakness, weakness and headaches.  Endo/Heme/Allergies: Does not bruise/bleed easily.  Psychiatric/Behavioral: Negative for depression. The patient is not nervous/anxious and does not have insomnia.       PAST MEDICAL HISTORY :  Past Medical History:  Diagnosis Date  . Cancer [facility]) April 2015   left breast  . Hypertension   . Myocardial infarction ([place]) 11/03/2015   [facility], stent placed    PAST SURGICAL HISTORY :   Past Surgical History:  Procedure Laterality Date  . BREAST SURGERY Bilateral 11/19/13   bilater mastectomy   . PORTACATH PLACEMENT  06-07-13  . SHOULDER OPEN ROTATOR CUFF REPAIR  2012  . WRIST SURGERY Left 1969   cyst  FAMILY HISTORY :   Family History  Adopted: Yes    SOCIAL HISTORY:   Social History   Tobacco Use  . Smoking status: Former Smoker    Packs/day: 1.00    Years: 15.00    Pack years: 15.00    Types: Cigarettes    Last attempt to quit: 06/12/1985    Years since quitting: 32.6  .  Smokeless tobacco: Never Used  Substance Use Topics  . Alcohol use: Yes    Alcohol/week: 0.0 standard drinks  . Drug use: No    ALLERGIES:  is allergic to mushroom extract complex and shellfish allergy.  MEDICATIONS:  Current Outpatient Medications  Medication Sig Dispense Refill  . acetaminophen (TYLENOL) 500 MG tablet Take 500 mg by mouth every 6 (six) hours as needed.    Marland Kitchen atorvastatin (LIPITOR) 80 MG tablet TAKE 1 TABLET BY MOUTH  NIGHTLY    . Calcium Carbonate-Vitamin D (CALCIUM + D PO) Take 1 tablet by mouth daily.    . Cholecalciferol (VITAMIN D PO) Take 5,000 Units by mouth daily.    [name] 125 MG capsule TAKE 1 CAPSULE (125 MG TOTAL) BY MOUTH DAILY WITH BREAKFAST. TAKE FOR 3 WEEKS, THEN 1 WEEK OFF. 21 capsule 4  . letrozole (FEMARA) 2.5 MG tablet Take 1 tablet (2.5 mg total) by mouth daily. 90 tablet 1  . losartan (COZAAR) 25 MG tablet Take 25 mg by mouth daily.    . metoprolol tartrate (LOPRESSOR) 25 MG tablet Take 25 mg by mouth 2 (two) times daily.     . ondansetron (ZOFRAN-ODT) 8 MG disintegrating tablet Take by mouth every 8 (eight) hours as needed.     . nitroGLYCERIN (NITROSTAT) 0.4 MG SL tablet Place under the tongue.     No current facility-administered medications for this visit.    Facility-Administered Medications Ordered in Other Visits  Medication Dose Route Frequency Provider Last Rate Last Dose  . sodium chloride 0.9 % injection 10 mL  10 mL Intravenous PRN [name], MD   10 mL at 09/12/14 1014    PHYSICAL EXAMINATION: ECOG PERFORMANCE STATUS: 0 - Asymptomatic  BP (!) 193/73 (BP Location: Left Arm, Patient Position: Sitting, Cuff Size: Normal)   Pulse 84   Temp 97.6 F (36.4 C) (Tympanic)   Resp 16   Wt 248 lb 5.6 oz (112.6 kg)   BMI 39.48 kg/m   Filed Weights   02/07/18 0841  Weight: 248 lb 5.6 oz (112.6 kg)    Physical Exam  Constitutional: She is oriented to person, place, and time and well-developed, well-nourished, and in no  distress.  Obese.  Alone.  HENT:  Head: Normocephalic and atraumatic.  Mouth/Throat: Oropharynx is clear and moist. No oropharyngeal exudate.  Eyes: Pupils are equal, round, and reactive to light.  Neck: Normal range of motion. Neck supple.  Cardiovascular: Normal rate and regular rhythm.  Pulmonary/Chest: No respiratory distress. She has no wheezes.  Abdominal: Soft. Bowel sounds are normal. She exhibits no distension and no mass. There is no abdominal tenderness. There is no rebound and no guarding.  Musculoskeletal: Normal range of motion.        General: No tenderness or edema.     Comments: Bilateral lower extremity chronic lymphedema.  Neurological: She is alert and oriented to person, place, and time.  Skin: Skin is warm.  Bilateral mastectomy incisions noted.  Well-healing.  No lumps or bumps.  Psychiatric: Affect normal.       LABORATORY DATA:  I have  reviewed the data as listed    Component Value Date/Time   NA 141 02/07/2018 0820   NA 137 05/16/2014 1154   K 3.9 02/07/2018 0820   K 3.9 05/16/2014 1154   CL 104 02/07/2018 0820   CL 104 05/16/2014 1154   CO2 25 02/07/2018 0820   CO2 25 05/16/2014 1154   GLUCOSE 154 (H) 02/07/2018 0820   GLUCOSE 110 (H) 05/16/2014 1154   BUN 25 (H) 02/07/2018 0820   BUN 20 05/16/2014 1154   CREATININE 1.12 (H) 02/07/2018 0820   CREATININE 1.00 05/16/2014 1154   CALCIUM 9.6 02/07/2018 0820   CALCIUM 9.6 05/16/2014 1154   PROT 6.9 02/07/2018 0820   PROT 7.4 05/16/2014 1154   ALBUMIN 3.8 02/07/2018 0820   ALBUMIN 4.0 05/16/2014 1154   AST 50 (H) 02/07/2018 0820   AST 26 05/16/2014 1154   ALT 43 02/07/2018 0820   ALT 37 05/16/2014 1154   ALKPHOS 81 02/07/2018 0820   ALKPHOS 72 05/16/2014 1154   BILITOT 0.7 02/07/2018 0820   BILITOT 0.5 05/16/2014 1154   GFRNONAA 48 (L) 02/07/2018 0820   GFRNONAA 57 (L) 05/16/2014 1154   GFRAA 56 (L) 02/07/2018 0820   GFRAA >60 05/16/2014 1154    No results found for: SPEP, UPEP  Lab  Results  Component Value Date   WBC 3.3 (L) 02/07/2018   NEUTROABS 1.5 (L) 02/07/2018   HGB 12.0 02/07/2018   HCT 34.2 (L) 02/07/2018   MCV 102.7 (H) 02/07/2018   PLT 193 02/07/2018      Chemistry      Component Value Date/Time   NA 141 02/07/2018 0820   NA 137 05/16/2014 1154   K 3.9 02/07/2018 0820   K 3.9 05/16/2014 1154   CL 104 02/07/2018 0820   CL 104 05/16/2014 1154   CO2 25 02/07/2018 0820   CO2 25 05/16/2014 1154   BUN 25 (H) 02/07/2018 0820   BUN 20 05/16/2014 1154   CREATININE 1.12 (H) 02/07/2018 0820   CREATININE 1.00 05/16/2014 1154      Component Value Date/Time   CALCIUM 9.6 02/07/2018 0820   CALCIUM 9.6 05/16/2014 1154   ALKPHOS 81 02/07/2018 0820   ALKPHOS 72 05/16/2014 1154   AST 50 (H) 02/07/2018 0820   AST 26 05/16/2014 1154   ALT 43 02/07/2018 0820   ALT 37 05/16/2014 1154   BILITOT 0.7 02/07/2018 0820   BILITOT 0.5 05/16/2014 1154       RADIOGRAPHIC STUDIES: I have personally reviewed the radiological images as listed and agreed with the findings in the report. No results found.   ASSESSMENT & PLAN:  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 female, estrogen receptor positive ([place]) # Breast cancer stage IV/locally advanced [ER PR positive; ?  HER-2/neu positive-2015]; currently on Ibrance letrozole.  CT scan NOV 2019- NED;  Stable.   # Continue Ibrance letrozole. Labs today reviewed;  acceptable for treatment today.   #Leukopenia/neutropenia-ANC1.5; continue Ibrance at current dose of 125 mg 3 weeks on 1 week off.  Stable.   # Bil Knee pain [Dr.Menz]- possible knee replacement [appt on 5th]. She will inform us re: holding ibrance prior to surgery.   # CAD s/p stenting [facility]; Dr.Fath]- on baby asprin.  Stable.   # HTN-  Better at home 130s/   # DISPOSITION:  #  Follow-up in 1 month-Dr.C-/CBC CMP CA 27-29.   No orders of the defined types were placed in this encounter.  All questions were  answered. The patient knows to call  the clinic with any problems, questions or concerns.      Cammie Sickle, MD 02/07/2018 9:03 AM

## 2018-02-07 NOTE — Assessment & Plan Note (Addendum)
#  Breast cancer stage IV/locally advanced [ER PR positive; ?  HER-2/neu positive-2015]; currently on Ibrance letrozole.  CT scan NOV 2019- NED;  Stable.   # Continue Ibrance letrozole. Labs today reviewed;  acceptable for treatment today.   #Leukopenia/neutropenia-ANC1.5; continue Ibrance at current dose of 125 mg 3 weeks on 1 week off.  Stable.   # Bil Knee pain [Dr.Menz]- possible knee replacement [appt on 5th]. She will inform us re: holding ibrance prior to surgery.   # CAD s/p stenting [facility]; Dr.Fath]- on baby asprin.  Stable.   # HTN-  Better at home 130s/   # DISPOSITION:  #  Follow-up in 1 month-Dr.C-/CBC CMP CA 27-29.

## 2018-02-08 LAB — CANCER ANTIGEN 27.29: CA 27.29: 12.1 U/mL (ref 0.0–38.6)

## 2018-02-22 ENCOUNTER — Other Ambulatory Visit: Payer: Self-pay | Admitting: Orthopedic Surgery

## 2018-02-22 ENCOUNTER — Encounter: Payer: Self-pay | Admitting: Internal Medicine

## 2018-02-22 DIAGNOSIS — G8929 Other chronic pain: Secondary | ICD-10-CM

## 2018-02-22 DIAGNOSIS — M25562 Pain in left knee: Principal | ICD-10-CM

## 2018-02-22 DIAGNOSIS — M1712 Unilateral primary osteoarthritis, left knee: Secondary | ICD-10-CM

## 2018-02-24 ENCOUNTER — Ambulatory Visit
Admission: RE | Admit: 2018-02-24 | Discharge: 2018-02-24 | Disposition: A | Discharge status: Home / Self Care | Payer: Medicare Other | Source: Ambulatory Visit | Attending: Orthopedic Surgery | Admitting: Orthopedic Surgery

## 2018-02-24 DIAGNOSIS — M1712 Unilateral primary osteoarthritis, left knee: Secondary | ICD-10-CM | POA: Diagnosis present

## 2018-02-24 DIAGNOSIS — G8929 Other chronic pain: Secondary | ICD-10-CM | POA: Insufficient documentation

## 2018-02-24 DIAGNOSIS — M25562 Pain in left knee: Secondary | ICD-10-CM | POA: Diagnosis not present

## 2018-02-28 ENCOUNTER — Inpatient Hospital Stay: Payer: Medicare Other | Attending: Hematology and Oncology

## 2018-02-28 DIAGNOSIS — Z7901 Long term (current) use of anticoagulants: Secondary | ICD-10-CM | POA: Insufficient documentation

## 2018-02-28 DIAGNOSIS — C50812 Malignant neoplasm of overlapping sites of left female breast: Secondary | ICD-10-CM | POA: Insufficient documentation

## 2018-02-28 DIAGNOSIS — I252 Old myocardial infarction: Secondary | ICD-10-CM | POA: Insufficient documentation

## 2018-02-28 DIAGNOSIS — Z9013 Acquired absence of bilateral breasts and nipples: Secondary | ICD-10-CM | POA: Diagnosis not present

## 2018-02-28 DIAGNOSIS — Z79811 Long term (current) use of aromatase inhibitors: Secondary | ICD-10-CM | POA: Diagnosis not present

## 2018-02-28 DIAGNOSIS — Z79899 Other long term (current) drug therapy: Secondary | ICD-10-CM | POA: Diagnosis not present

## 2018-02-28 DIAGNOSIS — C773 Secondary and unspecified malignant neoplasm of axilla and upper limb lymph nodes: Secondary | ICD-10-CM | POA: Diagnosis not present

## 2018-02-28 DIAGNOSIS — M170 Bilateral primary osteoarthritis of knee: Secondary | ICD-10-CM | POA: Diagnosis not present

## 2018-02-28 DIAGNOSIS — I10 Essential (primary) hypertension: Secondary | ICD-10-CM | POA: Insufficient documentation

## 2018-02-28 DIAGNOSIS — Z170 Estrogen receptor positive status [ER+]: Secondary | ICD-10-CM | POA: Diagnosis not present

## 2018-02-28 DIAGNOSIS — N951 Menopausal and female climacteric states: Secondary | ICD-10-CM | POA: Insufficient documentation

## 2018-02-28 DIAGNOSIS — Z87891 Personal history of nicotine dependence: Secondary | ICD-10-CM | POA: Insufficient documentation

## 2018-02-28 LAB — CBC WITH DIFFERENTIAL/PLATELET
ABS IMMATURE GRANULOCYTES: 0.01 10*3/uL (ref 0.00–0.07)
Basophils Absolute: 0.1 10*3/uL (ref 0.0–0.1)
Basophils Relative: 2 %
Eosinophils Absolute: 0.1 10*3/uL (ref 0.0–0.5)
Eosinophils Relative: 4 %
HCT: 33.5 % — ABNORMAL LOW (ref 36.0–46.0)
Hemoglobin: 11.6 g/dL — ABNORMAL LOW (ref 12.0–15.0)
Immature Granulocytes: 0 %
LYMPHS ABS: 0.7 10*3/uL (ref 0.7–4.0)
Lymphocytes Relative: 29 %
MCH: 35.6 pg — ABNORMAL HIGH (ref 26.0–34.0)
MCHC: 34.6 g/dL (ref 30.0–36.0)
MCV: 102.8 fL — AB (ref 80.0–100.0)
MONOS PCT: 7 %
Monocytes Absolute: 0.2 10*3/uL (ref 0.1–1.0)
Neutro Abs: 1.4 10*3/uL — ABNORMAL LOW (ref 1.7–7.7)
Neutrophils Relative %: 58 %
Platelets: 172 10*3/uL (ref 150–400)
RBC: 3.26 MIL/uL — ABNORMAL LOW (ref 3.87–5.11)
RDW: 13.4 % (ref 11.5–15.5)
WBC: 2.4 10*3/uL — ABNORMAL LOW (ref 4.0–10.5)
nRBC: 0 % (ref 0.0–0.2)

## 2018-02-28 LAB — COMPREHENSIVE METABOLIC PANEL
ALT: 40 U/L (ref 0–44)
AST: 46 U/L — ABNORMAL HIGH (ref 15–41)
Albumin: 3.9 g/dL (ref 3.5–5.0)
Alkaline Phosphatase: 77 U/L (ref 38–126)
Anion gap: 9 (ref 5–15)
BUN: 27 mg/dL — ABNORMAL HIGH (ref 8–23)
CO2: 26 mmol/L (ref 22–32)
Calcium: 9.8 mg/dL (ref 8.9–10.3)
Chloride: 104 mmol/L (ref 98–111)
Creatinine, Ser: 0.99 mg/dL (ref 0.44–1.00)
GFR calc Af Amer: >60 mL/min (ref >60)
GFR calc non Af Amer: 56 mL/min — ABNORMAL LOW (ref >60)
Glucose, Bld: 198 mg/dL — ABNORMAL HIGH (ref 70–99)
Potassium: 4.3 mmol/L (ref 3.5–5.1)
Sodium: 139 mmol/L (ref 135–145)
Total Bilirubin: 1 mg/dL (ref 0.3–1.2)
Total Protein: 7 g/dL (ref 6.5–8.1)

## 2018-02-28 MED FILL — IBRANCE 125 MG CAPSULE: 125 | 28 days supply | Qty: 21 | Fill #4

## 2018-03-01 LAB — CANCER ANTIGEN 27.29: CA 27.29: 13.1 U/mL (ref 0.0–38.6)

## 2018-03-03 ENCOUNTER — Other Ambulatory Visit: Payer: Self-pay

## 2018-03-03 DIAGNOSIS — Z170 Estrogen receptor positive status [ER+]: Principal | ICD-10-CM

## 2018-03-03 DIAGNOSIS — C50812 Malignant neoplasm of overlapping sites of left female breast: Secondary | ICD-10-CM

## 2018-03-07 ENCOUNTER — Encounter: Payer: Self-pay | Admitting: Hematology and Oncology

## 2018-03-07 ENCOUNTER — Inpatient Hospital Stay: Payer: Medicare Other | Admitting: Hematology and Oncology

## 2018-03-07 VITALS — BP 158/79 | HR 75 | Temp 98.5°F | Resp 18 | Ht 66.5 in | Wt 248.8 lb

## 2018-03-07 DIAGNOSIS — C50812 Malignant neoplasm of overlapping sites of left female breast: Secondary | ICD-10-CM | POA: Diagnosis not present

## 2018-03-07 DIAGNOSIS — Z79899 Other long term (current) drug therapy: Secondary | ICD-10-CM

## 2018-03-07 DIAGNOSIS — Z170 Estrogen receptor positive status [ER+]: Secondary | ICD-10-CM

## 2018-03-07 DIAGNOSIS — Z7901 Long term (current) use of anticoagulants: Secondary | ICD-10-CM

## 2018-03-07 DIAGNOSIS — I252 Old myocardial infarction: Secondary | ICD-10-CM

## 2018-03-07 DIAGNOSIS — C773 Secondary and unspecified malignant neoplasm of axilla and upper limb lymph nodes: Secondary | ICD-10-CM | POA: Diagnosis not present

## 2018-03-07 DIAGNOSIS — I10 Essential (primary) hypertension: Secondary | ICD-10-CM

## 2018-03-07 DIAGNOSIS — N951 Menopausal and female climacteric states: Secondary | ICD-10-CM

## 2018-03-07 DIAGNOSIS — Z87891 Personal history of nicotine dependence: Secondary | ICD-10-CM

## 2018-03-07 DIAGNOSIS — Z79811 Long term (current) use of aromatase inhibitors: Secondary | ICD-10-CM

## 2018-03-07 DIAGNOSIS — Z7189 Other specified counseling: Secondary | ICD-10-CM

## 2018-03-07 DIAGNOSIS — M170 Bilateral primary osteoarthritis of knee: Secondary | ICD-10-CM

## 2018-03-07 DIAGNOSIS — Z9013 Acquired absence of bilateral breasts and nipples: Secondary | ICD-10-CM

## 2018-03-07 NOTE — Progress Notes (Signed)
No new changes noted today 

## 2018-03-07 NOTE — Progress Notes (Signed)
[facility]     [address], [address]     [place], [place] [address]     Phone: [phone]      Fax: [phone]        Clinic day:  03/07/2018  Chief Complaint: Faith [name] is a 73 y.o. female with metastatic breast cancer on Ibrance and Femara who is seen for new patient assessment.  HPI:   The patient diagnosed with left breast cancer 2015.   She presented with locally advanced, probably inflammatory, breast cancer.  Biopsy on 05/11/2013 revealed invasive mammary carcinoma.  Axillary lymph node was positive for metastatic disease.  Tumor was ER positive (>90%), PR positive (80%) and HER-2/neu 2+ by IHC (equivocal by [facility]).  Clinical stage was T4dN1M1 (stage IV).  Chest CT on 05/09/2013 revealed a 2.8 cm soft tissue nodule in the left axillary tail.  There was bulky axillary adenopathy (largest 1.8 cm).  There were prominent mediastinal lymph nodes (7.3 cm AP window; 7 mm and 8 mm anterior to the central right mainstem bronchus, 11 mm central right hilar lymph node).  There was a small left pleural effusion.  Bone scan on 05/29/2013 revealed no evidence of metastatic disease.  Abdomen and pelvis CT on 05/31/2013 revealed no evidence of abdominal pelvic metastatic disease.   There was sclerosis of the upper sacrum is without abnormal activity on recent bone scan, likely incidental.  She was treated with Taxotere, carboplatin and Herceptin [facility]) and Perjeta 05/2013 by Dr Oliva Bustard.  Treatment was changed to Adriamycin and Cytoxan (AC) on 08/31/2013.  She received a total of 3 cycles of AC (last on 10/19/2013).  She underwent bilateral mastectomy on 11/19/2013.  Left breast pathology revealed a 3.7 cm grade III invasive mammary carcinoma with extensive lymphovascular invasion within breast and skin.  There was metastatic carcinoma in 17 of 17 lymph nodes.  Right breast revealed extensive lymphovascular invasion by metastatic mammary carcinoma.  One lymph node was  negative for malignancy.  Pathologic stage was ypT4b ypN3a ypM1 (stage IV disease).  Repeat HER-2/neu testing by FISH was equivocal.  She began Femara and Ibrance in 11/2013.  PET scan in 06/02/2015 without hypermetabolic recurrent or residual disease.  There was suspicion of a right middle lobe pulmonary nodule, suboptimally evaluated.  Chest, abdomen, and pelvis CT on 11/19/2015 revealed no evidence of metastatic disease.  The 4 mm right middle lobe nodule was not identified. Chest, abdomen and pelvis CT on 05/21/2016, 02/21/2017, and 12/01/2017 revealed no evidence of metastatic disease.  The patient had an acute MI on 11/03/2015.  She underwent stent placement.  She is on a baby aspirin.  She notes that she had a stress test yesterday and is ready for surgery.  The patient last seen by Dr. Rogue Bussing on 02/07/2018.  At that time, she complained of bilateral knee pain.  She was awaiting orthopedics evaluation on 02/22/2018 to discuss knee replacement.  She states that she is scheduled to see Dr Rudene Christians on 03/13/2018.  She is going to have the left knee done first then the right knee.  She is to stop Ibrance 2 weeks prior to her surgery.  She is currently day 3 of her current cycle of Ibrance (started 03/05/2018).  She continues Femara.  CA27.29 has been followed: 60.3 on 05/18/2013, < 3.5 on 11/29/2013, 19.6 on 04/18/2014, 22.9 on 05/16/2014, 16.9 on 08/12/2014, 12.2 on 10/14/2014, 11.9 on 01/06/2015, 11.5 on 04/11/2015, 17.3 on 09/09/2015, 15.7 on 11/19/2015, 15.3 on  02/24/2016, 17.5 on 05/21/2016, 17.1 on 08/24/2016, 17.2 on 11/23/2016, 12.6 on 02/21/2017, 13.2 on 05/24/2017, 18.0 on 07/26/2017, 10.7 on 10/04/2017, 18.0 on 12/01/2017, 12.1 on 02/07/2018, and 13.1 on 02/28/2018.  Symptomatically, she notes feeling awful.  She states that her hair is falling out "bit by bit".  Her weight is up.  She has hot flashes.  Sleep and appetite are poor.  She has occasional nausea.   Past Medical History:    Diagnosis Date  . Arthritis    bilateral knees  . Cancer [facility]) April 2015   left breast  . Coronary artery disease   . Hypertension   . Myocardial infarction ([place]) 11/03/2015   [facility], stent placed  . Pneumonia     Past Surgical History:  Procedure Laterality Date  . BREAST SURGERY Bilateral 11/19/13   bilater mastectomy   . CARDIAC CATHETERIZATION    . CORONARY ANGIOPLASTY    . PORT-A-CATH REMOVAL  2016  . PORTACATH PLACEMENT  06-07-13  . SHOULDER OPEN ROTATOR CUFF REPAIR  2012  . TOTAL KNEE ARTHROPLASTY Left 03/28/2018   Procedure: TOTAL KNEE ARTHROPLASTY-LEFT;  Surgeon: Hessie Knows, MD;  Location: [facility] ORS;  Service: Orthopedics;  Laterality: Left;  . WRIST SURGERY Left 1969   cyst    Family History  Adopted: Yes    Social History:  reports that she quit smoking about 33 years ago. Her smoking use included cigarettes. She has a 15.00 pack-year smoking history. She has never used smokeless tobacco. She reports current alcohol use. She reports that she does not use drugs.  She is retired; she previously drove a truck.  She is from [place].  The patient is alone today.  Allergies:  Allergies  Allergen Reactions  . Mushroom Extract Complex Hives and Swelling  . Shellfish Allergy Nausea And Vomiting    Current Medications: Current Outpatient Medications  Medication Sig Dispense Refill  . atorvastatin (LIPITOR) 80 MG tablet Take 80 mg by mouth daily at 6 PM.     . Calcium Carbonate-Vitamin D (CALCIUM + D PO) Take 1 tablet by mouth daily.    . Cholecalciferol (VITAMIN D PO) Take 5,000 Units by mouth daily.    Marland Kitchen letrozole (FEMARA) 2.5 MG tablet Take 1 tablet (2.5 mg total) by mouth daily. (Patient not taking: Reported on 03/15/2018) 90 tablet 1  . losartan (COZAAR) 25 MG tablet Take 25 mg by mouth daily.    . metoprolol tartrate (LOPRESSOR) 25 MG tablet Take 25 mg by mouth 2 (two) times daily.     Marland Kitchen enoxaparin (LOVENOX) 40 MG/0.4ML injection Inject 0.4 mLs (40 mg total) into  the skin daily for 14 days. 14 Syringe 0  . HYDROcodone-acetaminophen (NORCO/VICODIN) 5-325 MG tablet Take 1-2 tablets by mouth every 4 (four) hours as needed for moderate pain (pain score 4-6). 30 tablet 0  . nitroGLYCERIN (NITROSTAT) 0.4 MG SL tablet Place under the tongue.    . ondansetron (ZOFRAN-ODT) 8 MG disintegrating tablet Take by mouth every 8 (eight) hours as needed.     . palbociclib (IBRANCE) 125 MG capsule Take 1 capsule (125 mg total) by mouth daily with breakfast. Take for 3 weeks, then 1 week off. 21 capsule 4   No current facility-administered medications for this visit.    Facility-Administered Medications Ordered in Other Visits  Medication Dose Route Frequency Provider Last Rate Last Dose  . sodium chloride 0.9 % injection 10 mL  10 mL Intravenous PRN [name], MD   10 mL at 09/12/14 1014  Review of Systems:  GENERAL:  Feels "awful".  No fevers, sweats.  Weight gain. PERFORMANCE STATUS (ECOG):  1-2 HEENT:  No visual changes, runny nose, sore throat, mouth sores or tenderness. Lungs: No shortness of breath or cough.  No hemoptysis. Cardiac:  S/p MI 10/2015.  No chest pain, palpitations, orthopnea, or PND. GI:  Occasional nausea.  No vomiting, diarrhea, constipation, melena or hematochezia. GU:  No urgency, frequency, dysuria, or hematuria. Musculoskeletal:  No back pain.  Bilateral knee pain.  No muscle tenderness. Extremities:  No pain or swelling. Skin:  Hair falling out.  No rashes or skin changes. Neuro:  No headache, numbness or weakness, balance or coordination issues. Endocrine:  No diabetes, thyroid issues, or night sweats.  Hot flashes. Psych:  No mood changes, depression or anxiety.  Poor sleep. Pain:  No focal pain. Review of systems:  All other systems reviewed and found to be negative.  Physical Exam: Blood pressure (!) 158/79, pulse 75, temperature 98.5 F (36.9 C), temperature source Tympanic, resp. rate 18, height 5' 6.5" (1.689 m), weight  248 lb 12.6 oz (112.9 kg), SpO2 98 %. GENERAL:  Well developed, well nourished, woman sitting comfortably in the exam room in no acute distress. MENTAL STATUS:  Alert and oriented to person, place and time. HEAD:  Shoulder length graying hair.  Normocephalic, atraumatic, face symmetric, no Cushingoid features. EYES:  Glasses.  Hazel eyes.  Pupils equal round and reactive to light and accomodation.  No conjunctivitis or scleral icterus. ENT:  Oropharynx clear without lesion.  Tongue normal. Mucous membranes moist.  RESPIRATORY:  Clear to auscultation without rales, wheezes or rhonchi. CARDIOVASCULAR:  Regular rate and rhythm without murmur, rub or gallop. BREAST:  s/p bilateral mastectomy.  No erythema or nodularity.   ABDOMEN:  Soft, non-tender, with active bowel sounds, and no hepatosplenomegaly.  No masses. SKIN:  No rashes, ulcers or lesions. EXTREMITIES: Chronic left lower extremity edema. No skin discoloration or tenderness.  No palpable cords. LYMPH NODES: No palpable cervical, supraclavicular, axillary or inguinal adenopathy  NEUROLOGICAL: Unremarkable. PSYCH:  Appropriate.  Imaging studies: 05/09/2013:  Chest CT revealed a 2.8 cm soft tissue nodule in the left axillary tail.  There was bulky axillary adenopathy (largest 1.8 cm).  There were prominent mediastinal lymph nodes (7.3 cm AP window; 7 mm and 8 mm anterior to the central right mainstem bronchus, 11 mm central right hilar lymph node).  There was a small left pleural effusion.   05/29/2013:  Bone scan revealed no evidence of metastatic disease.   05/31/2013:  Abdomen and pelvis CT revealed no evidence of abdominal pelvic metastatic disease.   There was sclerosis of the upper sacrum is without abnormal activity on recent bone scan, likely incidental. 06/02/2015:  PET scan revealed no hypermetabolic recurrent or residual disease.  There was suspicion of a right middle lobe pulmonary nodule, suboptimally evaluated.   11/19/2015:   Chest, abdomen, and pelvis CT revealed no evidence of metastatic disease.  The 4 mm right middle lobe nodule was not identified.  05/21/2016:  Chest, abdomen and pelvis CT revealed no evidence of metastatic disease. 02/21/2017:  Chest, abdomen and pelvis CT revealed no evidence of metastatic disease. 12/01/2017:  Chest, abdomen and pelvis CT revealed no evidence of metastatic disease.   No visits with results within 3 Day(s) from this visit.  Latest known visit with results is:  Appointment on 02/28/2018  Component Date Value Ref Range Status  . CA 27.29 02/28/2018 13.1  0.0 - 38.6  U/mL Final   Comment: (NOTE) Siemens Centaur Immunochemiluminometric Methodology [facility]) Values obtained with different assay methods or kits cannot be used interchangeably. Results cannot be interpreted as absolute evidence of the presence or absence of malignant disease. Performed At: [facility] [place], [place] [id number] Rush Farmer MD WU:[id number]   . Sodium 02/28/2018 139  135 - 145 mmol/L Final  . Potassium 02/28/2018 4.3  3.5 - 5.1 mmol/L Final  . Chloride 02/28/2018 104  98 - 111 mmol/L Final  . CO2 02/28/2018 26  22 - 32 mmol/L Final  . Glucose, Bld 02/28/2018 198* 70 - 99 mg/dL Final  . BUN 02/28/2018 27* 8 - 23 mg/dL Final  . Creatinine, Ser 02/28/2018 0.99  0.44 - 1.00 mg/dL Final  . Calcium 02/28/2018 9.8  8.9 - 10.3 mg/dL Final  . Total Protein 02/28/2018 7.0  6.5 - 8.1 g/dL Final  . Albumin 02/28/2018 3.9  3.5 - 5.0 g/dL Final  . AST 02/28/2018 46* 15 - 41 U/L Final  . ALT 02/28/2018 40  0 - 44 U/L Final  . Alkaline Phosphatase 02/28/2018 77  38 - 126 U/L Final  . Total Bilirubin 02/28/2018 1.0  0.3 - 1.2 mg/dL Final  . GFR calc non Af Amer 02/28/2018 56* >60 mL/min Final  . GFR calc Af Amer 02/28/2018 >60  >60 mL/min Final  . Anion gap 02/28/2018 9  5 - 15 Final   Performed at [facility], [address] [place], [place] [address]  . WBC  02/28/2018 2.4* 4.0 - 10.5 K/uL Final  . RBC 02/28/2018 3.26* 3.87 - 5.11 MIL/uL Final  . Hemoglobin 02/28/2018 11.6* 12.0 - 15.0 g/dL Final  . HCT 02/28/2018 33.5* 36.0 - 46.0 % Final  . MCV 02/28/2018 102.8* 80.0 - 100.0 fL Final  . MCH 02/28/2018 35.6* 26.0 - 34.0 pg Final  . MCHC 02/28/2018 34.6  30.0 - 36.0 g/dL Final  . RDW 02/28/2018 13.4  11.5 - 15.5 % Final  . Platelets 02/28/2018 172  150 - 400 K/uL Final  . nRBC 02/28/2018 0.0  0.0 - 0.2 % Final  . Neutrophils Relative % 02/28/2018 58  % Final  . Neutro Abs 02/28/2018 1.4* 1.7 - 7.7 K/uL Final  . Lymphocytes Relative 02/28/2018 29  % Final  . Lymphs Abs 02/28/2018 0.7  0.7 - 4.0 K/uL Final  . Monocytes Relative 02/28/2018 7  % Final  . Monocytes Absolute 02/28/2018 0.2  0.1 - 1.0 K/uL Final  . Eosinophils Relative 02/28/2018 4  % Final  . Eosinophils Absolute 02/28/2018 0.1  0.0 - 0.5 K/uL Final  . Basophils Relative 02/28/2018 2  % Final  . Basophils Absolute 02/28/2018 0.1  0.0 - 0.1 K/uL Final  . Immature Granulocytes 02/28/2018 0  % Final  . Abs Immature Granulocytes 02/28/2018 0.01  0.00 - 0.07 K/uL Final   Performed at [facility], [address] [place], [place] [address]    Assessment:  Faith [name] is a 73 y.o. female with metastatic breast cancer s/p biopsy on 05/11/2013.  Pathology revealed invasive mammary carcinoma.  Axillary lymph node was positive for metastatic disease.  Tumor was ER positive (>90%), PR positive (80%) and HER-2/neu 2+ by IHC (equivocal by [facility]).  Clinical stage was T4dN1M1 (stage IV).  Chest CT on 05/09/2013 revealed a 2.8 cm soft tissue nodule in the left axillary tail.  There was bulky axillary adenopathy (largest 1.8 cm).  There were prominent mediastinal lymph nodes (  7.3 cm AP window; 7 mm and 8 mm anterior to the central right mainstem bronchus, 11 mm central right hilar lymph node).  There was a small left pleural effusion.  Bone scan on 05/29/2013 revealed no evidence of  metastatic disease.  Abdomen and pelvis CT on 05/31/2013 revealed no evidence of abdominal pelvic metastatic disease.   She received 1 cycle of neoadjuvant TCH and Perjeta beginning 05/2013.  She then received 3 cycles of Adriamycin and Cytoxan (AC) from 08/31/2013 - 10/19/2013.  She underwent bilateral mastectomy on 11/19/2013.  Left breast pathology revealed a 3.7 cm grade III invasive mammary carcinoma with extensive lymphovascular invasion within breast and skin.  There was metastatic carcinoma in 17 of 17 lymph nodes.  Right breast pathology revealed extensive lymphovascular invasion by metastatic mammary carcinoma.  One lymph node was negative for malignancy.  Pathologic stage was ypT4b ypN3a ypM1 (stage IV disease).  Repeat HER-2/neu testing by [facility] was equivocal.  She began Femara and Ibrance in 11/2013 (last cycle began 03/05/2018).    Chest, abdomen, and pelvis CT on 12/01/2017 revealed no evidence of metastatic disease.  CA27.29 has been followed: 60.3 on 05/18/2013, < 3.5 on 11/29/2013, 19.6 on 04/18/2014, 22.9 on 05/16/2014, 16.9 on 08/12/2014, 12.2 on 10/14/2014, 11.9 on 01/06/2015, 11.5 on 04/11/2015, 17.3 on 09/09/2015, 15.7 on 11/19/2015, 15.3 on 02/24/2016, 17.5 on 05/21/2016, 17.1 on 08/24/2016, 17.2 on 11/23/2016, 12.6 on 02/21/2017, 13.2 on 05/24/2017, 18.0 on 07/26/2017, 10.7 on 10/04/2017, 18.0 on 12/01/2017, 12.1 on 02/07/2018, and 13.1 on 02/28/2018.  She had a STEMI on 11/03/2015.  Cardiac cath revealed 100% occlusion of the proximal left circumflex artery.  She was treated at [facility] with primary PCI with drug eluting stent (DES) and tirofiban.  Myocardial perfusion SPECT on 03/06/2018 revealed an EF of 59%, normal wall motion, and homogeneous tracer distribution throughout the myocardium.  She has osteoarthritis of her knees.  She is planning bilateral knee replacement.  Symptomatically, she notes knee pain.  She has hot flashes.  Exam reveals no chest wall abnormalities or  adenopathy.  Plan: 1.   Review labs from 02/28/2018.  2.   Metastatic breast cancer  Review entire medical history, diagnosis and management of advanced stage breast cancer.  Patient currently day 3 on Ibrance and Femara.  Scans on 12/01/2017 revealed no evidence of metastatic disease.  She has plans for upcoming bilateral knee surgery.  Discuss plans to hold Ibrance beginning 2 weeks prior to surgery. 3.   Osteoarthritis of the knees  Patient scheduled to meet with Dr Rudene Christians in anticipation of bilateral knee replacement (left then right).  Patient to begin holding Ibrance 2 weeks prior to surgery. 4.   Health maintenance  Patient has been on Femara since 11/2013.  Discuss bone density. 5.   Patient to call after knee surgery for MD assessment, labs (CBC with diff, CMP, CA27.29), and reinitiation of Ibrance and Femara.  I discussed the assessment and treatment plan with the patient.  The patient was provided an opportunity to ask questions and all were answered.  The patient agreed with the plan and demonstrated an understanding of the instructions.  The patient was advised to call back or seek an in person evaluation if the symptoms worsen or if the condition fails to improve as anticipated.    Lequita Asal, MD  03/07/2018, 3:35 PM

## 2018-03-15 ENCOUNTER — Other Ambulatory Visit: Payer: Self-pay

## 2018-03-15 ENCOUNTER — Encounter
Admission: RE | Admit: 2018-03-15 | Discharge: 2018-03-15 | Disposition: A | Discharge status: Home / Self Care | Payer: Medicare Other | Source: Ambulatory Visit | Attending: Orthopedic Surgery | Admitting: Orthopedic Surgery

## 2018-03-15 DIAGNOSIS — Z01812 Encounter for preprocedural laboratory examination: Secondary | ICD-10-CM | POA: Insufficient documentation

## 2018-03-15 HISTORY — DX: Pneumonia, unspecified organism: J18.9

## 2018-03-15 HISTORY — DX: Atherosclerotic heart disease of native coronary artery without angina pectoris: I25.10

## 2018-03-15 HISTORY — DX: Unspecified osteoarthritis, unspecified site: M19.90

## 2018-03-15 LAB — URINALYSIS, ROUTINE W REFLEX MICROSCOPIC
Bilirubin Urine: NEGATIVE
Glucose, UA: NEGATIVE mg/dL
Hgb urine dipstick: NEGATIVE
Ketones, ur: NEGATIVE mg/dL
Nitrite: POSITIVE — AB
Protein, ur: NEGATIVE mg/dL
Specific Gravity, Urine: 1.024 (ref 1.005–1.030)
pH: 5 (ref 5.0–8.0)

## 2018-03-15 LAB — TYPE AND SCREEN
ABO/RH(D): A POS
Antibody Screen: NEGATIVE

## 2018-03-15 LAB — BASIC METABOLIC PANEL
Anion gap: 7 (ref 5–15)
BUN: 20 mg/dL (ref 8–23)
CHLORIDE: 101 mmol/L (ref 98–111)
CO2: 30 mmol/L (ref 22–32)
Calcium: 10 mg/dL (ref 8.9–10.3)
Creatinine, Ser: 0.92 mg/dL (ref 0.44–1.00)
GFR calc Af Amer: >60 mL/min (ref >60)
GFR calc non Af Amer: >60 mL/min (ref >60)
Glucose, Bld: 238 mg/dL — ABNORMAL HIGH (ref 70–99)
Potassium: 3.7 mmol/L (ref 3.5–5.1)
Sodium: 138 mmol/L (ref 135–145)

## 2018-03-15 LAB — CBC
HCT: 36.3 % (ref 36.0–46.0)
HEMOGLOBIN: 12.4 g/dL (ref 12.0–15.0)
MCH: 34.3 pg — ABNORMAL HIGH (ref 26.0–34.0)
MCHC: 34.2 g/dL (ref 30.0–36.0)
MCV: 100.6 fL — ABNORMAL HIGH (ref 80.0–100.0)
Platelets: 308 10*3/uL (ref 150–400)
RBC: 3.61 MIL/uL — ABNORMAL LOW (ref 3.87–5.11)
RDW: 13.2 % (ref 11.5–15.5)
WBC: 4.3 10*3/uL (ref 4.0–10.5)
nRBC: 0 % (ref 0.0–0.2)

## 2018-03-15 LAB — SURGICAL PCR SCREEN
MRSA, PCR: NEGATIVE
Staphylococcus aureus: NEGATIVE

## 2018-03-15 LAB — PROTIME-INR
INR: 1 (ref 0.8–1.2)
Prothrombin Time: 13 seconds (ref 11.4–15.2)

## 2018-03-15 LAB — APTT: APTT: 44 s — AB (ref 24–36)

## 2018-03-15 LAB — SEDIMENTATION RATE: Sed Rate: 65 mm/hr — ABNORMAL HIGH (ref 0–30)

## 2018-03-15 NOTE — Pre-Procedure Instructions (Signed)
Seen by cardiologist, Dr. Ubaldo Glassing on 02/17/2018 and had stress test and EKG which revealed no ischemia; follow up in 6 months. Patient states that she can walk a flight of stairs and walk a mile without shortness of breath or chest pain. No new cardiac symptoms since seen by cardiologist.

## 2018-03-15 NOTE — Patient Instructions (Signed)
Your procedure is scheduled on: Tuesday, March 10 Report to Day Surgery on the 2nd floor of the [facility]. To find out your arrival time, please call [phone] between 1PM - 3PM on:  Monday, March 9  REMEMBER: Instructions that are not followed completely may result in serious medical risk, up to and including death; or upon the discretion of your surgeon and anesthesiologist your surgery may need to be rescheduled.  Do not eat food after midnight the night before surgery.  No gum chewing, lozengers or hard candies.  You may however, drink CLEAR liquids up to 2 hours before you are scheduled to arrive for your surgery. Do not drink anything within 2 hours of the start of your surgery.  Clear liquids include: - water  - apple juice without pulp - gatorade - black coffee or tea (Do NOT add milk or creamers to the coffee or tea) Do NOT drink anything that is not on this list.  No Alcohol for 24 hours before or after surgery.  No Smoking including e-cigarettes for 24 hours prior to surgery.  No chewable tobacco products for at least 6 hours prior to surgery.  No nicotine patches on the day of surgery.  On the morning of surgery brush your teeth with toothpaste and water, you may rinse your mouth with mouthwash if you wish. Do not swallow any toothpaste or mouthwash.  Notify your doctor if there is any change in your medical condition (cold, fever, infection).  Do not wear jewelry, make-up, hairpins, clips or nail polish.  Do not wear lotions, powders, or perfumes.   Do not shave 48 hours prior to surgery.   Contacts and dentures may not be worn into surgery.  Do not bring valuables to the hospital, including drivers license, insurance or credit cards.   is not responsible for any belongings or valuables.   TAKE THESE MEDICATIONS THE MORNING OF SURGERY:  1.  Metoprolol  Use CHG Soap as directed on instruction sheet.  Starting March 3 - Stop  Anti-inflammatories (NSAIDS) such as Advil, Aleve, Ibuprofen, Motrin, Naproxen, Naprosyn and Aspirin based products such as Excedrin, Goodys Powder, BC Powder. (May take Tylenol or Acetaminophen if needed.)  Starting March 3 - Stop ANY OVER THE COUNTER supplements until after surgery. (May continue Vitamin D.)  Plan for stool softeners for home use.  If you are being admitted to the hospital overnight, leave your suitcase in the car. After surgery it may be brought to your room.  Please call [phone] if you have any questions about these instructions.

## 2018-03-18 LAB — URINE CULTURE: Culture: >=100000 — AB

## 2018-03-20 NOTE — Pre-Procedure Instructions (Signed)
uc faxed to dr Rudene Christians

## 2018-03-27 MED ORDER — CEFAZOLIN SODIUM-DEXTROSE 2-4 GM/100ML-% IV SOLN
2.0000 g | Freq: Once | INTRAVENOUS | Status: AC
  Administered 2018-03-28: 2 g via INTRAVENOUS

## 2018-03-28 ENCOUNTER — Inpatient Hospital Stay: Payer: Medicare Other

## 2018-03-28 ENCOUNTER — Inpatient Hospital Stay: Payer: Medicare Other | Admitting: Anesthesiology

## 2018-03-28 ENCOUNTER — Encounter: Payer: Self-pay | Admitting: *Deleted

## 2018-03-28 ENCOUNTER — Inpatient Hospital Stay
Admission: RE | Admit: 2018-03-28 | Discharge: 2018-03-30 | DRG: 470 | Disposition: A | Discharge status: Home / Self Care | Payer: Medicare Other | Attending: Orthopedic Surgery | Admitting: Orthopedic Surgery

## 2018-03-28 ENCOUNTER — Other Ambulatory Visit: Payer: Self-pay

## 2018-03-28 ENCOUNTER — Encounter
Admission: RE | Disposition: A | Discharge status: Home / Self Care | Payer: Self-pay | Source: Home / Self Care | Attending: Orthopedic Surgery

## 2018-03-28 DIAGNOSIS — I252 Old myocardial infarction: Secondary | ICD-10-CM | POA: Diagnosis not present

## 2018-03-28 DIAGNOSIS — M1712 Unilateral primary osteoarthritis, left knee: Secondary | ICD-10-CM | POA: Diagnosis present

## 2018-03-28 DIAGNOSIS — I10 Essential (primary) hypertension: Secondary | ICD-10-CM | POA: Diagnosis present

## 2018-03-28 DIAGNOSIS — Z79818 Long term (current) use of other agents affecting estrogen receptors and estrogen levels: Secondary | ICD-10-CM

## 2018-03-28 DIAGNOSIS — D62 Acute posthemorrhagic anemia: Secondary | ICD-10-CM | POA: Diagnosis not present

## 2018-03-28 DIAGNOSIS — Z853 Personal history of malignant neoplasm of breast: Secondary | ICD-10-CM | POA: Diagnosis not present

## 2018-03-28 DIAGNOSIS — I2510 Atherosclerotic heart disease of native coronary artery without angina pectoris: Secondary | ICD-10-CM | POA: Diagnosis present

## 2018-03-28 DIAGNOSIS — Z96652 Presence of left artificial knee joint: Secondary | ICD-10-CM

## 2018-03-28 DIAGNOSIS — G8918 Other acute postprocedural pain: Secondary | ICD-10-CM

## 2018-03-28 HISTORY — PX: TOTAL KNEE ARTHROPLASTY: SHX125

## 2018-03-28 LAB — CREATININE, SERUM
CREATININE: 0.78 mg/dL (ref 0.44–1.00)
GFR calc Af Amer: >60 mL/min (ref >60)
GFR calc non Af Amer: >60 mL/min (ref >60)

## 2018-03-28 LAB — CBC
HCT: 31.7 % — ABNORMAL LOW (ref 36.0–46.0)
Hemoglobin: 10.6 g/dL — ABNORMAL LOW (ref 12.0–15.0)
MCH: 33.8 pg (ref 26.0–34.0)
MCHC: 33.4 g/dL (ref 30.0–36.0)
MCV: 101 fL — ABNORMAL HIGH (ref 80.0–100.0)
NRBC: 0 % (ref 0.0–0.2)
Platelets: 191 10*3/uL (ref 150–400)
RBC: 3.14 MIL/uL — ABNORMAL LOW (ref 3.87–5.11)
RDW: 13.1 % (ref 11.5–15.5)
WBC: 7 10*3/uL (ref 4.0–10.5)

## 2018-03-28 LAB — ABO/RH: ABO/RH(D): A POS

## 2018-03-28 SURGERY — ARTHROPLASTY, KNEE, TOTAL
Anesthesia: Spinal | Laterality: Left

## 2018-03-28 MED ORDER — ACETAMINOPHEN 325 MG PO TABS: 325.0000 mg | ORAL_TABLET | Freq: Four times a day (QID) | ORAL | Status: DC | PRN

## 2018-03-28 MED ORDER — TRAMADOL HCL 50 MG PO TABS
50.0000 mg | ORAL_TABLET | Freq: Four times a day (QID) | ORAL | Status: DC
  Administered 2018-03-28 – 2018-03-30 (×8): 50 mg via ORAL
  Filled 2018-03-28 (×8): qty 1

## 2018-03-28 MED ORDER — ALUM & MAG HYDROXIDE-SIMETH 200-200-20 MG/5ML PO SUSP: 30.0000 mL | ORAL | Status: DC | PRN

## 2018-03-28 MED ORDER — PHENOL 1.4 % MT LIQD: 1.0000 | OROMUCOSAL | Status: DC | PRN

## 2018-03-28 MED ORDER — METOPROLOL TARTRATE 25 MG PO TABS
25.0000 mg | ORAL_TABLET | Freq: Two times a day (BID) | ORAL | Status: DC
  Administered 2018-03-28 – 2018-03-30 (×4): 25 mg via ORAL
  Filled 2018-03-28 (×4): qty 1

## 2018-03-28 MED ORDER — MORPHINE SULFATE 10 MG/ML IJ SOLN
INTRAMUSCULAR | Status: DC | PRN
  Administered 2018-03-28: 10 mg via INTRAMUSCULAR

## 2018-03-28 MED ORDER — KETOROLAC TROMETHAMINE 15 MG/ML IJ SOLN: 15.0000 mg | Freq: Four times a day (QID) | INTRAMUSCULAR | Status: DC

## 2018-03-28 MED ORDER — ACETAMINOPHEN 500 MG PO TABS
500.0000 mg | ORAL_TABLET | Freq: Four times a day (QID) | ORAL | Status: AC
  Administered 2018-03-28 – 2018-03-29 (×4): 500 mg via ORAL
  Filled 2018-03-28 (×4): qty 1

## 2018-03-28 MED ORDER — FENTANYL CITRATE (PF) 100 MCG/2ML IJ SOLN
INTRAMUSCULAR | Status: AC
  Filled 2018-03-28: qty 2

## 2018-03-28 MED ORDER — BUPIVACAINE HCL (PF) 0.5 % IJ SOLN
INTRAMUSCULAR | Status: AC
  Filled 2018-03-28: qty 10

## 2018-03-28 MED ORDER — BUPIVACAINE LIPOSOME 1.3 % IJ SUSP
INTRAMUSCULAR | Status: AC
  Filled 2018-03-28: qty 20

## 2018-03-28 MED ORDER — ONDANSETRON HCL 4 MG/2ML IJ SOLN
4.0000 mg | Freq: Four times a day (QID) | INTRAMUSCULAR | Status: DC | PRN
  Administered 2018-03-29: 4 mg via INTRAVENOUS
  Filled 2018-03-28: qty 2

## 2018-03-28 MED ORDER — ATORVASTATIN CALCIUM 80 MG PO TABS
80.0000 mg | ORAL_TABLET | Freq: Every day | ORAL | Status: DC
  Administered 2018-03-29: 80 mg via ORAL
  Filled 2018-03-28: qty 1
  Filled 2018-03-28: qty 4
  Filled 2018-03-28 (×2): qty 1

## 2018-03-28 MED ORDER — LOSARTAN POTASSIUM 25 MG PO TABS
25.0000 mg | ORAL_TABLET | Freq: Every day | ORAL | Status: DC
  Administered 2018-03-29 – 2018-03-30 (×2): 25 mg via ORAL
  Filled 2018-03-28 (×2): qty 1

## 2018-03-28 MED ORDER — HYDROCODONE-ACETAMINOPHEN 7.5-325 MG PO TABS
1.0000 | ORAL_TABLET | ORAL | Status: DC | PRN
  Administered 2018-03-28: 1 via ORAL
  Filled 2018-03-28 (×2): qty 1

## 2018-03-28 MED ORDER — HYDROCODONE-ACETAMINOPHEN 5-325 MG PO TABS
1.0000 | ORAL_TABLET | ORAL | Status: DC | PRN
  Administered 2018-03-28 – 2018-03-29 (×2): 1 via ORAL
  Administered 2018-03-29: 2 via ORAL
  Filled 2018-03-28: qty 2
  Filled 2018-03-28 (×2): qty 1

## 2018-03-28 MED ORDER — SODIUM CHLORIDE 0.9 % IV SOLN
INTRAVENOUS | Status: DC | PRN
  Administered 2018-03-28: 1000 mL

## 2018-03-28 MED ORDER — ENOXAPARIN SODIUM 30 MG/0.3ML ~~LOC~~ SOLN
30.0000 mg | Freq: Two times a day (BID) | SUBCUTANEOUS | Status: DC
  Administered 2018-03-29 – 2018-03-30 (×3): 30 mg via SUBCUTANEOUS
  Filled 2018-03-28 (×3): qty 0.3

## 2018-03-28 MED ORDER — METOCLOPRAMIDE HCL 5 MG/ML IJ SOLN: 5.0000 mg | Freq: Three times a day (TID) | INTRAMUSCULAR | Status: DC | PRN

## 2018-03-28 MED ORDER — FENTANYL CITRATE (PF) 100 MCG/2ML IJ SOLN
INTRAMUSCULAR | Status: DC | PRN
  Administered 2018-03-28 (×2): 50 ug via INTRAVENOUS

## 2018-03-28 MED ORDER — MIDAZOLAM HCL 2 MG/2ML IJ SOLN
INTRAMUSCULAR | Status: AC
  Filled 2018-03-28: qty 2

## 2018-03-28 MED ORDER — BUPIVACAINE HCL (PF) 0.5 % IJ SOLN
INTRAMUSCULAR | Status: DC | PRN
  Administered 2018-03-28: 3 mL via INTRATHECAL

## 2018-03-28 MED ORDER — SODIUM CHLORIDE FLUSH 0.9 % IV SOLN
INTRAVENOUS | Status: AC
  Filled 2018-03-28: qty 70

## 2018-03-28 MED ORDER — MORPHINE SULFATE (PF) 10 MG/ML IV SOLN
INTRAVENOUS | Status: AC
  Filled 2018-03-28: qty 1

## 2018-03-28 MED ORDER — VITAMIN D 25 MCG (1000 UNIT) PO TABS
5000.0000 [IU] | ORAL_TABLET | Freq: Every day | ORAL | Status: DC
  Administered 2018-03-29 – 2018-03-30 (×2): 5000 [IU] via ORAL
  Filled 2018-03-28 (×2): qty 5

## 2018-03-28 MED ORDER — METHOCARBAMOL 1000 MG/10ML IJ SOLN
500.0000 mg | Freq: Four times a day (QID) | INTRAVENOUS | Status: DC | PRN
  Filled 2018-03-28: qty 5

## 2018-03-28 MED ORDER — METHOCARBAMOL 500 MG PO TABS: 500.0000 mg | ORAL_TABLET | Freq: Four times a day (QID) | ORAL | Status: DC | PRN

## 2018-03-28 MED ORDER — SODIUM CHLORIDE 0.9 % IV SOLN
INTRAVENOUS | Status: DC | PRN
  Administered 2018-03-28: 60 mL

## 2018-03-28 MED ORDER — KETOROLAC TROMETHAMINE 15 MG/ML IJ SOLN
INTRAMUSCULAR | Status: AC
  Filled 2018-03-28: qty 1

## 2018-03-28 MED ORDER — PROPOFOL 500 MG/50ML IV EMUL
INTRAVENOUS | Status: DC | PRN
  Administered 2018-03-28: 25 ug/kg/min via INTRAVENOUS

## 2018-03-28 MED ORDER — SODIUM CHLORIDE 0.9 % IV SOLN
INTRAVENOUS | Status: DC
  Administered 2018-03-28 – 2018-03-29 (×2): via INTRAVENOUS

## 2018-03-28 MED ORDER — LACTATED RINGERS IV SOLN
INTRAVENOUS | Status: DC
  Administered 2018-03-28 (×2): via INTRAVENOUS

## 2018-03-28 MED ORDER — BUPIVACAINE-EPINEPHRINE (PF) 0.25% -1:200000 IJ SOLN
INTRAMUSCULAR | Status: DC | PRN
  Administered 2018-03-28: 30 mL

## 2018-03-28 MED ORDER — METOCLOPRAMIDE HCL 10 MG PO TABS: 5.0000 mg | ORAL_TABLET | Freq: Three times a day (TID) | ORAL | Status: DC | PRN

## 2018-03-28 MED ORDER — MENTHOL 3 MG MT LOZG: 1.0000 | LOZENGE | OROMUCOSAL | Status: DC | PRN

## 2018-03-28 MED ORDER — BUPIVACAINE-EPINEPHRINE (PF) 0.25% -1:200000 IJ SOLN
INTRAMUSCULAR | Status: AC
  Filled 2018-03-28: qty 30

## 2018-03-28 MED ORDER — KETOROLAC TROMETHAMINE 15 MG/ML IJ SOLN
15.0000 mg | Freq: Four times a day (QID) | INTRAMUSCULAR | Status: AC | PRN
  Administered 2018-03-28 – 2018-03-29 (×2): 15 mg via INTRAVENOUS
  Filled 2018-03-28: qty 1

## 2018-03-28 MED ORDER — BISACODYL 5 MG PO TBEC
5.0000 mg | DELAYED_RELEASE_TABLET | Freq: Every day | ORAL | Status: DC | PRN
  Filled 2018-03-28: qty 1

## 2018-03-28 MED ORDER — CEFAZOLIN SODIUM-DEXTROSE 2-4 GM/100ML-% IV SOLN
2.0000 g | Freq: Four times a day (QID) | INTRAVENOUS | Status: AC
  Administered 2018-03-28 – 2018-03-29 (×3): 2 g via INTRAVENOUS
  Filled 2018-03-28 (×3): qty 100

## 2018-03-28 MED ORDER — CALCIUM CITRATE-VITAMIN D 500-500 MG-UNIT PO CHEW
1.0000 | CHEWABLE_TABLET | Freq: Every day | ORAL | Status: DC
  Administered 2018-03-30: 1 via ORAL
  Filled 2018-03-28 (×3): qty 1

## 2018-03-28 MED ORDER — CEFAZOLIN SODIUM-DEXTROSE 2-4 GM/100ML-% IV SOLN
INTRAVENOUS | Status: AC
  Filled 2018-03-28: qty 100

## 2018-03-28 MED ORDER — FENTANYL CITRATE (PF) 100 MCG/2ML IJ SOLN: 25.0000 ug | INTRAMUSCULAR | Status: DC | PRN

## 2018-03-28 MED ORDER — GLYCOPYRROLATE 0.2 MG/ML IJ SOLN
INTRAMUSCULAR | Status: AC
  Filled 2018-03-28: qty 1

## 2018-03-28 MED ORDER — GENTAMICIN SULFATE 40 MG/ML IJ SOLN
INTRAMUSCULAR | Status: AC
  Filled 2018-03-28: qty 8

## 2018-03-28 MED ORDER — MIDAZOLAM HCL 5 MG/5ML IJ SOLN
INTRAMUSCULAR | Status: DC | PRN
  Administered 2018-03-28 (×2): 1 mg via INTRAVENOUS

## 2018-03-28 MED ORDER — MAGNESIUM CITRATE PO SOLN
1.0000 | Freq: Once | ORAL | Status: DC | PRN
  Filled 2018-03-28: qty 296

## 2018-03-28 MED ORDER — GABAPENTIN 300 MG PO CAPS
300.0000 mg | ORAL_CAPSULE | Freq: Three times a day (TID) | ORAL | Status: DC
  Administered 2018-03-28 – 2018-03-30 (×6): 300 mg via ORAL
  Filled 2018-03-28 (×6): qty 1

## 2018-03-28 MED ORDER — LIDOCAINE HCL (PF) 2 % IJ SOLN
INTRAMUSCULAR | Status: DC | PRN
  Administered 2018-03-28: 50 mg

## 2018-03-28 MED ORDER — PROPOFOL 500 MG/50ML IV EMUL
INTRAVENOUS | Status: AC
  Filled 2018-03-28: qty 50

## 2018-03-28 MED ORDER — LIDOCAINE HCL (PF) 2 % IJ SOLN
INTRAMUSCULAR | Status: AC
  Filled 2018-03-28: qty 10

## 2018-03-28 MED ORDER — GLYCOPYRROLATE 0.2 MG/ML IJ SOLN
INTRAMUSCULAR | Status: DC | PRN
  Administered 2018-03-28: 0.2 mg via INTRAVENOUS

## 2018-03-28 MED ORDER — MAGNESIUM HYDROXIDE 400 MG/5ML PO SUSP
30.0000 mL | Freq: Every day | ORAL | Status: DC | PRN
  Filled 2018-03-28 (×2): qty 30

## 2018-03-28 MED ORDER — FAMOTIDINE 20 MG PO TABS
20.0000 mg | ORAL_TABLET | Freq: Once | ORAL | Status: AC
  Administered 2018-03-28: 20 mg via ORAL

## 2018-03-28 MED ORDER — DOCUSATE SODIUM 100 MG PO CAPS
100.0000 mg | ORAL_CAPSULE | Freq: Two times a day (BID) | ORAL | Status: DC
  Administered 2018-03-28 – 2018-03-30 (×4): 100 mg via ORAL
  Filled 2018-03-28 (×4): qty 1

## 2018-03-28 MED ORDER — FAMOTIDINE 20 MG PO TABS
ORAL_TABLET | ORAL | Status: AC
  Administered 2018-03-28: 20 mg via ORAL
  Filled 2018-03-28: qty 1

## 2018-03-28 MED ORDER — ONDANSETRON HCL 4 MG/2ML IJ SOLN: 4.0000 mg | Freq: Once | INTRAMUSCULAR | Status: DC | PRN

## 2018-03-28 MED ORDER — DIPHENHYDRAMINE HCL 12.5 MG/5ML PO ELIX: 12.5000 mg | ORAL_SOLUTION | ORAL | Status: DC | PRN

## 2018-03-28 MED ORDER — ZOLPIDEM TARTRATE 5 MG PO TABS: 5.0000 mg | ORAL_TABLET | Freq: Every evening | ORAL | Status: DC | PRN

## 2018-03-28 MED ORDER — PHENYLEPHRINE HCL 10 MG/ML IJ SOLN
INTRAMUSCULAR | Status: AC
  Filled 2018-03-28: qty 1

## 2018-03-28 MED ORDER — MORPHINE SULFATE (PF) 2 MG/ML IV SOLN: 0.5000 mg | INTRAVENOUS | Status: DC | PRN

## 2018-03-28 MED ORDER — ONDANSETRON HCL 4 MG PO TABS: 4.0000 mg | ORAL_TABLET | Freq: Four times a day (QID) | ORAL | Status: DC | PRN

## 2018-03-28 SURGICAL SUPPLY — 72 items
APL PRP STRL LF DISP 70% ISPRP (MISCELLANEOUS) ×2
BANDAGE ACE 6X5 VEL STRL LF (GAUZE/BANDAGES/DRESSINGS) ×3 IMPLANT
BLADE SAW 1 (BLADE) ×3 IMPLANT
BLOCK CUTTING FEMUR 4 LT MED (MISCELLANEOUS) ×2 IMPLANT
BLOCK CUTTING TIBIAL 3 LT (MISCELLANEOUS) ×2 IMPLANT
CANISTER SUCT 1200ML W/VALVE (MISCELLANEOUS) ×3 IMPLANT
CANISTER SUCT 3000ML PPV (MISCELLANEOUS) ×6 IMPLANT
CANISTER WOUND CARE 500ML ATS (WOUND CARE) ×2 IMPLANT
CEMENT HV SMART SET (Cement) ×6 IMPLANT
CHLORAPREP W/TINT 26 (MISCELLANEOUS) ×6 IMPLANT
COOLER POLAR GLACIER W/PUMP (MISCELLANEOUS) ×3 IMPLANT
COVER WAND RF STERILE (DRAPES) ×3 IMPLANT
CUFF TOURN SGL QUICK 24 (TOURNIQUET CUFF)
CUFF TOURN SGL QUICK 30 (TOURNIQUET CUFF) ×3
CUFF TRNQT CYL 24X4X16.5-23 (TOURNIQUET CUFF) IMPLANT
CUFF TRNQT CYL 30X4X21-28X (TOURNIQUET CUFF) IMPLANT
DRAPE SHEET LG 3/4 BI-LAMINATE (DRAPES) ×6 IMPLANT
ELECT CAUTERY BLADE 6.4 (BLADE) ×3 IMPLANT
ELECT REM PT RETURN 9FT ADLT (ELECTROSURGICAL) ×3
ELECTRODE REM PT RTRN 9FT ADLT (ELECTROSURGICAL) ×1 IMPLANT
FEMORAL COMP SZ4 LEFT SPHERE (Femur) ×2 IMPLANT
FEMUR BONE MODEL 4.9010 MEDACT (MISCELLANEOUS) ×2 IMPLANT
GAUZE PETRO XEROFOAM 1X8 (MISCELLANEOUS) ×3 IMPLANT
GAUZE SPONGE 4X4 12PLY STRL (GAUZE/BANDAGES/DRESSINGS) ×3 IMPLANT
GLOVE BIOGEL PI IND STRL 9 (GLOVE) ×1 IMPLANT
GLOVE BIOGEL PI INDICATOR 9 (GLOVE) ×2
GLOVE INDICATOR 8.0 STRL GRN (GLOVE) ×3 IMPLANT
GLOVE SURG ORTHO 8.0 STRL STRW (GLOVE) ×3 IMPLANT
GLOVE SURG SYN 9.0  PF PI (GLOVE) ×2
GLOVE SURG SYN 9.0 PF PI (GLOVE) ×1 IMPLANT
GOWN SRG 2XL LVL 4 RGLN SLV (GOWNS) ×1 IMPLANT
GOWN STRL NON-REIN 2XL LVL4 (GOWNS) ×3
GOWN STRL REUS W/ TWL LRG LVL3 (GOWN DISPOSABLE) ×1 IMPLANT
GOWN STRL REUS W/ TWL XL LVL3 (GOWN DISPOSABLE) ×1 IMPLANT
GOWN STRL REUS W/TWL LRG LVL3 (GOWN DISPOSABLE) ×3
GOWN STRL REUS W/TWL XL LVL3 (GOWN DISPOSABLE) ×3
HOLDER FOLEY CATH W/STRAP (MISCELLANEOUS) ×3 IMPLANT
HOOD PEEL AWAY FLYTE STAYCOOL (MISCELLANEOUS) ×6 IMPLANT
INSERT TIBIAL SZ 4 12MM LEFT (Insert) ×2 IMPLANT
KIT PREVENA INCISION MGT 13 (CANNISTER) ×2 IMPLANT
KIT TURNOVER KIT A (KITS) ×3 IMPLANT
NDL SAFETY ECLIPSE 18X1.5 (NEEDLE) ×1 IMPLANT
NDL SPNL 18GX3.5 QUINCKE PK (NEEDLE) ×1 IMPLANT
NDL SPNL 20GX3.5 QUINCKE YW (NEEDLE) ×1 IMPLANT
NEEDLE HYPO 18GX1.5 SHARP (NEEDLE) ×3
NEEDLE SPNL 18GX3.5 QUINCKE PK (NEEDLE) ×3 IMPLANT
NEEDLE SPNL 20GX3.5 QUINCKE YW (NEEDLE) ×3 IMPLANT
NS IRRIG 1000ML POUR BTL (IV SOLUTION) ×3 IMPLANT
PACK TOTAL KNEE (MISCELLANEOUS) ×3 IMPLANT
PAD WRAPON POLAR KNEE (MISCELLANEOUS) ×1 IMPLANT
PATELLA RESURFACING MEDACTA 02 (Bone Implant) ×2 IMPLANT
PULSAVAC PLUS IRRIG FAN TIP (DISPOSABLE) ×3
SCALPEL PROTECTED #10 DISP (BLADE) ×6 IMPLANT
SOL .9 NS 3000ML IRR  AL (IV SOLUTION) ×2
SOL .9 NS 3000ML IRR AL (IV SOLUTION) ×1
SOL .9 NS 3000ML IRR UROMATIC (IV SOLUTION) ×1 IMPLANT
STAPLER SKIN PROX 35W (STAPLE) ×3 IMPLANT
STEM EXTENSION 11MMX30MM (Stem) ×2 IMPLANT
SUCTION FRAZIER HANDLE 10FR (MISCELLANEOUS) ×2
SUCTION TUBE FRAZIER 10FR DISP (MISCELLANEOUS) ×1 IMPLANT
SUT DVC 2 QUILL PDO  T11 36X36 (SUTURE) ×2
SUT DVC 2 QUILL PDO T11 36X36 (SUTURE) ×1 IMPLANT
SUT V-LOC 90 ABS DVC 3-0 CL (SUTURE) ×3 IMPLANT
SYR 20CC LL (SYRINGE) ×3 IMPLANT
SYR 50ML LL SCALE MARK (SYRINGE) ×6 IMPLANT
TIBIAL BONE MODEL LEFT (MISCELLANEOUS) ×2 IMPLANT
TIP FAN IRRIG PULSAVAC PLUS (DISPOSABLE) ×1 IMPLANT
TOWEL OR 17X26 4PK STRL BLUE (TOWEL DISPOSABLE) ×3 IMPLANT
TOWER CARTRIDGE SMART MIX (DISPOSABLE) ×3 IMPLANT
TRAY FOLEY MTR SLVR 16FR STAT (SET/KITS/TRAYS/PACK) ×3 IMPLANT
TRAY TIBIAL FIXED T3I4 LEFT (Miscellaneous) ×2 IMPLANT
WRAPON POLAR PAD KNEE (MISCELLANEOUS) ×3

## 2018-03-28 NOTE — Anesthesia Post-op Follow-up Note (Signed)
Anesthesia QCDR form completed.        

## 2018-03-28 NOTE — Progress Notes (Signed)
Ch visited w/ family of pt that was already in pre-op. Pt husband shared that the pt was here for knee surgery. Ch provided words of encouragement and said a silent prayer on behalf of the pt and family. F/u recommended.    03/28/18 0710  Clinical Encounter Type  Visited With Family  Visit Type Pre-op;Spiritual support;Social support  Spiritual Encounters  Spiritual Needs Emotional;Grief support  Stress Factors  Patient Stress Factors Health changes  Family Stress Factors None identified  Advance Directives (For Healthcare)  Does Patient Have a Medical Advance Directive? No  Would patient like information on creating a medical advance directive? No - Patient declined

## 2018-03-28 NOTE — Anesthesia Preprocedure Evaluation (Addendum)
Anesthesia Evaluation  Patient identified by MRN, date of birth, ID band Patient awake    Reviewed: Allergy & Precautions, NPO status , Patient's Chart, lab work & pertinent test results  History of Anesthesia Complications Negative for: history of anesthetic complications  Airway Mallampati: II  TM Distance: >3 FB Neck ROM: Full    Dental  (+) Poor Dentition   Pulmonary neg sleep apnea, neg COPD, former smoker,    breath sounds clear to auscultation- rhonchi (-) wheezing      Cardiovascular hypertension, Pt. on medications + CAD, + Past MI and + Cardiac Stents (2017)  (-) CABG  Rhythm:Regular Rate:Normal - Systolic murmurs and - Diastolic murmurs NM stress test: Negative Lexiscan stress. LV function normal. No evidence of reversible  ischemia.   Neuro/Psych neg Seizures negative neurological ROS  negative psych ROS   GI/Hepatic negative GI ROS, Neg liver ROS,   Endo/Other  negative endocrine ROSneg diabetes  Renal/GU negative Renal ROS     Musculoskeletal  (+) Arthritis ,   Abdominal (+) + obese,   Peds  Hematology negative hematology ROS (+)   Anesthesia Other Findings Past Medical History: No date: Arthritis     Comment:  bilateral knees April 2015: Cancer [facility])     Comment:  left breast No date: Coronary artery disease No date: Hypertension 11/03/2015: Myocardial infarction [facility])     Comment:  [facility], stent placed No date: Pneumonia   Reproductive/Obstetrics                            Lab Results  Component Value Date   WBC 4.3 03/15/2018   HGB 12.4 03/15/2018   HCT 36.3 03/15/2018   MCV 100.6 (H) 03/15/2018   PLT 308 03/15/2018    Anesthesia Physical Anesthesia Plan  ASA: III  Anesthesia Plan: Spinal   Post-op Pain Management:    Induction:   PONV Risk Score and Plan: 2 and Propofol infusion  Airway Management Planned: Natural Airway  Additional Equipment:    Intra-op Plan:   Post-operative Plan:   Informed Consent: I have reviewed the patients History and Physical, chart, labs and discussed the procedure including the risks, benefits and alternatives for the proposed anesthesia with the patient or authorized representative who has indicated his/her understanding and acceptance.     Dental advisory given  Plan Discussed with: CRNA and Anesthesiologist  Anesthesia Plan Comments:         Anesthesia Quick Evaluation

## 2018-03-28 NOTE — Care Management (Signed)
Requested Lovenox benefit check from [employer] [facility] via inbox

## 2018-03-28 NOTE — H&P (Signed)
Reviewed paper H+P, will be scanned into chart. No changes noted.  

## 2018-03-28 NOTE — NC FL2 (Signed)
[place] LEVEL OF CARE SCREENING TOOL     IDENTIFICATION  Patient Name: Faith [name] Birthdate: [birth year] Sex: female Admission Date (Current Location): 03/28/2018  [facility] and [place] Number:  Engineering geologist and Address:         Provider Number: [id number]  Attending Physician Name and Address:  Hessie Knows, MD  Relative Name and Phone Number:  Cire Deyarmin [phone]    Current Level of Care: Hospital Recommended Level of Care: [place] Prior Approval Number:    Date Approved/Denied:   PASRR Number:    Discharge Plan: SNF    Current Diagnoses: Patient Active Problem List   Diagnosis Date Noted  . Status post total knee replacement using cement, left 03/28/2018  . 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 female, estrogen receptor positive ([place]) 09/09/2015    Orientation RESPIRATION BLADDER Height & Weight     Self, Time, Situation, Place  Normal Continent Weight: 111.6 kg Height:  5' 6.5" (168.9 cm)  BEHAVIORAL SYMPTOMS/MOOD NEUROLOGICAL BOWEL NUTRITION STATUS  (NA) (NA) Continent Diet  AMBULATORY STATUS COMMUNICATION OF NEEDS Skin   Limited Assist   Surgical wounds                       Personal Care Assistance Level of Assistance  Bathing, Dressing Bathing Assistance: Limited assistance   Dressing Assistance: Limited assistance     Functional Limitations Info  (NA)          SPECIAL CARE FACTORS FREQUENCY  PT (By licensed PT), OT (By licensed OT)     PT Frequency: At least 5 times a week OT Frequency: At least 5 times a week            Contractures Contractures Info: Not present    Additional Factors Info                  Current Medications (03/28/2018):  This is the current hospital active medication list Current Facility-Administered Medications  Medication Dose Route Frequency Provider Last Rate Last Dose  . 0.9 %  sodium chloride infusion   Intravenous Continuous Hessie Knows, MD 75 mL/hr at 03/28/18 1603    . [START ON 03/29/2018] acetaminophen (TYLENOL) tablet 325-650 mg  325-650 mg Oral Q6H PRN Hessie Knows, MD      . acetaminophen (TYLENOL) tablet 500 mg  500 mg Oral Q6H Hessie Knows, MD   Stopped at 03/28/18 1900  . alum & mag hydroxide-simeth (MAALOX/MYLANTA) 200-200-20 MG/5ML suspension 30 mL  30 mL Oral Q4H PRN Hessie Knows, MD      . atorvastatin (LIPITOR) tablet 80 mg  80 mg Oral q1800 Hessie Knows, MD      . bisacodyl (DULCOLAX) EC tablet 5 mg  5 mg Oral Daily PRN Hessie Knows, MD      . calcium citrate-vitamin D 500-500 MG-UNIT per chewable tablet 1 tablet  1 tablet Oral Daily Hessie Knows, MD      . ceFAZolin (ANCEF) IVPB 2g/100 mL premix  2 g Intravenous Q6H Hessie Knows, MD   Stopped at 03/28/18 1537  . cholecalciferol (VITAMIN D3) tablet 5,000 Units  5,000 Units Oral Daily Hessie Knows, MD      . diphenhydrAMINE (BENADRYL) 12.5 MG/5ML elixir 12.5-25 mg  12.5-25 mg Oral Q4H PRN Hessie Knows, MD      . docusate sodium (COLACE) capsule 100 mg  100 mg Oral BID Hessie Knows, MD      . [  START ON 03/29/2018] enoxaparin (LOVENOX) injection 30 mg  30 mg Subcutaneous Q12H Hessie Knows, MD      . gabapentin (NEURONTIN) capsule 300 mg  300 mg Oral TID Hessie Knows, MD   300 mg at 03/28/18 1647  . HYDROcodone-acetaminophen (NORCO) 7.5-325 MG per tablet 1-2 tablet  1-2 tablet Oral Q4H PRN Hessie Knows, MD   1 tablet at 03/28/18 1457  . HYDROcodone-acetaminophen (NORCO/VICODIN) 5-325 MG per tablet 1-2 tablet  1-2 tablet Oral Q4H PRN Hessie Knows, MD      . ketorolac (TORADOL) 15 MG/ML injection 15 mg  15 mg Intravenous Q6H PRN Hessie Knows, MD   15 mg at 03/28/18 1647  . losartan (COZAAR) tablet 25 mg  25 mg Oral Daily Hessie Knows, MD      . magnesium citrate solution 1 Bottle  1 Bottle Oral Once PRN Hessie Knows, MD      . magnesium hydroxide (MILK OF MAGNESIA) suspension 30 mL  30 mL Oral Daily PRN Hessie Knows, MD      .  menthol-cetylpyridinium (CEPACOL) lozenge 3 mg  1 lozenge Oral PRN Hessie Knows, MD       Or  . phenol (CHLORASEPTIC) mouth spray 1 spray  1 spray Mouth/Throat PRN Hessie Knows, MD      . methocarbamol (ROBAXIN) tablet 500 mg  500 mg Oral Q6H PRN Hessie Knows, MD       Or  . methocarbamol (ROBAXIN) 500 mg in dextrose 5 % 50 mL IVPB  500 mg Intravenous Q6H PRN Hessie Knows, MD      . metoCLOPramide (REGLAN) tablet 5-10 mg  5-10 mg Oral Q8H PRN Hessie Knows, MD       Or  . metoCLOPramide (REGLAN) injection 5-10 mg  5-10 mg Intravenous Q8H PRN Hessie Knows, MD      . metoprolol tartrate (LOPRESSOR) tablet 25 mg  25 mg Oral BID Hessie Knows, MD      . morphine 2 MG/ML injection 0.5-1 mg  0.5-1 mg Intravenous Q2H PRN Hessie Knows, MD      . ondansetron [facility]) tablet 4 mg  4 mg Oral Q6H PRN Hessie Knows, MD       Or  . ondansetron [facility]) injection 4 mg  4 mg Intravenous Q6H PRN Hessie Knows, MD      . traMADol [facility]) tablet 50 mg  50 mg Oral Q6H Hessie Knows, MD   50 mg at 03/28/18 1649  . zolpidem (AMBIEN) tablet 5 mg  5 mg Oral QHS PRN Hessie Knows, MD       Facility-Administered Medications Ordered in Other Encounters  Medication Dose Route Frequency Provider Last Rate Last Dose  . sodium chloride 0.9 % injection 10 mL  10 mL Intravenous PRN [name], MD   10 mL at 09/12/14 1014     Discharge Medications: Please see discharge summary for a list of discharge medications.  Relevant Imaging Results:  Relevant Lab Results:   Additional Information    Katrina Stack, RN

## 2018-03-28 NOTE — TOC Benefit Eligibility Note (Signed)
Transition of Care [facility]) Benefit Eligibility Note    Patient Details  Name: Faith [name] MRN: [id number] Date of Birth: 08/11/1945   Medication/Dose: Lovenox 40 mg once daily x 14 days  Covered?: Yes  Tier: Other  Prescription Coverage Preferred Pharmacy[facility], [place] with Person/Company/Phone Number:: Delana Meyer [phone]  Co-Pay: $8.95  Prior Approval: No  Deductible: Met  Additional Notes: Specialty [employer] [place] A [place]elman Phone Number: [phone] 03/28/2018, 2:39 PM

## 2018-03-28 NOTE — Anesthesia Procedure Notes (Signed)
Spinal  Patient location during procedure: OR Staffing Anesthesiologist: Emmie Niemann, MD Resident/CRNA: Rolla Plate, CRNA Performed: resident/CRNA  Preanesthetic Checklist Completed: patient identified, site marked, surgical consent, pre-op evaluation, timeout performed, IV checked, risks and benefits discussed and monitors and equipment checked Spinal Block Patient position: sitting Prep: ChloraPrep and site prepped and draped Patient monitoring: heart rate, continuous pulse ox, blood pressure and cardiac monitor Approach: midline Location: L4-5 Injection technique: single-shot Needle Needle type: Introducer and Pencan  Needle gauge: 24 G Needle length: 9 cm Additional Notes Negative paresthesia. Negative blood return. Positive free-flowing CSF. Expiration date of kit checked and confirmed. Patient tolerated procedure well, without complications.

## 2018-03-28 NOTE — Progress Notes (Signed)
Patient arrived in PACU with pillow under left surgical knee. Confirmed with Dr. Rudene Christians was this needed. Per Dr. Rudene Christians removed pillow.

## 2018-03-28 NOTE — Transfer of Care (Signed)
Immediate Anesthesia Transfer of Care Note  Patient: Faith [name] [facility]  Procedure(s) Performed: TOTAL KNEE ARTHROPLASTY-LEFT (Left )  Patient Location: PACU  Anesthesia Type:Spinal  Level of Consciousness: awake, alert  and oriented  Airway & Oxygen Therapy: Patient Spontanous Breathing  Post-op Assessment: Report given to RN and Post -op Vital signs reviewed and stable  Post vital signs: Reviewed  Last Vitals:  Vitals Value Taken Time  BP 112/51 03/28/2018 10:41 AM  Temp    Pulse 78 03/28/2018 10:41 AM  Resp 18 03/28/2018 10:41 AM  SpO2 100 % 03/28/2018 10:41 AM  Vitals shown include unvalidated device data.  Last Pain:  Vitals:   03/28/18 0708  TempSrc: Tympanic  PainSc: 8          Complications: No apparent anesthesia complications

## 2018-03-28 NOTE — Evaluation (Signed)
Physical Therapy Evaluation Patient Details Name: Faith [name] MRN: [id number] DOB: March 25, 1945 Today's Date: 03/28/2018   History of Present Illness  Pt is a 73 y.o. female s/p L TKA secondary OA 03/28/18.  PMH includes htn, CAD, h/o MI, cardiac stents, L breast CA, and B knee arthritis.    Clinical Impression  Prior to hospital admission, pt was independent.  Pt lives with her husband in 1 level home with 2 steps (no railing) to enter.  Pt reporting 10/10 L knee pain upon PT entering room, but requesting to try PT (pt wanted to move L knee and attempt mobility despite pain; pt received pain meds recently); of note upon therapist entering pt's room, pt's polar care noted to be not plugged in to outlet (nurse notified).  Pt mod to max assist with bed mobility and close SBA for sitting balance.  Pt initially appearing fairly good sitting edge of bed but then pt appearing pale and reporting being nauseas and then that she was going to "pass out" so pt assisted back to bed.  BP 111/54 with HR 56 bpm and O2 sats 100% on room air.  10/10 L knee pain end of session (polar care applied and working prior to therapist leaving room); nurse notified of pt's pain and reported plan for more pain medications.  Despite pain, pt reporting being glad she at least sat on edge of bed today.  Pt would benefit from skilled PT to address noted impairments and functional limitations (see below for any additional details).  Upon hospital discharge, currently recommend pt discharge to STR.    Follow Up Recommendations SNF    Equipment Recommendations  Rolling walker with 5" wheels;3in1 (PT)    Recommendations for Other Services OT consult     Precautions / Restrictions Precautions Precautions: Fall;Knee Precaution Comments: No BP L UE; wound vac Restrictions Weight Bearing Restrictions: Yes LLE Weight Bearing: Weight bearing as tolerated      Mobility  Bed Mobility Overal bed mobility: Needs Assistance Bed  Mobility: Supine to Sit;Sit to Supine     Supine to sit: Mod assist;Max assist;HOB elevated Sit to supine: Max assist;HOB elevated   General bed mobility comments: assist for L LE and trunk semi-supine to/from sit; assist to scoot up in bed  Transfers                 General transfer comment: Deferred d/t significant L knee pain, nausea, and pt reporting feeling like she was going to pass out sitting on edge of bed  Ambulation/Gait             General Gait Details: Deferred d/t significant L knee pain, nausea, and pt reporting feeling like she was going to pass out sitting on edge of bed  Stairs            Wheelchair Mobility    Modified Rankin (Stroke Patients Only)       Balance Overall balance assessment: Needs assistance Sitting-balance support: Single extremity supported;Feet supported Sitting balance-Leahy Scale: Poor Sitting balance - Comments: pt requiring at least single UE support for static sitting balance       Standing balance comment: standing not assessed (see mobility section for details)                             Pertinent Vitals/Pain Pain Assessment: 0-10 Pain Score: 10-Worst pain ever Pain Location: L knee Pain Descriptors / Indicators: Sharp;Sore;Tender;Operative site guarding;Guarding;Grimacing;Constant  Pain Intervention(s): Limited activity within patient's tolerance;Monitored during session;Premedicated before session;Repositioned;Patient requesting pain meds-RN notified;Other (comment)(polar care applied and activated)     Home Living Family/patient expects to be discharged to:: Private residence Living Arrangements: Spouse/significant other Available Help at Discharge: Family Type of Home: House Home Access: Stairs to enter Entrance Stairs-Rails: None Entrance Stairs-Number of Steps: 2 Home Layout: One level Home Equipment: Shower seat - built in;Grab bars - tub/shower;Cane - single point;Walker - 2  wheels;Bedside commode      Prior Function Level of Independence: Independent               Hand Dominance        Extremity/Trunk Assessment   Upper Extremity Assessment Upper Extremity Assessment: Overall WFL for tasks assessed    Lower Extremity Assessment Lower Extremity Assessment: RLE deficits/detail;LLE deficits/detail RLE Deficits / Details: strength and ROM WFL LLE Deficits / Details: fair L quad set; at least 3/5 DF/PF AROM LLE: Unable to fully assess due to pain    Cervical / Trunk Assessment Cervical / Trunk Assessment: Normal  Communication   Communication: No difficulties  Cognition Arousal/Alertness: Awake/alert Behavior During Therapy: WFL for tasks assessed/performed Overall Cognitive Status: Within Functional Limits for tasks assessed                                        General Comments General comments (skin integrity, edema, etc.): L knee dressing in place with polar care over dressing.  Pt agreeable to PT session.     Exercises Total Joint Exercises Ankle Circles/Pumps: AROM;Strengthening;Both;10 reps;Supine Quad Sets: AROM;Strengthening;Both;10 reps;Supine Heel Slides: AAROM;Strengthening;Left(5 reps) Hip ABduction/ADduction: AAROM;Strengthening;Left(6 reps) Goniometric ROM: L knee extension 15 degrees short of neutral (limited d/t pain); L knee flexion ROM deferred d/t significant L knee pain (MD Menz present, notified, and cleared PT to wait until tomorrow to assess L knee ROM)   Assessment/Plan    PT Assessment Patient needs continued PT services  PT Problem List Decreased strength;Decreased range of motion;Decreased activity tolerance;Decreased balance;Decreased mobility;Decreased knowledge of use of DME;Decreased knowledge of precautions;Pain;Decreased skin integrity       PT Treatment Interventions DME instruction;Gait training;Stair training;Functional mobility training;Therapeutic activities;Therapeutic  exercise;Balance training;Patient/family education    PT Goals (Current goals can be found in the Care Plan section)  Acute Rehab PT Goals Patient Stated Goal: to have less pain PT Goal Formulation: With patient Time For Goal Achievement: 04/11/18 Potential to Achieve Goals: Fair    Frequency BID   Barriers to discharge Decreased caregiver support      Co-evaluation               AM-PAC PT "6 Clicks" Mobility  Outcome Measure Help needed turning from your back to your side while in a flat bed without using bedrails?: A Little Help needed moving from lying on your back to sitting on the side of a flat bed without using bedrails?: A Lot Help needed moving to and from a bed to a chair (including a wheelchair)?: Total Help needed standing up from a chair using your arms (e.g., wheelchair or bedside chair)?: Total Help needed to walk in hospital room?: Total Help needed climbing 3-5 steps with a railing? : Total 6 Click Score: 9    End of Session Equipment Utilized During Treatment: Gait belt Activity Tolerance: Patient limited by pain Patient left: in bed;with call bell/phone within reach;with bed alarm set;with SCD's  reapplied;Other (comment)(B heels elevated via towel rolls; polar care in place and activated) Nurse Communication: Mobility status;Patient requests pain meds;Precautions;Weight bearing status PT Visit Diagnosis: Other abnormalities of gait and mobility (R26.89);Muscle weakness (generalized) (M62.81);Difficulty in walking, not elsewhere classified (R26.2);Pain Pain - Right/Left: Left Pain - part of body: Knee    Time: 2820-8138 PT Time Calculation (min) (ACUTE ONLY): 42 min   Charges:   PT Evaluation $PT Eval Low Complexity: 1 Low PT Treatments $Therapeutic Exercise: 8-22 mins $Therapeutic Activity: 8-22 mins        Leitha Bleak, PT 03/28/18, 4:51 PM [phone]

## 2018-03-28 NOTE — Op Note (Signed)
03/28/2018  10:47 AM  PATIENT:  Faith [name]  73 y.o. female  PRE-OPERATIVE DIAGNOSIS:  PRIMARY LOCALIZED OSTEOARTHRITIS OF LEFT KNEE  POST-OPERATIVE DIAGNOSIS:  osteoarthritis left knee  PROCEDURE:  Procedure(s): TOTAL KNEE ARTHROPLASTY-LEFT (Left)  SURGEON: Laurene Footman, MD  ASSISTANTS: Rachelle Hora, PA-C  ANESTHESIA:   spinal  EBL:  Total I/O In: 1000 [I.V.:1000] Out: 300 [Urine:200; Blood:100]  BLOOD ADMINISTERED:none  DRAINS: none   LOCAL MEDICATIONS USED:  MARCAINE    and OTHER morphine and Exparel  SPECIMEN:  No Specimen  DISPOSITION OF SPECIMEN:  N/A  COUNTS:  YES  TOURNIQUET: 28 minutes at 300 mmHg  IMPLANTS: Medacta GM K sphere system, left 4 femur, 3 TI 4 tibial component, 12 mm insert with size 2 patella, short stem for tibia, all components cemented  DICTATION: .Dragon Dictation   patient was brought to the operating room and after adequate spinal anesthesia was obtained the  left leg was prepped and draped in the usual sterile fashion. After patient identification and timeout procedures were completed, midline skin incision was made followed by medial parapatellar arthrotomy. There is exposed bone throughout the entire medial femoral condyle withsevere patellofemoral and mildlateral compartment degenerative changes but no exposed bone. The ACL and fat pad were excised off the anterior into the meniscus in the proximal tibia cutting guide from the [facility] was applied proximal tibia cut carried out. The distal femoral cut was carried out in a similar fashion and the4 cutting guide applied with anterior posterior and chamfer cuts made. The posterior horns of the menisci were removed at this point. The3t/i4 tibia baseplate trial was placed pinned into position and proximal tibial preparation carried out with drilling hand reaming and the keel punch followed by placement of the 73femur and sizing the tibial insert size 12 gave the best fit with  stability and full extension. The distal femoral drill holes were made in the notch cut for the trochlear groove was then carried out with trials were then removed the patella was cut using the patellar cutting guide and it sized to a size2, afterdrill holes have been made.  Tourniquet was raised to this point. At this point the above local was infiltrated in the periarticular tissues and periosteum. The knee was irrigated with pulsatile lavage and the bony surfaces dried the tibial component was cemented into place first. Excess cement was removed and the polyethylene insert placed with a torque screw placed with a torque screwdriver tightened. The distal femoral component was placed and the knee was held in extension as the patellar button was clamped into place. After the cement was set excess cement was removed and the knee was again irrigated thoroughly thoroughly irrigated. The tourniquet was let down and hemostasis checked electrocautery. The arthrotomy was repaired with a heavy Quill suture followed by 3-0 V lock subcuticular closure, skin staples, incisional wound VAC secondary to the extensive edema present at the start of the casel and Ace wrap and Polar Care  PLAN OF CARE: Admit to inpatient   PATIENT DISPOSITION:  PACU - hemodynamically stable.

## 2018-03-29 LAB — BASIC METABOLIC PANEL
ANION GAP: 7 (ref 5–15)
BUN: 24 mg/dL — ABNORMAL HIGH (ref 8–23)
CO2: 23 mmol/L (ref 22–32)
Calcium: 8.9 mg/dL (ref 8.9–10.3)
Chloride: 106 mmol/L (ref 98–111)
Creatinine, Ser: 1 mg/dL (ref 0.44–1.00)
GFR calc Af Amer: >60 mL/min (ref >60)
GFR calc non Af Amer: 55 mL/min — ABNORMAL LOW (ref >60)
Glucose, Bld: 168 mg/dL — ABNORMAL HIGH (ref 70–99)
Potassium: 4.3 mmol/L (ref 3.5–5.1)
Sodium: 136 mmol/L (ref 135–145)

## 2018-03-29 LAB — CBC
HEMATOCRIT: 29.1 % — AB (ref 36.0–46.0)
Hemoglobin: 9.7 g/dL — ABNORMAL LOW (ref 12.0–15.0)
MCH: 33.7 pg (ref 26.0–34.0)
MCHC: 33.3 g/dL (ref 30.0–36.0)
MCV: 101 fL — ABNORMAL HIGH (ref 80.0–100.0)
Platelets: 181 10*3/uL (ref 150–400)
RBC: 2.88 MIL/uL — ABNORMAL LOW (ref 3.87–5.11)
RDW: 13 % (ref 11.5–15.5)
WBC: 7.1 10*3/uL (ref 4.0–10.5)
nRBC: 0 % (ref 0.0–0.2)

## 2018-03-29 MED ORDER — FE FUMARATE-B12-VIT C-FA-IFC PO CAPS
1.0000 | ORAL_CAPSULE | Freq: Two times a day (BID) | ORAL | Status: DC
  Administered 2018-03-29 – 2018-03-30 (×3): 1 via ORAL
  Filled 2018-03-29 (×4): qty 1

## 2018-03-29 NOTE — Progress Notes (Signed)
Physical Therapy Treatment Patient Details Name: Faith [name] MRN: [id number] DOB: 08-04-1945 Today's Date: 03/29/2018    History of Present Illness Pt is a 73 y.o. female s/p L TKA secondary OA 03/28/18.  PMH includes htn, CAD, h/o MI, cardiac stents, L breast CA, and B knee arthritis.      PT Comments    Pt reporting L knee pain significantly better (1.5/10 beginning of session and 1/10 end of session resting in chair).  Pt able to progress to ambulating 40 feet with RW CGA (vc's required for L quad set during L LE stance phase and also increasing UE support through RW during L LE stance phase d/t quad weakness).  L knee flexion to 90 degrees.  Will continue to progress pt with strengthening, ROM, and progressive functional mobility per pt tolerance.    Follow Up Recommendations  Home health PT     Equipment Recommendations  Rolling walker with 5" wheels;3in1 (PT)    Recommendations for Other Services OT consult     Precautions / Restrictions Precautions Precautions: Fall;Knee Precaution Booklet Issued: Yes (comment) Precaution Comments: No BP L UE; wound vac Restrictions Weight Bearing Restrictions: Yes LLE Weight Bearing: Weight bearing as tolerated    Mobility  Bed Mobility Overal bed mobility: Needs Assistance Bed Mobility: Supine to Sit     Supine to sit: Min assist;HOB elevated     General bed mobility comments: assist for L LE; increased effort and time for pt to perform and to scoot to edge of bed; vc's for technique  Transfers Overall transfer level: Needs assistance Equipment used: Rolling walker (2 wheeled) Transfers: Sit to/from [facility] Sit to Stand: Min assist Stand pivot transfers: Min guard       General transfer comment: assist to initiate stand and come to full upright posture from bed and from recliner; vc's for UE and LE placement and overall technique  Ambulation/Gait Ambulation/Gait assistance: Min guard Gait Distance  (Feet): 40 Feet Assistive device: Rolling walker (2 wheeled) Gait Pattern/deviations: Step-to pattern;Antalgic Gait velocity: decreased   General Gait Details: mild L knee flexion noted in L LE stance phase requiring cueing to increase UE support through RW and also L quad set during L LE stance phase to improve stability of L knee; vc's for walker use and positioning within walker   Stairs             Wheelchair Mobility    Modified Rankin (Stroke Patients Only)       Balance Overall balance assessment: Needs assistance Sitting-balance support: No upper extremity supported;Feet supported Sitting balance-Leahy Scale: Good Sitting balance - Comments: steady sitting reaching within BOS   Standing balance support: Single extremity supported Standing balance-Leahy Scale: Poor Standing balance comment: pt requiring at least single UE support for static standing balance                            Cognition Arousal/Alertness: Awake/alert Behavior During Therapy: WFL for tasks assessed/performed Overall Cognitive Status: Within Functional Limits for tasks assessed                                        Exercises Total Joint Exercises Ankle Circles/Pumps: AROM;Strengthening;Both;10 reps;Supine Quad Sets: AROM;Strengthening;Both;10 reps;Supine Short Arc Quad: AAROM;Strengthening;Left;10 reps;Supine Heel Slides: AAROM;Strengthening;Left;10 reps;Supine Hip ABduction/ADduction: AAROM;Strengthening;Left;10 reps;Supine Straight Leg Raises: AAROM;Strengthening;Left;10 reps;Supine Goniometric ROM: L  knee extension 10 degrees short of neutral semi-supine in bed; L knee flexion AAROM to 90 degrees sitting edge of recliner    General Comments General comments (skin integrity, edema, etc.): L knee dressing in place (mild dried blood noted through bandage) with polar care over dressing; wound vac in place.  Pt agreeable to PT session.      Pertinent  Vitals/Pain Pain Assessment: 0-10 Pain Score: 1  Pain Location: L knee Pain Descriptors / Indicators: Sore Pain Intervention(s): Limited activity within patient's tolerance;Monitored during session;Premedicated before session;Repositioned;Other (comment)(polar care applied and activated)  Vitals (HR and O2 on room air) stable and WFL throughout treatment session.    Home Living                      Prior Function            PT Goals (current goals can now be found in the care plan section) Acute Rehab PT Goals Patient Stated Goal: to have less pain PT Goal Formulation: With patient Time For Goal Achievement: 04/11/18 Potential to Achieve Goals: Good Progress towards PT goals: Progressing toward goals    Frequency    BID      PT Plan Discharge plan needs to be updated(CM notified)    Co-evaluation              AM-PAC PT "6 Clicks" Mobility   Outcome Measure  Help needed turning from your back to your side while in a flat bed without using bedrails?: A Little Help needed moving from lying on your back to sitting on the side of a flat bed without using bedrails?: A Little Help needed moving to and from a bed to a chair (including a wheelchair)?: A Little Help needed standing up from a chair using your arms (e.g., wheelchair or bedside chair)?: A Little Help needed to walk in hospital room?: A Little Help needed climbing 3-5 steps with a railing? : A Lot 6 Click Score: 17    End of Session Equipment Utilized During Treatment: Gait belt Activity Tolerance: Patient tolerated treatment well Patient left: in chair;with call bell/phone within reach;with chair alarm set;with nursing/sitter in room;Other (comment)(polar care in place and activated; NT present to give pt bath and reported she would finish setting pt up when she was done) Nurse Communication: Mobility status;Precautions;Weight bearing status PT Visit Diagnosis: Other abnormalities of gait and  mobility (R26.89);Muscle weakness (generalized) (M62.81);Difficulty in walking, not elsewhere classified (R26.2);Pain Pain - Right/Left: Left Pain - part of body: Knee     Time: 5366-4403 PT Time Calculation (min) (ACUTE ONLY): 47 min  Charges:  $Gait Training: 8-22 mins $Therapeutic Exercise: 8-22 mins $Therapeutic Activity: 8-22 mins                    Leitha Bleak, PT 03/29/18, 11:34 AM [phone]

## 2018-03-29 NOTE — Progress Notes (Signed)
OT Cancellation Note  Patient Details Name: KUUIPO ANZALDO MRN: [id number] DOB: 03-10-1945   Cancelled Treatment:    Reason Eval/Treat Not Completed: Other (comment). OT consult received, chart reviewed. Pt working with PT upon initial attempt. Will re-attempt this afternoon for OT evaluation.   Jeni Salles, MPH, MS, OTR/L ascom [phone] 03/29/18, 10:49 AM

## 2018-03-29 NOTE — Progress Notes (Signed)
   Subjective: 1 Day Post-Op Procedure(s) (LRB): TOTAL KNEE ARTHROPLASTY-LEFT (Left) Patient reports pain as 3 on 0-10 scale.   Patient is well, and has had no acute complaints or problems Denies any CP, SOB, ABD pain. We will continue therapy today.  Plan is to go Home after hospital stay.  Objective: Vital signs in last 24 hours: Temp:  [97.1 F (36.2 C)-98.6 F (37 C)] 98.3 F (36.8 C) (03/11 0724) Pulse Rate:  [54-86] 64 (03/11 0724) Resp:  [11-21] 19 (03/11 0416) BP: (109-154)/(42-58) 127/44 (03/11 0724) SpO2:  [95 %-100 %] 95 % (03/11 0724)  Intake/Output from previous day: 03/10 0701 - 03/11 0700 In: 2943.5 [P.O.:240; I.V.:2403.5; IV Piggyback:300] Out: 1400 [Urine:1300; Blood:100] Intake/Output this shift: No intake/output data recorded.  Recent Labs    03/28/18 1254 03/29/18 0436  HGB 10.6* 9.7*   Recent Labs    03/28/18 1254 03/29/18 0436  WBC 7.0 7.1  RBC 3.14* 2.88*  HCT 31.7* 29.1*  PLT 191 181   Recent Labs    03/28/18 1254 03/29/18 0436  NA  --  136  K  --  4.3  CL  --  106  CO2  --  23  BUN  --  24*  CREATININE 0.78 1.00  GLUCOSE  --  168*  CALCIUM  --  8.9   No results for input(s): LABPT, INR in the last 72 hours.  EXAM General - Patient is Alert, Appropriate and Oriented Extremity - Neurovascular intact Sensation intact distally Intact pulses distally Dorsiflexion/Plantar flexion intact No cellulitis present Compartment soft Dressing - dressing C/D/I and no drainage Praveena intact without drainage Motor Function - intact, moving foot and toes well on exam.   Past Medical History:  Diagnosis Date  . Arthritis    bilateral knees  . Cancer [facility]) April 2015   left breast  . Coronary artery disease   . Hypertension   . Myocardial infarction ([place]) 11/03/2015   [facility], stent placed  . Pneumonia     Assessment/Plan:   1 Day Post-Op Procedure(s) (LRB): TOTAL KNEE ARTHROPLASTY-LEFT (Left) Active Problems:   Status post  total knee replacement using cement, left  Estimated body mass index is 39.11 kg/m as calculated from the following:   Height as of this encounter: 5' 6.5" (1.689 m).   Weight as of this encounter: 111.6 kg. Advance diet Up with therapy  Acute post op blood loss anemia -hemoglobin 9.7.  Will start iron Recheck labs in the morning Needs bowel movement Care management to assist with discharge  DVT Prophylaxis - Lovenox, Foot Pumps and TED hose Weight-Bearing as tolerated to left leg   T. Rachelle Hora, PA-C [place] 03/29/2018, 8:12 AM

## 2018-03-29 NOTE — Anesthesia Postprocedure Evaluation (Signed)
Anesthesia Post Note  Patient: Evin Chirco [facility]  Procedure(s) Performed: TOTAL KNEE ARTHROPLASTY-LEFT (Left )  Patient location during evaluation: Nursing Unit Anesthesia Type: Spinal Level of consciousness: awake, awake and alert and oriented Pain management: pain level controlled Vital Signs Assessment: post-procedure vital signs reviewed and stable Respiratory status: spontaneous breathing, nonlabored ventilation and respiratory function stable Cardiovascular status: blood pressure returned to baseline and stable Postop Assessment: no headache and no backache Anesthetic complications: no     Last Vitals:  Vitals:   03/29/18 0416 03/29/18 0724  BP: (!) 110/54 (!) 127/44  Pulse: 61 64  Resp: 19   Temp: 36.6 C 36.8 C  SpO2: 97% 95%    Last Pain:  Vitals:   03/29/18 0956  TempSrc:   PainSc: 0-No pain                 Johnna Acosta

## 2018-03-29 NOTE — Evaluation (Signed)
Occupational Therapy Evaluation Patient Details Name: Faith [name] MRN: [id number] DOB: [birth year] Today's Date: 03/29/2018    History of Present Illness Pt is a 73 y.o. female s/p L TKA secondary OA 03/28/18.  PMH includes htn, CAD, h/o MI, cardiac stents, L breast CA, and B knee arthritis.     Clinical Impression   Pt seen for OT evaluation this date, POD#1 from above surgery. due to L knee pain. Pt/spouse wrere eager to return to PLOF with less pain and improved safety and independence. Pt currently requires minimal assist for LB dressing while in seated position due to pain and limited AROM of L knee. Pt/spouse instructed in polar care mgt, falls prevention strategies, home/routines modifications, DME/AE for LB bathing and dressing tasks, and compression stocking mgt. Pt would benefit from skilled OT services including additional instruction in dressing techniques with or without assistive devices for dressing and bathing skills to support recall and carryover prior to discharge and ultimately to maximize safety, independence, and minimize falls risk and caregiver burden. Do not currently anticipate any further skilled OT needs at this time including following this hospitalization. Will sign off. Please re-consult should there be a change in pt status.     Follow Up Recommendations  No OT follow up;Supervision - Intermittent    Equipment Recommendations  Other (comment)(reacher)    Recommendations for Other Services       Precautions / Restrictions Precautions Precautions: Fall;Knee Precaution Booklet Issued: Yes (comment) Precaution Comments: No BP L UE; wound vac Restrictions Weight Bearing Restrictions: Yes LLE Weight Bearing: Weight bearing as tolerated      Mobility Bed Mobility Overal bed mobility: Needs Assistance Bed Mobility: Sit to Supine     Sit to supine: Min assist   General bed mobility comments: Min A for LLE mgt back to bed  Transfers Overall transfer  level: Needs assistance Equipment used: Rolling walker (2 wheeled) Transfers: Sit to/from Stand Sit to Stand: Min guard       General transfer comment: occasional vc's for UE and LE placement and overall technique; increased effort to stand on own; CGA for safety    Balance Overall balance assessment: Needs assistance Sitting-balance support: No upper extremity supported;Feet supported Sitting balance-Leahy Scale: Good Sitting balance - Comments: steady sitting reaching within BOS   Standing balance support: No upper extremity supported Standing balance-Leahy Scale: Good                           ADL either performed or assessed with clinical judgement   ADL Overall ADL's : Needs assistance/impaired Eating/Feeding: Sitting;Independent   Grooming: Sitting;Independent   Upper Body Bathing: Sitting;Set up;Supervision/ safety   Lower Body Bathing: Sit to/from stand;Minimal assistance;Moderate assistance   Upper Body Dressing : Sitting;Set up;Supervision/safety   Lower Body Dressing: Sit to/from stand;Minimal assistance;Moderate assistance   Toilet Transfer: RW;Ambulation;BSC;Min guard                   Vision Patient Visual Report: No change from baseline       Perception     Praxis      Pertinent Vitals/Pain Pain Assessment: No/denies pain Pain Score: 0-No pain Pain Location: L knee Pain Intervention(s): Monitored during session;Repositioned;Ice applied     Hand Dominance     Extremity/Trunk Assessment Upper Extremity Assessment Upper Extremity Assessment: Overall WFL for tasks assessed(noted baseline lymphedema in LUE)   Lower Extremity Assessment Lower Extremity Assessment: RLE deficits/detail;LLE deficits/detail RLE Deficits /  Details: strength and ROM WFL LLE Deficits / Details: fair L quad set; at least 3/5 DF/PF AROM LLE: Unable to fully assess due to pain   Cervical / Trunk Assessment Cervical / Trunk Assessment: Normal    Communication Communication Communication: No difficulties   Cognition Arousal/Alertness: Awake/alert Behavior During Therapy: WFL for tasks assessed/performed Overall Cognitive Status: Within Functional Limits for tasks assessed                                     General Comments  L knee dressing in place with polar care over dressing and wound vac in place    Exercises Other Exercises Other Exercises: pt/spouse instructed in polar care mgt, compression stocking mgt, AE/DME for ADL, self care skills, home/routines modifications, and falls prevention.   Shoulder Instructions      Home Living Family/patient expects to be discharged to:: Private residence Living Arrangements: Spouse/significant other Available Help at Discharge: Family Type of Home: House Home Access: Stairs to enter Technical brewer of Steps: 2 Entrance Stairs-Rails: None Home Layout: One level     Bathroom Shower/Tub: Occupational psychologist: [place]: Shower seat - built in;Grab bars - tub/shower;Cane - single point;Walker - 2 wheels;Bedside commode          Prior Functioning/Environment Level of Independence: Independent                 OT Problem List: Decreased strength;Decreased range of motion;Decreased knowledge of use of DME or AE;Impaired balance (sitting and/or standing);Obesity      OT Treatment/Interventions: Self-care/ADL training;Balance training;Therapeutic exercise;Therapeutic activities;DME and/or AE instruction;Patient/family education    OT Goals(Current goals can be found in the care plan section) Acute Rehab OT Goals Patient Stated Goal: to go home and recover OT Goal Formulation: All assessment and education complete, DC therapy  OT Frequency: Min 2X/week   Barriers to D/C:            Co-evaluation              AM-PAC OT "6 Clicks" Daily Activity     Outcome Measure Help from another person eating meals?:  None Help from another person taking care of personal grooming?: None Help from another person toileting, which includes using toliet, bedpan, or urinal?: A Little Help from another person bathing (including washing, rinsing, drying)?: A Little Help from another person to put on and taking off regular upper body clothing?: A Lot Help from another person to put on and taking off regular lower body clothing?: A Little 6 Click Score: 19   End of Session Equipment Utilized During Treatment: Gait belt;Rolling walker  Activity Tolerance: Patient tolerated treatment well Patient left: in chair;with call bell/phone within reach;with bed alarm set;with family/visitor present;with SCD's reapplied  OT Visit Diagnosis: Other abnormalities of gait and mobility (R26.89);Muscle weakness (generalized) (M62.81)                Time: 1017-5102 OT Time Calculation (min): 29 min Charges:  OT General Charges $OT Visit: 1 Visit OT Evaluation $OT Eval Low Complexity: 1 Low OT Treatments $Self Care/Home Management : 23-37 mins  Jeni Salles, MPH, MS, OTR/L ascom [phone] 03/29/18, 3:21 PM

## 2018-03-29 NOTE — Clinical Social Work Note (Signed)
CSW received referral for SNF.  Case discussed with case manager and plan is to discharge home with home health.  CSW to sign off please re-consult if social work needs arise.  Faith [name] R. Jacqlyn Marolf, MSW, LCSW [phone]  

## 2018-03-29 NOTE — Progress Notes (Signed)
Physical Therapy Treatment Patient Details Name: Faith [name] MRN: [id number] DOB: Mar 25, 1945 Today's Date: 03/29/2018    History of Present Illness Pt is a 73 y.o. female s/p L TKA secondary OA 03/28/18.  PMH includes htn, CAD, h/o MI, cardiac stents, L breast CA, and B knee arthritis.      PT Comments    Pt requesting to toilet upon PT entering room and pt able to ambulate to bathroom with RW CGA.  Pt then ambulated 100 feet with RW CGA (initial cueing for gait technique).  Tolerated sitting ex's well.  Minimal L knee pain during session (0/10 end of session).  Will continue to progress pt with strengthening, ROM, and progressive ambulation per pt tolerance.    Follow Up Recommendations  Home health PT     Equipment Recommendations  Rolling walker with 5" wheels;3in1 (PT)    Recommendations for Other Services OT consult     Precautions / Restrictions Precautions Precautions: Fall;Knee Precaution Booklet Issued: Yes (comment) Precaution Comments: No BP L UE; wound vac Restrictions Weight Bearing Restrictions: Yes LLE Weight Bearing: Weight bearing as tolerated    Mobility  Bed Mobility Overal bed mobility: Needs Assistance Bed Mobility: Supine to Sit     Supine to sit: Min assist;HOB elevated     General bed mobility comments: Deferred d/t pt sitting up in chair beginning of session and sitting edge of bed end of session (with OT present)  Transfers Overall transfer level: Needs assistance Equipment used: Rolling walker (2 wheeled) Transfers: Sit to/from [facility] Sit to Stand: Min guard Stand pivot transfers: Min guard       General transfer comment: occasional vc's for UE and LE placement and overall technique; increased effort to stand on own; CGA for safety  Ambulation/Gait Ambulation/Gait assistance: Min guard Gait Distance (Feet): (25 feet (to bathroom); 100 feet) Assistive device: Rolling walker (2 wheeled) Gait Pattern/deviations:  Antalgic Gait velocity: decreased   General Gait Details: initial step to gait pattern progressing to partial step through gait pattern; decreased stance time L LE; initial cueing for L quad set and increasing B UE support through RW during L LE stance phase; occasional cueing for positioning within walker   Stairs             Wheelchair Mobility    Modified Rankin (Stroke Patients Only)       Balance Overall balance assessment: Needs assistance Sitting-balance support: No upper extremity supported;Feet supported Sitting balance-Leahy Scale: Good Sitting balance - Comments: steady sitting reaching within BOS   Standing balance support: No upper extremity supported Standing balance-Leahy Scale: Good Standing balance comment: steady standing washing hands at sink                            Cognition Arousal/Alertness: Awake/alert Behavior During Therapy: WFL for tasks assessed/performed Overall Cognitive Status: Within Functional Limits for tasks assessed                                        Exercises Total Joint Exercises Long Arc Quad: AROM;Right;AAROM;Left;Strengthening;10 reps;Seated Knee Flexion: AAROM;Strengthening;Left;10 reps;Seated Goniometric ROM (taken in AM session): L knee extension 10 degrees short of neutral semi-supine in bed; L knee flexion AAROM to 90 degrees sitting edge of recliner    General Comments General comments (skin integrity, edema, etc.): L knee dressing in place with  polar care over dressing and wound vac in place.  Pt agreeable to PT session and pt's husband arrived beginning of session.      Pertinent Vitals/Pain Pain Assessment: 0-10 Pain Score: 0-No pain Pain Location: L knee Pain Descriptors / Indicators: Sore Pain Intervention(s): Limited activity within patient's tolerance;Monitored during session;Premedicated before session;Repositioned;Other (comment)(OT to apply polar care after OT session)   Vitals stable and WFL throughout treatment session.    Home Living                      Prior Function            PT Goals (current goals can now be found in the care plan section) Acute Rehab PT Goals Patient Stated Goal: to have less pain PT Goal Formulation: With patient Time For Goal Achievement: 04/11/18 Potential to Achieve Goals: Good Progress towards PT goals: Progressing toward goals    Frequency    BID      PT Plan Current plan remains appropriate    Co-evaluation              AM-PAC PT "6 Clicks" Mobility   Outcome Measure  Help needed turning from your back to your side while in a flat bed without using bedrails?: A Little Help needed moving from lying on your back to sitting on the side of a flat bed without using bedrails?: A Little Help needed moving to and from a bed to a chair (including a wheelchair)?: A Little Help needed standing up from a chair using your arms (e.g., wheelchair or bedside chair)?: A Little Help needed to walk in hospital room?: A Little Help needed climbing 3-5 steps with a railing? : A Lot 6 Click Score: 17    End of Session Equipment Utilized During Treatment: Gait belt Activity Tolerance: Patient tolerated treatment well Patient left: with family/visitor present(sitting on edge of bed with OT present (OT to set pt up end of OT session)) Nurse Communication: Mobility status;Precautions;Weight bearing status PT Visit Diagnosis: Other abnormalities of gait and mobility (R26.89);Muscle weakness (generalized) (M62.81);Difficulty in walking, not elsewhere classified (R26.2);Pain Pain - Right/Left: Left Pain - part of body: Knee     Time: 1330-1408 PT Time Calculation (min) (ACUTE ONLY): 38 min  Charges:  $Gait Training: 8-22 mins $Therapeutic Exercise: 8-22 mins $Therapeutic Activity: 8-22 mins                    Leitha Bleak, PT 03/29/18, 2:23 PM [phone]

## 2018-03-30 LAB — BASIC METABOLIC PANEL
Anion gap: 8 (ref 5–15)
BUN: 21 mg/dL (ref 8–23)
CO2: 25 mmol/L (ref 22–32)
Calcium: 9.5 mg/dL (ref 8.9–10.3)
Chloride: 106 mmol/L (ref 98–111)
Creatinine, Ser: 0.94 mg/dL (ref 0.44–1.00)
GFR calc Af Amer: >60 mL/min (ref >60)
GFR calc non Af Amer: 60 mL/min — ABNORMAL LOW (ref >60)
Glucose, Bld: 153 mg/dL — ABNORMAL HIGH (ref 70–99)
Potassium: 4.6 mmol/L (ref 3.5–5.1)
Sodium: 139 mmol/L (ref 135–145)

## 2018-03-30 LAB — CBC
HCT: 31.4 % — ABNORMAL LOW (ref 36.0–46.0)
Hemoglobin: 10.3 g/dL — ABNORMAL LOW (ref 12.0–15.0)
MCH: 33.6 pg (ref 26.0–34.0)
MCHC: 32.8 g/dL (ref 30.0–36.0)
MCV: 102.3 fL — ABNORMAL HIGH (ref 80.0–100.0)
Platelets: 217 10*3/uL (ref 150–400)
RBC: 3.07 MIL/uL — ABNORMAL LOW (ref 3.87–5.11)
RDW: 13.2 % (ref 11.5–15.5)
WBC: 11.1 10*3/uL — ABNORMAL HIGH (ref 4.0–10.5)
nRBC: 0 % (ref 0.0–0.2)

## 2018-03-30 MED ORDER — ENOXAPARIN SODIUM 40 MG/0.4ML ~~LOC~~ SOLN: 40.0000 mg | SUBCUTANEOUS | 0 refills | Status: DC

## 2018-03-30 MED ORDER — HYDROCODONE-ACETAMINOPHEN 5-325 MG PO TABS: 1.0000 | ORAL_TABLET | ORAL | 0 refills | Status: DC | PRN

## 2018-03-30 MED ORDER — BISACODYL 10 MG RE SUPP
10.0000 mg | Freq: Once | RECTAL | Status: AC
  Administered 2018-03-30: 10 mg via RECTAL
  Filled 2018-03-30: qty 1

## 2018-03-30 NOTE — Progress Notes (Signed)
   Subjective: 2 Days Post-Op Procedure(s) (LRB): TOTAL KNEE ARTHROPLASTY-LEFT (Left) Patient reports pain as mild.   Patient is well, and has had no acute complaints or problems Denies any CP, SOB, ABD pain. We will continue therapy today.  Plan is to go Home after hospital stay.  Objective: Vital signs in last 24 hours: Temp:  [97.7 F (36.5 C)-98.8 F (37.1 C)] 98.8 F (37.1 C) (03/12 0825) Pulse Rate:  [64-72] 72 (03/12 0825) Resp:  [16-18] 16 (03/12 0825) BP: (124-137)/(43-48) 137/48 (03/12 0825) SpO2:  [94 %-96 %] 94 % (03/12 0825)  Intake/Output from previous day: 03/11 0701 - 03/12 0700 In: 719.4 [P.O.:240; I.V.:479.4] Out: 0  Intake/Output this shift: Total I/O In: 240 [P.O.:240] Out: -   Recent Labs    03/28/18 1254 03/29/18 0436 03/30/18 0423  HGB 10.6* 9.7* 10.3*   Recent Labs    03/29/18 0436 03/30/18 0423  WBC 7.1 11.1*  RBC 2.88* 3.07*  HCT 29.1* 31.4*  PLT 181 217   Recent Labs    03/29/18 0436 03/30/18 0423  NA 136 139  K 4.3 4.6  CL 106 106  CO2 23 25  BUN 24* 21  CREATININE 1.00 0.94  GLUCOSE 168* 153*  CALCIUM 8.9 9.5   No results for input(s): LABPT, INR in the last 72 hours.  EXAM General - Patient is Alert, Appropriate and Oriented Extremity - Neurovascular intact Sensation intact distally Intact pulses distally Dorsiflexion/Plantar flexion intact No cellulitis present Compartment soft Dressing - dressing C/D/I and no drainage Praveena intact without drainage Motor Function - intact, moving foot and toes well on exam.   Past Medical History:  Diagnosis Date  . Arthritis    bilateral knees  . Cancer [facility]) April 2015   left breast  . Coronary artery disease   . Hypertension   . Myocardial infarction ([place]) 11/03/2015   [facility], stent placed  . Pneumonia     Assessment/Plan:   2 Days Post-Op Procedure(s) (LRB): TOTAL KNEE ARTHROPLASTY-LEFT (Left) Active Problems:   Status post total knee replacement using cement,  left  Estimated body mass index is 39.11 kg/m as calculated from the following:   Height as of this encounter: 5' 6.5" (1.689 m).   Weight as of this encounter: 111.6 kg. Advance diet Up with therapy  Acute post op blood loss anemia -hemoglobin 10.3, trending up Needs bowel movement Care management to assist with discharge  DVT Prophylaxis - Lovenox, Foot Pumps and TED hose Weight-Bearing as tolerated to left leg   T. Rachelle Hora, PA-C [place] 03/30/2018, 11:15 AM

## 2018-03-30 NOTE — Care Management (Signed)
[facility] Physical therapy wants an order for PT to be faxed to [phone], notified Rachelle Hora that we need the order. Once obtained fax to [facility]

## 2018-03-30 NOTE — Discharge Summary (Signed)
Physician Discharge Summary  Patient ID: Faith [name] MRN: [id number] DOB/AGE: 75-Aug-1947 75 y.o.  Admit date: 03/28/2018 Discharge date: 03/30/2018  Admission Diagnoses:  PRIMARY LOCALIZED OSTEOARTHRITIS OF LEFT KNEE   Discharge Diagnoses: Patient Active Problem List   Diagnosis Date Noted  . Status post total knee replacement using cement, left 03/28/2018  . 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 female, estrogen receptor positive ([place]) 09/09/2015    Past Medical History:  Diagnosis Date  . Arthritis    bilateral knees  . Cancer [facility]) April 2015   left breast  . Coronary artery disease   . Hypertension   . Myocardial infarction ([place]) 11/03/2015   [facility], stent placed  . Pneumonia      Transfusion: none   Consultants (if any):   Discharged Condition: Improved  Hospital Course: Faith [name] is an 73 y.o. female who was admitted 03/28/2018 with a diagnosis of knee osteoarthritis and went to the operating room on 03/28/2018 and underwent the above named procedures.    Surgeries: Procedure(s): TOTAL KNEE ARTHROPLASTY-LEFT on 03/28/2018 Patient tolerated the surgery well. Taken to PACU where she was stabilized and then transferred to the orthopedic floor.  Started on Lovenox 30 mg q 12 hrs. Foot pumps applied bilaterally at 80 mm. Heels elevated on bed with rolled towels. No evidence of DVT. Negative [place]oman. Physical therapy started on day #1 for gait training and transfer. OT started day #1 for ADL and assisted devices.  Patient's foley was d/c on day #1. Patient's IV was d/c on day #2.  On post op day #2 patient was stable and ready for discharge to home.  Implants: Medacta GM K sphere system, left 4 femur, 3 TI 4 tibial component, 12 mm insert with size 2 patella, short stem for tibia, all components cemented  She was given perioperative antibiotics:  Anti-infectives (From admission, onward)   Start     Dose/Rate Route Frequency Ordered Stop   03/28/18 1500   ceFAZolin (ANCEF) IVPB 2g/100 mL premix     2 g 200 mL/hr over 30 Minutes Intravenous Every 6 hours 03/28/18 1228 03/29/18 0509   03/28/18 0923  gentamicin (GARAMYCIN) 80 mg in sodium chloride 0.9 % 500 mL irrigation  Status:  Discontinued       As needed 03/28/18 0932 03/28/18 1037   03/28/18 0652  ceFAZolin (ANCEF) 2-4 GM/100ML-% IVPB    Note to Pharmacy:  Faith [name]   : cabinet override      03/28/18 0652 03/28/18 0905   03/27/18 2315  ceFAZolin (ANCEF) IVPB 2g/100 mL premix     2 g 200 mL/hr over 30 Minutes Intravenous  Once 03/27/18 2302 03/28/18 0935    .  She was given sequential compression devices, early ambulation, and lovenox for DVT prophylaxis.  She benefited maximally from the hospital stay and there were no complications.    Recent vital signs:  Vitals:   03/29/18 2332 03/30/18 0825  BP: (!) 135/47 (!) 137/48  Pulse: 69 72  Resp: 18 16  Temp: 98.6 F (37 C) 98.8 F (37.1 C)  SpO2: 94% 94%    Recent laboratory studies:  Lab Results  Component Value Date   HGB 10.3 (L) 03/30/2018   HGB 9.7 (L) 03/29/2018   HGB 10.6 (L) 03/28/2018   Lab Results  Component Value Date   WBC 11.1 (H) 03/30/2018   PLT 217 03/30/2018   Lab Results  Component Value Date   INR 1.0 03/15/2018   Lab  Results  Component Value Date   NA 139 03/30/2018   K 4.6 03/30/2018   CL 106 03/30/2018   CO2 25 03/30/2018   BUN 21 03/30/2018   CREATININE 0.94 03/30/2018   GLUCOSE 153 (H) 03/30/2018    Discharge Medications:   Allergies as of 03/30/2018      Reactions   Mushroom Extract Complex Hives, Swelling   Shellfish Allergy Nausea And Vomiting      Medication List    STOP taking these medications   acetaminophen 500 MG tablet Commonly known as:  TYLENOL     TAKE these medications   atorvastatin 80 MG tablet Commonly known as:  LIPITOR Take 80 mg by mouth daily at 6 PM.   CALCIUM + D PO Take 1 tablet by mouth daily.   enoxaparin 40 MG/0.4ML injection Commonly  known as:  Lovenox Inject 0.4 mLs (40 mg total) into the skin daily for 14 days.   HYDROcodone-acetaminophen 5-325 MG tablet Commonly known as:  NORCO/VICODIN Take 1-2 tablets by mouth every 4 (four) hours as needed for moderate pain (pain score 4-6).   Ibrance 125 MG capsule Generic drug:  palbociclib TAKE 1 CAPSULE (125 MG TOTAL) BY MOUTH DAILY WITH BREAKFAST. TAKE FOR 3 WEEKS, THEN 1 WEEK OFF.   letrozole 2.5 MG tablet Commonly known as:  FEMARA Take 1 tablet (2.5 mg total) by mouth daily.   losartan 25 MG tablet Commonly known as:  COZAAR Take 25 mg by mouth daily.   metoprolol tartrate 25 MG tablet Commonly known as:  LOPRESSOR Take 25 mg by mouth 2 (two) times daily.   nitroGLYCERIN 0.4 MG SL tablet Commonly known as:  NITROSTAT Place under the tongue.   ondansetron 8 MG disintegrating tablet Commonly known as:  ZOFRAN-ODT Take by mouth every 8 (eight) hours as needed.   VITAMIN D PO Take 5,000 Units by mouth daily.            Durable Medical Equipment  (From admission, onward)         Start     Ordered   03/28/18 1229  DME Walker rolling  Once    Question:  Patient needs a walker to treat with the following condition  Answer:  Status post total knee replacement using cement, left   03/28/18 1228   03/28/18 1229  DME 3 n 1  Once     03/28/18 1228   03/28/18 1229  DME Bedside commode  Once    Question:  Patient needs a bedside commode to treat with the following condition  Answer:  Status post total knee replacement using cement, left   03/28/18 1228          Diagnostic Studies: Dg Knee 1-2 Views Left  Result Date: 03/28/2018 CLINICAL DATA:  Status post total knee arthroplasty EXAM: LEFT KNEE - 1-2 VIEW COMPARISON:  CT left knee February 24, 2018 FINDINGS: Frontal and lateral views were obtained. The patient has had total knee replacement with femoral and tibial prosthetic components well-seated. No fracture or dislocation. Air within the knee joint is  an expected postoperative finding. IMPRESSION: Prosthetic components well-seated. No fracture or dislocation. Intra-articular air present, an expected postoperative finding. Electronically Signed   By: Lowella Grip III M.D.   On: 03/28/2018 11:09    Disposition:     Follow-up Information    Duanne Guess, PA-C Follow up in 2 week(s).   Specialties:  Orthopedic Surgery, Emergency Medicine Contact information: [place] [phone]            Signed: Dorise Hiss CHRISTOPHER 03/30/2018, 11:34 AM

## 2018-03-30 NOTE — Discharge Instructions (Signed)

## 2018-03-30 NOTE — Care Management (Signed)
[place] to check price of lovenox that was faxed over they have not run the RX yet and therefore can't give the price, he put me on hold and will get the price in a moment, price came in at $4.91, notified patient

## 2018-03-30 NOTE — Care Management (Signed)
Called Dr. Rudene Christians and spoke with Jenny Reichmann she will get an order for outpatient PT from Dr. Rudene Christians and fax to [facility] outpatient PT at [phone]

## 2018-03-30 NOTE — Progress Notes (Signed)
Patient is alert and oriented and able to verblaize needs. No complaints of pain at this time. VSS. PIV removed. Printed AVS and scripts for lovenox and norco given in discharge packet. All instructions gone over with patient. No concerns voiced. All belongings packed. NT escorted pt to car via wc. Husband to transport pt home.   Bethann Punches, RN

## 2018-03-30 NOTE — Care Management (Signed)
Called [facility] and they scheduled her at 4 PM at [facility] awaiting an order they are faxing to [place] to be filled out and faxed to [phone]

## 2018-03-30 NOTE — Care Management (Signed)
Called [facility] pharmacy at [phone] to get the price of lovenox Wanted the order faxed to [phone] before can give price, faxed order

## 2018-03-30 NOTE — Progress Notes (Signed)
Physical Therapy Treatment Patient Details Name: Faith [name] MRN: [id number] DOB: 1945/10/13 Today's Date: 03/30/2018    History of Present Illness Pt is a 73 y.o. female s/p L TKA secondary OA 03/28/18.  PMH includes htn, CAD, h/o MI, cardiac stents, L breast CA, and B knee arthritis.      PT Comments    Pt reporting 0/10 pain beginning of session and 2/10 L knee pain end of session; pt also reporting having to get up to go to bathroom a lot last night so did not get a lot of good sleep and felt tired today but still was very motivated to participate in session (pt given rest breaks and paced per pt tolerance).  Pt able to ambulate up to 180 feet with RW CGA progressing to SBA and also navigate stairs with CGA (see below for details).  L knee flexion AAROM to 90 degrees.  L knee extension still short of neutral by 10 degrees (pt educated on positioning to improve L knee extension ROM).  Pt appears safe to discharge home when medically appropriate.  PT recommending HHPT but pt reporting her husband does not allow anyone into the home and is requesting OP PT instead (CM notified).  Pt reports no questions or concerns for discharge home.    Follow Up Recommendations  Home health PT     Equipment Recommendations  Rolling walker with 5" wheels;3in1 (PT)    Recommendations for Other Services OT consult     Precautions / Restrictions Precautions Precautions: Fall;Knee Precaution Booklet Issued: Yes (comment) Precaution Comments: No BP L UE; wound vac Restrictions Weight Bearing Restrictions: Yes LLE Weight Bearing: Weight bearing as tolerated    Mobility  Bed Mobility Overal bed mobility: Needs Assistance Bed Mobility: Supine to Sit;Sit to Supine     Supine to sit: Supervision Sit to supine: Min assist   General bed mobility comments: increased effort/time to perform supine to sit on own; assist for L LE sit to supine (pt reports her husband is able to assist her with L  LE)  Transfers Overall transfer level: Needs assistance Equipment used: Rolling walker (2 wheeled) Transfers: Sit to/from Stand Sit to Stand: Supervision Stand pivot transfers: Supervision       General transfer comment: no vc's required; steady transfers noted; increase effort to stand from bed but no assist required  Ambulation/Gait Ambulation/Gait assistance: Min guard;Supervision Gait Distance (Feet): (100 feet; 180 feet) Assistive device: Rolling walker (2 wheeled)   Gait velocity: decreased   General Gait Details: initial step to gait pattern progressing to partial step through gait pattern; decreased stance time L LE; steady   Chief Strategy Officer    Modified Rankin (Stroke Patients Only)       Balance Overall balance assessment: Needs assistance Sitting-balance support: No upper extremity supported;Feet supported Sitting balance-Leahy Scale: Normal Sitting balance - Comments: steady sitting reaching outside BOS   Standing balance support: No upper extremity supported Standing balance-Leahy Scale: Good Standing balance comment: steady standing reaching within BOS                            Cognition Arousal/Alertness: Awake/alert Behavior During Therapy: WFL for tasks assessed/performed Overall Cognitive Status: Within Functional Limits for tasks assessed  Exercises Total Joint Exercises Long Arc Quad: AAROM;AROM;Strengthening;Left;10 reps;Seated Knee Flexion: AAROM;AROM;Strengthening;Left;10 reps;Seated Goniometric ROM: L knee extension 10 degrees short of neutral semi-supine in bed; L knee flexion AAROM to 90 degrees sitting edge of mat table    General Comments General comments (skin integrity, edema, etc.): wound vac in place; assisted nurse with donning L TED hose beginning of session.  Nursing cleared pt for participation in physical therapy.  Pt agreeable to  PT session.      Pertinent Vitals/Pain Pain Assessment: 0-10 Pain Score: 2  Pain Location: L knee Pain Descriptors / Indicators: Sore Pain Intervention(s): Limited activity within patient's tolerance;Monitored during session;Premedicated before session;Repositioned;Other (comment)(polar care applied and activated)  Vitals (HR and O2 on room air) stable and WFL throughout treatment session.     Home Living                      Prior Function            PT Goals (current goals can now be found in the care plan section) Acute Rehab PT Goals Patient Stated Goal: to go home and recover PT Goal Formulation: With patient Time For Goal Achievement: 04/11/18 Potential to Achieve Goals: Good Progress towards PT goals: Progressing toward goals    Frequency    BID      PT Plan Current plan remains appropriate    Co-evaluation              AM-PAC PT "6 Clicks" Mobility   Outcome Measure  Help needed turning from your back to your side while in a flat bed without using bedrails?: A Little Help needed moving from lying on your back to sitting on the side of a flat bed without using bedrails?: A Little Help needed moving to and from a bed to a chair (including a wheelchair)?: A Little Help needed standing up from a chair using your arms (e.g., wheelchair or bedside chair)?: A Little Help needed to walk in hospital room?: A Little Help needed climbing 3-5 steps with a railing? : A Little 6 Click Score: 18    End of Session Equipment Utilized During Treatment: Gait belt Activity Tolerance: Patient tolerated treatment well Patient left: in chair;with call bell/phone within reach;with chair alarm set;with SCD's reapplied;Other (comment)(L heel elevated via towel roll; polar care in place and activated) Nurse Communication: Mobility status;Precautions;Weight bearing status PT Visit Diagnosis: Other abnormalities of gait and mobility (R26.89);Muscle weakness  (generalized) (M62.81);Difficulty in walking, not elsewhere classified (R26.2);Pain Pain - Right/Left: Left Pain - part of body: Knee     Time: 0922-1030 PT Time Calculation (min) (ACUTE ONLY): 68 min  Charges:  $Gait Training: 23-37 mins $Therapeutic Exercise: 8-22 mins $Therapeutic Activity: 23-37 mins                    Leitha Bleak, PT 03/30/18, 10:46 AM [phone]

## 2018-04-03 ENCOUNTER — Other Ambulatory Visit: Payer: Self-pay | Admitting: Pharmacist

## 2018-04-03 DIAGNOSIS — Z170 Estrogen receptor positive status [ER+]: Secondary | ICD-10-CM

## 2018-04-03 DIAGNOSIS — C50812 Malignant neoplasm of overlapping sites of left female breast: Secondary | ICD-10-CM

## 2018-04-03 MED ORDER — PALBOCICLIB 125 MG PO CAPS: 125.0000 mg | ORAL_CAPSULE | Freq: Every day | ORAL | 4 refills | Status: DC

## 2018-04-06 MED FILL — IBRANCE 125 MG CAPSULE: 125 | 28 days supply | Qty: 21 | Fill #0

## 2018-05-18 ENCOUNTER — Other Ambulatory Visit: Payer: Self-pay | Admitting: Orthopedic Surgery

## 2018-05-18 DIAGNOSIS — G8929 Other chronic pain: Secondary | ICD-10-CM

## 2018-05-18 DIAGNOSIS — M25561 Pain in right knee: Principal | ICD-10-CM

## 2018-05-24 ENCOUNTER — Ambulatory Visit
Admission: RE | Admit: 2018-05-24 | Discharge: 2018-05-24 | Disposition: A | Discharge status: Home / Self Care | Payer: Medicare Other | Source: Ambulatory Visit | Attending: Orthopedic Surgery | Admitting: Orthopedic Surgery

## 2018-05-24 ENCOUNTER — Other Ambulatory Visit: Payer: Self-pay

## 2018-05-24 DIAGNOSIS — M25561 Pain in right knee: Secondary | ICD-10-CM | POA: Diagnosis present

## 2018-05-24 DIAGNOSIS — G8929 Other chronic pain: Secondary | ICD-10-CM | POA: Insufficient documentation

## 2018-05-31 ENCOUNTER — Telehealth: Payer: Self-pay | Admitting: Pharmacy Technician

## 2018-05-31 NOTE — Telephone Encounter (Signed)
Oral Oncology Patient Advocate Encounter   Received an email from [name] (PAF) that grant funding for Faith [name] has been closed due to no inactivity in the past 120 days.  Her other grant from [facility] expired in April.  Faith [name] has been on hold due to patient having total knee replacement surgery.    If patient restarts Ibrance, we will need to see if grant assistance is open for her diagnosis (not PAF since it closed before original grant expiration date).  If no grant assistance available we will apply for manufacturer assistance.  [place] [name] Phone [phone] Fax [phone] 05/31/2018 10:39 AM

## 2018-06-30 DIAGNOSIS — Z7189 Other specified counseling: Secondary | ICD-10-CM | POA: Insufficient documentation

## 2018-07-05 ENCOUNTER — Telehealth: Payer: Self-pay | Admitting: Hematology and Oncology

## 2018-07-05 NOTE — Telephone Encounter (Signed)
Re:  Surgery plans  Today I spoke with Dr Rudene Christians regarding her knee surgeries.  She underwent left knee replacement on 03/28/2018.  She is doing well.  She is being considered for right knee replacement on 08/01/2018.  She can be on Femara.  [name] is currently on hold.  Information will be shared with the patient.   Lequita Asal, MD

## 2018-07-20 ENCOUNTER — Encounter
Admission: RE | Admit: 2018-07-20 | Discharge: 2018-07-20 | Disposition: A | Discharge status: Home / Self Care | Payer: Medicare Other | Source: Ambulatory Visit | Attending: Orthopedic Surgery | Admitting: Orthopedic Surgery

## 2018-07-20 ENCOUNTER — Telehealth: Payer: Self-pay | Admitting: Hematology and Oncology

## 2018-07-20 ENCOUNTER — Other Ambulatory Visit: Payer: Self-pay

## 2018-07-20 DIAGNOSIS — Z01812 Encounter for preprocedural laboratory examination: Secondary | ICD-10-CM | POA: Diagnosis not present

## 2018-07-20 HISTORY — DX: Hyperlipidemia, unspecified: E78.5

## 2018-07-20 LAB — URINALYSIS, ROUTINE W REFLEX MICROSCOPIC
Bilirubin Urine: NEGATIVE
Glucose, UA: NEGATIVE mg/dL
Hgb urine dipstick: NEGATIVE
Ketones, ur: NEGATIVE mg/dL
Leukocytes,Ua: NEGATIVE
Nitrite: NEGATIVE
Protein, ur: NEGATIVE mg/dL
Specific Gravity, Urine: 1.025 (ref 1.005–1.030)
pH: 5 (ref 5.0–8.0)

## 2018-07-20 LAB — BASIC METABOLIC PANEL
Anion gap: 8 (ref 5–15)
BUN: 22 mg/dL (ref 8–23)
CO2: 28 mmol/L (ref 22–32)
Calcium: 10.2 mg/dL (ref 8.9–10.3)
Chloride: 106 mmol/L (ref 98–111)
Creatinine, Ser: 0.93 mg/dL (ref 0.44–1.00)
GFR calc Af Amer: >60 mL/min (ref >60)
GFR calc non Af Amer: >60 mL/min (ref >60)
Glucose, Bld: 112 mg/dL — ABNORMAL HIGH (ref 70–99)
Potassium: 4.2 mmol/L (ref 3.5–5.1)
Sodium: 142 mmol/L (ref 135–145)

## 2018-07-20 LAB — SEDIMENTATION RATE: Sed Rate: 58 mm/hr — ABNORMAL HIGH (ref 0–30)

## 2018-07-20 LAB — CBC
HCT: 38.7 % (ref 36.0–46.0)
Hemoglobin: 12.6 g/dL (ref 12.0–15.0)
MCH: 29 pg (ref 26.0–34.0)
MCHC: 32.6 g/dL (ref 30.0–36.0)
MCV: 89 fL (ref 80.0–100.0)
Platelets: 218 10*3/uL (ref 150–400)
RBC: 4.35 MIL/uL (ref 3.87–5.11)
RDW: 13.4 % (ref 11.5–15.5)
WBC: 8.8 10*3/uL (ref 4.0–10.5)
nRBC: 0 % (ref 0.0–0.2)

## 2018-07-20 LAB — TYPE AND SCREEN
ABO/RH(D): A POS
Antibody Screen: NEGATIVE

## 2018-07-20 LAB — PROTIME-INR
INR: 1 (ref 0.8–1.2)
Prothrombin Time: 12.6 seconds (ref 11.4–15.2)

## 2018-07-20 LAB — SURGICAL PCR SCREEN
MRSA, PCR: NEGATIVE
Staphylococcus aureus: NEGATIVE

## 2018-07-20 LAB — APTT: aPTT: 43 seconds — ABNORMAL HIGH (ref 24–36)

## 2018-07-20 NOTE — Pre-Procedure Instructions (Signed)
FAXED SED RATE AND PT/APTT TO DR [facility]

## 2018-07-20 NOTE — Patient Instructions (Signed)
Your procedure is scheduled on: Tues 7/14 Report to Day Surgery. To find out your arrival time please call [phone] between 1PM - 3PM on Mon 7/13.  Remember: Instructions that are not followed completely may result in serious medical risk,  up to and including death, or upon the discretion of your surgeon and anesthesiologist your  surgery may need to be rescheduled.     _X__ 1. Do not eat food after midnight the night before your procedure.                 No gum chewing or hard candies. You may drink clear liquids up to 2 hours                 before you are scheduled to arrive for your surgery- DO not drink clear                 liquids within 2 hours of the start of your surgery.                 Clear Liquids include:  water, apple juice without pulp, clear carbohydrate                 drink such as Clearfast of Gatorade, Black Coffee or Tea (Do not add                 anything to coffee or tea).  __X__2.  On the morning of surgery brush your teeth with toothpaste and water, you                may rinse your mouth with mouthwash if you wish.  Do not swallow any toothpaste of mouthwash.     _X__ 3.  No Alcohol for 24 hours before or after surgery.   ___ 4.  Do Not Smoke or use e-cigarettes For 24 Hours Prior to Your Surgery.                 Do not use any chewable tobacco products for at least 6 hours prior to                 surgery.  ____  5.  Bring all medications with you on the day of surgery if instructed.   __x__  6.  Notify your doctor if there is any change in your medical condition      (cold, fever, infections).     Do not wear jewelry, make-up, hairpins, clips or nail polish. Do not wear lotions, powders, or perfumes. You may wear deodorant. Do not shave 48 hours prior to surgery. Men may shave face and neck. Do not bring valuables to the hospital.    [facility] is not responsible for any belongings or valuables.  Contacts, dentures or  bridgework may not be worn into surgery. Leave your suitcase in the car. After surgery it may be brought to your room. For patients admitted to the hospital, discharge time is determined by your treatment team.   Patients discharged the day of surgery will not be allowed to drive home.   Please read over the following fact sheets that you were given:     __x__ Take these medicines the morning of surgery with A SIP OF WATER:    1. metoprolol tartrate (LOPRESSOR) 25 MG tablet  2.   3.   4.  5.  6.  ____ Fleet Enema (as directed)   __x__ Use CHG Soap as directed  ____  Use inhalers on the day of surgery  ____ Stop metformin 2 days prior to surgery    ____ Take 1/2 of usual insulin dose the night before surgery. No insulin the morning          of surgery.   __x__ Stop aspirin on 7/6 last dose  __x__ Stop Anti-inflammatories  Ibuprofen or aleve on 7/6   __x__ Stop supplements until after surgery.    aLst dose on 7/6  ____ Bring C-Pap to the hospital.

## 2018-07-21 LAB — URINE CULTURE

## 2018-07-23 NOTE — Progress Notes (Signed)
[facility]  [address] [name], [place] [address] Phone: [phone]  Fax: [phone]   Telemedicine Office Visit:  07/24/2018  Referring physician: No ref. provider found  I connected with Faith [name] on 07/24/2018 at 2:54 PM by videoconferencing and verified that I was speaking with the correct person using 2 identifiers.  The patient was at [name].  I discussed the limitations, risk, security and privacy concerns of performing an evaluation and management service by videoconferencing and the availability of in person appointments.  I also discussed with the patient that there may be a patient responsible charge related to this service.  The patient expressed understanding and agreed to proceed.   Chief Complaint: Faith [name] is a 73 y.o. female with metastatic breast cancer on Femara who is seen for 5 month assessment.   HPI: The patient was last seen in the oncology clinic on 03/07/2018. At that time, she noted knee pain.  She had hot flashes.  Exam revealed no chest wall abnormalities or adenopathy. CA27.29 was 13.1. Faith [name] was placed on hold secondary to upcoming knee surgeries.  She underwent left knee replacement on 03/28/2018 with Dr. Hessie Knows. She is scheduled to undergo right knee replacement on 08/01/2018. Faith [name] remains on hold.   During the interim, the patient is doing well. She has not been on letrozole or Ibrance, and would like to have a scan and labs before she starts back on it. She thinks she will be able to undergo a scan in mid-August.    Past Medical History:  Diagnosis Date  . Arthritis    bilateral knees  . Cancer [facility]) April 2015   left breast  . Coronary artery disease   . Hyperlipidemia   . Hypertension   . Myocardial infarction ([place]) 11/03/2015   [facility], stent placed  . Pneumonia     Past Surgical History:  Procedure Laterality Date  . BREAST SURGERY Bilateral 11/19/13   bilater mastectomy   . CARDIAC  CATHETERIZATION    . CORONARY ANGIOPLASTY    . PORT-A-CATH REMOVAL  2016  . PORTACATH PLACEMENT  06-07-13  . SHOULDER OPEN ROTATOR CUFF REPAIR  2012  . TOTAL KNEE ARTHROPLASTY Left 03/28/2018   Procedure: TOTAL KNEE ARTHROPLASTY-LEFT;  Surgeon: Hessie Knows, MD;  Location: [facility] ORS;  Service: Orthopedics;  Laterality: Left;  . WRIST SURGERY Left 1969   cyst    Family History  Adopted: Yes    Social History:  reports that she quit smoking about 33 years ago. Her smoking use included cigarettes. She has a 15.00 pack-year smoking history. She has never used smokeless tobacco. She reports current alcohol use. She reports that she does not use drugs. She is retired; she previously drove a truck.  She is from [place].  The patient is alone today.  Participants in the patient's visit and their role in the encounter included the patient and Waymon Budge, RN today.  The intake visit was provided by Waymon Budge, RN.  Allergies:  Allergies  Allergen Reactions  . Mushroom Extract Complex Hives and Swelling  . Nickel Rash  . Shellfish Allergy Nausea And Vomiting    Current Medications: Current Outpatient Medications  Medication Sig Dispense Refill  . Ascorbic Acid (VITAMIN C) 1000 MG tablet Take 1,000 mg by mouth daily.    Marland Kitchen aspirin EC 81 MG tablet Take 81 mg by mouth daily.    Marland Kitchen atorvastatin (LIPITOR) 80 MG tablet Take 80 mg by mouth daily at  6 PM.     . Biotin 5 MG CAPS Take 5 mg by mouth 2 (two) times a day.    . Calcium Carb-Cholecalciferol (CALCIUM 600+D3 PO) Take 1 tablet by mouth 2 (two) times a day.    . Carboxymethylcellul-Glycerin (REFRESH OPTIVE) 1-0.9 % GEL Place 1 drop into both eyes at bedtime.    . cetirizine (ZYRTEC) 10 MG tablet Take 10 mg by mouth daily.    . Cholecalciferol (VITAMIN D3) 50 MCG (2000 UT) TABS Take 2,000 Units by mouth 2 (two) times a day.    . Coenzyme Q10 (COQ10) 100 MG CAPS Take 100 mg by mouth daily.    Marland Kitchen losartan (COZAAR) 50 MG tablet Take  50 mg by mouth daily.     . metoprolol tartrate (LOPRESSOR) 25 MG tablet Take 25 mg by mouth 2 (two) times daily.     . nitroGLYCERIN (NITROSTAT) 0.4 MG SL tablet Place 0.4 mg under the tongue every 5 (five) minutes as needed for chest pain.      No current facility-administered medications for this visit.    Facility-Administered Medications Ordered in Other Visits  Medication Dose Route Frequency Provider Last Rate Last Dose  . sodium chloride 0.9 % injection 10 mL  10 mL Intravenous PRN Choksi, Delorise Shiner, MD   10 mL at 09/12/14 1014    Review of Systems  Constitutional: Negative for chills, diaphoresis, fever, malaise/fatigue and weight loss.       Doing well.  HENT: Negative.  Negative for congestion, hearing loss, sinus pain and sore throat.   Eyes: Negative.  Negative for blurred vision.  Respiratory: Negative.  Negative for cough, shortness of breath and wheezing.   Cardiovascular: Negative.  Negative for chest pain, palpitations, orthopnea, leg swelling and PND.       S/p MI 10/2015.   Gastrointestinal: Negative for abdominal pain, blood in stool, constipation, diarrhea, melena, nausea and vomiting.  Genitourinary: Negative.  Negative for dysuria, frequency, hematuria and urgency.  Musculoskeletal: Positive for joint pain (right knee pain). Negative for back pain and myalgias.       S/p left knee replacement.  Skin: Negative.  Negative for rash.  Neurological: Negative for dizziness, tingling, sensory change, weakness and headaches.  Endo/Heme/Allergies: Negative.  Does not bruise/bleed easily.  Psychiatric/Behavioral: Negative for depression, memory loss and substance abuse. The patient is not nervous/anxious and does not have insomnia.   All other systems reviewed and are negative.   Performance status (ECOG): 1-2  Physical Exam  Constitutional: She is oriented to person, place, and time. She appears well-developed and well-nourished. No distress.  Shoulder length graying hair.   Eyes: Conjunctivae and EOM are normal.  Glasses.  Hazel eyes.  Neurological: She is alert and oriented to person, place, and time.  Psychiatric: She has a normal mood and affect. Her behavior is normal. Judgment and thought content normal.  Nursing note reviewed.    Imaging studies: 05/09/2013:  Chest CT revealed a 2.8 cm soft tissue nodule in the left axillary tail.  There was bulky axillary adenopathy (largest 1.8 cm).  There were prominent mediastinal lymph nodes (7.3 cm AP window; 7 mm and 8 mm anterior to the central right mainstem bronchus, 11 mm central right hilar lymph node).  There was a small left pleural effusion.   05/29/2013:  Bone scan revealed no evidence of metastatic disease.   05/31/2013:  Abdomen and pelvis CT revealed no evidence of abdominal pelvic metastatic disease.   There was sclerosis of the  upper sacrum is without abnormal activity on recent bone scan, likely incidental. 06/02/2015:  PET scan revealed no hypermetabolic recurrent or residual disease.  There was suspicion of a right middle lobe pulmonary nodule, suboptimally evaluated.   11/19/2015:  Chest, abdomen, and pelvis CT revealed no evidence of metastatic disease.  The 4 mm right middle lobe nodule was not identified.  05/21/2016:  Chest, abdomen and pelvis CT revealed no evidence of metastatic disease. 02/21/2017:  Chest, abdomen and pelvis CT revealed no evidence of metastatic disease. 12/01/2017:  Chest, abdomen and pelvis CT revealed no evidence of metastatic disease.   No visits with results within 3 Day(s) from this visit.  Latest known visit with results is:  Hospital Outpatient Visit on 07/20/2018  Component Date Value Ref Range Status  . WBC 07/20/2018 8.8  4.0 - 10.5 K/uL Final  . RBC 07/20/2018 4.35  3.87 - 5.11 MIL/uL Final  . Hemoglobin 07/20/2018 12.6  12.0 - 15.0 g/dL Final  . HCT 07/20/2018 38.7  36.0 - 46.0 % Final  . MCV 07/20/2018 89.0  80.0 - 100.0 fL Final  . MCH 07/20/2018 29.0   26.0 - 34.0 pg Final  . MCHC 07/20/2018 32.6  30.0 - 36.0 g/dL Final  . RDW 07/20/2018 13.4  11.5 - 15.5 % Final  . Platelets 07/20/2018 218  150 - 400 K/uL Final  . nRBC 07/20/2018 0.0  0.0 - 0.2 % Final   Performed at [facility], [address] [place], [place] [address]  . Sodium 07/20/2018 142  135 - 145 mmol/L Final  . Potassium 07/20/2018 4.2  3.5 - 5.1 mmol/L Final  . Chloride 07/20/2018 106  98 - 111 mmol/L Final  . CO2 07/20/2018 28  22 - 32 mmol/L Final  . Glucose, Bld 07/20/2018 112* 70 - 99 mg/dL Final  . BUN 07/20/2018 22  8 - 23 mg/dL Final  . Creatinine, Ser 07/20/2018 0.93  0.44 - 1.00 mg/dL Final  . Calcium 07/20/2018 10.2  8.9 - 10.3 mg/dL Final  . GFR calc non Af Amer 07/20/2018 >60  >60 mL/min Final  . GFR calc Af Amer 07/20/2018 >60  >60 mL/min Final  . Anion gap 07/20/2018 8  5 - 15 Final   Performed at [facility], [address] [place] [address], [place], [place] [address]  . Prothrombin Time 07/20/2018 12.6  11.4 - 15.2 seconds Final  . INR 07/20/2018 1.0  0.8 - 1.2 Final   Comment: (NOTE) INR goal varies based on device and disease states. Performed at [facility], [address] [place], [place] [address]   . aPTT 07/20/2018 43* 24 - 36 seconds Final   Comment:        IF BASELINE aPTT IS ELEVATED, SUGGEST PATIENT RISK ASSESSMENT BE USED TO DETERMINE APPROPRIATE ANTICOAGULANT THERAPY. Performed at [facility], [address], [place], [place] [address]   . Color, Urine 07/20/2018 YELLOW* YELLOW Final  . APPearance 07/20/2018 HAZY* CLEAR Final  . Specific Gravity, Urine 07/20/2018 1.025  1.005 - 1.030 Final  . pH 07/20/2018 5.0  5.0 - 8.0 Final  . Glucose, UA 07/20/2018 NEGATIVE  NEGATIVE mg/dL Final  . Hgb urine dipstick 07/20/2018 NEGATIVE  NEGATIVE Final  . Bilirubin Urine 07/20/2018 NEGATIVE  NEGATIVE Final  . Ketones, ur 07/20/2018 NEGATIVE  NEGATIVE mg/dL Final  . Protein, ur 07/20/2018 NEGATIVE  NEGATIVE mg/dL  Final  . Nitrite 07/20/2018 NEGATIVE  NEGATIVE Final  . Chalmers Guest 07/20/2018 NEGATIVE  NEGATIVE Final  Performed at [facility], [address], [place], [place] [address]  . Specimen Description 07/20/2018    Final                   Value:URINE, RANDOM Performed at [facility], [address] [place], [place] [address]   . Special Requests 07/20/2018    Final                   Value:NONE Performed at [facility], [address] [place], [place] [address]   . Culture 07/20/2018 MULTIPLE SPECIES PRESENT, SUGGEST RECOLLECTION*  Final  . Report Status 07/20/2018 07/21/2018 FINAL   Final  . MRSA, PCR 07/20/2018 NEGATIVE  NEGATIVE Final  . Staphylococcus aureus 07/20/2018 NEGATIVE  NEGATIVE Final   Comment: (NOTE) The Xpert SA Assay (FDA approved for NASAL specimens in patients 37 years of age and older), is one component of a comprehensive surveillance program. It is not intended to diagnose infection nor to guide or monitor treatment. Performed at [facility], [address] [place], [place] [address]   . ABO/RH(D) 07/20/2018 A POS   Final  . Antibody Screen 07/20/2018 NEG   Final  . Sample Expiration 07/20/2018 08/03/2018,2359   Final  . Extend sample reason 07/20/2018    Final                   Value:NO TRANSFUSIONS OR PREGNANCY IN THE PAST 3 MONTHS Performed at [facility], [address] [place], [place] [address]   . Sed Rate 07/20/2018 58* 0 - 30 mm/hr Final   Performed at [facility], [address] [place], [place] [address]    Assessment:  Faith [name] is a 73 y.o. female with metastatic breast cancer s/p biopsy on 05/11/2013.  Pathology revealed invasive mammary carcinoma.  Axillary lymph node was positive for metastatic disease.  Tumor was ER positive (>90%), PR positive (80%) and HER-2/neu 2+ by IHC (equivocal by [facility]).  Clinical stage was T4dN1M1 (stage IV).  Chest CT on 05/09/2013 revealed a  2.8 cm soft tissue nodule in the left axillary tail.  There was bulky axillary adenopathy (largest 1.8 cm).  There were prominent mediastinal lymph nodes (7.3 cm AP window; 7 mm and 8 mm anterior to the central right mainstem bronchus, 11 mm central right hilar lymph node).  There was a small left pleural effusion.  Bone scan on 05/29/2013 revealed no evidence of metastatic disease.  Abdomen and pelvis CT on 05/31/2013 revealed no evidence of abdominal pelvic metastatic disease.   She received 1 cycle of neoadjuvant TCH and Perjeta beginning 05/2013.  She then received 3 cycles of Adriamycin and Cytoxan (AC) from 08/31/2013 - 10/19/2013.  She underwent bilateral mastectomy on 11/19/2013.  Left breast pathology revealed a 3.7 cm grade III invasive mammary carcinoma with extensive lymphovascular invasion within breast and skin.  There was metastatic carcinoma in 17 of 17 lymph nodes.  Right breast pathology revealed extensive lymphovascular invasion by metastatic mammary carcinoma.  One lymph node was negative for malignancy.  Pathologic stage was ypT4b ypN3a ypM1 (stage IV disease).  Repeat HER-2/neu testing by [facility] was equivocal.  She began Femara and Ibrance in 11/2013 (last cycle began 03/05/2018).    Chest, abdomen, and pelvis CT on 12/01/2017 revealed no evidence of metastatic disease.  CA27.29 has been followed: 60.3 on 05/18/2013, < 3.5 on 11/29/2013, 19.6 on 04/18/2014, 22.9 on 05/16/2014, 16.9 on 08/12/2014, 12.2 on 10/14/2014, 11.9  on 01/06/2015, 11.5 on 04/11/2015, 17.3 on 09/09/2015, 15.7 on 11/19/2015, 15.3 on 02/24/2016, 17.5 on 05/21/2016, 17.1 on 08/24/2016, 17.2 on 11/23/2016, 12.6 on 02/21/2017, 13.2 on 05/24/2017, 18.0 on 07/26/2017, 10.7 on 10/04/2017, 18.0 on 12/01/2017, 12.1 on 02/07/2018, and 13.1 on 02/28/2018.  She had a STEMI on 11/03/2015.  Cardiac cath revealed 100% occlusion of the proximal left circumflex artery.  She was treated at [facility] with primary PCI with drug  eluting stent (DES) and tirofiban.  Myocardial perfusion SPECT on 03/06/2018 revealed an EF of 59%, normal wall motion, and homogeneous tracer distribution throughout the myocardium.  She has osteoarthritis of her knees.  She underwent left knee replacement on 03/28/2018. She is scheduled to undergo right knee replacement on 08/01/2018.   Symptomatically, she is recovering well from her knee surgery.  She denies any complaints.  Plan: 1.   Review labs from 07/20/2018.   2.   Metastatic breast cancer             Patient has held her Faith [name] and Femara since prior to 03/28/2018.             Discuss restarting Femara as she is planning another knee replacement.             Patient declines.  Discuss reimaging studies to assess status of disease.   She would like to postpone.  Patient anticipates ability to proceed with imaging studies mid 08/2018. 3.   Osteoarthritis of the knees  Patient in the midst of bilateral knee replacements.  Faith [name] has been on hold secondary to surgeries.  Discuss clearance for Femara.  Patient declines. 4.   Health maintenance             Bone density study on 09/07/2018. 5.   Chest, abdomen, and pelvic CT 09/07/2018. 6.   Labs prior to scans: CBC with diff, CMP, CA27.29. 7.   RTC after imaging for MD assessment, review of imaging and discussion regarding direction of therapy.  I discussed the assessment and treatment plan with the patient.  The patient was provided an opportunity to ask questions and all were answered.  The patient agreed with the plan and demonstrated an understanding of the instructions.  The patient was advised to call back or seek an in person evaluation if the symptoms worsen or if the condition fails to improve as anticipated.  I provided 18 minutes (2:54 PM - 3:12 PM) of face-to-face video visit time during this this encounter and > 50% was spent counseling as documented under my assessment and plan.  I provided these services from the  [facility] office.   Nolon Stalls, MD, PhD  07/24/2018, 2:54 PM  I, Molly Dorshimer, am acting as Education administrator for [employer]. Mike Gip, MD, PhD.  I, Melissa C. Mike Gip, MD, have reviewed the above documentation for accuracy and completeness, and I agree with the above.

## 2018-07-24 ENCOUNTER — Encounter: Payer: Self-pay | Admitting: Hematology and Oncology

## 2018-07-24 ENCOUNTER — Inpatient Hospital Stay: Payer: Medicare Other | Attending: Hematology and Oncology | Admitting: Hematology and Oncology

## 2018-07-24 DIAGNOSIS — C50812 Malignant neoplasm of overlapping sites of left female breast: Secondary | ICD-10-CM | POA: Diagnosis not present

## 2018-07-24 DIAGNOSIS — Z170 Estrogen receptor positive status [ER+]: Secondary | ICD-10-CM | POA: Diagnosis not present

## 2018-07-28 ENCOUNTER — Other Ambulatory Visit: Payer: Self-pay

## 2018-07-28 ENCOUNTER — Other Ambulatory Visit
Admission: RE | Admit: 2018-07-28 | Discharge: 2018-07-28 | Disposition: A | Discharge status: Home / Self Care | Payer: Medicare Other | Source: Ambulatory Visit | Attending: Orthopedic Surgery | Admitting: Orthopedic Surgery

## 2018-07-28 DIAGNOSIS — Z01812 Encounter for preprocedural laboratory examination: Secondary | ICD-10-CM | POA: Diagnosis present

## 2018-07-28 DIAGNOSIS — Z1159 Encounter for screening for other viral diseases: Secondary | ICD-10-CM | POA: Insufficient documentation

## 2018-07-28 LAB — SARS CORONAVIRUS 2 (TAT 6-24 HRS): SARS Coronavirus 2: NEGATIVE

## 2018-07-31 MED ORDER — CEFAZOLIN SODIUM-DEXTROSE 2-4 GM/100ML-% IV SOLN
2.0000 g | Freq: Once | INTRAVENOUS | Status: AC
  Administered 2018-08-01: 2 g via INTRAVENOUS

## 2018-07-31 NOTE — TOC Benefit Eligibility Note (Signed)
Transition of Care [facility]) Benefit Eligibility Note    Patient Details  Name: Faith [name] MRN: [id number] Date of Birth: 04-18-1945   Medication/Dose: Lovenox 40mg  once daily for 14 days  Covered?: Yes  Tier: (Tier 4)  Prescription Coverage Preferred Pharmacy: [facility] or any retail pharmacy.  Spoke with Person/Company/Phone Number:: Laverna Peace with [employer] at [phone]  Co-Pay: $100 estimated copay  Prior Approval: No  Deductible: Unmet($95 deductible, nothing applied.)  Additional Notes: Generic Enoxaparin covered with no PA required, Considered Tier 4.  Estimated copay: $100.    Dannette Barbara Phone Number: [phone] or [phone] 07/31/2018, 3:18 PM

## 2018-07-31 NOTE — TOC Progression Note (Signed)
Transition of Care [facility]) - Progression Note    Patient Details  Name: Faith [name] MRN: [id number] Date of Birth: 22-Mar-1945  Transition of Care [facility]) CM/SW Contact  Su Hilt, RN Phone Number: 07/31/2018, 9:42 AM  Clinical Narrative:     Requested the price of Lovenox will notify the patient once obtained       Expected Discharge Plan and Services                                                 Social Determinants of Health (SDOH) Interventions    Readmission Risk Interventions No flowsheet data found.

## 2018-08-01 ENCOUNTER — Other Ambulatory Visit: Payer: Self-pay

## 2018-08-01 ENCOUNTER — Inpatient Hospital Stay: Payer: Medicare Other

## 2018-08-01 ENCOUNTER — Inpatient Hospital Stay
Admission: RE | Admit: 2018-08-01 | Discharge: 2018-08-04 | DRG: 470 | Disposition: A | Discharge status: Home / Self Care | Payer: Medicare Other | Attending: Orthopedic Surgery | Admitting: Orthopedic Surgery

## 2018-08-01 ENCOUNTER — Encounter
Admission: RE | Disposition: A | Discharge status: Home / Self Care | Payer: Self-pay | Source: Home / Self Care | Attending: Orthopedic Surgery

## 2018-08-01 ENCOUNTER — Inpatient Hospital Stay: Payer: Medicare Other | Admitting: Anesthesiology

## 2018-08-01 ENCOUNTER — Encounter: Payer: Self-pay | Admitting: *Deleted

## 2018-08-01 DIAGNOSIS — Z96652 Presence of left artificial knee joint: Secondary | ICD-10-CM | POA: Diagnosis present

## 2018-08-01 DIAGNOSIS — Z96651 Presence of right artificial knee joint: Secondary | ICD-10-CM

## 2018-08-01 DIAGNOSIS — M1711 Unilateral primary osteoarthritis, right knee: Secondary | ICD-10-CM | POA: Diagnosis present

## 2018-08-01 DIAGNOSIS — I10 Essential (primary) hypertension: Secondary | ICD-10-CM | POA: Diagnosis present

## 2018-08-01 DIAGNOSIS — Z79899 Other long term (current) drug therapy: Secondary | ICD-10-CM

## 2018-08-01 DIAGNOSIS — C50812 Malignant neoplasm of overlapping sites of left female breast: Secondary | ICD-10-CM | POA: Diagnosis present

## 2018-08-01 DIAGNOSIS — Z9013 Acquired absence of bilateral breasts and nipples: Secondary | ICD-10-CM

## 2018-08-01 DIAGNOSIS — Z170 Estrogen receptor positive status [ER+]: Secondary | ICD-10-CM

## 2018-08-01 DIAGNOSIS — I252 Old myocardial infarction: Secondary | ICD-10-CM | POA: Diagnosis not present

## 2018-08-01 DIAGNOSIS — E785 Hyperlipidemia, unspecified: Secondary | ICD-10-CM | POA: Diagnosis present

## 2018-08-01 DIAGNOSIS — Z87891 Personal history of nicotine dependence: Secondary | ICD-10-CM | POA: Diagnosis not present

## 2018-08-01 DIAGNOSIS — I2510 Atherosclerotic heart disease of native coronary artery without angina pectoris: Secondary | ICD-10-CM | POA: Diagnosis present

## 2018-08-01 DIAGNOSIS — G8918 Other acute postprocedural pain: Secondary | ICD-10-CM

## 2018-08-01 DIAGNOSIS — Z955 Presence of coronary angioplasty implant and graft: Secondary | ICD-10-CM | POA: Diagnosis not present

## 2018-08-01 DIAGNOSIS — Z79811 Long term (current) use of aromatase inhibitors: Secondary | ICD-10-CM | POA: Diagnosis not present

## 2018-08-01 HISTORY — PX: TOTAL KNEE ARTHROPLASTY: SHX125

## 2018-08-01 LAB — CBC
HCT: 37.3 % (ref 36.0–46.0)
Hemoglobin: 11.7 g/dL — ABNORMAL LOW (ref 12.0–15.0)
MCH: 29 pg (ref 26.0–34.0)
MCHC: 31.4 g/dL (ref 30.0–36.0)
MCV: 92.3 fL (ref 80.0–100.0)
Platelets: 204 10*3/uL (ref 150–400)
RBC: 4.04 MIL/uL (ref 3.87–5.11)
RDW: 14 % (ref 11.5–15.5)
WBC: 9.1 10*3/uL (ref 4.0–10.5)
nRBC: 0 % (ref 0.0–0.2)

## 2018-08-01 LAB — CREATININE, SERUM
Creatinine, Ser: 0.92 mg/dL (ref 0.44–1.00)
GFR calc Af Amer: >60 mL/min (ref >60)
GFR calc non Af Amer: >60 mL/min (ref >60)

## 2018-08-01 SURGERY — ARTHROPLASTY, KNEE, TOTAL
Anesthesia: Spinal | Site: Knee | Laterality: Right

## 2018-08-01 MED ORDER — LORATADINE 10 MG PO TABS
10.0000 mg | ORAL_TABLET | Freq: Every day | ORAL | Status: DC
  Administered 2018-08-02 – 2018-08-03 (×2): 10 mg via ORAL
  Filled 2018-08-01 (×4): qty 1

## 2018-08-01 MED ORDER — MENTHOL 3 MG MT LOZG
1.0000 | LOZENGE | OROMUCOSAL | Status: DC | PRN
  Filled 2018-08-01: qty 9

## 2018-08-01 MED ORDER — TRANEXAMIC ACID 1000 MG/10ML IV SOLN
INTRAVENOUS | Status: AC
  Filled 2018-08-01: qty 10

## 2018-08-01 MED ORDER — OXYCODONE HCL 5 MG PO TABS
10.0000 mg | ORAL_TABLET | ORAL | Status: DC | PRN
  Filled 2018-08-01: qty 2
  Filled 2018-08-01: qty 3

## 2018-08-01 MED ORDER — BIOTIN 5 MG PO CAPS: 5.0000 mg | ORAL_CAPSULE | Freq: Two times a day (BID) | ORAL | Status: DC

## 2018-08-01 MED ORDER — POLYVINYL ALCOHOL 1.4 % OP SOLN
1.0000 [drp] | Freq: Every day | OPHTHALMIC | Status: DC
  Administered 2018-08-01 – 2018-08-02 (×2): 1 [drp] via OPHTHALMIC
  Filled 2018-08-01: qty 15

## 2018-08-01 MED ORDER — ACETAMINOPHEN 10 MG/ML IV SOLN
INTRAVENOUS | Status: DC | PRN
  Administered 2018-08-01: 1000 mg via INTRAVENOUS

## 2018-08-01 MED ORDER — FENTANYL CITRATE (PF) 100 MCG/2ML IJ SOLN: 25.0000 ug | INTRAMUSCULAR | Status: DC | PRN

## 2018-08-01 MED ORDER — LACTATED RINGERS IV SOLN
INTRAVENOUS | Status: DC
  Administered 2018-08-01: 07:00:00 via INTRAVENOUS

## 2018-08-01 MED ORDER — MORPHINE SULFATE (PF) 10 MG/ML IV SOLN
INTRAVENOUS | Status: AC
  Filled 2018-08-01: qty 1

## 2018-08-01 MED ORDER — OXYCODONE HCL 5 MG PO TABS: 5.0000 mg | ORAL_TABLET | Freq: Once | ORAL | Status: DC | PRN

## 2018-08-01 MED ORDER — BUPIVACAINE LIPOSOME 1.3 % IJ SUSP
INTRAMUSCULAR | Status: AC
  Filled 2018-08-01: qty 20

## 2018-08-01 MED ORDER — BUPIVACAINE-EPINEPHRINE (PF) 0.25% -1:200000 IJ SOLN
INTRAMUSCULAR | Status: DC | PRN
  Administered 2018-08-01: 30 mL

## 2018-08-01 MED ORDER — DOCUSATE SODIUM 100 MG PO CAPS
100.0000 mg | ORAL_CAPSULE | Freq: Two times a day (BID) | ORAL | Status: DC
  Administered 2018-08-01 – 2018-08-04 (×5): 100 mg via ORAL
  Filled 2018-08-01 (×7): qty 1

## 2018-08-01 MED ORDER — TRANEXAMIC ACID-NACL 1000-0.7 MG/100ML-% IV SOLN
INTRAVENOUS | Status: DC | PRN
  Administered 2018-08-01: 1000 mg via INTRAVENOUS

## 2018-08-01 MED ORDER — ACETAMINOPHEN 500 MG PO TABS
1000.0000 mg | ORAL_TABLET | Freq: Four times a day (QID) | ORAL | Status: AC
  Administered 2018-08-01 – 2018-08-02 (×4): 1000 mg via ORAL
  Filled 2018-08-01 (×4): qty 2

## 2018-08-01 MED ORDER — PROPOFOL 500 MG/50ML IV EMUL
INTRAVENOUS | Status: DC | PRN
  Administered 2018-08-01: 30 ug/kg/min via INTRAVENOUS

## 2018-08-01 MED ORDER — ACETAMINOPHEN 325 MG PO TABS: 325.0000 mg | ORAL_TABLET | Freq: Four times a day (QID) | ORAL | Status: DC | PRN

## 2018-08-01 MED ORDER — CEFAZOLIN SODIUM-DEXTROSE 2-4 GM/100ML-% IV SOLN
2.0000 g | Freq: Four times a day (QID) | INTRAVENOUS | Status: AC
  Administered 2018-08-01 (×2): 2 g via INTRAVENOUS
  Filled 2018-08-01 (×2): qty 100

## 2018-08-01 MED ORDER — SODIUM CHLORIDE (PF) 0.9 % IJ SOLN
INTRAMUSCULAR | Status: AC
  Filled 2018-08-01: qty 100

## 2018-08-01 MED ORDER — ATORVASTATIN CALCIUM 20 MG PO TABS
80.0000 mg | ORAL_TABLET | Freq: Every day | ORAL | Status: DC
  Administered 2018-08-01 – 2018-08-03 (×3): 80 mg via ORAL
  Filled 2018-08-01 (×4): qty 4

## 2018-08-01 MED ORDER — PROPOFOL 10 MG/ML IV BOLUS
INTRAVENOUS | Status: AC
  Filled 2018-08-01: qty 20

## 2018-08-01 MED ORDER — SODIUM CHLORIDE 0.9 % IV SOLN
INTRAVENOUS | Status: DC
  Administered 2018-08-01 (×2): via INTRAVENOUS

## 2018-08-01 MED ORDER — DIPHENHYDRAMINE HCL 12.5 MG/5ML PO ELIX: 12.5000 mg | ORAL_SOLUTION | ORAL | Status: DC | PRN

## 2018-08-01 MED ORDER — FAMOTIDINE 20 MG PO TABS
ORAL_TABLET | ORAL | Status: AC
  Administered 2018-08-01: 06:00:00 20 mg via ORAL
  Filled 2018-08-01: qty 1

## 2018-08-01 MED ORDER — MAGNESIUM CITRATE PO SOLN
1.0000 | Freq: Once | ORAL | Status: DC | PRN
  Filled 2018-08-01: qty 296

## 2018-08-01 MED ORDER — LIDOCAINE HCL (CARDIAC) PF 100 MG/5ML IV SOSY
PREFILLED_SYRINGE | INTRAVENOUS | Status: DC | PRN
  Administered 2018-08-01: 100 mg via INTRAVENOUS

## 2018-08-01 MED ORDER — SODIUM CHLORIDE 0.9 % IV SOLN
INTRAVENOUS | Status: DC | PRN
  Administered 2018-08-01: 08:00:00 15 ug/min via INTRAVENOUS

## 2018-08-01 MED ORDER — ZOLPIDEM TARTRATE 5 MG PO TABS: 5.0000 mg | ORAL_TABLET | Freq: Every evening | ORAL | Status: DC | PRN

## 2018-08-01 MED ORDER — BUPIVACAINE HCL (PF) 0.5 % IJ SOLN
INTRAMUSCULAR | Status: DC | PRN
  Administered 2018-08-01: 3 mL

## 2018-08-01 MED ORDER — SODIUM CHLORIDE 0.9 % IV SOLN
INTRAVENOUS | Status: DC | PRN
  Administered 2018-08-01: 09:00:00 60 mL

## 2018-08-01 MED ORDER — ASPIRIN EC 81 MG PO TBEC
81.0000 mg | DELAYED_RELEASE_TABLET | Freq: Every day | ORAL | Status: DC
  Administered 2018-08-02 – 2018-08-04 (×3): 81 mg via ORAL
  Filled 2018-08-01 (×4): qty 1

## 2018-08-01 MED ORDER — CARBOXYMETHYLCELLUL-GLYCERIN 1-0.9 % OP GEL: 1.0000 [drp] | Freq: Every day | OPHTHALMIC | Status: DC

## 2018-08-01 MED ORDER — MORPHINE SULFATE 10 MG/ML IJ SOLN
INTRAMUSCULAR | Status: DC | PRN
  Administered 2018-08-01: 10 mg

## 2018-08-01 MED ORDER — ALUM & MAG HYDROXIDE-SIMETH 200-200-20 MG/5ML PO SUSP: 30.0000 mL | ORAL | Status: DC | PRN

## 2018-08-01 MED ORDER — BISACODYL 5 MG PO TBEC: 5.0000 mg | DELAYED_RELEASE_TABLET | Freq: Every day | ORAL | Status: DC | PRN

## 2018-08-01 MED ORDER — OXYCODONE HCL 5 MG/5ML PO SOLN: 5.0000 mg | Freq: Once | ORAL | Status: DC | PRN

## 2018-08-01 MED ORDER — OXYCODONE HCL 5 MG PO TABS
5.0000 mg | ORAL_TABLET | ORAL | Status: DC | PRN
  Administered 2018-08-01 (×2): 5 mg via ORAL
  Administered 2018-08-02: 10 mg via ORAL
  Administered 2018-08-02: 5 mg via ORAL
  Administered 2018-08-02: 10 mg via ORAL
  Administered 2018-08-03: 11:00:00 5 mg via ORAL
  Administered 2018-08-03: 10 mg via ORAL
  Filled 2018-08-01 (×3): qty 2
  Filled 2018-08-01: qty 1
  Filled 2018-08-01: qty 2
  Filled 2018-08-01 (×2): qty 1

## 2018-08-01 MED ORDER — FENTANYL CITRATE (PF) 100 MCG/2ML IJ SOLN
INTRAMUSCULAR | Status: DC | PRN
  Administered 2018-08-01 (×2): 50 ug via INTRAVENOUS

## 2018-08-01 MED ORDER — MAGNESIUM HYDROXIDE 400 MG/5ML PO SUSP
30.0000 mL | Freq: Every day | ORAL | Status: DC | PRN
  Administered 2018-08-02: 30 mL via ORAL
  Filled 2018-08-01: qty 30

## 2018-08-01 MED ORDER — VITAMIN D 25 MCG (1000 UNIT) PO TABS
2000.0000 [IU] | ORAL_TABLET | Freq: Two times a day (BID) | ORAL | Status: DC
  Administered 2018-08-01 – 2018-08-04 (×6): 2000 [IU] via ORAL
  Filled 2018-08-01 (×6): qty 2

## 2018-08-01 MED ORDER — ONDANSETRON HCL 4 MG PO TABS: 4.0000 mg | ORAL_TABLET | Freq: Four times a day (QID) | ORAL | Status: DC | PRN

## 2018-08-01 MED ORDER — METOCLOPRAMIDE HCL 5 MG/ML IJ SOLN: 5.0000 mg | Freq: Three times a day (TID) | INTRAMUSCULAR | Status: DC | PRN

## 2018-08-01 MED ORDER — PHENYLEPHRINE HCL (PRESSORS) 10 MG/ML IV SOLN
INTRAVENOUS | Status: DC | PRN
  Administered 2018-08-01 (×2): 100 ug via INTRAVENOUS

## 2018-08-01 MED ORDER — NEOMYCIN-POLYMYXIN B GU 40-200000 IR SOLN
Status: AC
  Filled 2018-08-01: qty 20

## 2018-08-01 MED ORDER — CEFAZOLIN SODIUM-DEXTROSE 2-4 GM/100ML-% IV SOLN
INTRAVENOUS | Status: AC
  Filled 2018-08-01: qty 100

## 2018-08-01 MED ORDER — CALCIUM CARBONATE-VITAMIN D3 600-400 MG-UNIT PO TABS: 1.0000 | ORAL_TABLET | Freq: Two times a day (BID) | ORAL | Status: DC

## 2018-08-01 MED ORDER — ENOXAPARIN SODIUM 30 MG/0.3ML ~~LOC~~ SOLN
30.0000 mg | Freq: Two times a day (BID) | SUBCUTANEOUS | Status: DC
  Administered 2018-08-02 – 2018-08-04 (×5): 30 mg via SUBCUTANEOUS
  Filled 2018-08-01 (×5): qty 0.3

## 2018-08-01 MED ORDER — BUPIVACAINE HCL (PF) 0.5 % IJ SOLN
INTRAMUSCULAR | Status: AC
  Filled 2018-08-01: qty 10

## 2018-08-01 MED ORDER — HYDROMORPHONE HCL 1 MG/ML IJ SOLN
0.5000 mg | INTRAMUSCULAR | Status: DC | PRN
  Administered 2018-08-02: 1 mg via INTRAVENOUS
  Filled 2018-08-01: qty 1

## 2018-08-01 MED ORDER — METHOCARBAMOL 500 MG PO TABS: 500.0000 mg | ORAL_TABLET | Freq: Four times a day (QID) | ORAL | Status: DC | PRN

## 2018-08-01 MED ORDER — METOPROLOL TARTRATE 25 MG PO TABS
25.0000 mg | ORAL_TABLET | Freq: Two times a day (BID) | ORAL | Status: DC
  Administered 2018-08-01 – 2018-08-04 (×4): 25 mg via ORAL
  Filled 2018-08-01 (×5): qty 1

## 2018-08-01 MED ORDER — METHOCARBAMOL 1000 MG/10ML IJ SOLN
500.0000 mg | Freq: Four times a day (QID) | INTRAVENOUS | Status: DC | PRN
  Filled 2018-08-01: qty 5

## 2018-08-01 MED ORDER — VITAMIN C 500 MG PO TABS
1000.0000 mg | ORAL_TABLET | Freq: Every day | ORAL | Status: DC
  Administered 2018-08-02 – 2018-08-04 (×3): 1000 mg via ORAL
  Filled 2018-08-01 (×3): qty 2

## 2018-08-01 MED ORDER — TRAMADOL HCL 50 MG PO TABS
50.0000 mg | ORAL_TABLET | Freq: Four times a day (QID) | ORAL | Status: DC
  Administered 2018-08-01 – 2018-08-04 (×10): 50 mg via ORAL
  Filled 2018-08-01 (×10): qty 1

## 2018-08-01 MED ORDER — FENTANYL CITRATE (PF) 100 MCG/2ML IJ SOLN
INTRAMUSCULAR | Status: AC
  Filled 2018-08-01: qty 2

## 2018-08-01 MED ORDER — PHENOL 1.4 % MT LIQD
1.0000 | OROMUCOSAL | Status: DC | PRN
  Filled 2018-08-01: qty 177

## 2018-08-01 MED ORDER — NITROGLYCERIN 0.4 MG SL SUBL: 0.4000 mg | SUBLINGUAL_TABLET | SUBLINGUAL | Status: DC | PRN

## 2018-08-01 MED ORDER — ACETAMINOPHEN 10 MG/ML IV SOLN
INTRAVENOUS | Status: AC
  Filled 2018-08-01: qty 100

## 2018-08-01 MED ORDER — KETOROLAC TROMETHAMINE 30 MG/ML IJ SOLN: INTRAMUSCULAR | Status: DC | PRN

## 2018-08-01 MED ORDER — ONDANSETRON HCL 4 MG/2ML IJ SOLN: 4.0000 mg | Freq: Four times a day (QID) | INTRAMUSCULAR | Status: DC | PRN

## 2018-08-01 MED ORDER — CALCIUM CARBONATE-VITAMIN D 500-200 MG-UNIT PO TABS
1.0000 | ORAL_TABLET | Freq: Two times a day (BID) | ORAL | Status: DC
  Administered 2018-08-01 – 2018-08-04 (×6): 1 via ORAL
  Filled 2018-08-01 (×6): qty 1

## 2018-08-01 MED ORDER — NEOMYCIN-POLYMYXIN B GU 40-200000 IR SOLN
Status: DC | PRN
  Administered 2018-08-01: 14 mL

## 2018-08-01 MED ORDER — FAMOTIDINE 20 MG PO TABS
20.0000 mg | ORAL_TABLET | Freq: Once | ORAL | Status: AC
  Administered 2018-08-01: 06:00:00 20 mg via ORAL

## 2018-08-01 MED ORDER — METOCLOPRAMIDE HCL 10 MG PO TABS: 5.0000 mg | ORAL_TABLET | Freq: Three times a day (TID) | ORAL | Status: DC | PRN

## 2018-08-01 MED ORDER — PROPOFOL 500 MG/50ML IV EMUL
INTRAVENOUS | Status: AC
  Filled 2018-08-01: qty 50

## 2018-08-01 MED ORDER — COQ10 100 MG PO CAPS: 100.0000 mg | ORAL_CAPSULE | Freq: Every day | ORAL | Status: DC

## 2018-08-01 MED ORDER — KETOROLAC TROMETHAMINE 30 MG/ML IJ SOLN
INTRAMUSCULAR | Status: AC
  Filled 2018-08-01: qty 1

## 2018-08-01 MED ORDER — SODIUM CHLORIDE 0.9 % IV SOLN
Freq: Once | INTRAVENOUS | Status: AC
  Administered 2018-08-01: 09:00:00 via INTRAVENOUS

## 2018-08-01 MED ORDER — VITAMIN D3 50 MCG (2000 UT) PO TABS: 2000.0000 [IU] | ORAL_TABLET | Freq: Two times a day (BID) | ORAL | Status: DC

## 2018-08-01 MED ORDER — BUPIVACAINE-EPINEPHRINE (PF) 0.25% -1:200000 IJ SOLN
INTRAMUSCULAR | Status: AC
  Filled 2018-08-01: qty 30

## 2018-08-01 MED ORDER — LIDOCAINE HCL (PF) 2 % IJ SOLN
INTRAMUSCULAR | Status: AC
  Filled 2018-08-01: qty 10

## 2018-08-01 MED ORDER — LOSARTAN POTASSIUM 50 MG PO TABS
50.0000 mg | ORAL_TABLET | Freq: Every day | ORAL | Status: DC
  Administered 2018-08-03 – 2018-08-04 (×2): 50 mg via ORAL
  Filled 2018-08-01 (×2): qty 1

## 2018-08-01 SURGICAL SUPPLY — 72 items
APL PRP STRL LF DISP 70% ISPRP (MISCELLANEOUS) ×2
BANDAGE ACE 6X5 VEL STRL LF (GAUZE/BANDAGES/DRESSINGS) ×3 IMPLANT
BLADE SAGITTAL 25.0X1.19X90 (BLADE) ×2 IMPLANT
BLADE SAGITTAL 25.0X1.19X90MM (BLADE) ×1
BLOCK CUTTING FEMUR 4 RT (MISCELLANEOUS) ×2 IMPLANT
BLOCK CUTTING TIBIAL 3 RT (MISCELLANEOUS) ×2 IMPLANT
BLOCK CUTTING TIBIAL 4 RT MIS (MISCELLANEOUS) ×2 IMPLANT
CANISTER SUCT 1200ML W/VALVE (MISCELLANEOUS) ×3 IMPLANT
CANISTER SUCT 3000ML PPV (MISCELLANEOUS) ×6 IMPLANT
CEMENT HV SMART SET (Cement) ×6 IMPLANT
CHLORAPREP W/TINT 26 (MISCELLANEOUS) ×6 IMPLANT
COOLER POLAR GLACIER W/PUMP (MISCELLANEOUS) ×3 IMPLANT
COVER WAND RF STERILE (DRAPES) ×3 IMPLANT
CUFF TOURN SGL QUICK 24 (TOURNIQUET CUFF)
CUFF TOURN SGL QUICK 30 (TOURNIQUET CUFF)
CUFF TRNQT CYL 24X4X16.5-23 (TOURNIQUET CUFF) IMPLANT
CUFF TRNQT CYL 30X4X21-28X (TOURNIQUET CUFF) IMPLANT
DRAPE SHEET LG 3/4 BI-LAMINATE (DRAPES) ×6 IMPLANT
ELECT CAUTERY BLADE 6.4 (BLADE) ×3 IMPLANT
ELECT REM PT RETURN 9FT ADLT (ELECTROSURGICAL) ×3
ELECTRODE REM PT RTRN 9FT ADLT (ELECTROSURGICAL) ×1 IMPLANT
FEMORAL COMP SZ4 RIGHT SPHERE (Femur) ×2 IMPLANT
FEMUR BONE MODEL (MISCELLANEOUS) ×2 IMPLANT
GAUZE SPONGE 4X4 12PLY STRL (GAUZE/BANDAGES/DRESSINGS) ×3 IMPLANT
GAUZE XEROFORM 1X8 LF (GAUZE/BANDAGES/DRESSINGS) ×3 IMPLANT
GLOVE BIOGEL PI IND STRL 9 (GLOVE) ×1 IMPLANT
GLOVE BIOGEL PI INDICATOR 9 (GLOVE) ×2
GLOVE INDICATOR 8.0 STRL GRN (GLOVE) ×3 IMPLANT
GLOVE SURG ORTHO 8.0 STRL STRW (GLOVE) ×3 IMPLANT
GLOVE SURG SYN 9.0  PF PI (GLOVE) ×2
GLOVE SURG SYN 9.0 PF PI (GLOVE) ×1 IMPLANT
GOWN SRG 2XL LVL 4 RGLN SLV (GOWNS) ×1 IMPLANT
GOWN STRL NON-REIN 2XL LVL4 (GOWNS) ×3
GOWN STRL REUS W/ TWL LRG LVL3 (GOWN DISPOSABLE) ×1 IMPLANT
GOWN STRL REUS W/ TWL XL LVL3 (GOWN DISPOSABLE) ×1 IMPLANT
GOWN STRL REUS W/TWL LRG LVL3 (GOWN DISPOSABLE) ×3
GOWN STRL REUS W/TWL XL LVL3 (GOWN DISPOSABLE) ×3
HOLDER FOLEY CATH W/STRAP (MISCELLANEOUS) ×3 IMPLANT
HOOD PEEL AWAY FLYTE STAYCOOL (MISCELLANEOUS) ×6 IMPLANT
INSERT TIBIAL SZ4 RIGHT 10MM (Insert) ×2 IMPLANT
KIT PREVENA INCISION MGT20CM45 (CANNISTER) ×2 IMPLANT
KIT TURNOVER KIT A (KITS) ×3 IMPLANT
NDL SAFETY ECLIPSE 18X1.5 (NEEDLE) ×1 IMPLANT
NDL SPNL 18GX3.5 QUINCKE PK (NEEDLE) ×1 IMPLANT
NDL SPNL 20GX3.5 QUINCKE YW (NEEDLE) ×1 IMPLANT
NEEDLE HYPO 18GX1.5 SHARP (NEEDLE) ×3
NEEDLE SPNL 18GX3.5 QUINCKE PK (NEEDLE) ×3 IMPLANT
NEEDLE SPNL 20GX3.5 QUINCKE YW (NEEDLE) ×3 IMPLANT
NS IRRIG 1000ML POUR BTL (IV SOLUTION) ×3 IMPLANT
PACK TOTAL KNEE (MISCELLANEOUS) ×3 IMPLANT
PAD WRAPON POLAR KNEE (MISCELLANEOUS) ×1 IMPLANT
PATELLA RESURFACING MEDACTA 02 (Bone Implant) ×2 IMPLANT
PULSAVAC PLUS IRRIG FAN TIP (DISPOSABLE) ×3
SCALPEL PROTECTED #10 DISP (BLADE) ×6 IMPLANT
SOL .9 NS 3000ML IRR  AL (IV SOLUTION) ×2
SOL .9 NS 3000ML IRR AL (IV SOLUTION) ×1
SOL .9 NS 3000ML IRR UROMATIC (IV SOLUTION) ×1 IMPLANT
STAPLER SKIN PROX 35W (STAPLE) ×3 IMPLANT
STEM EXTENSION 11MMX30MM (Stem) ×2 IMPLANT
SUCTION FRAZIER HANDLE 10FR (MISCELLANEOUS) ×2
SUCTION TUBE FRAZIER 10FR DISP (MISCELLANEOUS) ×1 IMPLANT
SUT DVC 2 QUILL PDO  T11 36X36 (SUTURE) ×2
SUT DVC 2 QUILL PDO T11 36X36 (SUTURE) ×1 IMPLANT
SUT V-LOC 90 ABS DVC 3-0 CL (SUTURE) ×3 IMPLANT
SYR 20CC LL (SYRINGE) ×3 IMPLANT
SYR 50ML LL SCALE MARK (SYRINGE) ×6 IMPLANT
TIP FAN IRRIG PULSAVAC PLUS (DISPOSABLE) ×1 IMPLANT
TOWEL OR 17X26 4PK STRL BLUE (TOWEL DISPOSABLE) ×3 IMPLANT
TOWER CARTRIDGE SMART MIX (DISPOSABLE) ×3 IMPLANT
TRAY FOLEY MTR SLVR 16FR STAT (SET/KITS/TRAYS/PACK) ×3 IMPLANT
TRAY TIBIAL FIXED T314 RIGHT (Miscellaneous) ×2 IMPLANT
WRAPON POLAR PAD KNEE (MISCELLANEOUS) ×3

## 2018-08-01 NOTE — Evaluation (Signed)
Physical Therapy Evaluation Patient Details Name: Faith [name] MRN: [id number] DOB: 10/22/1945 Today's Date: 08/01/2018   History of Present Illness  Pt is a 73 y.o. 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  PMH includes L TKA 03/28/18, htn, CAD, h/o MI, cardiac stents, L breast CA, and B knee arthritis.  Clinical Impression  Prior to hospital admission, pt was independent with functional mobility.  Pt lives with her spouse in 1 level home with steps to enter.  Currently pt is min assist semi-supine to sit; CGA to min assist with transfers; and CGA to ambulate a few feet bed to recliner with RW.  Pt unable to perform R LE SLR independently but no knee buckling noted during session's activities.  7.5/10 R knee pain beginning of session and 7/10 R knee pain end of session at rest (nurse notified).  Pt would benefit from skilled PT to address noted impairments and functional limitations (see below for any additional details).  Upon hospital discharge, recommend OP PT (pt reports having OP PT appointment set up for this Friday).    Follow Up Recommendations Outpatient PT    Equipment Recommendations  Rolling walker with 5" wheels;3in1 (PT)(pt already has this DME)    Recommendations for Other Services       Precautions / Restrictions Precautions Precautions: Knee;Fall Precaution Booklet Issued: Yes (comment) Precaution Comments: wound vac Restrictions Weight Bearing Restrictions: Yes RLE Weight Bearing: Weight bearing as tolerated      Mobility  Bed Mobility Overal bed mobility: Needs Assistance Bed Mobility: Supine to Sit     Supine to sit: Min assist;HOB elevated     General bed mobility comments: assist for R LE; increased effort/time to perform; vc's to scoot to edge of bed and square hips  Transfers Overall transfer level: Needs assistance Equipment used: Rolling walker (2 wheeled) Transfers: Sit to/from Stand Sit to Stand: Min guard;Min assist         General transfer  comment: minimal assist to initiate and come to full stand; vc's for UE/LE placement  Ambulation/Gait Ambulation/Gait assistance: Min guard Gait Distance (Feet): 3 Feet(bed to recliner) Assistive device: Rolling walker (2 wheeled) Gait Pattern/deviations: Step-to pattern;Antalgic Gait velocity: mildly decreased   General Gait Details: decreased stance time R LE; steady with RW  Stairs            Wheelchair Mobility    Modified Rankin (Stroke Patients Only)       Balance Overall balance assessment: Needs assistance Sitting-balance support: No upper extremity supported;Feet supported Sitting balance-Leahy Scale: Good Sitting balance - Comments: steady sitting reaching within BOS   Standing balance support: Single extremity supported Standing balance-Leahy Scale: Fair Standing balance comment: pt requiring at least single UE support for static standing balance                             Pertinent Vitals/Pain Pain Assessment: 0-10 Pain Score: 7  Pain Location: R knee Pain Descriptors / Indicators: Aching;Sore Pain Intervention(s): Limited activity within patient's tolerance;Monitored during session;Premedicated before session;Repositioned;Other (comment)(polar care applied and activated)  Vitals stable and WFL throughout treatment session.  BP 138/54 resting in bed beginning of session.    Home Living Family/patient expects to be discharged to:: Private residence Living Arrangements: Spouse/significant other Available Help at Discharge: Family Type of Home: House Home Access: Stairs to enter Entrance Stairs-Rails: None Entrance Stairs-Number of Steps: 2 platform steps Home Layout: One level Home Equipment: Shower seat -  built in;Grab bars - tub/shower;Cane - single point;Walker - 2 wheels;Bedside commode;Hand held shower head;Toilet riser(hurry-cane)      Prior Function Level of Independence: Independent         Comments: No falls in past 6  months     Hand Dominance   Dominant Hand: Right    Extremity/Trunk Assessment   Upper Extremity Assessment Upper Extremity Assessment: Overall WFL for tasks assessed    Lower Extremity Assessment RLE Deficits / Details: good R quad set; unable to perform R LE SLR independently; at least 3/5 AROM DF/PF RLE: Unable to fully assess due to pain LLE Deficits / Details: strength and ROM WFL    Cervical / Trunk Assessment Cervical / Trunk Assessment: Normal  Communication   Communication: No difficulties  Cognition Arousal/Alertness: Awake/alert Behavior During Therapy: WFL for tasks assessed/performed Overall Cognitive Status: Within Functional Limits for tasks assessed                                        General Comments General comments (skin integrity, edema, etc.): R knee wound vac and dressings in place.  Nursing cleared pt for participation in physical therapy.  Pt agreeable to PT session.    Exercises Total Joint Exercises Ankle Circles/Pumps: AROM;Strengthening;Both;5 reps;Supine Quad Sets: AROM;Strengthening;Right;5 reps;Supine Short Arc Quad: AAROM;Strengthening;Right;5 reps;Supine Heel Slides: AAROM;Strengthening;Right;5 reps;Supine Hip ABduction/ADduction: AAROM;Strengthening;Right;5 reps;Supine Straight Leg Raises: AAROM;Strengthening;Right;5 reps;Supine Goniometric ROM: R knee extension 10 degrees short of neutral semi-supine in bed; R knee flexion AROM 80 degrees sitting edge of bed   Assessment/Plan    PT Assessment Patient needs continued PT services  PT Problem List Decreased strength;Decreased range of motion;Decreased activity tolerance;Decreased balance;Decreased mobility;Decreased knowledge of use of DME;Decreased knowledge of precautions;Pain;Decreased skin integrity       PT Treatment Interventions DME instruction;Gait training;Stair training;Functional mobility training;Therapeutic activities;Therapeutic exercise;Balance  training;Patient/family education    PT Goals (Current goals can be found in the Care Plan section)  Acute Rehab PT Goals Patient Stated Goal: to go home and go to OP PT PT Goal Formulation: With patient Time For Goal Achievement: 08/15/18 Potential to Achieve Goals: Good    Frequency BID   Barriers to discharge        Co-evaluation               AM-PAC PT "6 Clicks" Mobility  Outcome Measure Help needed turning from your back to your side while in a flat bed without using bedrails?: A Little Help needed moving from lying on your back to sitting on the side of a flat bed without using bedrails?: A Little Help needed moving to and from a bed to a chair (including a wheelchair)?: A Little Help needed standing up from a chair using your arms (e.g., wheelchair or bedside chair)?: A Little Help needed to walk in hospital room?: A Little Help needed climbing 3-5 steps with a railing? : A Little 6 Click Score: 18    End of Session Equipment Utilized During Treatment: Gait belt Activity Tolerance: Patient tolerated treatment well Patient left: in chair;with call bell/phone within reach;with chair alarm set;with SCD's reapplied(B heels elevated via towel rolls; polar care in place and activated) Nurse Communication: Mobility status;Precautions;Weight bearing status;Other (comment)(Pt's pain status) PT Visit Diagnosis: Other abnormalities of gait and mobility (R26.89);Muscle weakness (generalized) (M62.81);Difficulty in walking, not elsewhere classified (R26.2);Pain Pain - Right/Left: Right Pain - part of body: Knee  Time: 7639-4320 PT Time Calculation (min) (ACUTE ONLY): 42 min   Charges:   PT Evaluation $PT Eval Low Complexity: 1 Low PT Treatments $Therapeutic Exercise: 8-22 mins $Therapeutic Activity: 8-22 mins       Leitha Bleak, PT 08/01/18, 4:19 PM [phone]

## 2018-08-01 NOTE — Anesthesia Preprocedure Evaluation (Addendum)
Anesthesia Evaluation  Patient identified by MRN, date of birth, ID band Patient awake    Reviewed: Allergy & Precautions, H&P , NPO status , Patient's Chart, lab work & pertinent test results  Airway Mallampati: I  TM Distance: >3 FB Neck ROM: full    Dental  (+) Upper Dentures, Lower Dentures   Pulmonary former smoker,           Cardiovascular hypertension, On Medications + CAD, + Past MI and + Cardiac Stents    03/07/18: Normal NM stress echo   Neuro/Psych negative neurological ROS  negative psych ROS   GI/Hepatic negative GI ROS, Neg liver ROS,   Endo/Other  negative endocrine ROS  Renal/GU      Musculoskeletal   Abdominal   Peds  Hematology negative hematology ROS (+)   Anesthesia Other Findings Past Medical History: No date: Arthritis     Comment:  bilateral knees April 2015: Cancer [facility])     Comment:  left breast No date: Coronary artery disease No date: Hyperlipidemia No date: Hypertension 11/03/2015: Myocardial infarction [facility])     Comment:  [facility], stent placed No date: Pneumonia  Past Surgical History: 11/19/13: BREAST SURGERY; Bilateral     Comment:  bilater mastectomy  No date: CARDIAC CATHETERIZATION No date: CORONARY ANGIOPLASTY 2016: PORT-A-CATH REMOVAL 06-07-13: PORTACATH PLACEMENT 2012: SHOULDER OPEN ROTATOR CUFF REPAIR 03/28/2018: TOTAL KNEE ARTHROPLASTY; Left     Comment:  Procedure: TOTAL KNEE ARTHROPLASTY-LEFT;  Surgeon: Hessie Knows, MD;  Location: [facility] ORS;  Service: Orthopedics;               Laterality: Left; 1969: WRIST SURGERY; Left     Comment:  cyst     Reproductive/Obstetrics negative OB ROS                           Anesthesia Physical Anesthesia Plan  ASA: III  Anesthesia Plan: Spinal   Post-op Pain Management:    Induction:   PONV Risk Score and Plan: Propofol infusion  Airway Management Planned: Natural Airway and  Simple Face Mask  Additional Equipment:   Intra-op Plan:   Post-operative Plan:   Informed Consent: I have reviewed the patients History and Physical, chart, labs and discussed the procedure including the risks, benefits and alternatives for the proposed anesthesia with the patient or authorized representative who has indicated his/her understanding and acceptance.     Dental Advisory Given  Plan Discussed with: Anesthesiologist and CRNA  Anesthesia Plan Comments:        Anesthesia Quick Evaluation

## 2018-08-01 NOTE — H&P (Signed)
Reviewed paper H+P, will be scanned into chart. No changes noted.  

## 2018-08-01 NOTE — Progress Notes (Signed)
Pt BP low. Dr. Ola Spurr notified. Acknowledged. Orders received.

## 2018-08-01 NOTE — Transfer of Care (Signed)
Immediate Anesthesia Transfer of Care Note  Patient: Faith [name]  Procedure(s) Performed: RIGHT TOTAL KNEE ARTHROPLASTY (Right Knee)  Patient Location: PACU  Anesthesia Type:Spinal  Level of Consciousness: awake, alert  and oriented  Airway & Oxygen Therapy: Patient Spontanous Breathing  Post-op Assessment: Report given to RN and Post -op Vital signs reviewed and stable  Post vital signs: Reviewed and stable  Last Vitals:  Vitals Value Taken Time  BP 94/40 08/01/18 0918  Temp 35.9 C 08/01/18 0916  Pulse 65 08/01/18 0919  Resp 16 08/01/18 0919  SpO2 98 % 08/01/18 0919  Vitals shown include unvalidated device data.  Last Pain:  Vitals:   08/01/18 0916  TempSrc:   PainSc: 0-No pain         Complications: No apparent anesthesia complications

## 2018-08-01 NOTE — Progress Notes (Signed)
PHARMACIST - PHYSICIAN ORDER COMMUNICATION  CONCERNING: P&T Medication Policy on Herbal Medications  DESCRIPTION:  This patient's order for:  CoQ10 and Biotin  has been noted.  This product(s) is classified as an "herbal" or natural product. Due to a lack of definitive safety studies or FDA approval, nonstandard manufacturing practices, plus the potential risk of unknown drug-drug interactions while on inpatient medications, the Pharmacy and Therapeutics Committee does not permit the use of "herbal" or natural products of this type within [facility].   ACTION TAKEN: The pharmacy department is unable to verify this order at this time.   Please reevaluate patient's clinical condition at discharge and address if the herbal or natural product(s) should be resumed at that time.   Currie Paris, [place] 07.14.2020 [phone]

## 2018-08-01 NOTE — Progress Notes (Signed)
ADMISSION NOTE:  Pt admitted to room 154. Pt alert and oriented X4. No complaints of pain. Surgical dressing in place, wound vac and polar care in place. bone foam applied, bed in lowest position, bed alarm on, and  call bell in reach.

## 2018-08-01 NOTE — Anesthesia Procedure Notes (Signed)
Spinal  Patient location during procedure: OR Start time: 08/01/2018 7:23 AM End time: 08/01/2018 7:25 AM Staffing Anesthesiologist: Durenda Hurt, MD Performed: anesthesiologist  Preanesthetic Checklist Completed: patient identified, site marked, surgical consent, pre-op evaluation, timeout performed, IV checked, risks and benefits discussed and monitors and equipment checked Spinal Block Patient position: sitting Prep: ChloraPrep Patient monitoring: heart rate, continuous pulse ox, blood pressure and cardiac monitor Approach: midline Location: L3-4 Injection technique: single-shot Needle Needle type: Whitacre and Introducer  Needle gauge: 24 G Needle length: 9 cm Additional Notes Negative paresthesia. Negative blood return. Positive free-flowing CSF. Patient tolerated procedure well, without complications.

## 2018-08-01 NOTE — Anesthesia Post-op Follow-up Note (Signed)
Anesthesia QCDR form completed.        

## 2018-08-01 NOTE — Op Note (Signed)
08/01/2018  9:15 AM  PATIENT:  Faith [name]  73 y.o. female  PRE-OPERATIVE DIAGNOSIS:  PRIMARY OSTEOARTHRITIS OF RIGHT KNEE  POST-OPERATIVE DIAGNOSIS:  PRIMARY OSTEOARTHRITIS OF RIGHT KNEE  PROCEDURE:  Procedure(s): RIGHT TOTAL KNEE ARTHROPLASTY (Right)  SURGEON: Laurene Footman, MD  ASSISTANTS: Rachelle Hora, PA-C  ANESTHESIA:   spinal  EBL:  Total I/O In: 500 [I.V.:500] Out: 85 [Urine:75; Blood:10]   BLOOD ADMINISTERED:none  DRAINS: none   LOCAL MEDICATIONS USED:  MARCAINE    and OTHER morphine and Exparel  SPECIMEN:  No Specimen  DISPOSITION OF SPECIMEN:  N/A  COUNTS:  YES  TOURNIQUET:  62 minutes at 300 mmHg  IMPLANTS: Medacta GM K sphere system, right 4 femur, 3 TI 4 tibial component, 10 mm insert with size 2 patella, short stem for tibia, all components cemented  DICTATION: .Dragon Dictation   patient was brought to the operating room and after adequate spinal anesthesia was obtained theright leg was prepped and draped in the usual sterile fashion. After patient identification and timeout procedures were completed, midline skin incision was made followed by medial parapatellar arthrotomy after raising tourniquetthere is exposed bone throughout the entire medial femoral condyle withsevere patellofemoral andmildlateral compartment degenerative changes but no exposed bone. The ACL and fat pad were excised off the anterior into the meniscus in the proximal tibia cutting guide from the [facility] was applied proximal tibia cut carried out. The distal femoral cut was carried out in a similar fashion and the4 cutting guide applied with anterior posterior and chamfer cuts made. The posterior horns of the menisci were removed at this point. The3t/i4 tibia baseplate trial was placed pinned into position and proximal tibial preparation carried out with drilling hand reaming and the keel punch followed by placement of the 54femur and sizing the tibial insert  size 10gave the best fit with stability and full extension. The distal femoral drill holes were made in the notch cut for the trochlear groove was then carried out with trials were then removed the patella was cut using the patellar cutting guide and it sized to a size2, afterdrill holes have been made.   At this point the above local was infiltrated in the periarticular tissues and periosteum. The knee was irrigated with pulsatile lavage and the bony surfaces dried the tibial component was cemented into place first. Excess cement was removed and the polyethylene insert placed with a torque screw placed with a torque screwdriver tightened. The distal femoral component was placed and the knee was held in extension as the patellar button was clamped into place. After the cement was set excess cement was removed and the knee was again irrigated thoroughly thoroughly irrigated. The tourniquet was let down and hemostasis checked electrocautery. The arthrotomy was repaired with a heavy Quill suture, #2 Ethibond followed by 3-0 V lock subcuticular closure,skin staples, incisional wound VAC secondary to the extensive edema present at the start of the casel and Ace wrapand Polar Care  PLAN OF CARE: Admit to inpatient   PATIENT DISPOSITION:  PACU - hemodynamically stable.

## 2018-08-02 LAB — CBC
HCT: 35.5 % — ABNORMAL LOW (ref 36.0–46.0)
Hemoglobin: 11.2 g/dL — ABNORMAL LOW (ref 12.0–15.0)
MCH: 28.9 pg (ref 26.0–34.0)
MCHC: 31.5 g/dL (ref 30.0–36.0)
MCV: 91.5 fL (ref 80.0–100.0)
Platelets: 191 10*3/uL (ref 150–400)
RBC: 3.88 MIL/uL (ref 3.87–5.11)
RDW: 13.7 % (ref 11.5–15.5)
WBC: 11.5 10*3/uL — ABNORMAL HIGH (ref 4.0–10.5)
nRBC: 0 % (ref 0.0–0.2)

## 2018-08-02 LAB — BASIC METABOLIC PANEL
Anion gap: 5 (ref 5–15)
BUN: 19 mg/dL (ref 8–23)
CO2: 25 mmol/L (ref 22–32)
Calcium: 8.7 mg/dL — ABNORMAL LOW (ref 8.9–10.3)
Chloride: 107 mmol/L (ref 98–111)
Creatinine, Ser: 0.76 mg/dL (ref 0.44–1.00)
GFR calc Af Amer: >60 mL/min (ref >60)
GFR calc non Af Amer: >60 mL/min (ref >60)
Glucose, Bld: 165 mg/dL — ABNORMAL HIGH (ref 70–99)
Potassium: 4.3 mmol/L (ref 3.5–5.1)
Sodium: 137 mmol/L (ref 135–145)

## 2018-08-02 NOTE — Progress Notes (Signed)
Physical Therapy Treatment Patient Details Name: Faith [name] MRN: [id number] DOB: Jun 28, 1945 Today's Date: 08/02/2018    History of Present Illness Pt is a 73 y.o. 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  PMH includes L TKA 03/28/18, htn, CAD, h/o MI, cardiac stents, L breast CA, and B knee arthritis.    PT Comments    Pt able to progress to ambulating 20 feet and then 30 feet with RW CGA but limited distance ambulating d/t fatigue.  Vc's required for R knee extension during R LE stance phase and to increase UE support through RW to off-weight R LE.  Pt had significant difficulty standing from elevated bed surface (requiring 2 assist) but able to stand from recliner with min assist and able to stand from [facility] without physical assist.  Pain 4/10 R knee beginning of session and 2/10 end of session at rest.   Will continue to progress pt with strengthening, knee ROM, and progressive ambulation distance per pt tolerance next session.    Follow Up Recommendations  Outpatient PT     Equipment Recommendations  Rolling walker with 5" wheels;3in1 (PT)    Recommendations for Other Services       Precautions / Restrictions Precautions Precautions: Knee;Fall Precaution Booklet Issued: Yes (comment) Precaution Comments: wound vac Restrictions Weight Bearing Restrictions: Yes RLE Weight Bearing: Weight bearing as tolerated    Mobility  Bed Mobility Overal bed mobility: Needs Assistance Bed Mobility: Supine to Sit;Sit to Supine     Supine to sit: Min assist Sit to supine: Min assist   General bed mobility comments: assist for R LE; increased effort/time to perform  Transfers Overall transfer level: Needs assistance Equipment used: Rolling walker (2 wheeled) Transfers: Sit to/from [facility] Sit to Stand: Min assist;Mod assist;+2 physical assistance;From elevated surface Stand pivot transfers: Min guard(stand step turn to [facility] over toilet and recliner to bed with RW)        General transfer comment: min to mod assist x2 (with L knee blocked) to stand from elevated bed with vc's for UE/LE positioning (increased effort and time to perform with multiple repetitions to try to stand); pt able to stand from [facility] over toilet on own; min assist to stand from recliner chair; vc's for UE/LE placement  Ambulation/Gait Ambulation/Gait assistance: Min guard Gait Distance (Feet): (20 feet to bathroom; 30 feet) Assistive device: Rolling walker (2 wheeled) Gait Pattern/deviations: Step-to pattern;Antalgic Gait velocity: decreased   General Gait Details: decreased stance time R LE; mild R knee flexion noted during R LE stance time requiring vc's for knee extension/quad set; steady with RW   Stairs             Wheelchair Mobility    Modified Rankin (Stroke Patients Only)       Balance Overall balance assessment: Needs assistance Sitting-balance support: No upper extremity supported;Feet supported Sitting balance-Leahy Scale: Good Sitting balance - Comments: steady sitting reaching within BOS   Standing balance support: Single extremity supported Standing balance-Leahy Scale: Poor Standing balance comment: pt requiring at least single UE support for static standing balance                            Cognition Arousal/Alertness: Awake/alert Behavior During Therapy: WFL for tasks assessed/performed Overall Cognitive Status: Within Functional Limits for tasks assessed  Exercises      General Comments General comments (skin integrity, edema, etc.): R knee wound vac and dressings in place.  Pt agreeable to PT session.      Pertinent Vitals/Pain Pain Assessment: 0-10 Pain Score: 2  Pain Location: R knee Pain Descriptors / Indicators: Sore Pain Intervention(s): Limited activity within patient's tolerance;Monitored during session;Premedicated before session;Repositioned;Other  (comment)(polar care applied and activated)  Vitals (HR and O2 on room air) stable and WFL throughout treatment session.    Home Living                      Prior Function            PT Goals (current goals can now be found in the care plan section) Acute Rehab PT Goals Patient Stated Goal: to go home and go to OP PT PT Goal Formulation: With patient Time For Goal Achievement: 08/15/18 Potential to Achieve Goals: Good Progress towards PT goals: Progressing toward goals    Frequency    BID      PT Plan Current plan remains appropriate    Co-evaluation              AM-PAC PT "6 Clicks" Mobility   Outcome Measure  Help needed turning from your back to your side while in a flat bed without using bedrails?: A Little Help needed moving from lying on your back to sitting on the side of a flat bed without using bedrails?: A Little Help needed moving to and from a bed to a chair (including a wheelchair)?: A Little Help needed standing up from a chair using your arms (e.g., wheelchair or bedside chair)?: A Little Help needed to walk in hospital room?: A Little Help needed climbing 3-5 steps with a railing? : A Little 6 Click Score: 18    End of Session Equipment Utilized During Treatment: Gait belt Activity Tolerance: Patient limited by fatigue Patient left: in bed;with call bell/phone within reach;with bed alarm set;with SCD's reapplied(B heels elevated via towel roll and to promote knee extension; polar care in place and activated) Nurse Communication: Mobility status;Precautions;Weight bearing status PT Visit Diagnosis: Other abnormalities of gait and mobility (R26.89);Muscle weakness (generalized) (M62.81);Difficulty in walking, not elsewhere classified (R26.2);Pain Pain - Right/Left: Right Pain - part of body: Knee     Time: 5974-1638 PT Time Calculation (min) (ACUTE ONLY): 30 min  Charges:  $Gait Training: 8-22 mins $Therapeutic Activity: 8-22  mins                    Leitha Bleak, PT 08/02/18, 4:50 PM [phone]

## 2018-08-02 NOTE — Plan of Care (Signed)
  Problem: Activity: Goal: Ability to avoid complications of mobility impairment will improve Outcome: Progressing Goal: Range of joint motion will improve Outcome: Progressing   Problem: Pain Management: Goal: Pain level will decrease with appropriate interventions Outcome: Progressing   

## 2018-08-02 NOTE — Anesthesia Postprocedure Evaluation (Signed)
Anesthesia Post Note  Patient: Faith [name]  Procedure(s) Performed: RIGHT TOTAL KNEE ARTHROPLASTY (Right Knee)  Patient location during evaluation: Nursing Unit Anesthesia Type: Spinal Level of consciousness: awake and alert and oriented Pain management: pain level controlled Vital Signs Assessment: post-procedure vital signs reviewed and stable Respiratory status: spontaneous breathing and nonlabored ventilation Cardiovascular status: stable Postop Assessment: no headache, no backache, patient able to bend at knees, no apparent nausea or vomiting, able to ambulate and adequate PO intake Anesthetic complications: no     Last Vitals:  Vitals:   08/02/18 0002 08/02/18 0727  BP: (!) 144/57 (!) 144/59  Pulse: 71 (!) 58  Resp: 16 16  Temp: 36.6 C 36.8 C  SpO2: 98% 96%    Last Pain:  Vitals:   08/02/18 0857  TempSrc:   PainSc: 8                  Lanora Manis

## 2018-08-02 NOTE — Progress Notes (Signed)
   Subjective: 1 Day Post-Op Procedure(s) (LRB): RIGHT TOTAL KNEE ARTHROPLASTY (Right) Patient reports pain as 5 on 0-10 scale.  Pain much improved from last night. Patient is well, and has had no acute complaints or problems Denies any CP, SOB, ABD pain. We will continue therapy today.  Plan is to go Home after hospital stay.  Objective: Vital signs in last 24 hours: Temp:  [96.7 F (35.9 C)-98.2 F (36.8 C)] 98.2 F (36.8 C) (07/15 0727) Pulse Rate:  [51-73] 58 (07/15 0727) Resp:  [11-21] 16 (07/15 0727) BP: (94-144)/(40-87) 144/59 (07/15 0727) SpO2:  [96 %-100 %] 96 % (07/15 0727)  Intake/Output from previous day: 07/14 0701 - 07/15 0700 In: 2508.3 [P.O.:240; I.V.:2168.3; IV Piggyback:100] Out: 885 [Urine:875; Blood:10] Intake/Output this shift: No intake/output data recorded.  Recent Labs    08/01/18 1136 08/02/18 0442  HGB 11.7* 11.2*   Recent Labs    08/01/18 1136 08/02/18 0442  WBC 9.1 11.5*  RBC 4.04 3.88  HCT 37.3 35.5*  PLT 204 191   Recent Labs    08/01/18 1136 08/02/18 0442  NA  --  137  K  --  4.3  CL  --  107  CO2  --  25  BUN  --  19  CREATININE 0.92 0.76  GLUCOSE  --  165*  CALCIUM  --  8.7*   No results for input(s): LABPT, INR in the last 72 hours.  EXAM General - Patient is Alert, Appropriate and Oriented Extremity - Neurovascular intact Sensation intact distally Intact pulses distally Dorsiflexion/Plantar flexion intact No cellulitis present Compartment soft Dressing - dressing C/D/I and no drainage, Praveena intact without drainage Motor Function - intact, moving foot and toes well on exam.   Past Medical History:  Diagnosis Date  . Arthritis    bilateral knees  . Cancer [facility]) April 2015   left breast  . Coronary artery disease   . Hyperlipidemia   . Hypertension   . Myocardial infarction ([place]) 11/03/2015   [facility], stent placed  . Pneumonia     Assessment/Plan:   1 Day Post-Op Procedure(s) (LRB): RIGHT TOTAL KNEE  ARTHROPLASTY (Right) Active Problems:   Status post total knee replacement using cement, right  Estimated body mass index is 35.73 kg/m as calculated from the following:   Height as of 07/20/18: 5\' 7"  (1.702 m).   Weight as of 07/20/18: 103.5 kg. Advance diet Up with therapy  Needs bowel movement Labs stable Vital signs stable Care management to assist with discharge to home with home health  DVT Prophylaxis - Lovenox, TED hose and SCDs Weight-Bearing as tolerated to right   T. Rachelle Hora, PA-C [place] 08/02/2018, 8:18 AM

## 2018-08-02 NOTE — TOC Initial Note (Signed)
Transition of Care [facility]) - Initial/Assessment Note    Patient Details  Name: Faith [name] MRN: [id number] Date of Birth: Jun 16, 1945  Transition of Care [facility]) CM/SW Contact:    Su Hilt, RN Phone Number: 08/02/2018, 12:12 PM  Clinical Narrative:                 Met with the patient to discuss DC plan and needs She lives at home with her husband and he provides transportation She has DME at home including RW, Cane, Raised toilet seat and BSC She is already set up for Outpatient PT and has appointment Fri at 11 AM She is aware of the Lovenox price and is able to give them to herself She has no other needs at this time  Expected Discharge Plan: [place] Barriers to Discharge: Barriers Resolved   Patient Goals and CMS Choice Patient states their goals for this hospitalization and ongoing recovery are:: go home      Expected Discharge Plan and Services Expected Discharge Plan: [place]   Discharge Planning Services: CM Consult   Living arrangements for the past 2 months: Single Family Home                 DME Arranged: N/A         HH Arranged: NA          Prior Living Arrangements/Services Living arrangements for the past 2 months: Single Family Home Lives with:: Spouse Patient language and need for interpreter reviewed:: No Do you feel safe going back to the place where you live?: Yes      Need for Family Participation in Patient Care: No (Comment) Care giver support system in place?: Yes (comment) Current home services: DME(RW, BSC, Cane, raised toilet seat) Criminal Activity/Legal Involvement Pertinent to Current Situation/Hospitalization: No - Comment as needed  Activities of Daily Living Home Assistive Devices/Equipment: Eyeglasses, Dentures (specify type) ADL Screening (condition at time of admission) Patient's cognitive ability adequate to safely complete daily activities?: Yes Is the patient deaf or have  difficulty hearing?: No Does the patient have difficulty seeing, even when wearing glasses/contacts?: No Does the patient have difficulty concentrating, remembering, or making decisions?: No Patient able to express need for assistance with ADLs?: Yes Does the patient have difficulty dressing or bathing?: No Independently performs ADLs?: Yes (appropriate for developmental age) Does the patient have difficulty walking or climbing stairs?: No Weakness of Legs: None Weakness of Arms/Hands: None  Permission Sought/Granted                  Emotional Assessment Appearance:: Appears stated age Attitude/Demeanor/Rapport: Engaged Affect (typically observed): Accepting, Happy Orientation: : Oriented to Self, Oriented to Place, Oriented to  Time, Oriented to Situation Alcohol / Substance Use: Not Applicable Psych Involvement: No (comment)  Admission diagnosis:  PRIMARY OSTEOARTHRITIS OF RIGHT KNEE Patient Active Problem List   Diagnosis Date Noted  . Status post total knee replacement using cement, right 08/01/2018  . Goals of care, counseling/discussion 06/30/2018  . Status post total knee replacement using cement, left 03/28/2018  . 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 female, estrogen receptor positive ([place]) 09/09/2015   PCP:  Patient, No Pcp Per Pharmacy:   [id number] [id number]: [phone]: [phone]    Social Determinants of Health (SDOH) Interventions    Readmission Risk Interventions No flowsheet  data found.

## 2018-08-02 NOTE — Progress Notes (Signed)
Physical Therapy Treatment Patient Details Name: Faith [name] MRN: [id number] DOB: 1945-05-24 Today's Date: 08/02/2018    History of Present Illness Pt is a 73 y.o. 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  PMH includes L TKA 03/28/18, htn, CAD, h/o MI, cardiac stents, L breast CA, and B knee arthritis.    PT Comments    Pt received in semi-supine in bed and agreeable to PT. Pt states lack of sleeping d/t pain last night and tiredness at the beginning of the session. Min A for lifting R LE sitting OOB and able to scoot toward EOB with minimal vc's. Vc's for hand placement and RW management with transfer standing from bed with +1 CGA. Pt states R knee pain and feeling to buckle due to general weakness related to lack of sleep. Pt then transferred sitting on recliner and ambulation hold to later session due to pt fatigue and safety. Min A with exercise in bed and sitting on the chair. Pt is 6 degrees lack to R knee ext neutral, and 88 degrees with R knee flexion in sitting. Will progress to ambulation in later session to improve ADLs and functional mobility.   Follow Up Recommendations  Outpatient PT     Equipment Recommendations  Rolling walker with 5" wheels;3in1 (PT)    Recommendations for Other Services       Precautions / Restrictions Precautions Precautions: Knee;Fall Precaution Booklet Issued: Yes (comment) Precaution Comments: wound vac Restrictions Weight Bearing Restrictions: Yes RLE Weight Bearing: Weight bearing as tolerated    Mobility  Bed Mobility Overal bed mobility: Needs Assistance Bed Mobility: Supine to Sit     Supine to sit: Min assist;HOB elevated     General bed mobility comments: assist for R LE; increased effort/time to perform  Transfers Overall transfer level: Needs assistance Equipment used: Rolling walker (2 wheeled) Transfers: Sit to/from Stand Sit to Stand: Min guard;Min assist         General transfer comment: minimal assist to initiate  and come to full stand; vc's for UE/LE placement; multiple attempts transfer from sitting to standing due to weakness and pain  Ambulation/Gait Ambulation/Gait assistance: Min guard Gait Distance (Feet): 3 Feet Assistive device: Rolling walker (2 wheeled) Gait Pattern/deviations: Step-to pattern;Antalgic Gait velocity: decreased   General Gait Details: decreased stance time R LE; steady with RW   Stairs             Wheelchair Mobility    Modified Rankin (Stroke Patients Only)       Balance Overall balance assessment: Needs assistance Sitting-balance support: No upper extremity supported;Feet supported Sitting balance-Leahy Scale: Good Sitting balance - Comments: steady sitting reaching within BOS   Standing balance support: Single extremity supported Standing balance-Leahy Scale: Fair Standing balance comment: pt requiring at least single UE support for static standing balance                            Cognition Arousal/Alertness: Awake/alert Behavior During Therapy: WFL for tasks assessed/performed Overall Cognitive Status: Within Functional Limits for tasks assessed                                        Exercises Total Joint Exercises Short Arc Quad: AAROM;Strengthening;Right;10 reps;Supine Goniometric ROM: R knee extension 6 degrees short of neutral semi-supine in bed; R knee flexion AROM 88 degrees sitting edge of  bed General Exercises - Lower Extremity Hip Flexion/Marching: AAROM;Strengthening;Right;10 reps;Seated    General Comments General comments (skin integrity, edema, etc.): R knee wound vac and dressings in place      Pertinent Vitals/Pain Pain Assessment: 0-10 Pain Score: 5  Pain Location: R knee Pain Descriptors / Indicators: Aching;Sore;Cramping Pain Intervention(s): Limited activity within patient's tolerance;Monitored during session;Repositioned;Other (comment)(Polar care activated before and at end session)     [place] expects to be discharged to:: Private residence Living Arrangements: Spouse/significant other                  Prior Function            PT Goals (current goals can now be found in the care plan section) Acute Rehab PT Goals Patient Stated Goal: to go home and go to OP PT PT Goal Formulation: With patient Time For Goal Achievement: 08/15/18 Potential to Achieve Goals: Good Progress towards PT goals: Progressing toward goals    Frequency    BID      PT Plan Current plan remains appropriate    Co-evaluation              AM-PAC PT "6 Clicks" Mobility   Outcome Measure  Help needed turning from your back to your side while in a flat bed without using bedrails?: A Little Help needed moving from lying on your back to sitting on the side of a flat bed without using bedrails?: A Little Help needed moving to and from a bed to a chair (including a wheelchair)?: A Little Help needed standing up from a chair using your arms (e.g., wheelchair or bedside chair)?: A Little Help needed to walk in hospital room?: A Little Help needed climbing 3-5 steps with a railing? : A Little 6 Click Score: 18    End of Session Equipment Utilized During Treatment: Gait belt Activity Tolerance: Patient limited by pain;Patient limited by fatigue Patient left: in chair;with call bell/phone within reach;with chair alarm set;with SCD's reapplied(Pt refused bone foam d/t pain; folded towel provided to elevate and offload heel on R to promote knee extension. L heel floating with pollow) Nurse Communication: Mobility status;Precautions;Weight bearing status;Other (comment) PT Visit Diagnosis: Other abnormalities of gait and mobility (R26.89);Muscle weakness (generalized) (M62.81);Difficulty in walking, not elsewhere classified (R26.2);Pain Pain - Right/Left: Right Pain - part of body: Knee     Time: 1022-1119 PT Time Calculation (min) (ACUTE ONLY): 57  min  Charges:                        Sherrilyn Rist, SPT 08/02/2018, 11:44 AM

## 2018-08-03 MED ORDER — BISACODYL 10 MG RE SUPP
10.0000 mg | Freq: Once | RECTAL | Status: AC
  Administered 2018-08-03: 09:00:00 10 mg via RECTAL
  Filled 2018-08-03: qty 1

## 2018-08-03 MED ORDER — DOCUSATE SODIUM 100 MG PO CAPS: 100.0000 mg | ORAL_CAPSULE | Freq: Two times a day (BID) | ORAL | 0 refills | Status: DC

## 2018-08-03 MED ORDER — OXYCODONE HCL 5 MG PO TABS: 5.0000 mg | ORAL_TABLET | ORAL | 0 refills | Status: DC | PRN

## 2018-08-03 MED ORDER — ENOXAPARIN SODIUM 40 MG/0.4ML ~~LOC~~ SOLN: 40.0000 mg | SUBCUTANEOUS | 0 refills | Status: DC

## 2018-08-03 NOTE — Discharge Summary (Signed)
Physician Discharge Summary  Patient ID: Faith [name] MRN: [id number] DOB/AGE: [birth year] 73 y.o.  Admit date: 08/01/2018 Discharge date: 08/04/2018 Admission Diagnoses:  PRIMARY OSTEOARTHRITIS OF RIGHT KNEE   Discharge Diagnoses: Patient Active Problem List   Diagnosis Date Noted  . Status post total knee replacement using cement, right 08/01/2018  . Goals of care, counseling/discussion 06/30/2018  . Status post total knee replacement using cement, left 03/28/2018  . 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 female, estrogen receptor positive ([place]) 09/09/2015    Past Medical History:  Diagnosis Date  . Arthritis    bilateral knees  . [facility]) April 2015   left breast  . Coronary artery disease   . Hyperlipidemia   . Hypertension   . Myocardial infarction ([place]) 11/03/2015   [facility], stent placed  . Pneumonia      Transfusion: none   Consultants (if any):   Discharged Condition: Improved  Hospital Course: Faith [name] is an 73 y.o. female who was admitted 08/01/2018 with a diagnosis of knee osteoarthritis and went to the operating room on 08/01/2018 and underwent the above named procedures.    Surgeries: Procedure(s): RIGHT TOTAL KNEE ARTHROPLASTY on 08/01/2018 Patient tolerated the surgery well. Taken to PACU where she was stabilized and then transferred to the orthopedic floor.  Started on Lovenox 30 mg  q 12 hrs. Foot pumps applied bilaterally at 80 mm. Heels elevated on bed with rolled towels. No evidence of DVT. Negative [place]. Physical therapy started on day #1 for gait training and transfer. OT started day #1 for ADL and assisted devices.  Patient's foley was d/c on day #1. Patient's IV was d/c on day #2.  On post op day #3 patient was stable and ready for discharge to home with HHPT.  Implants: [place] K sphere system, right 4 femur, 3 TI 4 tibial component, 10 mm insert with size 2 patella, short stem for tibia, all components cemented  She was  given perioperative antibiotics:  Anti-infectives (From admission, onward)   Start     Dose/Rate Route Frequency Ordered Stop   08/01/18 1330  ceFAZolin (ANCEF) IVPB 2g/100 mL premix     2 g 200 mL/hr over 30 Minutes Intravenous Every 6 hours 08/01/18 1110 08/02/18 0024   08/01/18 0614  ceFAZolin (ANCEF) 2-4 GM/100ML-% IVPB    Note to Pharmacy: [place]  : cabinet override      08/01/18 0614 08/01/18 0732   07/31/18 2145  ceFAZolin (ANCEF) IVPB 2g/100 mL premix     2 g 200 mL/hr over 30 Minutes Intravenous  Once 07/31/18 2141 08/01/18 0802    .  She was given sequential compression devices, early ambulation, and Lovenox TEDs for DVT prophylaxis.  She benefited maximally from the hospital stay and there were no complications.    Recent vital signs:  Vitals:   08/02/18 2321 08/03/18 0731  BP: (!) 147/55 (!) 144/57  Pulse: 64 72  Resp: 17 17  Temp: 98.9 F (37.2 C) 98.3 F (36.8 C)  SpO2: 96% 98%    Recent laboratory studies:  Lab Results  Component Value Date   HGB 11.2 (L) 08/02/2018   HGB 11.7 (L) 08/01/2018   HGB 12.6 07/20/2018   Lab Results  Component Value Date   WBC 11.5 (H) 08/02/2018   PLT 191 08/02/2018   Lab Results  Component Value Date   INR 1.0 07/20/2018   Lab Results  Component Value Date   NA 137 08/02/2018  K 4.3 08/02/2018   CL 107 08/02/2018   CO2 25 08/02/2018   BUN 19 08/02/2018   CREATININE 0.76 08/02/2018   GLUCOSE 165 (H) 08/02/2018    Discharge Medications:   Allergies as of 08/03/2018      Reactions   Mushroom Extract Complex Hives, Swelling   Nickel Rash   Shellfish Allergy Nausea And Vomiting      Medication List    TAKE these medications   aspirin EC 81 MG tablet Take 81 mg by mouth daily.   atorvastatin 80 MG tablet Commonly known as: LIPITOR Take 80 mg by mouth daily at 6 PM.   Biotin 5 MG Caps Take 5 mg by mouth 2 (two) times a day.   CALCIUM 600+D3 PO Take 1 tablet by mouth 2 (two) times a day.    cetirizine 10 MG tablet Commonly known as: ZYRTEC Take 10 mg by mouth daily.   CoQ10 100 MG Caps Take 100 mg by mouth daily.   docusate sodium 100 MG capsule Commonly known as: COLACE Take 1 capsule (100 mg total) by mouth 2 (two) times daily.   enoxaparin 40 MG/0.4ML injection Commonly known as: Lovenox Inject 0.4 mLs (40 mg total) into the skin daily for 14 days.   losartan 50 MG tablet Commonly known as: COZAAR Take 50 mg by mouth daily.   metoprolol tartrate 25 MG tablet Commonly known as: LOPRESSOR Take 25 mg by mouth 2 (two) times daily.   nitroGLYCERIN 0.4 MG SL tablet Commonly known as: NITROSTAT Place 0.4 mg under the tongue every 5 (five) minutes as needed for chest pain.   oxyCODONE 5 MG immediate release tablet Commonly known as: Oxy IR/ROXICODONE Take 1-2 tablets (5-10 mg total) by mouth every 4 (four) hours as needed for moderate pain (pain score 4-6).   Refresh Optive 1-0.9 % Gel Generic drug: Carboxymethylcellul-Glycerin Place 1 drop into both eyes at bedtime.   vitamin C 1000 MG tablet Take 1,000 mg by mouth daily.   Vitamin D3 50 MCG (2000 UT) Tabs Take 2,000 Units by mouth 2 (two) times a day.       Diagnostic Studies: Dg Knee 1-2 Views Right  Result Date: 08/01/2018 CLINICAL DATA:  Status post right total knee replacement. EXAM: RIGHT KNEE - 1-2 VIEW COMPARISON:  None. FINDINGS: The right femoral and tibial components appear to be well situated. Expected postoperative changes seen in the soft tissues anteriorly. No acute bony abnormality is noted. IMPRESSION: Status post right total knee arthroplasty. Electronically Signed   By: Marijo Conception M.D.   On: 08/01/2018 09:39    Disposition:     Follow-up Information    Faith Guess, PA-C Follow up in 2 week(s).   Specialties: Orthopedic Surgery, Emergency Medicine Contact information: [place] [address] [phone]            Signed: Feliberto Gottron 08/03/2018, 11:45 AM

## 2018-08-03 NOTE — Progress Notes (Signed)
   Subjective: 2 Days Post-Op Procedure(s) (LRB): RIGHT TOTAL KNEE ARTHROPLASTY (Right) Patient reports pain as 5 on 0-10 scale.  Overall pain improving Patient is well, and has had no acute complaints or problems Denies any CP, SOB, ABD pain. We will continue therapy today.  Plan is to go Home after hospital stay.  Objective: Vital signs in last 24 hours: Temp:  [98.2 F (36.8 C)-98.9 F (37.2 C)] 98.9 F (37.2 C) (07/15 2321) Pulse Rate:  [58-76] 64 (07/15 2321) Resp:  [16-17] 17 (07/15 2321) BP: (144-158)/(55-59) 147/55 (07/15 2321) SpO2:  [94 %-96 %] 96 % (07/15 2321) Weight:  [103.4 kg] 103.4 kg (07/15 1021)  Intake/Output from previous day: 07/15 0701 - 07/16 0700 In: 840 [P.O.:840] Out: 600 [Urine:600] Intake/Output this shift: No intake/output data recorded.  Recent Labs    08/01/18 1136 08/02/18 0442  HGB 11.7* 11.2*   Recent Labs    08/01/18 1136 08/02/18 0442  WBC 9.1 11.5*  RBC 4.04 3.88  HCT 37.3 35.5*  PLT 204 191   Recent Labs    08/01/18 1136 08/02/18 0442  NA  --  137  K  --  4.3  CL  --  107  CO2  --  25  BUN  --  19  CREATININE 0.92 0.76  GLUCOSE  --  165*  CALCIUM  --  8.7*   No results for input(s): LABPT, INR in the last 72 hours.  EXAM General - Patient is Alert, Appropriate and Oriented Extremity - Neurovascular intact Sensation intact distally Intact pulses distally Dorsiflexion/Plantar flexion intact No cellulitis present Compartment soft Dressing - dressing C/D/I and no drainage, Praveena intact without drainage Motor Function - intact, moving foot and toes well on exam.   Past Medical History:  Diagnosis Date  . Arthritis    bilateral knees  . Cancer [facility]) April 2015   left breast  . Coronary artery disease   . Hyperlipidemia   . Hypertension   . Myocardial infarction ([place]) 11/03/2015   [facility], stent placed  . Pneumonia     Assessment/Plan:   2 Days Post-Op Procedure(s) (LRB): RIGHT TOTAL KNEE ARTHROPLASTY  (Right) Active Problems:   Status post total knee replacement using cement, right  Estimated body mass index is 35.7 kg/m as calculated from the following:   Height as of this encounter: 5\' 7"  (1.702 m).   Weight as of this encounter: 103.4 kg. Advance diet Up with therapy  Needs bowel movement Labs stable Vital signs stable Care management to assist with discharge to home with home health  DVT Prophylaxis - Lovenox, TED hose and SCDs Weight-Bearing as tolerated to right   T. Rachelle Hora, PA-C [place] 08/03/2018, 7:22 AM

## 2018-08-03 NOTE — Progress Notes (Signed)
Physical Therapy Treatment Patient Details Name: Faith [name] MRN: [id number] DOB: July 11, 1945 Today's Date: 08/03/2018    History of Present Illness Pt is a 73 y.o. 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  PMH includes L TKA 03/28/18, htn, CAD, h/o MI, cardiac stents, L breast CA, and B knee arthritis.    PT Comments    Pt received from sitting on recliner and agreeable to PT session. Pt tolerates the session well. +1 Min A  transfer standing from recliner; +1 CGA ambulate 160 feet with fatigue at the end 30 feet. Vc's given for RW management and step sequencing with R LE stepping forward followed by L LE for energy conservation and increase ambulation tolerance. Step through gait changed to step to gait pattern noted at the end of the session d/t fatigue. Pt states OPPT appointment rescheduled to next Wednesday. Will continue to progress pt with strengthening, knee ROM, and progressive ambulation distance per pt tolerance next session.    Follow Up Recommendations  Outpatient PT     Equipment Recommendations  Rolling walker with 5" wheels;3in1 (PT)    Recommendations for Other Services       Precautions / Restrictions Precautions Precautions: Knee;Fall Precaution Booklet Issued: Yes (comment) Precaution Comments: wound vac Restrictions Weight Bearing Restrictions: Yes RLE Weight Bearing: Weight bearing as tolerated    Mobility  Bed Mobility Overal bed mobility: Needs Assistance Bed Mobility: Sit to Supine       Sit to supine: Min assist   General bed mobility comments: assist for R LE; increased effort/time to perform  Transfers Overall transfer level: Needs assistance Equipment used: Rolling walker (2 wheeled) Transfers: Sit to/from Stand Sit to Stand: Min assist         General transfer comment: +1 Min assist to stand from recliner with vc's for UE/LE positioning (increased effort and time)  Ambulation/Gait Ambulation/Gait assistance: Min guard Gait Distance  (Feet): 160 Feet Assistive device: Rolling walker (2 wheeled) Gait Pattern/deviations: Step-through pattern;Step-to pattern Gait velocity: Decreased towards the end of the ambulation distances d/t fatigue   General Gait Details: Vc's provided for RW management for energy conservation; vc's for LE stepping sequencing with R LE step forward followed by L LE to improve ambulation tolerance; steady with RW.   Stairs             Wheelchair Mobility    Modified Rankin (Stroke Patients Only)       Balance Overall balance assessment: Needs assistance Sitting-balance support: No upper extremity supported;Feet supported Sitting balance-Leahy Scale: Good Sitting balance - Comments: steady sitting reaching within BOS   Standing balance support: No upper extremity supported Standing balance-Leahy Scale: Good Standing balance comment: steady standing reaching within BOS                            Cognition Arousal/Alertness: Awake/alert Behavior During Therapy: WFL for tasks assessed/performed Overall Cognitive Status: Within Functional Limits for tasks assessed                                        Exercises Total Joint Exercises Ankle Circles/Pumps: AROM;Strengthening;Both;10 reps;Supine Straight Leg Raises: AAROM;Strengthening;Right;10 reps;Supine Goniometric ROM: R knee extension 5 degrees short of neutral semi-supine in bed; R knee flexion AROM 91 degrees sitting edge of bed    General Comments General comments (skin integrity, edema, etc.): R knee  wound vac and dressings in place      Pertinent Vitals/Pain Pain Score: 3  Pain Location: R knee Pain Descriptors / Indicators: Sore Pain Intervention(s): Limited activity within patient's tolerance;Monitored during session;Patient requesting pain meds-RN notified;Other (comment);Repositioned(polar care applied and activated)  Vitals stable and WFL throughout treatment session.    Home Living                       Prior Function            PT Goals (current goals can now be found in the care plan section) Acute Rehab PT Goals Patient Stated Goal: to go home and go to OP PT PT Goal Formulation: With patient Time For Goal Achievement: 08/15/18 Potential to Achieve Goals: Good Progress towards PT goals: Progressing toward goals    Frequency    BID      PT Plan Current plan remains appropriate    Co-evaluation              AM-PAC PT "6 Clicks" Mobility   Outcome Measure  Help needed turning from your back to your side while in a flat bed without using bedrails?: A Little Help needed moving from lying on your back to sitting on the side of a flat bed without using bedrails?: A Little Help needed moving to and from a bed to a chair (including a wheelchair)?: A Little Help needed standing up from a chair using your arms (e.g., wheelchair or bedside chair)?: A Little Help needed to walk in hospital room?: A Little Help needed climbing 3-5 steps with a railing? : A Little 6 Click Score: 18    End of Session Equipment Utilized During Treatment: Gait belt Activity Tolerance: Patient limited by fatigue Patient left: in bed;with call bell/phone within reach;with bed alarm set;with SCD's reapplied(B heels elevated via towel roll and to promote knee extension; polar care in place and activated) Nurse Communication: Mobility status;Precautions;Weight bearing status PT Visit Diagnosis: Other abnormalities of gait and mobility (R26.89);Muscle weakness (generalized) (M62.81);Difficulty in walking, not elsewhere classified (R26.2);Pain Pain - Right/Left: Right Pain - part of body: Knee     Time: 7416-3845 PT Time Calculation (min) (ACUTE ONLY): 41 min  Charges:                          Sherrilyn Rist, SPT 08/03/2018, 11:27 AM

## 2018-08-03 NOTE — Discharge Instructions (Signed)

## 2018-08-03 NOTE — Progress Notes (Signed)
Physical Therapy Treatment Patient Details Name: Faith [name] MRN: [id number] DOB: Jan 01, 1946 Today's Date: 08/03/2018    History of Present Illness Pt is a 73 y.o. 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  PMH includes L TKA 03/28/18, htn, CAD, h/o MI, cardiac stents, L breast CA, and B knee arthritis.    PT Comments    Pt able to progress to ambulating 120 feet and also 160 feet with RW during session (extended rest break during session d/t hospital facility alert).  Able to navigate platform step x2 trials with RW and CGA (2nd assist CGA provided but not required).  Minimal R knee pain during session.  Pt does require min assist for R LE in/out of bed.  Will continue to focus on strengthening, knee ROM, and progressive functional mobility during hospital stay.    Follow Up Recommendations  Outpatient PT     Equipment Recommendations  Rolling walker with 5" wheels;3in1 (PT)    Recommendations for Other Services       Precautions / Restrictions Precautions Precautions: Knee;Fall Precaution Booklet Issued: Yes (comment) Precaution Comments: wound vac Restrictions Weight Bearing Restrictions: Yes RLE Weight Bearing: Weight bearing as tolerated    Mobility  Bed Mobility Overal bed mobility: Needs Assistance Bed Mobility: Supine to Sit;Sit to Supine     Supine to sit: Min assist Sit to supine: Min assist   General bed mobility comments: assist for R LE; increased effort/time to perform  Transfers Overall transfer level: Needs assistance Equipment used: Rolling walker (2 wheeled) Transfers: Sit to/from Stand Sit to Stand: Min guard Stand pivot transfers: Min guard(stand step turn to [facility] over toilet)       General transfer comment: CGA to stand from mildly elevated bed and from recliner  Ambulation/Gait Ambulation/Gait assistance: Min guard Gait Distance (Feet): (120 feet; 160 feet) Assistive device: Rolling walker (2 wheeled)   Gait velocity: decreased   General  Gait Details: partial step through gait pattern; decreased stance time R LE; mild R knee flexion during R LE stance phase but no knee buckling noted; steady with RW   Stairs             Wheelchair Mobility    Modified Rankin (Stroke Patients Only)       Balance Overall balance assessment: Needs assistance Sitting-balance support: No upper extremity supported;Feet supported Sitting balance-Leahy Scale: Normal Sitting balance - Comments: steady sitting reaching outside BOS   Standing balance support: No upper extremity supported Standing balance-Leahy Scale: Good Standing balance comment: steady standing reaching within BOS washing hands at sink                            Cognition Arousal/Alertness: Awake/alert Behavior During Therapy: WFL for tasks assessed/performed Overall Cognitive Status: Within Functional Limits for tasks assessed                                        Exercises Total Joint Exercises Ankle Circles/Pumps: AROM;Strengthening;Both;10 reps;Supine Quad Sets: AROM;Strengthening;Both;10 reps;Supine Heel Slides: AAROM;Strengthening;Right;10 reps;Supine Straight Leg Raises: AAROM;Strengthening;Right;10 reps;Supine    General Comments General comments (skin integrity, edema, etc.): R knee wound vac and dressings in place.  Pt agreeable to PT session.      Pertinent Vitals/Pain Pain Assessment: 0-10 Pain Score: 0-No pain Pain Location: R knee Pain Intervention(s): Limited activity within patient's tolerance;Monitored during session;Premedicated before  session;Repositioned;Other (comment)(polar care applied and activated)    Home Living                      Prior Function            PT Goals (current goals can now be found in the care plan section) Acute Rehab PT Goals Patient Stated Goal: to go home and go to OP PT PT Goal Formulation: With patient Time For Goal Achievement: 08/15/18 Potential to Achieve  Goals: Good Progress towards PT goals: Progressing toward goals    Frequency    BID      PT Plan Current plan remains appropriate    Co-evaluation              AM-PAC PT "6 Clicks" Mobility   Outcome Measure  Help needed turning from your back to your side while in a flat bed without using bedrails?: A Little Help needed moving from lying on your back to sitting on the side of a flat bed without using bedrails?: A Little Help needed moving to and from a bed to a chair (including a wheelchair)?: A Little Help needed standing up from a chair using your arms (e.g., wheelchair or bedside chair)?: A Little Help needed to walk in hospital room?: A Little Help needed climbing 3-5 steps with a railing? : A Little 6 Click Score: 18    End of Session Equipment Utilized During Treatment: Gait belt Activity Tolerance: Patient tolerated treatment well Patient left: in bed;with call bell/phone within reach;with bed alarm set;with SCD's reapplied(B heels floating via towel roll; polar care in place and activated) Nurse Communication: Mobility status;Precautions;Weight bearing status PT Visit Diagnosis: Other abnormalities of gait and mobility (R26.89);Muscle weakness (generalized) (M62.81);Difficulty in walking, not elsewhere classified (R26.2);Pain Pain - Right/Left: Right Pain - part of body: Knee     Time: [phone] (treatment session interrupted by facility alert) PT Time Calculation (min) (ACUTE ONLY): 68 min  Charges:  $Gait Training: 8-22 mins $Therapeutic Exercise: 8-22 mins $Therapeutic Activity: 8-22 mins                    Leitha Bleak, PT 08/03/18, 5:12 PM [phone]

## 2018-08-04 NOTE — Progress Notes (Signed)
   Subjective: 3 Days Post-Op Procedure(s) (LRB): RIGHT TOTAL KNEE ARTHROPLASTY (Right) Patient reports pain as 1 on 0-10 scale.  Overall pain improving Patient is well, and has had no acute complaints or problems Denies any CP, SOB, ABD pain. We will continue therapy today.  Plan is to go Home after hospital stay.  Objective: Vital signs in last 24 hours: Temp:  [98.7 F (37.1 C)-98.8 F (37.1 C)] 98.7 F (37.1 C) (07/16 2326) Pulse Rate:  [72-76] 76 (07/16 2326) Resp:  [17-18] 18 (07/16 2326) BP: (113-145)/(35-52) 145/52 (07/16 2326) SpO2:  [96 %-97 %] 96 % (07/16 2326)  Intake/Output from previous day: 07/16 0701 - 07/17 0700 In: 720 [P.O.:720] Out: 0  Intake/Output this shift: No intake/output data recorded.  Recent Labs    08/01/18 1136 08/02/18 0442  HGB 11.7* 11.2*   Recent Labs    08/01/18 1136 08/02/18 0442  WBC 9.1 11.5*  RBC 4.04 3.88  HCT 37.3 35.5*  PLT 204 191   Recent Labs    08/01/18 1136 08/02/18 0442  NA  --  137  K  --  4.3  CL  --  107  CO2  --  25  BUN  --  19  CREATININE 0.92 0.76  GLUCOSE  --  165*  CALCIUM  --  8.7*   No results for input(s): LABPT, INR in the last 72 hours.  EXAM General - Patient is Alert, Appropriate and Oriented Extremity - Neurovascular intact Sensation intact distally Intact pulses distally Dorsiflexion/Plantar flexion intact No cellulitis present Compartment soft Dressing - dressing C/D/I and no drainage, Praveena intact without drainage Motor Function - intact, moving foot and toes well on exam.   Past Medical History:  Diagnosis Date  . Arthritis    bilateral knees  . Cancer [facility]) April 2015   left breast  . Coronary artery disease   . Hyperlipidemia   . Hypertension   . Myocardial infarction ([place]) 11/03/2015   [facility], stent placed  . Pneumonia     Assessment/Plan:   3 Days Post-Op Procedure(s) (LRB): RIGHT TOTAL KNEE ARTHROPLASTY (Right) Active Problems:   Status post total knee  replacement using cement, right  Estimated body mass index is 35.7 kg/m as calculated from the following:   Height as of this encounter: 5\' 7"  (1.702 m).   Weight as of this encounter: 103.4 kg. Advance diet Up with therapy  Vital signs stable Care management to assist with discharge to home with home health today  DVT Prophylaxis - Lovenox, TED hose and SCDs Weight-Bearing as tolerated to right   T. Rachelle Hora, PA-C [place] 08/04/2018, 8:15 AM

## 2018-08-04 NOTE — TOC Transition Note (Addendum)
Transition of Care [facility]) - CM/SW Discharge Note   Patient Details  Name: BAYLEI SIEBELS MRN: [id number] Date of Birth: February 22, 1945  [facility]) CM/SW Contact:  Ralyn Stlaurent, Lenice Llamas Phone Number: [phone]  08/04/2018, 9:07 AM   Clinical Narrative: Patient will D/C home today. Per RN patient reported that her outpatient PT appointment has been changed to Wednesday 7/22 at 2 pm. Patient has no DME needs. Patient has been notified of her Lovenox price $100. Please reconsult if future social work needs arise. CSW signing off.      Final next level of care: OP Rehab Barriers to Discharge: Barriers Resolved   Patient Goals and CMS Choice Patient states their goals for this hospitalization and ongoing recovery are:: To improve mobility      Discharge Placement                       Discharge Plan and Services   Discharge Planning Services: CM Consult            DME Arranged: N/A         HH Arranged: NA          Social Determinants of Health (SDOH) Interventions     Readmission Risk Interventions No flowsheet data found.

## 2018-08-04 NOTE — Progress Notes (Signed)
Physical Therapy Treatment Patient Details Name: Faith [name] MRN: [id number] DOB: 1945/09/13 Today's Date: 08/04/2018    History of Present Illness Pt is a 73 y.o. 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  PMH includes L TKA 03/28/18, htn, CAD, h/o MI, cardiac stents, L breast CA, and B knee arthritis.    PT Comments    Pt supine at bed and agreeable to PT session. +1 Min A with transfer standing from bed; +CGA sitting <> standing toilet ; +CGA sitting to recliner and ambulating 240 feet with RW. Vc's provided for quad activation to improve R knee extension during ambulation to prevent risk of fall. Overall good tolerance to therapy today. Progressed well towards goals and safe d/c home. Pt will benefit from OPPT to improve knee ROM, ADLs, and ambulation tolerance.   Follow Up Recommendations  Outpatient PT     Equipment Recommendations  Rolling walker with 5" wheels;3in1 (PT)    Recommendations for Other Services       Precautions / Restrictions Precautions Precautions: Knee;Fall Precaution Booklet Issued: Yes (comment) Precaution Comments: wound vac Restrictions Weight Bearing Restrictions: Yes RLE Weight Bearing: Weight bearing as tolerated    Mobility  Bed Mobility Overal bed mobility: Needs Assistance Bed Mobility: Supine to Sit;Sit to Supine     Supine to sit: Min assist Sit to supine: Min assist   General bed mobility comments: assist for R LE; increased effort/time to perform  Transfers Overall transfer level: Needs assistance Equipment used: Rolling walker (2 wheeled) Transfers: Sit to/from Stand Sit to Stand: Min guard Stand pivot transfers: Min guard       General transfer comment: CGA to stand from bed and sit down recliner  Ambulation/Gait Ambulation/Gait assistance: Min guard Gait Distance (Feet): 240 Feet Assistive device: Rolling walker (2 wheeled) Gait Pattern/deviations: Step-through pattern;Step-to pattern;Decreased stance time - right Gait  velocity: decreased   General Gait Details: partial step through gait pattern; mild R knee flexion during R LE stance phase but no knee buckling noted; steady with RW; vc's provided to maintain quad activation to promote knee extension on R LE   Stairs             Wheelchair Mobility    Modified Rankin (Stroke Patients Only)       Balance Overall balance assessment: Needs assistance Sitting-balance support: No upper extremity supported;Feet supported Sitting balance-Leahy Scale: Normal Sitting balance - Comments: steady sitting reaching outside BOS   Standing balance support: No upper extremity supported Standing balance-Leahy Scale: Good Standing balance comment: steady standing reaching within BOS don on and off underwear                            Cognition Arousal/Alertness: Awake/alert Behavior During Therapy: WFL for tasks assessed/performed Overall Cognitive Status: Within Functional Limits for tasks assessed                                        Exercises Total Joint Exercises Quad Sets: AROM;Strengthening;Right;10 reps;Supine Straight Leg Raises: AAROM;Strengthening;Right;10 reps;Supine Goniometric ROM: R knee extension 4 degrees short of neutral semi-supine in bed; R knee flexion AROM 93 degrees sitting edge of bed    General Comments General comments (skin integrity, edema, etc.): R knee wound vac and dressings in place      Pertinent Vitals/Pain Pain Assessment: No/denies pain Pain Location: R knee  Pain Descriptors / Indicators: Sore Pain Intervention(s): Limited activity within patient's tolerance;Monitored during session;Repositioned    Home Living                      Prior Function            PT Goals (current goals can now be found in the care plan section) Acute Rehab PT Goals Patient Stated Goal: to go home and go to OP PT PT Goal Formulation: With patient Time For Goal Achievement:  08/15/18 Potential to Achieve Goals: Good Progress towards PT goals: Progressing toward goals    Frequency    BID      PT Plan Current plan remains appropriate    Co-evaluation              AM-PAC PT "6 Clicks" Mobility   Outcome Measure  Help needed turning from your back to your side while in a flat bed without using bedrails?: A Little Help needed moving from lying on your back to sitting on the side of a flat bed without using bedrails?: A Little Help needed moving to and from a bed to a chair (including a wheelchair)?: A Little Help needed standing up from a chair using your arms (e.g., wheelchair or bedside chair)?: A Little Help needed to walk in hospital room?: A Little Help needed climbing 3-5 steps with a railing? : A Little 6 Click Score: 18    End of Session Equipment Utilized During Treatment: Gait belt Activity Tolerance: Patient tolerated treatment well Patient left: in chair;with call bell/phone within reach;with chair alarm set;with SCD's reapplied(R heels floating via towel roll; polar care in place and activated) Nurse Communication: Mobility status;Precautions;Weight bearing status PT Visit Diagnosis: Other abnormalities of gait and mobility (R26.89);Muscle weakness (generalized) (M62.81);Difficulty in walking, not elsewhere classified (R26.2);Pain Pain - Right/Left: Right Pain - part of body: Knee     Time: 3704-8889 PT Time Calculation (min) (ACUTE ONLY): 32 min  Charges:                          Sherrilyn Rist, SPT 08/04/2018, 10:53 AM

## 2018-08-04 NOTE — Progress Notes (Signed)
Pt. Discharged to home via family vehicle. Discharge instructions and medication regimen reviewed at bedside with patient. Pt. verbalizes understanding of instructions and medication regimen. Prescriptions with pt. Pt changed to prevena wound vac. Patient assessment unchanged from this morning. IV discontinued per policy.

## 2018-08-04 NOTE — Care Management Important Message (Signed)
Important Message  Patient Details  Name: Faith [name] MRN: [id number] Date of Birth: 06-24-1945   Medicare Important Message Given:  Yes     Juliann Pulse A Levester Waldridge 08/04/2018, 10:47 AM

## 2018-09-07 ENCOUNTER — Ambulatory Visit: Admission: RE | Admit: 2018-09-07 | Payer: Medicare Other | Source: Ambulatory Visit

## 2018-09-07 ENCOUNTER — Other Ambulatory Visit: Payer: Self-pay

## 2018-09-07 ENCOUNTER — Ambulatory Visit
Admission: RE | Admit: 2018-09-07 | Discharge: 2018-09-07 | Disposition: A | Discharge status: Home / Self Care | Payer: Medicare Other | Source: Ambulatory Visit | Attending: Hematology and Oncology | Admitting: Hematology and Oncology

## 2018-09-07 ENCOUNTER — Inpatient Hospital Stay: Payer: Medicare Other | Attending: Hematology and Oncology

## 2018-09-07 DIAGNOSIS — Z170 Estrogen receptor positive status [ER+]: Secondary | ICD-10-CM | POA: Diagnosis not present

## 2018-09-07 DIAGNOSIS — C50812 Malignant neoplasm of overlapping sites of left female breast: Secondary | ICD-10-CM | POA: Insufficient documentation

## 2018-09-07 LAB — COMPREHENSIVE METABOLIC PANEL
ALT: 34 U/L (ref 0–44)
AST: 26 U/L (ref 15–41)
Albumin: 3.9 g/dL (ref 3.5–5.0)
Alkaline Phosphatase: 114 U/L (ref 38–126)
Anion gap: 10 (ref 5–15)
BUN: 19 mg/dL (ref 8–23)
CO2: 26 mmol/L (ref 22–32)
Calcium: 10.2 mg/dL (ref 8.9–10.3)
Chloride: 102 mmol/L (ref 98–111)
Creatinine, Ser: 0.9 mg/dL (ref 0.44–1.00)
GFR calc Af Amer: >60 mL/min (ref >60)
GFR calc non Af Amer: >60 mL/min (ref >60)
Glucose, Bld: 113 mg/dL — ABNORMAL HIGH (ref 70–99)
Potassium: 3.8 mmol/L (ref 3.5–5.1)
Sodium: 138 mmol/L (ref 135–145)
Total Bilirubin: 0.9 mg/dL (ref 0.3–1.2)
Total Protein: 7.4 g/dL (ref 6.5–8.1)

## 2018-09-07 LAB — CBC WITH DIFFERENTIAL/PLATELET
Abs Immature Granulocytes: 0.03 10*3/uL (ref 0.00–0.07)
Basophils Absolute: 0 10*3/uL (ref 0.0–0.1)
Basophils Relative: 1 %
Eosinophils Absolute: 0.2 10*3/uL (ref 0.0–0.5)
Eosinophils Relative: 3 %
HCT: 39 % (ref 36.0–46.0)
Hemoglobin: 13 g/dL (ref 12.0–15.0)
Immature Granulocytes: 0 %
Lymphocytes Relative: 21 %
Lymphs Abs: 1.6 10*3/uL (ref 0.7–4.0)
MCH: 29.3 pg (ref 26.0–34.0)
MCHC: 33.3 g/dL (ref 30.0–36.0)
MCV: 87.8 fL (ref 80.0–100.0)
Monocytes Absolute: 0.6 10*3/uL (ref 0.1–1.0)
Monocytes Relative: 7 %
Neutro Abs: 5.3 10*3/uL (ref 1.7–7.7)
Neutrophils Relative %: 68 %
Platelets: 246 10*3/uL (ref 150–400)
RBC: 4.44 MIL/uL (ref 3.87–5.11)
RDW: 14.2 % (ref 11.5–15.5)
WBC: 7.8 10*3/uL (ref 4.0–10.5)
nRBC: 0 % (ref 0.0–0.2)

## 2018-09-07 MED ORDER — IOHEXOL 300 MG/ML  SOLN
100.0000 mL | Freq: Once | INTRAMUSCULAR | Status: AC | PRN
  Administered 2018-09-07: 09:00:00 100 mL via INTRAVENOUS

## 2018-09-08 LAB — CANCER ANTIGEN 27.29: CA 27.29: 9 U/mL (ref 0.0–38.6)

## 2018-09-11 ENCOUNTER — Ambulatory Visit: Payer: Medicare Other | Admitting: Hematology and Oncology

## 2018-09-26 ENCOUNTER — Other Ambulatory Visit: Payer: Self-pay

## 2018-09-26 ENCOUNTER — Ambulatory Visit
Admission: RE | Admit: 2018-09-26 | Discharge: 2018-09-26 | Disposition: A | Discharge status: Home / Self Care | Payer: Medicare Other | Source: Ambulatory Visit | Attending: Hematology and Oncology | Admitting: Hematology and Oncology

## 2018-09-26 DIAGNOSIS — Z13820 Encounter for screening for osteoporosis: Secondary | ICD-10-CM | POA: Insufficient documentation

## 2018-09-26 DIAGNOSIS — Z780 Asymptomatic menopausal state: Secondary | ICD-10-CM | POA: Insufficient documentation

## 2018-09-26 DIAGNOSIS — M8580 Other specified disorders of bone density and structure, unspecified site: Secondary | ICD-10-CM | POA: Diagnosis not present

## 2018-09-26 DIAGNOSIS — Z96653 Presence of artificial knee joint, bilateral: Secondary | ICD-10-CM | POA: Insufficient documentation

## 2018-09-26 DIAGNOSIS — C50812 Malignant neoplasm of overlapping sites of left female breast: Secondary | ICD-10-CM | POA: Insufficient documentation

## 2018-09-26 DIAGNOSIS — Z170 Estrogen receptor positive status [ER+]: Secondary | ICD-10-CM | POA: Diagnosis present

## 2018-09-26 NOTE — Progress Notes (Signed)
[facility]  [address] [place], [place] [address] Phone: [phone]  Fax: [phone]   Clinic Day:  09/28/2018  Referring physician: No ref. provider found  Chief Complaint: Faith [name] is a 73 y.o. female with metastatic breast cancer who is seen for 2 month assessment, review of imaging, and discussion regarding direction of therapy.  HPI:  The patient was last seen in the medical oncology clinic for a telemedicine visit on 07/24/2018. At that time, she was recovering well from left knee surgery. She denied any complaints. Patient has held her Femara and [name] secondary to surgeries since prior to 03/28/2018.  We discussed restarting Femara as she was planning another knee replacement; patient declined. We discussed re-imaging studies to assess status of disease.   Patient was seen by Dorise Hiss, PA-C on 07/27/2018 for a right knee pre-op exam. Patient had severe right knee osteoarthritis that had been non-responsive to conservative treatment consisting of cortisone injections and hyaluronic injections. Her right knee pain was moderate but worse at the end of the day. Patient agreed to right total knee arthroplasty procedure with Dr. Rudene Christians.  Patient was admitted to [facility] from 08/01/2018 - 08/04/2018. She underwent a total right knee replacement on 08/01/2018. She tolerated the surgery well.    Chest, abdomen, and pelvis CT on 09/07/2018 revealed no acute intrathoracic, abdominal, or pelvis pathology.  There was no evidence of metastatic disease.  There were multiple uterine fibroids. A 17 x 11 mm lesion in the region of the of the lower endometrium, likely a submucosal or intracavitary fibroid.  A polyp or an endometrial lesion was not excluded. Pelvic ultrasound may provide better evaluation. There was colonic diverticulosis. There was no bowel obstruction or active inflammation, and a normal appendix.   Labs on 09/07/2018: Hematocrit 39.0,  hemoglobin 13.0, platelets 246,000, WBC 7,800. CA 27.29 was 9.0.  Bone density on 09/26/2018 revealed osteopenia with a T score of -1.8 in the AP spine L1-L4.  During the interim, the patient felt "wonderful". Her right knee pain is improving. She has not restarted her Femara and has no intention on restarting the medication. Her weight is down 25 pounds which she attributes to being off Ibrance. She notes Ibrance caused weight gain "like crazy". Her baseline weight is 219 - 221 pounds; her goal weight is 170 pounds. She reports not having any teeth when Prolia was discussed.     Her last appointment with Dr. Rudene Christians was good; she will follow-up in 6 month.    Past Medical History:  Diagnosis Date  . Arthritis    bilateral knees  . Cancer [facility] April 2015   left breast  . Coronary artery disease   . Hyperlipidemia   . Hypertension   . Myocardial infarction ([place]) 11/03/2015   [facility], stent placed  . Pneumonia     Past Surgical History:  Procedure Laterality Date  . BREAST SURGERY Bilateral 11/19/13   bilater mastectomy   . CARDIAC CATHETERIZATION    . CORONARY ANGIOPLASTY    . PORT-A-CATH REMOVAL  2016  . PORTACATH PLACEMENT  06-07-13  . SHOULDER OPEN ROTATOR CUFF REPAIR  2012  . TOTAL KNEE ARTHROPLASTY Left 03/28/2018   Procedure: TOTAL KNEE ARTHROPLASTY-LEFT;  Surgeon: Hessie Knows, MD;  Location: [facility] ORS;  Service: Orthopedics;  Laterality: Left;  . TOTAL KNEE ARTHROPLASTY Right 08/01/2018   Procedure: RIGHT TOTAL KNEE ARTHROPLASTY;  Surgeon: Hessie Knows, MD;  Location: [facility] ORS;  Service: Orthopedics;  Laterality: Right;  .  WRIST SURGERY Left 1969   cyst    Family History  Adopted: Yes    Social History:  reports that she quit smoking about 33 years ago. Her smoking use included cigarettes. She has a 15.00 pack-year smoking history. She has never used smokeless tobacco. She reports current alcohol use. She reports that she does not use drugs. She is retired; she previously  drove a truck. She is from [place]. The patient is alone today.  Allergies:  Allergies  Allergen Reactions  . Mushroom Extract Complex Hives and Swelling  . Nickel Rash  . Shellfish Allergy Nausea And Vomiting    Current Medications: Current Outpatient Medications  Medication Sig Dispense Refill  . Ascorbic Acid (VITAMIN C) 1000 MG tablet Take 1,000 mg by mouth daily.    Marland Kitchen aspirin EC 81 MG tablet Take 81 mg by mouth daily.    Marland Kitchen atorvastatin (LIPITOR) 80 MG tablet Take 80 mg by mouth daily at 6 PM.     . Biotin 5 MG CAPS Take 5 mg by mouth 2 (two) times a day.    . Calcium Carb-Cholecalciferol (CALCIUM 600+D3 PO) Take 1 tablet by mouth 2 (two) times a day.    . Carboxymethylcellul-Glycerin (REFRESH OPTIVE) 1-0.9 % GEL Place 1 drop into both eyes at bedtime.    . cetirizine (ZYRTEC) 10 MG tablet Take 10 mg by mouth daily.    . Cholecalciferol (VITAMIN D3) 50 MCG (2000 UT) TABS Take 2,000 Units by mouth 2 (two) times a day.    . Coenzyme Q10 (COQ10) 100 MG CAPS Take 100 mg by mouth daily.    Marland Kitchen losartan (COZAAR) 50 MG tablet Take 50 mg by mouth daily.     . metoprolol tartrate (LOPRESSOR) 25 MG tablet Take 25 mg by mouth 2 (two) times daily.     . nitroGLYCERIN (NITROSTAT) 0.4 MG SL tablet Place 0.4 mg under the tongue every 5 (five) minutes as needed for chest pain.      No current facility-administered medications for this visit.    Facility-Administered Medications Ordered in Other Visits  Medication Dose Route Frequency Provider Last Rate Last Dose  . sodium chloride 0.9 % injection 10 mL  10 mL Intravenous PRN Choksi, Delorise Shiner, MD   10 mL at 09/12/14 1014    Review of Systems  Constitutional: Positive for weight loss (25 lbs felt secondary to not being on Ibrance). Negative for chills, diaphoresis, fever and malaise/fatigue.       Feels "wonderful".  HENT: Negative.  Negative for congestion, hearing loss, nosebleeds, sinus pain and sore throat.        Edentulous.  Eyes: Negative.   Negative for blurred vision and double vision.  Respiratory: Negative.  Negative for cough, shortness of breath and wheezing.   Cardiovascular: Negative.  Negative for chest pain, palpitations, orthopnea, leg swelling and PND.       S/p MI 10/2015.   Gastrointestinal: Negative.  Negative for abdominal pain, blood in stool, constipation, diarrhea, melena, nausea and vomiting.  Genitourinary: Negative.  Negative for dysuria, frequency, hematuria and urgency.  Musculoskeletal: Positive for joint pain (recovering well from recent right knee replacement). Negative for back pain and myalgias.       S/p bilateral knee replacement.  Skin: Negative.  Negative for rash.  Neurological: Negative.  Negative for dizziness, tingling, sensory change, speech change, focal weakness, weakness and headaches.  Endo/Heme/Allergies: Negative.  Does not bruise/bleed easily.  Psychiatric/Behavioral: Negative.  Negative for depression and memory loss. The patient  is not nervous/anxious and does not have insomnia.   All other systems reviewed and are negative.  Performance status (ECOG): 1  Vitals Blood pressure 138/60, pulse 77, temperature 97.7 F (36.5 C), temperature source Oral, resp. rate 18, weight 223 lb 5.2 oz (101.3 kg), SpO2 98 %.  Physical Exam  Constitutional: She is oriented to person, place, and time. She appears well-developed and well-nourished. No distress.  Shoulder length graying hair.  HENT:  Head: Normocephalic and atraumatic.  Mouth/Throat: Oropharynx is clear and moist. No oropharyngeal exudate.  Shoulder length gray hair. Mask.  Eyes: Pupils are equal, round, and reactive to light. Conjunctivae and EOM are normal. No scleral icterus.  Glasses.  Hazel eyes.  Neck: Normal range of motion. Neck supple. No JVD present.  Cardiovascular: Normal rate, regular rhythm and normal heart sounds.  No murmur heard. Pulmonary/Chest: Effort normal and breath sounds normal. No respiratory distress. She  has no wheezes. She has no rales. She exhibits no tenderness.  Chest wall without erythema or nodularity.  Abdominal: Soft. Bowel sounds are normal. She exhibits no mass. There is no abdominal tenderness. There is no rebound and no guarding.  Musculoskeletal: Normal range of motion.        General: No tenderness or edema.  Lymphadenopathy:    She has no cervical adenopathy.    She has no axillary adenopathy.       Right: No supraclavicular adenopathy present.       Left: No supraclavicular adenopathy present.  Neurological: She is alert and oriented to person, place, and time.  Skin: Skin is warm and dry. No rash noted. She is not diaphoretic. No erythema. No pallor.  Psychiatric: She has a normal mood and affect. Her behavior is normal. Judgment and thought content normal.  Nursing note and vitals reviewed.   Imaging studies: 05/09/2013: Chest CT revealed a 2.8 cm soft tissue nodule in the left axillary tail. There was bulky axillary adenopathy (largest 1.8 cm). There were prominent mediastinal lymph nodes (7.3 cm AP window; 7 mm and 8 mm anterior to the central right mainstem bronchus, 11 mm central right hilar lymph node). There was a small left pleural effusion.  05/29/2013: Bone scanrevealed no evidence of metastatic disease.  05/31/2013: Abdomen and pelvis CTrevealed no evidence of abdominal pelvic metastatic disease.There was sclerosis of the upper sacrum is without abnormal activity on recent bone scan,likely incidental. 06/02/2015:PET scanrevealed nohypermetabolic recurrent or residual disease. There was suspicion of a right middle lobe pulmonary nodule, suboptimally evaluated.  11/19/2015:Chest, abdomen, and pelvis CTrevealed no evidence of metastatic disease. The 4 mm rightmiddle lobenodulewasnot identified. 05/21/2016:Chest, abdomen and pelvis CTrevealed no evidence of metastatic disease. 02/21/2017:Chest, abdomen and pelvis CTrevealed no evidence  of metastatic disease. 12/01/2017:Chest, abdomen and pelvis CTrevealed no evidence of metastatic disease. 09/07/2018:  Chest, abdomen, and pelvis CT revealed no acute intrathoracic, abdominal, or pelvis pathology.  There was no evidence of metastatic disease.  There were multiple uterine fibroids. A 17 x 11 mm lesion in the region of the DB trauma, likely a submucosal or intracavitary fibroid.  A polyp was not excluded. Pelvic ultrasound may provide better evaluation on a non-emergent basis. There was colonic diverticulosis. There was no bowel obstruction or active inflammation, and a normal appendix.  09/26/2018:  Bone density revealed osteopenia with a T score of -1.8 in the AP spine L1-L4.   No visits with results within 3 Day(s) from this visit.  Latest known visit with results is:  Appointment on 09/07/2018  Component Date Value Ref Range Status  . CA 27.29 09/07/2018 9.0  0.0 - 38.6 U/mL Final   Comment: (NOTE) Siemens Centaur Immunochemiluminometric Methodology [facility]) Values obtained with different assay methods or kits cannot be used interchangeably. Results cannot be interpreted as absolute evidence of the presence or absence of malignant disease. Performed At: [facility] [place], [place] [id number] Rush Farmer MD DB:[id number]   . Sodium 09/07/2018 138  135 - 145 mmol/L Final  . Potassium 09/07/2018 3.8  3.5 - 5.1 mmol/L Final  . Chloride 09/07/2018 102  98 - 111 mmol/L Final  . CO2 09/07/2018 26  22 - 32 mmol/L Final  . Glucose, Bld 09/07/2018 113* 70 - 99 mg/dL Final  . BUN 09/07/2018 19  8 - 23 mg/dL Final  . Creatinine, Ser 09/07/2018 0.90  0.44 - 1.00 mg/dL Final  . Calcium 09/07/2018 10.2  8.9 - 10.3 mg/dL Final  . Total Protein 09/07/2018 7.4  6.5 - 8.1 g/dL Final  . Albumin 09/07/2018 3.9  3.5 - 5.0 g/dL Final  . AST 09/07/2018 26  15 - 41 U/L Final  . ALT 09/07/2018 34  0 - 44 U/L Final  . Alkaline Phosphatase 09/07/2018 114  38 - 126  U/L Final  . Total Bilirubin 09/07/2018 0.9  0.3 - 1.2 mg/dL Final  . GFR calc non Af Amer 09/07/2018 >60  >60 mL/min Final  . GFR calc Af Amer 09/07/2018 >60  >60 mL/min Final  . Anion gap 09/07/2018 10  5 - 15 Final   Performed at [facility], [address] [place], [place] [address]  . WBC 09/07/2018 7.8  4.0 - 10.5 K/uL Final  . RBC 09/07/2018 4.44  3.87 - 5.11 MIL/uL Final  . Hemoglobin 09/07/2018 13.0  12.0 - 15.0 g/dL Final  . HCT 09/07/2018 39.0  36.0 - 46.0 % Final  . MCV 09/07/2018 87.8  80.0 - 100.0 fL Final  . MCH 09/07/2018 29.3  26.0 - 34.0 pg Final  . MCHC 09/07/2018 33.3  30.0 - 36.0 g/dL Final  . RDW 09/07/2018 14.2  11.5 - 15.5 % Final  . Platelets 09/07/2018 246  150 - 400 K/uL Final  . nRBC 09/07/2018 0.0  0.0 - 0.2 % Final  . Neutrophils Relative % 09/07/2018 68  % Final  . Neutro Abs 09/07/2018 5.3  1.7 - 7.7 K/uL Final  . Lymphocytes Relative 09/07/2018 21  % Final  . Lymphs Abs 09/07/2018 1.6  0.7 - 4.0 K/uL Final  . Monocytes Relative 09/07/2018 7  % Final  . Monocytes Absolute 09/07/2018 0.6  0.1 - 1.0 K/uL Final  . Eosinophils Relative 09/07/2018 3  % Final  . Eosinophils Absolute 09/07/2018 0.2  0.0 - 0.5 K/uL Final  . Basophils Relative 09/07/2018 1  % Final  . Basophils Absolute 09/07/2018 0.0  0.0 - 0.1 K/uL Final  . Immature Granulocytes 09/07/2018 0  % Final  . Abs Immature Granulocytes 09/07/2018 0.03  0.00 - 0.07 K/uL Final   Performed at [facility], [address], [place], [place] [address]    Assessment:  Faith [name] is a 73 y.o. female with metastatic breast cancers/p biopsyon 05/11/2013. Pathology revealed invasive mammary carcinoma. Axillary lymph node was positive for metastatic disease. Tumor was ERpositive (>90%),PRpositive (80%)and HER-2/neu 2+ by IHC (equivocal by [facility]).Clinical stagewas T4dN1M1(stage IV).  Chest CTon 05/09/2013 revealed a 2.8 cm soft tissue nodule in the left axillary tail.  There was bulky  axillary adenopathy (largest 1.8 cm). There were prominent mediastinal lymph nodes (7.3 cm AP window; 7 mm and 8 mm anterior to the central right mainstem bronchus, 11 mm central right hilar lymph node). There was a small left pleural effusion. Bone scanon 05/29/2013 revealed no evidence of metastatic disease. Abdomen and pelvis CTon 05/31/2013 revealed no evidence of abdominal pelvic metastatic disease.  Shereceived 1 cycle of neoadjuvantTCH and Perjetabeginning05/2015.She then received 3 cycles ofAdriamycin and Cytoxan(AC)from08/14/2015-10/19/2013.  She underwent bilateral mastectomyon 11/19/2013.Left breast pathologyrevealed a 3.7 cm grade III invasive mammary carcinoma with extensive lymphovascular invasion within breast and skin. There was metastatic carcinoma in 17 of 17 lymph nodes. Right breast pathologyrevealed extensive lymphovascular invasion by metastatic mammary carcinoma. One lymph node was negative for malignancy. Pathologic stagewas [id number] (stage IV disease).RepeatHER-2/neutesting by [facility] was equivocal.  She began Femara and Ibrancein 11/2013 (last cycle began 03/05/2018).  Chest, abdomen, and pelvis CTon 12/01/2017 revealed no evidence of metastatic disease.  Chest, abdomen, and pelvis CT on 09/07/2018 revealed no acute intrathoracic, abdominal, or pelvis pathology.  There was no evidence of metastatic disease.  There were multiple uterine fibroids. A 17 x 11 mm lesion in the region of the DB trauma, likely a submucosal or intracavitary fibroid.  A polyp was not excluded. Pelvic ultrasound may provide better evaluation on a non-emergent basis. There was colonic diverticulosis. There was no bowel obstruction or active inflammation, and a normal appendix.   CA27.29 has been followed: 60.3 on 05/18/2013, < 3.5 on 11/29/2013, 19.6 on 04/18/2014, 22.9 on 05/16/2014, 16.9 on 08/12/2014, 12.2 on 10/14/2014, 11.9 on 01/06/2015,  11.5 on 04/11/2015, 17.3 on 09/09/2015, 15.7 on 11/19/2015, 15.3 on 02/24/2016, 17.5 on 05/21/2016, 17.1 on 08/24/2016, 17.2 on 11/23/2016, 12.6 on 02/21/2017, 13.2 on 05/24/2017, 18.0 on 07/26/2017, 10.7 on 10/04/2017, 18.0 on 12/01/2017, 12.1 on 02/07/2018, 13.1 on 02/28/2018, and 9.0 on 02/28/2018.  She had a STEMIon 11/03/2015. Cardiac cath revealed 100% occlusion of the proximal left circumflex artery. She was treated at [facility] with primary PCI with drug eluting stent (DES) and tirofiban. Myocardial perfusion SPECT on 03/06/2018 revealed an EF of 59%, normal wall motion, and homogeneous tracer distribution throughout the myocardium.  She has osteoarthritis of her knees. She underwent left knee replacement on 03/28/2018 and right knee replacement on 08/01/2018.   Bone density on 09/26/2018 revealed osteopenia with a T score of -1.8 in the AP spine L1-L4.  Symptomatically, she feels wonderful s/p bilateral knee replacement.  She has lost weight.  Exam is unremarkable.  Plan: 1.   Review labs from 09/07/2018. 2.Metastatic breast cancer Patient has been off [place] since prior to 03/28/2018. Discuss plans for restarting Femara and Ibrance.   Patient declines adamantly. Discuss chest abdomen and pelvic CT from 09/07/2018.   There is no evidence of metastatic disease.   Discuss initial imaging studies and diagnosis of stage IV disease.   Again, patient is not interested in restarting treatment.  Patient agrees to continued surveillance. 3.Osteoarthritis of the knees             Patient is s/p bilateral knee replacement.             Clinically she is doing well.  Discuss clearance for initiation of Femara by Dr. Rudene Christians, she declines reinitiation. 4.Osteopenia Discussed bone density from 09/26/2018.    Continue calcium and vitamin D.   5.Weight loss  Discuss concern for weight loss which patient feels is related to  prior Ibrance and Femara.  Baseline weight is 219-221.  Goal weight is 150-175.  Discuss consideration of a PET scan if weight continues to decrease. 6.   Fibroids  CT imaging reveals multiple uterine fibroids and a 17 x 11 mm lesion in the region of the DB trauma (?), likely a submucosal or intracavitary fibroid.    Discuss with radiology.  Consider pelvic ultrasound. 7.   RTC in 3-4 months for MD assessment and labs (CBC with differential, CMP, CA 27-29).  I discussed the assessment and treatment plan with the patient.  The patient was provided an opportunity to ask questions and all were answered.  The patient agreed with the plan and demonstrated an understanding of the instructions.  The patient was advised to call back if the symptoms worsen or if the condition fails to improve as anticipated.   Lequita Asal, MD, PhD    09/28/2018, 9:13 AM  I, Selena Batten, am acting as scribe for [employer]. Mike Gip, MD, PhD.  I, Melissa C. Mike Gip, MD, have reviewed the above documentation for accuracy and completeness, and I agree with the above.

## 2018-09-28 ENCOUNTER — Other Ambulatory Visit: Payer: Self-pay

## 2018-09-28 ENCOUNTER — Encounter: Payer: Self-pay | Admitting: Hematology and Oncology

## 2018-09-28 ENCOUNTER — Inpatient Hospital Stay: Payer: Medicare Other | Attending: Hematology and Oncology | Admitting: Hematology and Oncology

## 2018-09-28 VITALS — BP 138/60 | HR 77 | Temp 97.7°F | Resp 18 | Wt 223.3 lb

## 2018-09-28 DIAGNOSIS — I10 Essential (primary) hypertension: Secondary | ICD-10-CM | POA: Diagnosis not present

## 2018-09-28 DIAGNOSIS — M8580 Other specified disorders of bone density and structure, unspecified site: Secondary | ICD-10-CM | POA: Insufficient documentation

## 2018-09-28 DIAGNOSIS — Z7189 Other specified counseling: Secondary | ICD-10-CM | POA: Diagnosis not present

## 2018-09-28 DIAGNOSIS — C50919 Malignant neoplasm of unspecified site of unspecified female breast: Secondary | ICD-10-CM | POA: Diagnosis present

## 2018-09-28 DIAGNOSIS — D259 Leiomyoma of uterus, unspecified: Secondary | ICD-10-CM | POA: Diagnosis not present

## 2018-09-28 DIAGNOSIS — Z79899 Other long term (current) drug therapy: Secondary | ICD-10-CM | POA: Diagnosis not present

## 2018-09-28 DIAGNOSIS — Z170 Estrogen receptor positive status [ER+]: Secondary | ICD-10-CM

## 2018-09-28 DIAGNOSIS — M8588 Other specified disorders of bone density and structure, other site: Secondary | ICD-10-CM

## 2018-09-28 DIAGNOSIS — Z9013 Acquired absence of bilateral breasts and nipples: Secondary | ICD-10-CM | POA: Diagnosis not present

## 2018-09-28 DIAGNOSIS — C50812 Malignant neoplasm of overlapping sites of left female breast: Secondary | ICD-10-CM

## 2018-09-28 DIAGNOSIS — Z7982 Long term (current) use of aspirin: Secondary | ICD-10-CM | POA: Diagnosis not present

## 2018-09-28 DIAGNOSIS — E785 Hyperlipidemia, unspecified: Secondary | ICD-10-CM | POA: Diagnosis not present

## 2018-09-28 DIAGNOSIS — Z96653 Presence of artificial knee joint, bilateral: Secondary | ICD-10-CM | POA: Insufficient documentation

## 2018-09-28 DIAGNOSIS — R634 Abnormal weight loss: Secondary | ICD-10-CM

## 2018-09-28 NOTE — Progress Notes (Signed)
Pt here for follow up. Denies any concerns.  

## 2018-10-05 DIAGNOSIS — R634 Abnormal weight loss: Secondary | ICD-10-CM | POA: Insufficient documentation

## 2018-12-12 ENCOUNTER — Other Ambulatory Visit: Payer: Self-pay

## 2018-12-12 DIAGNOSIS — E7800 Pure hypercholesterolemia, unspecified: Secondary | ICD-10-CM

## 2019-01-25 ENCOUNTER — Other Ambulatory Visit: Payer: Self-pay

## 2019-01-25 ENCOUNTER — Inpatient Hospital Stay: Payer: Medicare Other | Attending: Hematology and Oncology

## 2019-01-25 DIAGNOSIS — I10 Essential (primary) hypertension: Secondary | ICD-10-CM | POA: Insufficient documentation

## 2019-01-25 DIAGNOSIS — M170 Bilateral primary osteoarthritis of knee: Secondary | ICD-10-CM | POA: Insufficient documentation

## 2019-01-25 DIAGNOSIS — C773 Secondary and unspecified malignant neoplasm of axilla and upper limb lymph nodes: Secondary | ICD-10-CM | POA: Diagnosis not present

## 2019-01-25 DIAGNOSIS — D259 Leiomyoma of uterus, unspecified: Secondary | ICD-10-CM | POA: Insufficient documentation

## 2019-01-25 DIAGNOSIS — Z170 Estrogen receptor positive status [ER+]: Secondary | ICD-10-CM | POA: Diagnosis not present

## 2019-01-25 DIAGNOSIS — I252 Old myocardial infarction: Secondary | ICD-10-CM | POA: Diagnosis not present

## 2019-01-25 DIAGNOSIS — M8580 Other specified disorders of bone density and structure, unspecified site: Secondary | ICD-10-CM | POA: Insufficient documentation

## 2019-01-25 DIAGNOSIS — C50912 Malignant neoplasm of unspecified site of left female breast: Secondary | ICD-10-CM | POA: Insufficient documentation

## 2019-01-25 DIAGNOSIS — Z87891 Personal history of nicotine dependence: Secondary | ICD-10-CM | POA: Insufficient documentation

## 2019-01-25 DIAGNOSIS — E785 Hyperlipidemia, unspecified: Secondary | ICD-10-CM | POA: Diagnosis not present

## 2019-01-25 DIAGNOSIS — E7800 Pure hypercholesterolemia, unspecified: Secondary | ICD-10-CM

## 2019-01-25 DIAGNOSIS — Z7982 Long term (current) use of aspirin: Secondary | ICD-10-CM | POA: Diagnosis not present

## 2019-01-25 DIAGNOSIS — Z79899 Other long term (current) drug therapy: Secondary | ICD-10-CM | POA: Insufficient documentation

## 2019-01-25 DIAGNOSIS — Z79811 Long term (current) use of aromatase inhibitors: Secondary | ICD-10-CM | POA: Insufficient documentation

## 2019-01-25 DIAGNOSIS — C50812 Malignant neoplasm of overlapping sites of left female breast: Secondary | ICD-10-CM

## 2019-01-25 LAB — COMPREHENSIVE METABOLIC PANEL
ALT: 26 U/L (ref 0–44)
AST: 21 U/L (ref 15–41)
Albumin: 3.8 g/dL (ref 3.5–5.0)
Alkaline Phosphatase: 102 U/L (ref 38–126)
Anion gap: 10 (ref 5–15)
BUN: 27 mg/dL — ABNORMAL HIGH (ref 8–23)
CO2: 28 mmol/L (ref 22–32)
Calcium: 9.8 mg/dL (ref 8.9–10.3)
Chloride: 100 mmol/L (ref 98–111)
Creatinine, Ser: 1 mg/dL (ref 0.44–1.00)
GFR calc Af Amer: >60 mL/min (ref >60)
GFR calc non Af Amer: 55 mL/min — ABNORMAL LOW (ref >60)
Glucose, Bld: 126 mg/dL — ABNORMAL HIGH (ref 70–99)
Potassium: 4.1 mmol/L (ref 3.5–5.1)
Sodium: 138 mmol/L (ref 135–145)
Total Bilirubin: 0.8 mg/dL (ref 0.3–1.2)
Total Protein: 7.4 g/dL (ref 6.5–8.1)

## 2019-01-25 LAB — CBC WITH DIFFERENTIAL/PLATELET
Abs Immature Granulocytes: 0.04 10*3/uL (ref 0.00–0.07)
Basophils Absolute: 0.1 10*3/uL (ref 0.0–0.1)
Basophils Relative: 1 %
Eosinophils Absolute: 0.3 10*3/uL (ref 0.0–0.5)
Eosinophils Relative: 3 %
HCT: 39.8 % (ref 36.0–46.0)
Hemoglobin: 13.2 g/dL (ref 12.0–15.0)
Immature Granulocytes: 0 %
Lymphocytes Relative: 17 %
Lymphs Abs: 1.8 10*3/uL (ref 0.7–4.0)
MCH: 29.3 pg (ref 26.0–34.0)
MCHC: 33.2 g/dL (ref 30.0–36.0)
MCV: 88.2 fL (ref 80.0–100.0)
Monocytes Absolute: 0.6 10*3/uL (ref 0.1–1.0)
Monocytes Relative: 6 %
Neutro Abs: 7.7 10*3/uL (ref 1.7–7.7)
Neutrophils Relative %: 73 %
Platelets: 225 10*3/uL (ref 150–400)
RBC: 4.51 MIL/uL (ref 3.87–5.11)
RDW: 13.2 % (ref 11.5–15.5)
WBC: 10.6 10*3/uL — ABNORMAL HIGH (ref 4.0–10.5)
nRBC: 0 % (ref 0.0–0.2)

## 2019-01-25 LAB — LIPID PANEL
Cholesterol: 167 mg/dL (ref 0–200)
HDL: 42 mg/dL (ref >40)
LDL Cholesterol: 93 mg/dL (ref 0–99)
Total CHOL/HDL Ratio: 4 RATIO
Triglycerides: 161 mg/dL — ABNORMAL HIGH (ref <150)
VLDL: 32 mg/dL (ref 0–40)

## 2019-01-26 LAB — CANCER ANTIGEN 27.29: CA 27.29: 11.9 U/mL (ref 0.0–38.6)

## 2019-01-26 NOTE — Progress Notes (Signed)
Confirmed Name and DOB. Denies any concerns.  

## 2019-01-27 NOTE — Progress Notes (Signed)
[facility]  [address] [place], [place] [address] Phone: [phone]  Fax: [phone]   [place] Office Visit:  01/29/2019  Referring physician: No ref. provider found  I connected with Ilina Xu Millman on 01/29/19 at 8:39 AM by videoconferencing and verified that I was speaking with the correct person using 2 identifiers.  The patient was at home.  I discussed the limitations, risk, security and privacy concerns of performing an evaluation and management service by videoconferencing and the availability of in person appointments.  I also discussed with the patient that there may be a patient responsible charge related to this service.  The patient expressed understanding and agreed to proceed.   Chief Complaint: Faith [name] is a 74 y.o. female with metastatic breast cancer  who is seen for 3 month assessment.   HPI: The patient was last seen in the medical oncology clinic on 09/28/2018. At that time, she felt wonderful s/p bilateral knee replacement.  She had lost weight. Patient has held her Femara and [name] secondary to surgeries since prior to 03/28/2018.  Exam was unremarkable.  Labs on 01/25/2019 revealed a hematocrit 39.8, hemoglobin 13.2, MCV 88.2, platelets 225,000, WBC 10600, ANC 7700. CA27.29 was 11.9.  During the interim, she has felt "wonderful".  She comments that her hair is growing back.  She has more energy.  Her knees hurt occasionally if she sits too long.  She has a follow-up with Dr. Rudene Christians in 02/2019.   Past Medical History:  Diagnosis Date  . Arthritis    bilateral knees  . Cancer [facility]) April 2015   left breast  . Coronary artery disease   . Hyperlipidemia   . Hypertension   . Myocardial infarction ([place]) 11/03/2015   [facility], stent placed  . Pneumonia     Past Surgical History:  Procedure Laterality Date  . BREAST SURGERY Bilateral 11/19/13   bilater mastectomy   . CARDIAC CATHETERIZATION    . CORONARY ANGIOPLASTY      . PORT-A-CATH REMOVAL  2016  . PORTACATH PLACEMENT  06-07-13  . SHOULDER OPEN ROTATOR CUFF REPAIR  2012  . TOTAL KNEE ARTHROPLASTY Left 03/28/2018   Procedure: TOTAL KNEE ARTHROPLASTY-LEFT;  Surgeon: Hessie Knows, MD;  Location: [facility] ORS;  Service: Orthopedics;  Laterality: Left;  . TOTAL KNEE ARTHROPLASTY Right 08/01/2018   Procedure: RIGHT TOTAL KNEE ARTHROPLASTY;  Surgeon: Hessie Knows, MD;  Location: [facility] ORS;  Service: Orthopedics;  Laterality: Right;  . WRIST SURGERY Left 1969   cyst    Family History  Adopted: Yes    Social History:  reports that she quit smoking about 33 years ago. Her smoking use included cigarettes. She has a 15.00 pack-year smoking history. She has never used smokeless tobacco. She reports current alcohol use. She reports that she does not use drugs. She is retired; she previously drove a truck. She is from [place]. The patient was alone today.  Participants in the patient's visit and their role in the encounter included the patient and Waymon Budge, RN, today.  The intake visit was provided by Waymon Budge, RN.   Allergies:  Allergies  Allergen Reactions  . Mushroom Extract Complex Hives and Swelling  . Nickel Rash  . Shellfish Allergy Nausea And Vomiting    Current Medications: Current Outpatient Medications  Medication Sig Dispense Refill  . Ascorbic Acid (VITAMIN C) 1000 MG tablet Take 1,000 mg by mouth daily.    Marland Kitchen aspirin EC 81 MG tablet Take 81 mg  by mouth daily.    Marland Kitchen atorvastatin (LIPITOR) 80 MG tablet Take 80 mg by mouth daily at 6 PM.     . Biotin 5 MG CAPS Take 5 mg by mouth 2 (two) times a day.    . Calcium Carb-Cholecalciferol (CALCIUM 600+D3 PO) Take 1 tablet by mouth 2 (two) times a day.    . Carboxymethylcellul-Glycerin (REFRESH OPTIVE) 1-0.9 % GEL Place 1 drop into both eyes at bedtime.    . cetirizine (ZYRTEC) 10 MG tablet Take 10 mg by mouth daily.    . Cholecalciferol (VITAMIN D3) 50 MCG (2000 UT) TABS Take 2,000 Units  by mouth 2 (two) times a day.    . Coenzyme Q10 (COQ10) 100 MG CAPS Take 100 mg by mouth daily.    Marland Kitchen losartan (COZAAR) 50 MG tablet Take 50 mg by mouth daily.     . metoprolol tartrate (LOPRESSOR) 25 MG tablet Take 25 mg by mouth 2 (two) times daily.     . nitroGLYCERIN (NITROSTAT) 0.4 MG SL tablet Place 0.4 mg under the tongue every 5 (five) minutes as needed for chest pain.      No current facility-administered medications for this visit.   Facility-Administered Medications Ordered in Other Visits  Medication Dose Route Frequency Provider Last Rate Last Admin  . sodium chloride 0.9 % injection 10 mL  10 mL Intravenous PRN Choksi, Janak, MD   10 mL at 09/12/14 1014    Review of Systems  Constitutional: Positive for weight loss (weight gain over Christmas; trying to lose weight). Negative for chills, diaphoresis, fever and malaise/fatigue.       Feels "wonderful".  HENT: Negative.  Negative for congestion, hearing loss, nosebleeds, sinus pain and sore throat.   Eyes: Negative.  Negative for blurred vision and double vision.  Respiratory: Negative.  Negative for cough, sputum production, shortness of breath and wheezing.   Cardiovascular: Negative.  Negative for chest pain, palpitations, orthopnea, leg swelling and PND.       S/p MI 10/2015.   Gastrointestinal: Negative.  Negative for abdominal pain, blood in stool, constipation, diarrhea, melena, nausea and vomiting.  Genitourinary: Negative.  Negative for dysuria, frequency, hematuria and urgency.  Musculoskeletal: Positive for joint pain (knees hurt occasionally). Negative for back pain and myalgias.       S/p bilateral knee replacement.  Skin: Negative.  Negative for rash.  Neurological: Negative.  Negative for dizziness, tingling, sensory change, speech change, focal weakness, weakness and headaches.  Endo/Heme/Allergies: Negative.  Does not bruise/bleed easily.  Psychiatric/Behavioral: Negative.  Negative for depression and memory  loss. The patient is not nervous/anxious and does not have insomnia.   All other systems reviewed and are negative.  Performance status (ECOG): 0  Vitals There were no vitals taken for this visit.   Physical Exam  Constitutional: She is oriented to person, place, and time. She appears well-developed and well-nourished.  HENT:  Head: Normocephalic and atraumatic.  Short styled gray hair.  Eyes: Conjunctivae and EOM are normal. No scleral icterus.  Brown eyes.  Neurological: She is alert and oriented to person, place, and time. She has normal reflexes.  Psychiatric: She has a normal mood and affect. Her behavior is normal. Judgment and thought content normal.  Nursing note reviewed.   No visits with results within 3 Day(s) from this visit.  Latest known visit with results is:  Appointment on 01/25/2019  Component Date Value Ref Range Status  . Cholesterol 01/25/2019 167  0 - 200 mg/dL Final  .  Triglycerides 01/25/2019 161* <150 mg/dL Final  . HDL 01/25/2019 42  >40 mg/dL Final  . Total CHOL/HDL Ratio 01/25/2019 4.0  RATIO Final  . VLDL 01/25/2019 32  0 - 40 mg/dL Final  . LDL Cholesterol 01/25/2019 93  0 - 99 mg/dL Final   Comment:        Total Cholesterol/HDL:CHD Risk Coronary Heart Disease Risk Table                     Men   Women  1/2 Average Risk   3.4   3.3  Average Risk       5.0   4.4  2 X Average Risk   9.6   7.1  3 X Average Risk  23.4   11.0        Use the calculated Patient Ratio above and the CHD Risk Table to determine the patient's CHD Risk.        ATP III CLASSIFICATION (LDL):  <100     mg/dL   Optimal  100-129  mg/dL   Near or Above                    Optimal  130-159  mg/dL   Borderline  160-189  mg/dL   High  >190     mg/dL   Very High Performed at [facility], [address], [place], [place] [address]   . Sodium 01/25/2019 138  135 - 145 mmol/L Final  . Potassium 01/25/2019 4.1  3.5 - 5.1 mmol/L Final  . Chloride 01/25/2019 100  98 -  111 mmol/L Final  . CO2 01/25/2019 28  22 - 32 mmol/L Final  . Glucose, Bld 01/25/2019 126* 70 - 99 mg/dL Final  . BUN 01/25/2019 27* 8 - 23 mg/dL Final  . Creatinine, Ser 01/25/2019 1.00  0.44 - 1.00 mg/dL Final  . Calcium 01/25/2019 9.8  8.9 - 10.3 mg/dL Final  . Total Protein 01/25/2019 7.4  6.5 - 8.1 g/dL Final  . Albumin 01/25/2019 3.8  3.5 - 5.0 g/dL Final  . AST 01/25/2019 21  15 - 41 U/L Final  . ALT 01/25/2019 26  0 - 44 U/L Final  . Alkaline Phosphatase 01/25/2019 102  38 - 126 U/L Final  . Total Bilirubin 01/25/2019 0.8  0.3 - 1.2 mg/dL Final  . GFR calc non Af Amer 01/25/2019 55* >60 mL/min Final  . GFR calc Af Amer 01/25/2019 >60  >60 mL/min Final  . Anion gap 01/25/2019 10  5 - 15 Final   Performed at [facility], [address], [place], [place] [address]  . CA 27.29 01/25/2019 11.9  0.0 - 38.6 U/mL Final   Comment: (NOTE) Siemens Centaur Immunochemiluminometric Methodology [facility]) Values obtained with different assay methods or kits cannot be used interchangeably. Results cannot be interpreted as absolute evidence of the presence or absence of malignant disease. Performed At: [facility] [place], [place] [id number] Rush Farmer MD LD:[id number]   . WBC 01/25/2019 10.6* 4.0 - 10.5 K/uL Final  . RBC 01/25/2019 4.51  3.87 - 5.11 MIL/uL Final  . Hemoglobin 01/25/2019 13.2  12.0 - 15.0 g/dL Final  . HCT 01/25/2019 39.8  36.0 - 46.0 % Final  . MCV 01/25/2019 88.2  80.0 - 100.0 fL Final  . MCH 01/25/2019 29.3  26.0 - 34.0 pg Final  . MCHC 01/25/2019 33.2  30.0 - 36.0 g/dL Final  . RDW 01/25/2019 13.2  11.5 - 15.5 % Final  . Platelets 01/25/2019 225  150 - 400 K/uL Final  . nRBC 01/25/2019 0.0  0.0 - 0.2 % Final  . Neutrophils Relative % 01/25/2019 73  % Final  . Neutro Abs 01/25/2019 7.7  1.7 - 7.7 K/uL Final  . Lymphocytes Relative 01/25/2019 17  % Final  . Lymphs Abs 01/25/2019 1.8  0.7 - 4.0 K/uL Final  . Monocytes Relative  01/25/2019 6  % Final  . Monocytes Absolute 01/25/2019 0.6  0.1 - 1.0 K/uL Final  . Eosinophils Relative 01/25/2019 3  % Final  . Eosinophils Absolute 01/25/2019 0.3  0.0 - 0.5 K/uL Final  . Basophils Relative 01/25/2019 1  % Final  . Basophils Absolute 01/25/2019 0.1  0.0 - 0.1 K/uL Final  . Immature Granulocytes 01/25/2019 0  % Final  . Abs Immature Granulocytes 01/25/2019 0.04  0.00 - 0.07 K/uL Final   Performed at [facility], [address], [place], [place] [address]    Assessment:  BYRON TIPPING is a 74 y.o. female with metastatic breast cancers/p biopsyon 05/11/2013. Pathology revealed invasive mammary carcinoma. Axillary lymph node was positive for metastatic disease. Tumor was ERpositive (>90%),PRpositive (80%)and HER-2/neu 2+ by IHC (equivocal by [facility]).Clinical stagewas T4dN1M1(stage IV).  Chest CTon 05/09/2013 revealed a 2.8 cm soft tissue nodule in the left axillary tail. There was bulky axillary adenopathy (largest 1.8 cm). There were prominent mediastinal lymph nodes (7.3 mm AP window; 7 mm and 8 mm anterior to the central right mainstem bronchus, 11 mm central right hilar lymph node). There was a small left pleural effusion. Bone scanon 05/29/2013 revealed no evidence of metastatic disease. Abdomen and pelvis CTon 05/31/2013 revealed no evidence of abdominal pelvic metastatic disease.  Shereceived 1 cycle of neoadjuvantTCH and Perjetabeginning05/2015.She then received 3 cycles ofAdriamycin and Cytoxan(AC)from08/14/2015-10/19/2013.  She underwent bilateral mastectomyon 11/19/2013.Left breast pathologyrevealed a 3.7 cm grade III invasive mammary carcinoma with extensive lymphovascular invasion within breast and skin. There was metastatic carcinoma in 17 of 17 lymph nodes. Right breast pathologyrevealed extensive lymphovascular invasion by metastatic mammary carcinoma. One lymph node was negative for malignancy. Pathologic  stagewas [id number] (stage IV disease).RepeatHER-2/neutesting by [facility] was equivocal.  She began Femara and Ibrancein 11/2013 (last cycle began 03/05/2018).  Chest, abdomen, and pelvis CTon 12/01/2017 revealed no evidence of metastatic disease.  Chest, abdomen, and pelvis CT on 09/07/2018 revealed no acute intrathoracic, abdominal, or pelvis pathology.  There was no evidence of metastatic disease.  There were multiple uterine fibroids. A 17 x 11 mm lesion in the region of the DB trauma, likely a submucosal or intracavitary fibroid.  A polyp was not excluded. Pelvic ultrasound may provide better evaluation on a non-emergent basis. There was colonic diverticulosis. There was no bowel obstruction or active inflammation, and a normal appendix.   CA27.29 has been followed: 60.3 on 05/18/2013, < 3.5 on 11/29/2013, 19.6 on 04/18/2014, 22.9 on 05/16/2014, 16.9 on 08/12/2014, 12.2 on 10/14/2014, 11.9 on 01/06/2015, 11.5 on 04/11/2015, 17.3 on 09/09/2015, 15.7 on 11/19/2015, 15.3 on 02/24/2016, 17.5 on 05/21/2016, 17.1 on 08/24/2016, 17.2 on 11/23/2016, 12.6 on 02/21/2017, 13.2 on 05/24/2017, 18.0 on 07/26/2017, 10.7 on 10/04/2017, 18.0 on 12/01/2017, 12.1 on 02/07/2018, 13.1 on 02/28/2018, 9.0 on 09/07/2018, and 11.9 on 01/25/2019.  She had a STEMIon 11/03/2015. Cardiac cath revealed 100% occlusion of the proximal left circumflex artery. She was treated at [facility] with primary PCI with drug eluting stent (DES) and tirofiban. Myocardial perfusion SPECT on 03/06/2018 revealed an EF of  59%, normal wall motion, and homogeneous tracer distribution throughout the myocardium.  She has osteoarthritis of her knees.She underwent left knee replacementon 03/28/2018 and right knee replacementon 08/01/2018.   Bone density on 09/26/2018 revealed osteopenia with a T score of -1.8 in the AP spine L1-L4.  Symptomatically, she feels wonderful.  She denies any breast concerns.  Plan: 1.   Review labs  from 01/25/2019. 2.Metastatic breast cancer Patient hasbeen off [name] and Femara since prior to 03/28/2018. Discuss patient's thoughts about restarting Femara and Ibrance.                         Patient declines. Chest abdomen and pelvic CT on 09/07/2018 revealed no evidence of metastatic disease.  Discuss consideration of BCI testing.   Assuming 2 separate breast cancers.   Discuss benefit of extended adjuvant therapy (5 vs 10 years).   RN to send patient information on BCI testing.             Continue ongoing surveillance. 3.Osteoarthritis of the knees Patient is s/p bilateral knee replacement. Clinically, she is doing well.  Patient had previously declined reinitiation of Femara. 4.Osteopenia Bone density on 09/26/2018 confirmed osteopenia.               Continue calcium and vitamin D.   5.Weight loss             Patient notes weight is stable.             Continue to monitor. 6.Fibroids             CT imaging reveals multiple uterine fibroids and a 17 x 11 mm lesion in the region of the DB trauma (?), likely a submucosal or intracavitary fibroid.               Discuss consideration of pelvic ultrasound- patient declines. 7.RTC in 3 months for MD assessment and labs (CBC with diff, CMP, CA 27.29).  I discussed the assessment and treatment plan with the patient.  The patient was provided an opportunity to ask questions and all were answered.  The patient agreed with the plan and demonstrated an understanding of the instructions.  The patient was advised to call back if the symptoms worsen or if the condition fails to improve as anticipated.  I provided 14 minutes (8:44 AM - 8:58 AM) of face-to-face time video time during this this encounter and > 50% was spent counseling as documented under my assessment and plan.    Lequita Asal, MD, PhD    01/29/2019, 8:44 AM

## 2019-01-29 ENCOUNTER — Inpatient Hospital Stay: Payer: Medicare Other

## 2019-01-29 ENCOUNTER — Inpatient Hospital Stay (HOSPITAL_BASED_OUTPATIENT_CLINIC_OR_DEPARTMENT_OTHER): Payer: Medicare Other | Admitting: Hematology and Oncology

## 2019-01-29 ENCOUNTER — Encounter: Payer: Self-pay | Admitting: Hematology and Oncology

## 2019-01-29 DIAGNOSIS — M8588 Other specified disorders of bone density and structure, other site: Secondary | ICD-10-CM

## 2019-01-29 DIAGNOSIS — C50812 Malignant neoplasm of overlapping sites of left female breast: Secondary | ICD-10-CM | POA: Diagnosis not present

## 2019-01-29 DIAGNOSIS — Z170 Estrogen receptor positive status [ER+]: Secondary | ICD-10-CM | POA: Diagnosis not present

## 2019-02-19 ENCOUNTER — Telehealth: Payer: Self-pay

## 2019-02-19 NOTE — Telephone Encounter (Signed)
I tried to reach out to the patient unable to get a answer. I was unable to leave message /  mail box full.

## 2019-04-26 NOTE — Progress Notes (Signed)
[facility]  [address] [place], [place] [address] Phone: [phone]  Fax: [phone]   Clinic Day:  04/30/2019  Referring physician: No ref. provider found  Chief Complaint: Faith [name] is a 74 y.o. female with metastatic breast cancerwho is seen for a 3 month assessment.   HPI: The patient was last seen in the medical oncology clinic on 01/29/2019 via mychart.  At that time, she felt wonderful. She denied any breast concerns. Hematocrit was 39.8, hemoglobin 13.2, platelets 225,000, and WBC 10,600.  CMP was normal.  CA 27.29 was 11.9. She continued calcium and vitamin D.   She had a follow up with Dr. Rudene Christians on 03/16/2019. Her bilateral knees were stable s/p total knee replacement. Patient will follow up as needed.   During the interim, the patient felt "great". She has no fevers or sweats. She has no lumps or bumps. She denies any abdominal, urinary or neurologic symptoms. She is doing everything she wants to do. She has no breast concerns. She is performing monthly breast exams.    Past Medical History:  Diagnosis Date  . Arthritis    bilateral knees  . Cancer [facility]) April 2015   left breast  . Coronary artery disease   . Hyperlipidemia   . Hypertension   . Myocardial infarction ([place]) 11/03/2015   [facility], stent placed  . Pneumonia     Past Surgical History:  Procedure Laterality Date  . BREAST SURGERY Bilateral 11/19/13   bilater mastectomy   . CARDIAC CATHETERIZATION    . CORONARY ANGIOPLASTY    . PORT-A-CATH REMOVAL  2016  . PORTACATH PLACEMENT  06-07-13  . SHOULDER OPEN ROTATOR CUFF REPAIR  2012  . TOTAL KNEE ARTHROPLASTY Left 03/28/2018   Procedure: TOTAL KNEE ARTHROPLASTY-LEFT;  Surgeon: Hessie Knows, MD;  Location: [facility] ORS;  Service: Orthopedics;  Laterality: Left;  . TOTAL KNEE ARTHROPLASTY Right 08/01/2018   Procedure: RIGHT TOTAL KNEE ARTHROPLASTY;  Surgeon: Hessie Knows, MD;  Location: [facility] ORS;  Service: Orthopedics;   Laterality: Right;  . WRIST SURGERY Left 1969   cyst    Family History  Adopted: Yes    Social History:  reports that she quit smoking about 33 years ago. Her smoking use included cigarettes. She has a 15.00 pack-year smoking history. She has never used smokeless tobacco. She reports current alcohol use. She reports that she does not use drugs. She is retired; she previously drove a truck. She is from [place]. The patient is alone today.  Allergies:  Allergies  Allergen Reactions  . Mushroom Extract Complex Hives and Swelling  . Nickel Rash  . Shellfish Allergy Nausea And Vomiting    Current Medications: Current Outpatient Medications  Medication Sig Dispense Refill  . Ascorbic Acid (VITAMIN C) 1000 MG tablet Take 1,000 mg by mouth daily.    Marland Kitchen aspirin EC 81 MG tablet Take 81 mg by mouth daily.    Marland Kitchen atorvastatin (LIPITOR) 80 MG tablet Take 80 mg by mouth daily at 6 PM.     . Biotin 5 MG CAPS Take 5 mg by mouth 2 (two) times a day.    . Calcium Carb-Cholecalciferol (CALCIUM 600+D3 PO) Take 1 tablet by mouth 2 (two) times a day.    . Carboxymethylcellul-Glycerin (REFRESH OPTIVE) 1-0.9 % GEL Place 1 drop into both eyes at bedtime.    . cetirizine (ZYRTEC) 10 MG tablet Take 10 mg by mouth daily.    . Cholecalciferol (VITAMIN D3) 50 MCG (2000 UT)  TABS Take 2,000 Units by mouth 2 (two) times a day.    . Coenzyme Q10 (COQ10) 100 MG CAPS Take 100 mg by mouth daily.    Marland Kitchen losartan (COZAAR) 50 MG tablet Take 50 mg by mouth daily.     . metoprolol tartrate (LOPRESSOR) 25 MG tablet Take 25 mg by mouth 2 (two) times daily.     . nitroGLYCERIN (NITROSTAT) 0.4 MG SL tablet Place 0.4 mg under the tongue every 5 (five) minutes as needed for chest pain.      No current facility-administered medications for this visit.   Facility-Administered Medications Ordered in Other Visits  Medication Dose Route Frequency Provider Last Rate Last Admin  . sodium chloride 0.9 % injection 10 mL  10 mL Intravenous  PRN Choksi, Janak, MD   10 mL at 09/12/14 1014    Review of Systems  Constitutional: Negative for chills, diaphoresis, fever, malaise/fatigue and weight loss (stable).       Feels "great".  HENT: Negative.  Negative for congestion, hearing loss, nosebleeds, sinus pain and sore throat.   Eyes: Negative.  Negative for blurred vision and double vision.  Respiratory: Negative.  Negative for cough, sputum production, shortness of breath and wheezing.   Cardiovascular: Negative.  Negative for chest pain, palpitations, orthopnea, leg swelling and PND.       S/p MI 10/2015.   Gastrointestinal: Negative.  Negative for abdominal pain, blood in stool, constipation, diarrhea, melena, nausea and vomiting.  Genitourinary: Negative.  Negative for dysuria, frequency, hematuria and urgency.  Musculoskeletal: Negative for back pain, joint pain and myalgias.       S/p bilateral knee replacement.  Skin: Negative.  Negative for rash.  Neurological: Negative.  Negative for dizziness, tingling, sensory change, speech change, focal weakness, weakness and headaches.  Endo/Heme/Allergies: Negative.  Does not bruise/bleed easily.  Psychiatric/Behavioral: Negative.  Negative for depression and memory loss. The patient is not nervous/anxious and does not have insomnia.   All other systems reviewed and are negative.  Performance status (ECOG): 0  Vitals Blood pressure (!) 151/74, pulse 74, temperature (!) 97.3 F (36.3 C), temperature source Tympanic, resp. rate 18, weight 222 lb 10.6 oz (101 kg), SpO2 99 %.   Physical Exam Vitals and nursing note reviewed.  Constitutional:      General: She is not in acute distress.    Appearance: She is well-developed and well-nourished. She is not diaphoretic.  HENT:     Head: Normocephalic and atraumatic.     Mouth/Throat:     Mouth: Oropharynx is clear and moist.     Pharynx: No oropharyngeal exudate.      Comments: Short styled gray hair. Wearing a face shield. Eyes:      General: No scleral icterus.    Extraocular Movements: EOM normal.     Conjunctiva/sclera: Conjunctivae normal.     Pupils: Pupils are equal, round, and reactive to light.     Comments: Hazel/brown eyes.  Neck:     Vascular: No JVD.  Cardiovascular:     Rate and Rhythm: Normal rate and regular rhythm.     Heart sounds: Normal heart sounds. No murmur heard.   Pulmonary:     Effort: Pulmonary effort is normal. No respiratory distress.     Breath sounds: Normal breath sounds. No wheezing or rales.     Comments: S/p bilateral mastectomy without erythema or nodularity. Chest:     Chest wall: No tenderness.     Breasts:  Right: Skin change: linear keloid s/p incision.   Abdominal:     General: Bowel sounds are normal. There is no distension.     Palpations: Abdomen is soft. There is no mass.     Tenderness: There is no abdominal tenderness. There is no guarding or rebound.  Musculoskeletal:        General: No tenderness or edema. Normal range of motion.     Cervical back: Normal range of motion and neck supple.  Lymphadenopathy:     Cervical: No cervical adenopathy.     Upper Body:  No axillary adenopathy present.    Right upper body: No supraclavicular adenopathy.     Left upper body: No supraclavicular adenopathy.  Skin:    General: Skin is warm and dry.  Neurological:     Mental Status: She is alert and oriented to person, place, and time.     Deep Tendon Reflexes: Reflexes are normal and symmetric.     Comments: Chronic lower extremity changes.  Psychiatric:        Mood and Affect: Mood and affect normal.        Behavior: Behavior normal.        Thought Content: Thought content normal.        Judgment: Judgment normal.    Appointment on 04/30/2019  Component Date Value Ref Range Status  . Sodium 04/30/2019 137  135 - 145 mmol/L Final  . Potassium 04/30/2019 4.2  3.5 - 5.1 mmol/L Final  . Chloride 04/30/2019 100  98 - 111 mmol/L Final  . CO2 04/30/2019 28  22  - 32 mmol/L Final  . Glucose, Bld 04/30/2019 109* 70 - 99 mg/dL Final   Glucose reference range applies only to samples taken after fasting for at least 8 hours.  . BUN 04/30/2019 16  8 - 23 mg/dL Final  . Creatinine, Ser 04/30/2019 0.90  0.44 - 1.00 mg/dL Final  . Calcium 04/30/2019 9.9  8.9 - 10.3 mg/dL Final  . Total Protein 04/30/2019 7.8  6.5 - 8.1 g/dL Final  . Albumin 04/30/2019 4.0  3.5 - 5.0 g/dL Final  . AST 04/30/2019 25  15 - 41 U/L Final  . ALT 04/30/2019 31  0 - 44 U/L Final  . Alkaline Phosphatase 04/30/2019 117  38 - 126 U/L Final  . Total Bilirubin 04/30/2019 0.8  0.3 - 1.2 mg/dL Final  . GFR calc non Af Amer 04/30/2019 >60  >60 mL/min Final  . GFR calc Af Amer 04/30/2019 >60  >60 mL/min Final  . Anion gap 04/30/2019 9  5 - 15 Final   Performed at [facility], [address] [place], [place] [address]  . WBC 04/30/2019 10.3  4.0 - 10.5 K/uL Final  . RBC 04/30/2019 4.69  3.87 - 5.11 MIL/uL Final  . Hemoglobin 04/30/2019 13.5  12.0 - 15.0 g/dL Final  . HCT 04/30/2019 40.8  36.0 - 46.0 % Final  . MCV 04/30/2019 87.0  80.0 - 100.0 fL Final  . MCH 04/30/2019 28.8  26.0 - 34.0 pg Final  . MCHC 04/30/2019 33.1  30.0 - 36.0 g/dL Final  . RDW 04/30/2019 13.7  11.5 - 15.5 % Final  . Platelets 04/30/2019 250  150 - 400 K/uL Final  . nRBC 04/30/2019 0.0  0.0 - 0.2 % Final  . Neutrophils Relative % 04/30/2019 74  % Final  . Neutro Abs 04/30/2019 7.5  1.7 - 7.7 K/uL Final  . Lymphocytes Relative 04/30/2019 18  %  Final  . Lymphs Abs 04/30/2019 1.8  0.7 - 4.0 K/uL Final  . Monocytes Relative 04/30/2019 6  % Final  . Monocytes Absolute 04/30/2019 0.6  0.1 - 1.0 K/uL Final  . Eosinophils Relative 04/30/2019 2  % Final  . Eosinophils Absolute 04/30/2019 0.2  0.0 - 0.5 K/uL Final  . Basophils Relative 04/30/2019 0  % Final  . Basophils Absolute 04/30/2019 0.0  0.0 - 0.1 K/uL Final  . Immature Granulocytes 04/30/2019 0  % Final  . Abs Immature Granulocytes 04/30/2019  0.04  0.00 - 0.07 K/uL Final   Performed at [facility], [address] [place], [place] [address]    Assessment:  Faith [name] is a 74 y.o. female with metastatic breast cancers/p biopsyon 05/11/2013. Pathology revealed invasive mammary carcinoma. Axillary lymph node was positive for metastatic disease. Tumor was ERpositive (>90%),PRpositive (80%)and HER-2/neu 2+ by IHC (equivocal by [facility]).Clinical stagewas T4dN1M1(stage IV).  Chest CTon 05/09/2013 revealed a 2.8 cm soft tissue nodule in the left axillary tail. There was bulky axillary adenopathy (largest 1.8 cm). There were prominent mediastinal lymph nodes (7.3 mm AP window; 7 mm and 8 mm anterior to the central right mainstem bronchus, 11 mm central right hilar lymph node). There was a small left pleural effusion. Bone scanon 05/29/2013 revealed no evidence of metastatic disease. Abdomen and pelvis CTon 05/31/2013 revealed no evidence of abdominal pelvic metastatic disease.  Shereceived 1 cycle of neoadjuvantTCH and Perjetabeginning05/2015.She then received 3 cycles ofAdriamycin and Cytoxan(AC)from08/14/2015-10/19/2013.  She underwent bilateral mastectomyon 11/19/2013.Left breast pathologyrevealed a 3.7 cm grade III invasive mammary carcinoma with extensive lymphovascular invasion within breast and skin. There was metastatic carcinoma in 17 of 17 lymph nodes. Right breast pathologyrevealed extensive lymphovascular invasion by metastatic mammary carcinoma. One lymph node was negative for malignancy. Pathologic stagewas ypT4bypN3aypM1 (stage IV disease).RepeatHER-2/neutesting by [facility] was equivocal.  She began Femara and Ibrancein 11/2013 (last cycle began 03/05/2018).  Chest, abdomen, and pelvis CTon 12/01/2017 revealed no evidence of metastatic disease.Chest, abdomen, and pelvis CTon 09/07/2018 revealed no acute intrathoracic, abdominal, or pelvis pathology. There was  noevidence of metastatic disease. There were multiple uterine fibroids. A 17 x 11 mm lesion in the region of the DB trauma, likely a submucosal or intracavitary fibroid. A polyp was not excluded. Pelvic ultrasound may provide better evaluation on a non-emergent basis. There was colonic diverticulosis. There was no bowel obstruction or active inflammation, and a normal appendix.  CA27.29 has been followed: 60.3 on 05/18/2013, < 3.5 on 11/29/2013, 19.6 on 04/18/2014, 22.9 on 05/16/2014, 16.9 on 08/12/2014, 12.2 on 10/14/2014, 11.9 on 01/06/2015, 11.5 on 04/11/2015, 17.3 on 09/09/2015, 15.7 on 11/19/2015, 15.3 on 02/24/2016, 17.5 on 05/21/2016, 17.1 on 08/24/2016, 17.2 on 11/23/2016, 12.6 on 02/21/2017, 13.2 on 05/24/2017, 18.0 on 07/26/2017, 10.7 on 10/04/2017, 18.0 on 12/01/2017, 12.1 on 02/07/2018, 13.1 on 02/28/2018, 9.0 on 09/07/2018, and 11.9 on 01/25/2019.  She had a STEMIon 11/03/2015. Cardiac cath revealed 100% occlusion of the proximal left circumflex artery. She was [facility] at [facility] with primary PCI with drug eluting stent (DES) and tirofiban. Myocardial perfusion SPECT on 03/06/2018 revealed an EF of 59%, normal wall motion, and homogeneous tracer distribution throughout the myocardium.  She has osteoarthritis of her knees.She underwent left knee replacementon 03/10/2020andright knee replacementon 08/01/2018.  Bone densityon 09/26/2018 revealed osteopeniawith a T score of -1.8 in the AP spine L1-L4.  Symptomatically, she feels great.  She denies any concerns.  Plan: 1.   Labs today: CBC with diff, CMP, CA 27.29.  2.Metastatic breast cancer Clinically, she is doing well without evidence of recurrent disease.  She has been off [place] and Femara since prior to 03/28/2018. She remains off treatment. Chest abdomen and pelvic CT on 09/07/2018 revealed no evidence of metastatic disease.             Previously discussed consideration of  BCI testing.                         Assuming 2 separate breast cancers.                         Benefit of extended adjuvant therapy (5 vs 10 years).                         Information was sent on BCI testing. Continue surveillance. 3.Osteoarthritis of the knees Patientiss/pbilateral knee replacement. Clinically, she is doing well. 4.Osteopenia Bone density on 09/26/2018 confirmed osteopenia. Continue calcium and vitamin D.  5.Weight loss Weight remains stable. Continue to monitor. 6.Fibroids CT imaging reveals multiple uterine fibroidsand a17 x 11 mm lesion in the region of the DB trauma (?), likely a submucosal or intracavitary fibroid. Patient previously declined pelvic ultrasound. 7.RTC in 4 months for MD assessment and labs (CBC with diff, CMP, CA 27.29).  I discussed the assessment and treatment plan with the patient.  The patient was provided an opportunity to ask questions and all were answered.  The patient agreed with the plan and demonstrated an understanding of the instructions.  The patient was advised to call back if the symptoms worsen or if the condition fails to improve as anticipated.   Lequita Asal, MD, PhD    04/30/2019, 3:06 PM  I, Selena Batten, am acting as scribe for [employer]. Mike Gip, MD, PhD.  I,  C. Mike Gip, MD, have reviewed the above documentation for accuracy and completeness, and I agree with the above.

## 2019-04-30 ENCOUNTER — Other Ambulatory Visit: Payer: Self-pay

## 2019-04-30 ENCOUNTER — Encounter: Payer: Self-pay | Admitting: Hematology and Oncology

## 2019-04-30 ENCOUNTER — Inpatient Hospital Stay: Payer: Medicare Other | Attending: Hematology and Oncology | Admitting: Hematology and Oncology

## 2019-04-30 ENCOUNTER — Inpatient Hospital Stay: Payer: Medicare Other

## 2019-04-30 VITALS — BP 151/74 | HR 74 | Temp 97.3°F | Resp 18 | Wt 222.7 lb

## 2019-04-30 DIAGNOSIS — M8588 Other specified disorders of bone density and structure, other site: Secondary | ICD-10-CM

## 2019-04-30 DIAGNOSIS — Z853 Personal history of malignant neoplasm of breast: Secondary | ICD-10-CM | POA: Insufficient documentation

## 2019-04-30 DIAGNOSIS — F17210 Nicotine dependence, cigarettes, uncomplicated: Secondary | ICD-10-CM | POA: Insufficient documentation

## 2019-04-30 DIAGNOSIS — C50812 Malignant neoplasm of overlapping sites of left female breast: Secondary | ICD-10-CM

## 2019-04-30 DIAGNOSIS — D259 Leiomyoma of uterus, unspecified: Secondary | ICD-10-CM | POA: Diagnosis not present

## 2019-04-30 DIAGNOSIS — Z96653 Presence of artificial knee joint, bilateral: Secondary | ICD-10-CM | POA: Diagnosis not present

## 2019-04-30 DIAGNOSIS — Z79899 Other long term (current) drug therapy: Secondary | ICD-10-CM | POA: Diagnosis not present

## 2019-04-30 DIAGNOSIS — I252 Old myocardial infarction: Secondary | ICD-10-CM | POA: Diagnosis not present

## 2019-04-30 DIAGNOSIS — R634 Abnormal weight loss: Secondary | ICD-10-CM | POA: Diagnosis not present

## 2019-04-30 DIAGNOSIS — I10 Essential (primary) hypertension: Secondary | ICD-10-CM | POA: Diagnosis not present

## 2019-04-30 DIAGNOSIS — Z170 Estrogen receptor positive status [ER+]: Secondary | ICD-10-CM

## 2019-04-30 DIAGNOSIS — M8580 Other specified disorders of bone density and structure, unspecified site: Secondary | ICD-10-CM | POA: Diagnosis not present

## 2019-04-30 DIAGNOSIS — I2510 Atherosclerotic heart disease of native coronary artery without angina pectoris: Secondary | ICD-10-CM | POA: Insufficient documentation

## 2019-04-30 DIAGNOSIS — E785 Hyperlipidemia, unspecified: Secondary | ICD-10-CM | POA: Diagnosis not present

## 2019-04-30 DIAGNOSIS — Z7982 Long term (current) use of aspirin: Secondary | ICD-10-CM | POA: Insufficient documentation

## 2019-04-30 LAB — CBC WITH DIFFERENTIAL/PLATELET
Abs Immature Granulocytes: 0.04 10*3/uL (ref 0.00–0.07)
Basophils Absolute: 0 10*3/uL (ref 0.0–0.1)
Basophils Relative: 0 %
Eosinophils Absolute: 0.2 10*3/uL (ref 0.0–0.5)
Eosinophils Relative: 2 %
HCT: 40.8 % (ref 36.0–46.0)
Hemoglobin: 13.5 g/dL (ref 12.0–15.0)
Immature Granulocytes: 0 %
Lymphocytes Relative: 18 %
Lymphs Abs: 1.8 10*3/uL (ref 0.7–4.0)
MCH: 28.8 pg (ref 26.0–34.0)
MCHC: 33.1 g/dL (ref 30.0–36.0)
MCV: 87 fL (ref 80.0–100.0)
Monocytes Absolute: 0.6 10*3/uL (ref 0.1–1.0)
Monocytes Relative: 6 %
Neutro Abs: 7.5 10*3/uL (ref 1.7–7.7)
Neutrophils Relative %: 74 %
Platelets: 250 10*3/uL (ref 150–400)
RBC: 4.69 MIL/uL (ref 3.87–5.11)
RDW: 13.7 % (ref 11.5–15.5)
WBC: 10.3 10*3/uL (ref 4.0–10.5)
nRBC: 0 % (ref 0.0–0.2)

## 2019-04-30 LAB — COMPREHENSIVE METABOLIC PANEL
ALT: 31 U/L (ref 0–44)
AST: 25 U/L (ref 15–41)
Albumin: 4 g/dL (ref 3.5–5.0)
Alkaline Phosphatase: 117 U/L (ref 38–126)
Anion gap: 9 (ref 5–15)
BUN: 16 mg/dL (ref 8–23)
CO2: 28 mmol/L (ref 22–32)
Calcium: 9.9 mg/dL (ref 8.9–10.3)
Chloride: 100 mmol/L (ref 98–111)
Creatinine, Ser: 0.9 mg/dL (ref 0.44–1.00)
GFR calc Af Amer: >60 mL/min (ref >60)
GFR calc non Af Amer: >60 mL/min (ref >60)
Glucose, Bld: 109 mg/dL — ABNORMAL HIGH (ref 70–99)
Potassium: 4.2 mmol/L (ref 3.5–5.1)
Sodium: 137 mmol/L (ref 135–145)
Total Bilirubin: 0.8 mg/dL (ref 0.3–1.2)
Total Protein: 7.8 g/dL (ref 6.5–8.1)

## 2019-04-30 NOTE — Progress Notes (Signed)
Patient here for follow up. Denies any concerns.  

## 2019-05-01 LAB — CANCER ANTIGEN 27.29: CA 27.29: 35.2 U/mL (ref 0.0–38.6)

## 2019-08-10 ENCOUNTER — Encounter: Payer: Self-pay | Admitting: *Deleted

## 2019-08-10 ENCOUNTER — Encounter: Payer: Medicare Other | Attending: Infectious Diseases | Admitting: *Deleted

## 2019-08-10 ENCOUNTER — Other Ambulatory Visit: Payer: Self-pay

## 2019-08-10 VITALS — BP 126/64 | Ht 67.0 in | Wt 203.7 lb

## 2019-08-10 DIAGNOSIS — E0965 Drug or chemical induced diabetes mellitus with hyperglycemia: Secondary | ICD-10-CM | POA: Diagnosis not present

## 2019-08-10 DIAGNOSIS — Z794 Long term (current) use of insulin: Secondary | ICD-10-CM | POA: Diagnosis not present

## 2019-08-10 DIAGNOSIS — T380X5A Adverse effect of glucocorticoids and synthetic analogues, initial encounter: Secondary | ICD-10-CM | POA: Insufficient documentation

## 2019-08-10 DIAGNOSIS — E119 Type 2 diabetes mellitus without complications: Secondary | ICD-10-CM

## 2019-08-10 NOTE — Progress Notes (Signed)
Diabetes Self-Management Education  Visit Type: First/Initial  Appt. Start Time: 1340 Appt. End Time: 1500  08/10/2019  [name], identified by name and date of birth, is a 74 y.o. female with a diagnosis of Diabetes: Type 2.   ASSESSMENT  Blood pressure (!) 126/64, height 5\' 7"  (1.702 m), weight (!) 203 lb 11.2 oz (92.4 kg). Body mass index is 31.9 kg/m.   Diabetes Self-Management Education - 08/10/19 1533      Visit Information   Visit Type First/Initial      Initial Visit   Diabetes Type Type 2    Are you currently following a meal plan? Yes    What type of meal plan do you follow? "low carb and low sugar"    Are you taking your medications as prescribed? No   stopped her insulin due to hypoglycemia and normal readings; reports that she sent a note to her endocrinologist via My Chart   Date Diagnosed 1 month ago - due to high dose of steroids      Health Coping   How would you rate your overall health? Good      Psychosocial Assessment   Patient Belief/Attitude about Diabetes Motivated to manage diabetes   "no problem"   Self-care barriers None    Self-management support Doctor's office;Friends;Family    Patient Concerns Nutrition/Meal planning;Medication;Weight Control;Healthy Lifestyle    Special Needs None    Preferred Learning Style Auditory    Learning Readiness Change in progress    How often do you need to have someone help you when you read instructions, pamphlets, or other written materials from your doctor or pharmacy? 1 - Never    What is the last grade level you completed in school? BA      Pre-Education Assessment   Patient understands the diabetes disease and treatment process. Needs Instruction    Patient understands incorporating nutritional management into lifestyle. Needs Instruction    Patient undertands incorporating physical activity into lifestyle. Needs Review    Patient understands using medications safely. Needs Review    Patient  understands monitoring blood glucose, interpreting and using results Needs Review    Patient understands prevention, detection, and treatment of acute complications. Needs Instruction    Patient understands prevention, detection, and treatment of chronic complications. Needs Review    Patient understands how to develop strategies to address psychosocial issues. Needs Instruction    Patient understands how to develop strategies to promote health/change behavior. Needs Instruction      Complications   Last HgB A1C per patient/outside source 10.9 %   07/16/2019   How often do you check your blood sugar? > 4 times/day   Pt had a Libre but it came off. She is waiting on insurance to approve more sensors. She is currently using a glucometer from rehab but is running out of strips. Provided One Touch Verio Flex meter, instructed on use. BG 161 mg/dL - 2:50 pm - 2 hrs pp   Fasting Blood glucose range (mg/dL) <70;70-129   FBG's 84, 49 and 88 mg/dL for last 3 days without insulin. Reading before lunch 150, 156 and 131 mg/dL. Reading before supper 121, 100 and 123 mg/dL.   Postprandial Blood glucose range (mg/dL) 70-129;130-179;>200   pp's breakfast 116, 147, 223 mg/dL; pp lunch 261, 153, 161 mg/dL, pp supper 93 and 104 mg/dL.   Number of hypoglycemic episodes per month 3    Can you tell when your blood sugar is low? Yes  What do you do if your blood sugar is low? "got some orange juice"    Number of hyperglycemic episodes per week 2   since she stopped taking her insulin - related to food choices   Can you tell when your blood sugar is high? No    Have you had a dilated eye exam in the past 12 months? Yes    Have you had a dental exam in the past 12 months? No   dentures   Are you checking your feet? Yes    How many days per week are you checking your feet? 7      Dietary Intake   Breakfast ham and eggs; cereal; oatmeal    Snack (morning) doesnt' usually snack - may have celery and cream cheese or  peanut butter, cuccumbers    Lunch sometimes "breakfast" - eggs and ham; sweet potatoes with meat and non-starchy veggies; cottage cheese and fruit (orange, pear, peach, applesauce); [place] and cheese wrap    Dinner pork, beef, chicken, fish with sweet potatoes, green beans, broccoli, tomatoes, corn, salads, fruit    Beverage(s) milk, water, coffee with sugar, juice if treating a low blood sugar      Exercise   Exercise Type Light (walking / raking leaves)    How many days per week to you exercise? 5    How many minutes per day do you exercise? 30    Total minutes per week of exercise 150      Patient Education   Previous Diabetes Education No    Disease state  Definition of diabetes, type 1 and 2, and the diagnosis of diabetes;Factors that contribute to the development of diabetes    Nutrition management  Role of diet in the treatment of diabetes and the relationship between the three main macronutrients and blood glucose level;Food label reading, portion sizes and measuring food.;Reviewed blood glucose goals for pre and post meals and how to evaluate the patients' food intake on their blood glucose level.;Meal timing in regards to the patients' current diabetes medication.    Physical activity and exercise  Role of exercise on diabetes management, blood pressure control and cardiac health.    Medications Taught/reviewed insulin injection, site rotation, insulin storage and needle disposal.;Reviewed patients medication for diabetes, action, purpose, timing of dose and side effects.    Monitoring Taught/evaluated SMBG meter.;Purpose and frequency of SMBG.;Taught/discussed recording of test results and interpretation of SMBG.;Identified appropriate SMBG and/or A1C goals.    Acute complications Taught treatment of hypoglycemia - the 15 rule.    Chronic complications Relationship between chronic complications and blood glucose control    Psychosocial adjustment Identified and addressed patients  feelings and concerns about diabetes      Individualized Goals (developed by patient)   Reducing Risk Other (comment)   decrease medications, lose weight, lead a healthier lifestyle     Outcomes   Expected Outcomes Demonstrated interest in learning. Expect positive outcomes           Individualized Plan for Diabetes Self-Management Training:   Learning Objective:  Patient will have a greater understanding of diabetes self-management. Patient education plan is to attend individual and/or group sessions per assessed needs and concerns.   Plan:   Patient Instructions  Check blood sugars 2 x day before breakfast and before supper every day (until supplies arrive for CGM) and prn for symptoms of high or low blood sugars Bring blood sugar records to the next class Call your doctor for a  prescription for:  1. Meter strips (type) One Touch Verio  checking  2 times per day  2. Lancets (type) One Touch Delica Plus checking  2     times per day Exercise: Walk  for  30  minutes 5  days a week Eat 3 meals day,1-2 snacks a day Space meals 4-6 hours apart Avoid sugar sweetened drinks (coffee) Carry fast acting glucose and a snack at all times Rotate injection sites  Expected Outcomes:  Demonstrated interest in learning. Expect positive outcomes  Education material provided:  General Meal Planning Guidelines Simple Meal Plan Meter - One Touch Verio Flex Glucose tablets Symptoms, causes and treatments of Hypoglycemia  If problems or questions, patient to contact team via:  Faith Drilling, RN, [place], [place] [phone]  Future DSME appointment:  Pt will call back after she sees endocrinologist.

## 2019-08-10 NOTE — Patient Instructions (Signed)
Check blood sugars 2 x day before breakfast and before supper every day (until supplies arrive for CGM) and prn for symptoms of high or low blood sugars Bring blood sugar records to the next class  Call your doctor for a prescription for:  1. Meter strips (type) One Touch Verio  checking  2 times per day  2. Lancets (type) One Touch Delica Plus checking  2     times per day  Exercise: Walk  for  30  minutes 5  days a week  Eat 3 meals day,1-2 snacks a day Space meals 4-6 hours apart Avoid sugar sweetened drinks (coffee)  Carry fast acting glucose and a snack at all times Rotate injection sites  Return for classes on:

## 2019-08-30 ENCOUNTER — Other Ambulatory Visit: Payer: Medicare Other

## 2019-08-30 ENCOUNTER — Ambulatory Visit: Payer: Medicare Other | Admitting: Hematology and Oncology

## 2019-09-03 ENCOUNTER — Ambulatory Visit: Payer: Medicare Other | Admitting: Hematology and Oncology

## 2019-09-03 ENCOUNTER — Other Ambulatory Visit: Payer: Medicare Other

## 2019-09-05 NOTE — Progress Notes (Addendum)
[facility]  [address], [address] [place], [place] [address] Phone: [phone]  Fax: [phone]   Clinic Day:  09/06/2019  Referring physician: Leonel Ramsay, MD  Chief Complaint: Faith [name] is a 74 y.o. female with metastatic breast cancerwho is seen for a 4 month assessment.   HPI: The patient was last seen in the medical oncology clinic on 04/30/2019.  At that time, she felt great. She denied any concerns. Labs were normal. CA 27.29 was 35.2. Patient continued calcium and vitamin D. Surveillance continued.   She saw Dr. Marcelline Deist on 07/10/2019. 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 She continued prednisone 60 mg a day. She will start prednisone taper 1 week after her first Actemra injection. Actemra injections were planned weekly for a least 1 year.   She was admitted to [facility] from 07/16/2019 - 07/25/2019 with altered mental status in the setting of new hyperglycemia thought to be secondary to new start of prednisone and tocilizumab for treatment of giant cell arteritis.  She was noted to have evaded liver function test.  Hepatitis serologies were negative.  GGT was elevated suggesting intrinsic liver process.  No imaging was performed as prior imaging suggested fatty infiltration.  A hepatology consult was requested at time of discharge.  She saw Dr. Ola Spurr on 08/30/2019. She was noted to have a new onset diabetes mellitus and was started on insulin 45 AM and humalog at night. Hematocrit was 42.1, hemoglobin 13.6, platelets 221,000, WBC 11,5000 (ANC 10,150). Ferritin was >1500. Potassium 5.7. Creatinine 1.2. AST was 126, ALT 163, and alkaline phosphatase 654.  Hepatitis B surface antibody, hepatitis B surface antigen and hepatitis B core antibody were negative.  Hepatitis A total antibody was positive.  Repeat ferritin was 2235 with a sed rate of 30 on 09/05/2019.  She has had increasing  LFTs: 07/16/2019: AST   67.  ALT   68.  Alkaline phosphatase 147.  Bilirubin         Albumin 3.1. 07/17/2019: AST 105.  ALT   70.  Alkaline phosphatase 129.  Bilirubin 0.2   Albumin 2.6. 07/18/2019: AST 140.  ALT 107.  Alkaline phosphatase 142.  Bilirubin 0.2.  Albumin 2.3. 07/19/2019: AST   89.  ALT   88.  Alkaline phosphatase 120.  Bilirubin 1.3. 07/21/2019: AST 244.  ALT 259.  Alkaline phosphatase 389.  Bilirubin         Albumin 2.0.  07/22/2019: AST 170.  ALT 286.  Alkaline phosphatase 388.  Bilirubin         Albumin 1.9. 07/25/2019: AST 110.  ALT 236.  Alkaline phosphatase 430.  Bilirubin         Albumin 2.1.  09/05/2019: AST 188.  ALT 223.  Alkaline phosphatase 648.  Bilirubin         Albumin 3.4.  During the interim, the patient has been doing "pretty good". She reports having a right temporal artery biopsy on done on 06/20/2019 at [facility]. She reports taking Humalog if her blood sugar is > 200. Her blood sugar is under good control. She reports losing vision in her right eye. Her arthritis is stable.   The patient has lost 23 lbs since 04/30/2019. She denies unexpected weight loss. She continues to take calcium and vitamin D.   Patient is agreeable to abdominal CT to assess her elevated LFTs.  She reports that she is not allergic to iodine (contrast) with CT scans.  STAT Abdomen CT today revealed showed numerous (> 15) new confluent hypoenhancing liver masses scattered throughout the liver c/w liver metastases.  Representative lesions include a 5 x 4.8 cm lesion in the left liver lobe, 8.2 x 4.4 cm anterior inferior mass involving segments 5 and 4B, and 6.5 x 3.3 cm in segment 8 of the right liver.  There was new patchy indistinct small sclerotic foci throughout the visualized axial skeleton, compatible with osseous metastases. There was a new small right adrenal nodule, suspicious for adrenal metastasis. There was a small hiatal hernia, cholelithiasis, and moderate colonic  diverticulosis.  Patient agreed to a liver biopsy given CT results today.    Past Medical History:  Diagnosis Date  . Arthritis    bilateral knees  . Cancer [facility]) April 2015   left breast  . Coronary artery disease   . Diabetes mellitus without complication ([place])   . Hyperlipidemia   . Hypertension   . Myocardial infarction ([place]) 11/03/2015   [facility], stent placed  . Pneumonia     Past Surgical History:  Procedure Laterality Date  . BREAST SURGERY Bilateral 11/19/13   bilater mastectomy   . CARDIAC CATHETERIZATION    . CORONARY ANGIOPLASTY    . PORT-A-CATH REMOVAL  2016  . PORTACATH PLACEMENT  06-07-13  . SHOULDER OPEN ROTATOR CUFF REPAIR  2012  . TOTAL KNEE ARTHROPLASTY Left 03/28/2018   Procedure: TOTAL KNEE ARTHROPLASTY-LEFT;  Surgeon: Hessie Knows, MD;  Location: [facility] ORS;  Service: Orthopedics;  Laterality: Left;  . TOTAL KNEE ARTHROPLASTY Right 08/01/2018   Procedure: RIGHT TOTAL KNEE ARTHROPLASTY;  Surgeon: Hessie Knows, MD;  Location: [facility] ORS;  Service: Orthopedics;  Laterality: Right;  . WRIST SURGERY Left 1969   cyst    Family History  Adopted: Yes    Social History:  reports that she quit smoking about 34 years ago. Her smoking use included cigarettes. She has a 15.00 pack-year smoking history. She has never used smokeless tobacco. She reports current alcohol use. She reports that she does not use drugs. Denies family history of hemochromatosis. She is retired; she previously drove a truck. She is from [place]. The patient is alone today.  Allergies:  Allergies  Allergen Reactions  . Mushroom Extract Complex Hives and Swelling  . Nickel Rash  . Shellfish Allergy Nausea And Vomiting    Current Medications: Current Outpatient Medications  Medication Sig Dispense Refill  . Calcium Carbonate-Vitamin D 600-400 MG-UNIT tablet Take by mouth.    Marland Kitchen glucose blood (PRECISION QID TEST) test strip 1 each (1 strip total) by XX route 5 (five) times daily Use as  instructed.    Marland Kitchen alendronate (FOSAMAX) 70 MG tablet 70 mg once a week.     . Ascorbic Acid (VITAMIN C) 1000 MG tablet Take 1,000 mg by mouth daily.    Marland Kitchen aspirin EC 81 MG tablet Take 81 mg by mouth daily.    Marland Kitchen atorvastatin (LIPITOR) 80 MG tablet Take 80 mg by mouth daily at 6 PM.     . Biotin 5 MG CAPS Take 5 mg by mouth 2 (two) times a day.    . Calcium Carb-Cholecalciferol (CALCIUM 600+D3 PO) Take 1 tablet by mouth 2 (two) times a day.    . Carboxymethylcellul-Glycerin (REFRESH OPTIVE) 1-0.9 % GEL Place 1 drop into both eyes daily as needed.     . cetirizine (ZYRTEC) 10 MG tablet Take 10 mg by mouth daily as needed.     . Cholecalciferol 125 MCG (5000 UT)  TABS Take 1 capsule by mouth 2 (two) times daily.    . Coenzyme Q10 (COQ10) 100 MG CAPS Take 100 mg by mouth daily.    . insulin glargine (LANTUS) 100 UNIT/ML injection Inject 60 Units into the skin daily. (Patient not taking: Reported on 08/10/2019)    . insulin lispro (HUMALOG) 100 UNIT/ML KwikPen Inject 18 Units into the skin 3 (three) times daily before meals. (Patient not taking: Reported on 08/10/2019)    . losartan (COZAAR) 50 MG tablet Take 50 mg by mouth daily.     . metoprolol tartrate (LOPRESSOR) 25 MG tablet Take 25 mg by mouth 2 (two) times daily.     Glory Rosebush Delica Lancets 67M MISC Apply 1 each topically 3 (three) times daily.    . pantoprazole (PROTONIX) 40 MG tablet Take 40 mg by mouth daily.    . predniSONE (DELTASONE) 10 MG tablet Take 40-50 mg by mouth daily.    Marland Kitchen sulfamethoxazole-trimethoprim (BACTRIM DS) 800-160 MG tablet Take 1 tablet by mouth 3 (three) times a week.     No current facility-administered medications for this visit.   Facility-Administered Medications Ordered in Other Visits  Medication Dose Route Frequency Provider Last Rate Last Admin  . sodium chloride 0.9 % injection 10 mL  10 mL Intravenous PRN Choksi, Janak, MD   10 mL at 09/12/14 1014    Review of Systems  Constitutional: Positive for weight  loss (23 lbs since 04/30/2019; she describes a weight loss of 2 lbs/week secondary to a change in diet). Negative for chills, diaphoresis, fever and malaise/fatigue.       Feels "pretty good".  HENT: Negative.  Negative for congestion, hearing loss, nosebleeds, sinus pain and sore throat.   Eyes: Negative.  Negative for blurred vision and double vision.       Loss of vision in right eye.  Respiratory: Negative.  Negative for cough, sputum production, shortness of breath and wheezing.   Cardiovascular: Negative.  Negative for chest pain, palpitations, orthopnea, leg swelling and PND.       S/p MI 10/2015.   Gastrointestinal: Negative.  Negative for abdominal pain, blood in stool, constipation, diarrhea, melena, nausea and vomiting.  Genitourinary: Negative.  Negative for dysuria, frequency, hematuria and urgency.  Musculoskeletal: Negative for back pain, joint pain (arthritis, stable) and myalgias.       S/p bilateral knee replacement.  Skin: Negative.  Negative for rash.  Neurological: Negative.  Negative for dizziness, tingling, sensory change, speech change, focal weakness, weakness and headaches.  Endo/Heme/Allergies: Negative.  Does not bruise/bleed easily.       Blood sugar is under good control.  Psychiatric/Behavioral: Negative.  Negative for depression and memory loss. The patient is not nervous/anxious and does not have insomnia.   All other systems reviewed and are negative.  Performance status (ECOG): 0  Vitals Blood pressure (!) 124/45, pulse 68, temperature 97.8 F (36.6 C), resp. rate 19, weight 199 lb 11.8 oz (90.6 kg), SpO2 96 %.   Physical Exam Vitals and nursing note reviewed.  Constitutional:      General: She is not in acute distress.    Appearance: She is well-developed. She is not diaphoretic.  HENT:     Head: Normocephalic and atraumatic.     Comments: Long gray hair.    Mouth/Throat:     Pharynx: No oropharyngeal exudate.  Eyes:     General: No scleral  icterus.    Conjunctiva/sclera: Conjunctivae normal.     Pupils: Pupils are  equal, round, and reactive to light.     Comments: Glasses.  Hazel/brown eyes.  Neck:     Vascular: No JVD.  Cardiovascular:     Rate and Rhythm: Normal rate and regular rhythm.     Heart sounds: Normal heart sounds. No murmur heard.   Pulmonary:     Effort: Pulmonary effort is normal. No respiratory distress.     Breath sounds: Normal breath sounds. No wheezing or rales.  Chest:     Chest wall: No tenderness.     Breasts:        Right: Skin change (linear keloid s/p incision) present.      Comments: Bilateral mastectomies without erythema or nodularity. Abdominal:     General: Bowel sounds are normal. There is no distension.     Palpations: Abdomen is soft. There is no mass.     Tenderness: There is no abdominal tenderness. There is no guarding or rebound.  Musculoskeletal:        General: No tenderness. Normal range of motion.     Cervical back: Normal range of motion and neck supple.  Lymphadenopathy:     Cervical: No cervical adenopathy.     Upper Body:     Right upper body: No supraclavicular adenopathy.     Left upper body: No supraclavicular adenopathy.  Skin:    General: Skin is warm and dry.     Findings: Rash (folliculitis in axillae) present.  Neurological:     Mental Status: She is alert and oriented to person, place, and time.     Deep Tendon Reflexes: Reflexes are normal and symmetric.     Comments: Chronic lower extremity changes.  Psychiatric:        Behavior: Behavior normal.        Thought Content: Thought content normal.        Judgment: Judgment normal.    Appointment on 09/06/2019  Component Date Value Ref Range Status  . Bilirubin, Direct 09/06/2019 0.2  0.0 - 0.2 mg/dL Final   Performed at [facility], [address], [place], [place] [address]  . Sodium 09/06/2019 140  135 - 145 mmol/L Final  . Potassium 09/06/2019 4.2  3.5 - 5.1 mmol/L Final  . Chloride  09/06/2019 103  98 - 111 mmol/L Final  . CO2 09/06/2019 26  22 - 32 mmol/L Final  . Glucose, Bld 09/06/2019 102* 70 - 99 mg/dL Final   Glucose reference range applies only to samples taken after fasting for at least 8 hours.  . BUN 09/06/2019 27* 8 - 23 mg/dL Final  . Creatinine, Ser 09/06/2019 1.08* 0.44 - 1.00 mg/dL Final  . Calcium 09/06/2019 10.1  8.9 - 10.3 mg/dL Final  . Total Protein 09/06/2019 6.7  6.5 - 8.1 g/dL Final  . Albumin 09/06/2019 3.5  3.5 - 5.0 g/dL Final  . AST 09/06/2019 173* 15 - 41 U/L Final  . ALT 09/06/2019 197* 0 - 44 U/L Final  . Alkaline Phosphatase 09/06/2019 594* 38 - 126 U/L Final  . Total Bilirubin 09/06/2019 1.1  0.3 - 1.2 mg/dL Final  . GFR calc non Af Amer 09/06/2019 50* >60 mL/min Final  . GFR calc Af Amer 09/06/2019 58* >60 mL/min Final  . Anion gap 09/06/2019 11  5 - 15 Final   Performed at [facility], [address] [place],  [address]  . WBC 09/06/2019 10.6* 4.0 - 10.5 K/uL Final  . RBC 09/06/2019 4.59  3.87 - 5.11  MIL/uL Final  . Hemoglobin 09/06/2019 13.2  12.0 - 15.0 g/dL Final  . HCT 09/06/2019 39.9  36 - 46 % Final  . MCV 09/06/2019 86.9  80.0 - 100.0 fL Final  . MCH 09/06/2019 28.8  26.0 - 34.0 pg Final  . MCHC 09/06/2019 33.1  30.0 - 36.0 g/dL Final  . RDW 09/06/2019 15.4  11.5 - 15.5 % Final  . Platelets 09/06/2019 182  150 - 400 K/uL Final  . nRBC 09/06/2019 0.4* 0.0 - 0.2 % Final  . Neutrophils Relative % 09/06/2019 70  % Final  . Neutro Abs 09/06/2019 7.4  1.7 - 7.7 K/uL Final  . Lymphocytes Relative 09/06/2019 22  % Final  . Lymphs Abs 09/06/2019 2.3  0.7 - 4.0 K/uL Final  . Monocytes Relative 09/06/2019 6  % Final  . Monocytes Absolute 09/06/2019 0.6  0 - 1 K/uL Final  . Eosinophils Relative 09/06/2019 0  % Final  . Eosinophils Absolute 09/06/2019 0.0  0 - 0 K/uL Final  . Basophils Relative 09/06/2019 0  % Final  . Basophils Absolute 09/06/2019 0.0  0 - 0 K/uL Final  . Immature Granulocytes 09/06/2019 2   % Final  . Abs Immature Granulocytes 09/06/2019 0.18* 0.00 - 0.07 K/uL Final   Performed at [facility], [address], [place],  [address]    Assessment:  JUSTEEN HEHR is a 74 y.o. female with metastatic breast cancers/p biopsyon 05/11/2013. Pathology revealed invasive mammary carcinoma. Axillary lymph node was positive for metastatic disease. Tumor was ERpositive (>90%),PRpositive (80%)and HER-2/neu 2+ by IHC (equivocal by [facility]).Clinical stagewas T4dN1M1(stage IV).  Chest CTon 05/09/2013 revealed a 2.8 cm soft tissue nodule in the left axillary tail. There was bulky axillary adenopathy (largest 1.8 cm). There were prominent mediastinal lymph nodes (7.3 mm AP window; 7 mm and 8 mm anterior to the central right mainstem bronchus, 11 mm central right hilar lymph node). There was a small left pleural effusion. Bone scanon 05/29/2013 revealed no evidence of metastatic disease. Abdomen and pelvis CTon 05/31/2013 revealed no evidence of abdominal pelvic metastatic disease.  Shereceived 1 cycle of neoadjuvantTCH and Perjetabeginning05/2015.She then received 3 cycles ofAdriamycin and Cytoxan(AC)from08/14/2015-10/19/2013.  She underwent bilateral mastectomyon 11/19/2013.Left breast pathologyrevealed a 3.7 cm grade III invasive mammary carcinoma with extensive lymphovascular invasion within breast and skin. There was metastatic carcinoma in 17 of 17 lymph nodes. Right breast pathologyrevealed extensive lymphovascular invasion by metastatic mammary carcinoma. One lymph node was negative for malignancy. Pathologic stagewas [id number] (stage IV disease).RepeatHER-2/neutesting by [facility] was equivocal.  She began Femara and Ibrancein 11/2013 (last cycle began 03/05/2018).Patient declined further treatment.  Chest, abdomen, and pelvis CTon 12/01/2017 revealed no evidence of metastatic disease.Chest, abdomen, and pelvis CTon  09/07/2018 revealed no acute intrathoracic, abdominal, or pelvis pathology. There was noevidence of metastatic disease. There were multiple uterine fibroids. A 17 x 11 mm lesion in the region of the DB trauma, likely a submucosal or intracavitary fibroid. A polyp was not excluded. Pelvic ultrasound may provide better evaluation on a non-emergent basis. There was colonic diverticulosis. There was no bowel obstruction or active inflammation, and a normal appendix.  CA27.29 has been followed: 60.3 on 05/18/2013, < 3.5 on 11/29/2013, 19.6 on 04/18/2014, 22.9 on 05/16/2014, 16.9 on 08/12/2014, 12.2 on 10/14/2014, 11.9 on 01/06/2015, 11.5 on 04/11/2015, 17.3 on 09/09/2015, 15.7 on 11/19/2015, 15.3 on 02/24/2016, 17.5 on 05/21/2016, 17.1 on 08/24/2016, 17.2 on 11/23/2016, 12.6 on 02/21/2017, 13.2 on 05/24/2017, 18.0 on 07/26/2017, 10.7 on 10/04/2017, 18.0  on 12/01/2017, 12.1 on 02/07/2018, 13.1 on 02/28/2018, 9.0 on 09/07/2018, 11.9 on 01/25/2019, 35.2 on 04/30/2019, and 573.5 on 09/06/2019.  She had a STEMIon 11/03/2015. Cardiac cath revealed 100% occlusion of the proximal left circumflex artery. She was treated at [facility] with primary PCI with drug eluting stent (DES) and tirofiban. Myocardial perfusion SPECT on 03/06/2018 revealed an EF of 59%, normal wall motion, and homogeneous tracer distribution throughout the myocardium.  She has osteoarthritis of her knees.She underwent left knee replacementon 03/10/2020andright knee replacementon 08/01/2018.  Bone densityon 09/26/2018 revealed osteopeniawith a T score of -1.8 in the AP spine L1-L4.  She is on Fosamax.  She has an elevated ferritin.  Ferritin has been followed:  >1500 on 08/30/2019 and 2235 with a sed rate of 30 on 09/05/2019.  Abdomen CT on 09/06/2019 revealed showed numerous (> 15) new confluent hypoenhancing liver masses scattered throughout the liver c/w liver metastases.  Representative lesions include a 5 x 4.8 cm lesion in the  left liver lobe, 8.2 x 4.4 cm anterior inferior mass involving segments 5 and 4B, and 6.5 x 3.3 cm in segment 8 of the right liver.  There was new patchy indistinct small sclerotic foci throughout the visualized axial skeleton c/w osseous metastases. There was a new small right adrenal nodule, suspicious for adrenal metastasis. There was a small hiatal hernia, cholelithiasis, and moderate colonic diverticulosis.  She has been diagnosed with GCA (giant cell arteritis) s/p temporal biopsy on 06/20/2019 (negative).  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  Symptomatically, she has lost 23 pounds in the past 4 months felt secondary to a change in diet.  AST is 173, ALT 197, bilirubin 1.1 and alkaline phosphatase 594.  Plan: 1.   Labs today: CBC with diff, CMP, direct bilirubin, iron studies, CA 27.29, hemochromatosis assay. 2.Metastatic breast cancer Clinically, she denies any complaints.  Concern is raised secondary to elevated LFTs.  She has been off treatment [name] and Femara) since prior to 03/28/2018. Chest abdomen and pelvic CT on 09/07/2018 revealed no evidence of metastatic disease.             Discuss STAT abdomen CT to assess liver today.  Patient in agreement.  Patient to return after CT today to discuss imaging. 3.   Elevated liver function tests  Patient has had rising LFTs since 07/17/2019.  Pattern of increased LFTs not consistent with fatty liver.  Discuss concern for metastatic disease.  STAT abdomen CT today as above. 4.   Multiple liver lesions  STAT abdominal CT today was personally reviewed with patient.  Agree with radiology findings.   Findings are c/w multiple liver lesions likely representing metastatic breast cancer.   Differential includes possible HCC if patient is positive for hemochromatosis (doubt).  Discuss plan for liver biopsy.  Patient in agreement.  5.   Elevated ferritin  Ferritin was > 1500 on 08/30/2019 and 2235 on  09/05/2019.  No prior iron saturation is available to assess hemochromatosis.  Sedimentation rate appears mildly elevated which can result in a elevated ferritin (acute phase reactant).  She has had diabetes which can be a complication of hemochromatosis.  Check iron studies, AFP, and hemochromatosis assay today. 6.Osteopenia Bone density on 09/26/2018 confirmed osteopenia. She is on calcium, vitamin D and Fosamax.  Calcium 10.1 (corrected 10.525).  Stop calcium.  Anticipate initiation of Xgeva for bone metastasis. 7.Weight loss Patient notes weight loss of 2 pounds per week secondary to a change in diet.  Concern for malignancy.  8.RTC 3 days after biopsy for MD assessment, review of biopsy and discussion regarding direction of therapy.  I discussed the assessment and treatment plan with the patient.  The patient was provided an opportunity to ask questions and all were answered.  The patient agreed with the plan and demonstrated an understanding of the instructions.  The patient was advised to call back if the symptoms worsen or if the condition fails to improve as anticipated.  I provided 25 minutes of face-to-face time during this this encounter and > 50% was spent counseling as documented under my assessment and plan. An additional 10 minutes were spent reviewing his chart (Epic and Care Everywhere) including notes, labs, and imaging studies.  An additional 15+ minutes were spent reviewing her chart ([place]) including notes, labs, and imaging studies.    Lequita Asal, MD, PhD    09/06/2019, 9:45 AM  I, Selena Batten, am acting as scribe for [employer]. Mike Gip, MD, PhD.  I, Lanita Stammen C. Mike Gip, MD, have reviewed the above documentation for accuracy and completeness, and I agree with the above.

## 2019-09-06 ENCOUNTER — Other Ambulatory Visit: Payer: Self-pay

## 2019-09-06 ENCOUNTER — Encounter: Payer: Self-pay | Admitting: Hematology and Oncology

## 2019-09-06 ENCOUNTER — Telehealth: Payer: Self-pay

## 2019-09-06 ENCOUNTER — Ambulatory Visit
Admission: RE | Admit: 2019-09-06 | Discharge: 2019-09-06 | Disposition: A | Discharge status: Home / Self Care | Payer: Medicare Other | Source: Ambulatory Visit | Attending: Hematology and Oncology | Admitting: Hematology and Oncology

## 2019-09-06 ENCOUNTER — Inpatient Hospital Stay: Payer: Medicare Other | Attending: Hematology and Oncology

## 2019-09-06 ENCOUNTER — Other Ambulatory Visit: Payer: Self-pay | Admitting: Hematology and Oncology

## 2019-09-06 ENCOUNTER — Inpatient Hospital Stay (HOSPITAL_BASED_OUTPATIENT_CLINIC_OR_DEPARTMENT_OTHER): Payer: Medicare Other | Admitting: Hematology and Oncology

## 2019-09-06 VITALS — BP 124/45 | HR 68 | Temp 97.8°F | Resp 19 | Wt 199.7 lb

## 2019-09-06 DIAGNOSIS — C50812 Malignant neoplasm of overlapping sites of left female breast: Secondary | ICD-10-CM

## 2019-09-06 DIAGNOSIS — I2510 Atherosclerotic heart disease of native coronary artery without angina pectoris: Secondary | ICD-10-CM | POA: Diagnosis not present

## 2019-09-06 DIAGNOSIS — R160 Hepatomegaly, not elsewhere classified: Secondary | ICD-10-CM | POA: Diagnosis not present

## 2019-09-06 DIAGNOSIS — I10 Essential (primary) hypertension: Secondary | ICD-10-CM | POA: Insufficient documentation

## 2019-09-06 DIAGNOSIS — R7989 Other specified abnormal findings of blood chemistry: Secondary | ICD-10-CM

## 2019-09-06 DIAGNOSIS — C787 Secondary malignant neoplasm of liver and intrahepatic bile duct: Secondary | ICD-10-CM | POA: Insufficient documentation

## 2019-09-06 DIAGNOSIS — M8580 Other specified disorders of bone density and structure, unspecified site: Secondary | ICD-10-CM | POA: Insufficient documentation

## 2019-09-06 DIAGNOSIS — Z170 Estrogen receptor positive status [ER+]: Secondary | ICD-10-CM

## 2019-09-06 DIAGNOSIS — C50919 Malignant neoplasm of unspecified site of unspecified female breast: Secondary | ICD-10-CM | POA: Diagnosis present

## 2019-09-06 DIAGNOSIS — C7951 Secondary malignant neoplasm of bone: Secondary | ICD-10-CM

## 2019-09-06 DIAGNOSIS — I252 Old myocardial infarction: Secondary | ICD-10-CM | POA: Insufficient documentation

## 2019-09-06 DIAGNOSIS — Z9013 Acquired absence of bilateral breasts and nipples: Secondary | ICD-10-CM | POA: Diagnosis not present

## 2019-09-06 DIAGNOSIS — R634 Abnormal weight loss: Secondary | ICD-10-CM

## 2019-09-06 DIAGNOSIS — Z79899 Other long term (current) drug therapy: Secondary | ICD-10-CM | POA: Diagnosis not present

## 2019-09-06 DIAGNOSIS — Z87891 Personal history of nicotine dependence: Secondary | ICD-10-CM | POA: Insufficient documentation

## 2019-09-06 DIAGNOSIS — M8588 Other specified disorders of bone density and structure, other site: Secondary | ICD-10-CM

## 2019-09-06 DIAGNOSIS — Z7982 Long term (current) use of aspirin: Secondary | ICD-10-CM | POA: Insufficient documentation

## 2019-09-06 DIAGNOSIS — Z7189 Other specified counseling: Secondary | ICD-10-CM

## 2019-09-06 LAB — COMPREHENSIVE METABOLIC PANEL
ALT: 197 U/L — ABNORMAL HIGH (ref 0–44)
AST: 173 U/L — ABNORMAL HIGH (ref 15–41)
Albumin: 3.5 g/dL (ref 3.5–5.0)
Alkaline Phosphatase: 594 U/L — ABNORMAL HIGH (ref 38–126)
Anion gap: 11 (ref 5–15)
BUN: 27 mg/dL — ABNORMAL HIGH (ref 8–23)
CO2: 26 mmol/L (ref 22–32)
Calcium: 10.1 mg/dL (ref 8.9–10.3)
Chloride: 103 mmol/L (ref 98–111)
Creatinine, Ser: 1.08 mg/dL — ABNORMAL HIGH (ref 0.44–1.00)
GFR calc Af Amer: 58 mL/min — ABNORMAL LOW (ref >60)
GFR calc non Af Amer: 50 mL/min — ABNORMAL LOW (ref >60)
Glucose, Bld: 102 mg/dL — ABNORMAL HIGH (ref 70–99)
Potassium: 4.2 mmol/L (ref 3.5–5.1)
Sodium: 140 mmol/L (ref 135–145)
Total Bilirubin: 1.1 mg/dL (ref 0.3–1.2)
Total Protein: 6.7 g/dL (ref 6.5–8.1)

## 2019-09-06 LAB — CBC WITH DIFFERENTIAL/PLATELET
Abs Immature Granulocytes: 0.18 10*3/uL — ABNORMAL HIGH (ref 0.00–0.07)
Basophils Absolute: 0 10*3/uL (ref 0.0–0.1)
Basophils Relative: 0 %
Eosinophils Absolute: 0 10*3/uL (ref 0.0–0.5)
Eosinophils Relative: 0 %
HCT: 39.9 % (ref 36.0–46.0)
Hemoglobin: 13.2 g/dL (ref 12.0–15.0)
Immature Granulocytes: 2 %
Lymphocytes Relative: 22 %
Lymphs Abs: 2.3 10*3/uL (ref 0.7–4.0)
MCH: 28.8 pg (ref 26.0–34.0)
MCHC: 33.1 g/dL (ref 30.0–36.0)
MCV: 86.9 fL (ref 80.0–100.0)
Monocytes Absolute: 0.6 10*3/uL (ref 0.1–1.0)
Monocytes Relative: 6 %
Neutro Abs: 7.4 10*3/uL (ref 1.7–7.7)
Neutrophils Relative %: 70 %
Platelets: 182 10*3/uL (ref 150–400)
RBC: 4.59 MIL/uL (ref 3.87–5.11)
RDW: 15.4 % (ref 11.5–15.5)
WBC: 10.6 10*3/uL — ABNORMAL HIGH (ref 4.0–10.5)
nRBC: 0.4 % — ABNORMAL HIGH (ref 0.0–0.2)

## 2019-09-06 LAB — IRON AND TIBC
Iron: 79 ug/dL (ref 28–170)
Saturation Ratios: 29 % (ref 10.4–31.8)
TIBC: 273 ug/dL (ref 250–450)
UIBC: 194 ug/dL

## 2019-09-06 LAB — BILIRUBIN, DIRECT: Bilirubin, Direct: 0.2 mg/dL (ref 0.0–0.2)

## 2019-09-06 MED ORDER — IOHEXOL 300 MG/ML  SOLN
100.0000 mL | Freq: Once | INTRAMUSCULAR | Status: AC | PRN
  Administered 2019-09-06: 100 mL via INTRAVENOUS

## 2019-09-06 NOTE — Progress Notes (Signed)
Patient stated the only concern she has is her iron level beig high.

## 2019-09-06 NOTE — Telephone Encounter (Signed)
Faxed orders over to spe[facility]ilty for a liver biopsy today. Fax conformation received.

## 2019-09-07 LAB — AFP TUMOR MARKER: AFP, Serum, Tumor Marker: 6 ng/mL (ref 0.0–8.3)

## 2019-09-07 LAB — CANCER ANTIGEN 27.29: CA 27.29: 573.5 U/mL — ABNORMAL HIGH (ref 0.0–38.6)

## 2019-09-09 DIAGNOSIS — C7951 Secondary malignant neoplasm of bone: Secondary | ICD-10-CM | POA: Insufficient documentation

## 2019-09-12 LAB — HEMOCHROMATOSIS DNA-PCR(C282Y,H63D)

## 2019-09-13 ENCOUNTER — Other Ambulatory Visit: Payer: Self-pay | Admitting: Radiology

## 2019-09-14 ENCOUNTER — Other Ambulatory Visit: Payer: Self-pay | Admitting: Physician Assistant

## 2019-09-14 ENCOUNTER — Other Ambulatory Visit: Payer: Self-pay | Admitting: Radiology

## 2019-09-17 ENCOUNTER — Other Ambulatory Visit: Payer: Self-pay | Admitting: Radiology

## 2019-09-17 NOTE — Progress Notes (Signed)
Patient on schedule for Liver Biopsy 09/18/2019, spoke with patient and made aware to be here @ 0930, NPO after Mn as well as have driver post procedure for discharge. Stated understanding. Will take metoprolol/losartan.

## 2019-09-18 ENCOUNTER — Other Ambulatory Visit: Payer: Self-pay

## 2019-09-18 ENCOUNTER — Ambulatory Visit
Admission: RE | Admit: 2019-09-18 | Discharge: 2019-09-18 | Disposition: A | Discharge status: Home / Self Care | Payer: Medicare Other | Source: Ambulatory Visit | Attending: Hematology and Oncology | Admitting: Hematology and Oncology

## 2019-09-18 DIAGNOSIS — Z853 Personal history of malignant neoplasm of breast: Secondary | ICD-10-CM | POA: Diagnosis not present

## 2019-09-18 DIAGNOSIS — Z8701 Personal history of pneumonia (recurrent): Secondary | ICD-10-CM | POA: Diagnosis not present

## 2019-09-18 DIAGNOSIS — E785 Hyperlipidemia, unspecified: Secondary | ICD-10-CM | POA: Insufficient documentation

## 2019-09-18 DIAGNOSIS — Z955 Presence of coronary angioplasty implant and graft: Secondary | ICD-10-CM | POA: Insufficient documentation

## 2019-09-18 DIAGNOSIS — R7989 Other specified abnormal findings of blood chemistry: Secondary | ICD-10-CM

## 2019-09-18 DIAGNOSIS — Z79899 Other long term (current) drug therapy: Secondary | ICD-10-CM | POA: Insufficient documentation

## 2019-09-18 DIAGNOSIS — C787 Secondary malignant neoplasm of liver and intrahepatic bile duct: Secondary | ICD-10-CM | POA: Diagnosis not present

## 2019-09-18 DIAGNOSIS — Z794 Long term (current) use of insulin: Secondary | ICD-10-CM | POA: Insufficient documentation

## 2019-09-18 DIAGNOSIS — I252 Old myocardial infarction: Secondary | ICD-10-CM | POA: Diagnosis not present

## 2019-09-18 DIAGNOSIS — E119 Type 2 diabetes mellitus without complications: Secondary | ICD-10-CM | POA: Diagnosis not present

## 2019-09-18 DIAGNOSIS — R160 Hepatomegaly, not elsewhere classified: Secondary | ICD-10-CM

## 2019-09-18 DIAGNOSIS — Z87891 Personal history of nicotine dependence: Secondary | ICD-10-CM | POA: Diagnosis not present

## 2019-09-18 DIAGNOSIS — I2510 Atherosclerotic heart disease of native coronary artery without angina pectoris: Secondary | ICD-10-CM | POA: Insufficient documentation

## 2019-09-18 DIAGNOSIS — Z9013 Acquired absence of bilateral breasts and nipples: Secondary | ICD-10-CM | POA: Insufficient documentation

## 2019-09-18 DIAGNOSIS — C50812 Malignant neoplasm of overlapping sites of left female breast: Secondary | ICD-10-CM

## 2019-09-18 DIAGNOSIS — M170 Bilateral primary osteoarthritis of knee: Secondary | ICD-10-CM | POA: Diagnosis not present

## 2019-09-18 DIAGNOSIS — I10 Essential (primary) hypertension: Secondary | ICD-10-CM | POA: Diagnosis not present

## 2019-09-18 DIAGNOSIS — Z96653 Presence of artificial knee joint, bilateral: Secondary | ICD-10-CM | POA: Diagnosis not present

## 2019-09-18 LAB — CBC
HCT: 41 % (ref 36.0–46.0)
Hemoglobin: 13.8 g/dL (ref 12.0–15.0)
MCH: 29 pg (ref 26.0–34.0)
MCHC: 33.7 g/dL (ref 30.0–36.0)
MCV: 86.1 fL (ref 80.0–100.0)
Platelets: 143 10*3/uL — ABNORMAL LOW (ref 150–400)
RBC: 4.76 MIL/uL (ref 3.87–5.11)
RDW: 15.9 % — ABNORMAL HIGH (ref 11.5–15.5)
WBC: 11.4 10*3/uL — ABNORMAL HIGH (ref 4.0–10.5)
nRBC: 1.3 % — ABNORMAL HIGH (ref 0.0–0.2)

## 2019-09-18 LAB — PROTIME-INR
INR: 0.9 (ref 0.8–1.2)
Prothrombin Time: 12.1 seconds (ref 11.4–15.2)

## 2019-09-18 MED ORDER — FENTANYL CITRATE (PF) 100 MCG/2ML IJ SOLN
INTRAMUSCULAR | Status: AC
  Filled 2019-09-18: qty 2

## 2019-09-18 MED ORDER — MIDAZOLAM HCL 2 MG/2ML IJ SOLN
INTRAMUSCULAR | Status: AC | PRN
  Administered 2019-09-18 (×2): 1 mg via INTRAVENOUS

## 2019-09-18 MED ORDER — SODIUM CHLORIDE 0.9 % IV SOLN: INTRAVENOUS | Status: DC

## 2019-09-18 MED ORDER — FENTANYL CITRATE (PF) 100 MCG/2ML IJ SOLN
INTRAMUSCULAR | Status: AC | PRN
  Administered 2019-09-18 (×2): 50 ug via INTRAVENOUS

## 2019-09-18 MED ORDER — MIDAZOLAM HCL 2 MG/2ML IJ SOLN
INTRAMUSCULAR | Status: AC
  Filled 2019-09-18: qty 2

## 2019-09-18 NOTE — Discharge Instructions (Signed)
Liver Biopsy, Care After These instructions give you information about how to care for yourself after your procedure. Your health care provider may also give you more specific instructions. If you have problems or questions, contact your health care provider. What can I expect after the procedure? After your procedure, it is common to have:  Pain and soreness in the area where the biopsy was done.  Bruising around the area where the biopsy was done.  Sleepiness and fatigue for 1-2 days. Follow these instructions at home: Medicines  Take over-the-counter and prescription medicines only as told by your health care provider.  If you were prescribed an antibiotic medicine, take it as told by your health care provider. Do not stop taking the antibiotic even if you start to feel better.  Do not take medicines such as aspirin and ibuprofen unless your health care provider tells you to take them. These medicines thin your blood and can increase the risk of bleeding.  If you are taking prescription pain medicine, take actions to prevent or treat constipation. Your health care provider may recommend that you: ? Drink enough fluid to keep your urine pale yellow. ? Eat foods that are high in fiber, such as fresh fruits and vegetables, whole grains, and beans. ? Limit foods that are high in fat and processed sugars, such as fried or sweet foods. ? Take an over-the-counter or prescription medicine for constipation. Incision care  Follow instructions from your health care provider about how to take care of your incision. Make sure you: ? Wash your hands with soap and water before you change your bandage (dressing). If soap and water are not available, use hand sanitizer. ? Change your dressing as told by your health care provider. ? Leave stitches (sutures), skin glue, or adhesive strips in place. These skin closures may need to stay in place for 2 weeks or longer. If adhesive strip edges start to  loosen and curl up, you may trim the loose edges. Do not remove adhesive strips completely unless your health care provider tells you to do that.  Check your incision area every day for signs of infection. Check for: ? Redness, swelling, or pain. ? Fluid or blood. ? Warmth. ? Pus or a bad smell.  Do not take baths, swim, or use a hot tub until your health care provider says it is okay to do so. Activity   Rest at home for 1-2 days, or as directed by your health care provider. ? Avoid sitting for a long time without moving. Get up to take short walks every 1-2 hours. This is important to improve blood flow and breathing. Ask for help if you feel weak or unsteady.  Return to your normal activities as told by your health care provider. Ask your health care provider what activities are safe for you.  Do not drive or use heavy machinery while taking prescription pain medicine.  Do not lift anything that is heavier than 10 lb (4.5 kg), or the limit that your health care provider tells you, until he or she says that it is safe.  Do not play contact sports for 2 weeks after the procedure. General instructions   Do not drink alcohol in the first week after the procedure.  Have someone stay with you for at least 24 hours after the procedure.  It is your responsibility to obtain your test results. Ask your health care provider, or the department that is doing the test: ? When will my   results be ready? ? How will I get my results? ? What are my treatment options? ? What other tests do I need? ? What are my next steps?  Keep all follow-up visits as told by your health care provider. This is important. Contact a health care provider if:  You have increased bleeding from an incision, resulting in more than a small spot of blood.  You have redness, swelling, or increasing pain in any incisions.  You notice a discharge or a bad smell coming from any of your incisions.  You have a fever or  chills. Get help right away if:  You develop swelling, bloating, or pain in your abdomen.  You become dizzy or faint.  You develop a rash.  You have nausea or you vomit.  You faint, or you have shortness of breath or difficulty breathing.  You develop chest pain.  You have problems with your speech or vision.  You have trouble with your balance or moving your arms or legs. Summary  After the liver biopsy, it is common to have pain, soreness, and bruising in the area, as well as sleepiness and fatigue.  Take over-the-counter and prescription medicines only as told by your health care provider.  Follow instructions from your health care provider about how to care for your incision. Check the incision area daily for signs of infection. This information is not intended to replace advice given to you by your health care provider. Make sure you discuss any questions you have with your health care provider. Document Revised: 02/27/2018 Document Reviewed: 01/14/2017 Elsevier Patient Education  2020 [employer]  

## 2019-09-18 NOTE — Procedures (Signed)
Interventional Radiology Procedure:   Indications: History of breast cancer and multiple liver lesions  Procedure: US guided liver lesion biopsy  Findings: Numerous hypoechoic liver lesions.  Core biopsies obtained from left hepatic lesion.    Complications: None     EBL: less than 10 ml  Plan: Bedrest 3 hours   Risa Auman R. Anselm Pancoast, MD  Pager: [phone]

## 2019-09-18 NOTE — Consult Note (Signed)
Chief Complaint: Patient was seen in consultation today for ultrasound-guided liver lesion biopsy at the request of McDuffie C  Referring Physician(s): Phenix C  Patient Status: [facility] - Out-pt  History of Present Illness: Faith [name] is a 74 y.o. female with metastatic breast cancer.  Patient was admitted to [facility] this summer for altered mental status, new hyperglycemia and noted to have elevated liver function tests.  CT imaging of the abdomen demonstrates multiple hypodense hepatic lesions and most compatible with metastatic disease.  Patient presents for liver lesion biopsy.  Patient has no complaints today.  She denies fevers, chills, chest pain, shortness of pain, abdominal symptoms, urinary symptoms, bowel symptoms or neurologic symptoms.  Patient has not been vaccinated for COVID-19.  Past Medical History:  Diagnosis Date  . Arthritis    bilateral knees  . Cancer [facility]) April 2015   left breast  . Coronary artery disease   . Diabetes mellitus without complication ([place])   . Hyperlipidemia   . Hypertension   . Myocardial infarction ([place]) 11/03/2015   [facility], stent placed  . Pneumonia     Past Surgical History:  Procedure Laterality Date  . BREAST SURGERY Bilateral 11/19/13   bilater mastectomy   . CARDIAC CATHETERIZATION    . CORONARY ANGIOPLASTY    . PORT-A-CATH REMOVAL  2016  . PORTACATH PLACEMENT  06-07-13  . SHOULDER OPEN ROTATOR CUFF REPAIR  2012  . TOTAL KNEE ARTHROPLASTY Left 03/28/2018   Procedure: TOTAL KNEE ARTHROPLASTY-LEFT;  Surgeon: Hessie Knows, MD;  Location: [facility] ORS;  Service: Orthopedics;  Laterality: Left;  . TOTAL KNEE ARTHROPLASTY Right 08/01/2018   Procedure: RIGHT TOTAL KNEE ARTHROPLASTY;  Surgeon: Hessie Knows, MD;  Location: [facility] ORS;  Service: Orthopedics;  Laterality: Right;  . WRIST SURGERY Left 1969   cyst    Allergies: Mushroom extract complex, Nickel, and Shellfish allergy  Medications: Prior to Admission  medications   Medication Sig Start Date End Date Taking? Authorizing Provider  Ascorbic Acid (VITAMIN C) 1000 MG tablet Take 1,000 mg by mouth daily.   Yes [provider]  atorvastatin (LIPITOR) 80 MG tablet Take 80 mg by mouth daily at 6 PM.  02/09/16  Yes [provider]  Biotin 5 MG CAPS Take 5 mg by mouth 2 (two) times a day.   Yes [provider]  Calcium Carb-Cholecalciferol (CALCIUM 600+D3 PO) Take 1 tablet by mouth 2 (two) times a day.   Yes [provider]  Carboxymethylcellul-Glycerin (REFRESH OPTIVE) 1-0.9 % GEL Place 1 drop into both eyes daily as needed.    Yes [provider]  cetirizine (ZYRTEC) 10 MG tablet Take 10 mg by mouth daily as needed.    Yes [provider]  Cholecalciferol 125 MCG (5000 UT) TABS Take 1 capsule by mouth 2 (two) times daily.   Yes [provider]  Coenzyme Q10 (COQ10) 100 MG CAPS Take 100 mg by mouth daily.   Yes [provider]  losartan (COZAAR) 50 MG tablet Take 50 mg by mouth daily.    Yes [provider]  metoprolol tartrate (LOPRESSOR) 25 MG tablet Take 25 mg by mouth 2 (two) times daily.  11/05/15 07/27/26 Yes [provider]  pantoprazole (PROTONIX) 40 MG tablet Take 40 mg by mouth daily. 07/10/19 07/09/20 Yes [provider]  predniSONE (DELTASONE) 10 MG tablet Take 25 mg by mouth daily.    Yes [provider]  sulfamethoxazole-trimethoprim (BACTRIM DS) 800-160 MG tablet Take 1 tablet by mouth 3 (  three) times a week. 08/02/19  Yes [provider]  alendronate (FOSAMAX) 70 MG tablet 70 mg once a week.     [provider]  aspirin EC 81 MG tablet Take 81 mg by mouth daily.    [provider]  Calcium Carbonate-Vitamin D 600-400 MG-UNIT tablet Take by mouth. 09/05/19 09/04/20  [provider]  glucose blood (PRECISION QID TEST) test strip 1 each (1 strip total) by XX route 5 (five) times daily Use as instructed.  08/27/19 08/26/20  [provider]  insulin glargine (LANTUS) 100 UNIT/ML injection Inject 60 Units into the skin daily. Patient not taking: Reported on 08/10/2019 07/25/19 07/24/20  [provider]  insulin lispro (HUMALOG) 100 UNIT/ML KwikPen Inject 18 Units into the skin 3 (three) times daily before meals. Patient not taking: Reported on 08/10/2019 08/03/19 08/02/20  [provider]  OneTouch Delica Lancets 25O MISC Apply 1 each topically 3 (three) times daily. 08/13/19   [provider]     Family History  Adopted: Yes    Social History   Socioeconomic History  . Marital status: Married    Spouse name: Not on file  . Number of children: Not on file  . Years of education: Not on file  . Highest education level: Not on file  Occupational History  . Not on file  Tobacco Use  . Smoking status: Former Smoker    Packs/day: 1.00    Years: 15.00    Pack years: 15.00    Types: Cigarettes    Quit date: 06/12/1985    Years since quitting: 34.2  . Smokeless tobacco: Never Used  Vaping Use  . Vaping Use: Never used  Substance and Sexual Activity  . Alcohol use: Yes    Alcohol/week: 0.0 standard drinks    Comment: rarely  . Drug use: No  . Sexual activity: Not on file  Other Topics Concern  . Not on file  Social History Narrative  . Not on file   Social Determinants of Health   Financial Resource Strain:   . Difficulty of Paying Living Expenses: Not on file  Food Insecurity:   . Worried About Charity fundraiser in the Last Year: Not on file  . Ran Out of Food in the Last Year: Not on file  Transportation Needs:   . Lack of Transportation (Medical): Not on file  . Lack of Transportation (Non-Medical): Not on file  Physical Activity:   . Days of Exercise per Week: Not on file  . Minutes of Exercise per Session: Not on file  Stress:   . Feeling of Stress : Not on file  Social Connections:   . Frequency of Communication with Friends and Family: Not  on file  . Frequency of Social Gatherings with Friends and Family: Not on file  . Attends Religious Services: Not on file  . Active Member of Clubs or Organizations: Not on file  . Attends Archivist Meetings: Not on file  . Marital Status: Not on file    Review of Systems: A 12 point ROS discussed and pertinent positives are indicated in the HPI above.  All other systems are negative.  Review of Systems  Constitutional: Negative for chills and fever.  Respiratory: Negative.   Cardiovascular: Negative.   Gastrointestinal: Negative.   Genitourinary: Negative.     Vital Signs: BP 102/85   Temp 97.7 F (36.5 C) (Oral)   Resp 20   Ht 5\' 7"  (1.702 m)  Wt 89.8 kg   SpO2 93%   BMI 31.01 kg/m   Physical Exam Cardiovascular:     Rate and Rhythm: Normal rate and regular rhythm.     Heart sounds: Normal heart sounds.  Pulmonary:     Effort: Pulmonary effort is normal.     Breath sounds: Normal breath sounds.  Abdominal:     General: Abdomen is flat. Bowel sounds are normal.     Palpations: Abdomen is soft.  Neurological:     Mental Status: She is alert.     Imaging: CT Abdomen W Contrast  Result Date: 09/06/2019 CLINICAL DATA:  History of breast cancer. Abnormal liver function tests. Restaging. EXAM: CT ABDOMEN WITH CONTRAST TECHNIQUE: Multidetector CT imaging of the abdomen was performed using the standard protocol following bolus administration of intravenous contrast. CONTRAST:  143mL OMNIPAQUE IOHEXOL 300 MG/ML  SOLN COMPARISON:  09/07/2018 CT chest, abdomen and pelvis. FINDINGS: Lower chest: No significant pulmonary nodules or acute consolidative airspace disease. Hepatobiliary: There are numerous (greater than 15) new confluent hypoenhancing liver masses scattered throughout the liver. Representative segment 4A left liver lobe 5.0 x 4.8 cm mass (series 2/image 14), 8.2 x 4.4 cm anterior inferior liver mass (series 2/image 36) involving segments 5 and 4B, and 6.5 x  3.3 cm segment 8 right liver lobe mass (series 2/image 19). Cholelithiasis. No intrahepatic biliary ductal dilatation. Top normal caliber common bile duct (6 mm diameter). Pancreas: Normal, with no mass or duct dilation. Spleen: Normal size. No mass. Adrenals/Urinary Tract: New hypodense 1.2 cm right adrenal nodule with density 54 HU (series 2/image 22). No discrete left adrenal nodule. No hydronephrosis. Simple 3.9 cm posterior lower right renal cyst. Subcentimeter hypodense renal cortical lesions in both kidneys are too small to characterize. Stomach/Bowel: Small hiatal hernia. Otherwise normal nondistended stomach. Visualized small and large bowel is normal caliber, with no bowel wall thickening. Moderate colonic diverticulosis. Right upper quadrant cecum. Vascular/Lymphatic: Atherosclerotic nonaneurysmal abdominal aorta. Patent portal, splenic, hepatic and renal veins. No pathologically enlarged lymph nodes in the abdomen. Other: No pneumoperitoneum, ascites or focal fluid collection. Musculoskeletal: New patchy indistinct small sclerotic foci throughout the visualized axial skeleton with representative sclerotic 1.4 cm L2 vertebral lesion (series 5/image 71). IMPRESSION: 1. Numerous new confluent hypoenhancing liver masses scattered throughout the liver, compatible with liver metastases. 2. New patchy indistinct small sclerotic foci throughout the visualized axial skeleton, compatible with osseous metastases. 3. New small right adrenal nodule, suspicious for adrenal metastasis. 4. Small hiatal hernia. 5. Cholelithiasis. 6. Moderate colonic diverticulosis. 7. Aortic Atherosclerosis (ICD10-I70.0). These results will be called to the ordering clinician or representative by the Radiology Department at the imaging location. Electronically Signed   By: Ilona Sorrel M.D.   On: 09/06/2019 11:01    Labs:  CBC: Recent Labs    01/25/19 0850 04/30/19 1433 09/06/19 0917 09/18/19 1002  WBC 10.6* 10.3 10.6* 11.4*    HGB 13.2 13.5 13.2 13.8  HCT 39.8 40.8 39.9 41.0  PLT 225 250 182 143*    COAGS: Recent Labs    09/18/19 1002  INR 0.9    BMP: Recent Labs    01/25/19 0850 04/30/19 1433 09/06/19 0917  NA 138 137 140  K 4.1 4.2 4.2  CL 100 100 103  CO2 28 28 26   GLUCOSE 126* 109* 102*  BUN 27* 16 27*  CALCIUM 9.8 9.9 10.1  CREATININE 1.00 0.90 1.08*  GFRNONAA 55* >60 50*  GFRAA >60 >60 58*    LIVER FUNCTION TESTS:  Recent Labs    01/25/19 0850 04/30/19 1433 09/06/19 0917  BILITOT 0.8 0.8 1.1  AST 21 25 173*  ALT 26 31 197*  ALKPHOS 102 117 594*  PROT 7.4 7.8 6.7  ALBUMIN 3.8 4.0 3.5    TUMOR MARKERS: No results for input(s): AFPTM, CEA, CA199, CHROMGRNA in the last 8760 hours.  Assessment and Plan:  74 year old with history of breast cancer.  Patient has new onset of elevated liver function tests and CT imaging demonstrates multiple liver lesions.  Findings are suggestive for metastatic breast cancer.  Patient needs a tissue diagnosis.  Patient presents for ultrasound-guided liver lesion biopsy.  Risks and benefits of ultrasound-guided liver biopsy was discussed with the patient and/or patient's family including, but not limited to bleeding, infection, damage to adjacent structures or low yield requiring additional tests.  All of the questions were answered and there is agreement to proceed.  Consent signed and in chart.    Thank you for this interesting consult.  I greatly enjoyed meeting Faith [name] [facility] and look forward to participating in their care.  A copy of this report was sent to the requesting provider on this date.  Electronically Signed: Burman Riis, MD 09/18/2019, 11:05 AM   I spent a total of  15 Minutes   in face to face in clinical consultation, greater than 50% of which was counseling/coordinating care for liver biopsy.

## 2019-09-18 NOTE — Progress Notes (Signed)
Patient clinically stable post Liver Biopsy per Dr Anselm Pancoast, tolerated well. Awake/alert and oriented post procedure. Denies complaints at this time 3 hours bedrest until 1450 per orders. bandade dressing dry and intact to mid left abdomen. Denies complaints at this time. Report given to Carlynn Spry RN post procedure in specials. Received Versed 2mg  along with Fentanyl 198mcg IV for procedure. Vitals stable pre and post procedure.

## 2019-09-19 ENCOUNTER — Encounter: Payer: Self-pay | Admitting: *Deleted

## 2019-09-24 NOTE — Progress Notes (Signed)
[facility]  [address], [address] [place],  [address] Phone: [phone]  Fax: [phone]   Clinic Day:  09/25/2019  Referring physician: Leonel Ramsay, MD  Chief Complaint: Faith [name] is a 74 y.o. female with metastatic breast cancerwho is for assessment after interval biopsy and discussion regarding direction of therapy.  HPI: The patient was last seen in the medical oncology clinic on 09/06/2019.  At that time, she had lost 23 pounds in the past 4 months felt secondary to a change in diet. Hematocrit was 39.9, hemoglobin 13.2, platelets 182,000, WBC 10,600 (ANC 7,400). AST was 173, ALT 197, bilirubin 1.1 (direct 0.2) and alkaline phosphatase 594. BUN was 27. Creatinine was 1.08 (CrCl 50 ml.min). Iron saturation was 29% and TIBC 273. Hemachromatosis assay was negative. AFP was 6.0. CA27.29 was 573.5. We discussed concern for metastatic breast cancer disease given her elevated LFTs.  Abdomen CT with contrast on 09/06/2019 revealed numerous new confluent hypoenhancing liver masses scattered throughout the liver, compatible with liver metastases. There were new patchy indistinct small sclerotic foci throughout the visualized axial skeleton, compatible with osseous metastases. There was a new small right adrenal nodule, suspicious for adrenal metastasis. There was a small hiatal hernia, cholelithiasis, moderate colonic diverticulosis, and aortic atherosclerosis.  Ultrasound guided biopsy of the left lobe of the liver on 09/18/2019 was positive for malignancy, compatible with metastatic mammary carcinoma. Tumor cells were positive for CK7 (patchy) and GATA-3. ER, PR, and Her2/neu are pending.  During the interim, she has been "ok". Her energy level has been low. She had no appetite. She denies neuropathy, nausea, vomiting, and diarrhea. Her blood sugar this morning was 116 and have been "low" lately. Her highest recent blood sugar was 166 after she ate a  large dinner. She is able to perform ADLs on her own.  The patient would like to proceed with aggressive treatment, including chemotherapy. Echo at [facility] on 06/17/2019 revealed an EF of 63%.  The patient will reach out to her rheumatologist and ask her to call me to discuss her case.   Past Medical History:  Diagnosis Date  . Arthritis    bilateral knees  . Cancer [facility]) April 2015   left breast  . Coronary artery disease   . Diabetes mellitus without complication ([place])   . Hyperlipidemia   . Hypertension   . Myocardial infarction ([place]) 11/03/2015   [facility], stent placed  . Pneumonia     Past Surgical History:  Procedure Laterality Date  . BREAST SURGERY Bilateral 11/19/13   bilater mastectomy   . CARDIAC CATHETERIZATION    . CORONARY ANGIOPLASTY    . PORT-A-CATH REMOVAL  2016  . PORTACATH PLACEMENT  06-07-13  . SHOULDER OPEN ROTATOR CUFF REPAIR  2012  . TOTAL KNEE ARTHROPLASTY Left 03/28/2018   Procedure: TOTAL KNEE ARTHROPLASTY-LEFT;  Surgeon: Hessie Knows, MD;  Location: [facility] ORS;  Service: Orthopedics;  Laterality: Left;  . TOTAL KNEE ARTHROPLASTY Right 08/01/2018   Procedure: RIGHT TOTAL KNEE ARTHROPLASTY;  Surgeon: Hessie Knows, MD;  Location: [facility] ORS;  Service: Orthopedics;  Laterality: Right;  . WRIST SURGERY Left 1969   cyst    Family History  Adopted: Yes    Social History:  reports that she quit smoking about 34 years ago. Her smoking use included cigarettes. She has a 15.00 pack-year smoking history. She has never used smokeless tobacco. She reports current alcohol use. She reports that she does not use drugs. Denies family history of hemochromatosis.  She is retired; she previously drove a truck. She is from [place]. The patient is accompanied by her best friend, Joslyn Devon, today.  Allergies:  Allergies  Allergen Reactions  . Mushroom Extract Complex Hives and Swelling  . Nickel Rash  . Shellfish Allergy Nausea And Vomiting    Current Medications: Current  Outpatient Medications  Medication Sig Dispense Refill  . alendronate (FOSAMAX) 70 MG tablet 70 mg once a week.     . Ascorbic Acid (VITAMIN C) 1000 MG tablet Take 1,000 mg by mouth daily.    Marland Kitchen aspirin EC 81 MG tablet Take 81 mg by mouth daily.    Marland Kitchen atorvastatin (LIPITOR) 80 MG tablet Take 80 mg by mouth daily at 6 PM.     . Biotin 5 MG CAPS Take 5 mg by mouth 2 (two) times a day.    . Calcium Carb-Cholecalciferol (CALCIUM 600+D3 PO) Take 1 tablet by mouth 2 (two) times a day.    . Cholecalciferol 125 MCG (5000 UT) TABS Take 1 capsule by mouth 2 (two) times daily.    . Coenzyme Q10 (COQ10) 100 MG CAPS Take 100 mg by mouth daily.    . Denosumab (XGEVA [place]) Inject into the skin.    Marland Kitchen glucose blood (PRECISION QID TEST) test strip 1 each (1 strip total) by XX route 5 (five) times daily Use as instructed.    Marland Kitchen losartan (COZAAR) 50 MG tablet Take 50 mg by mouth daily.     . metoprolol tartrate (LOPRESSOR) 25 MG tablet Take 25 mg by mouth 2 (two) times daily.     Glory Rosebush Delica Lancets 55V MISC Apply 1 each topically 3 (three) times daily.    . predniSONE (DELTASONE) 10 MG tablet Take 25 mg by mouth daily.     Marland Kitchen sulfamethoxazole-trimethoprim (BACTRIM DS) 800-160 MG tablet Take 1 tablet by mouth 3 (three) times a week.    . Carboxymethylcellul-Glycerin (REFRESH OPTIVE) 1-0.9 % GEL Place 1 drop into both eyes daily as needed.  (Patient not taking: Reported on 09/25/2019)    . cetirizine (ZYRTEC) 10 MG tablet Take 10 mg by mouth daily as needed.  (Patient not taking: Reported on 09/25/2019)    . insulin glargine (LANTUS) 100 UNIT/ML injection Inject 60 Units into the skin daily. (Patient not taking: Reported on 08/10/2019)    . insulin lispro (HUMALOG) 100 UNIT/ML KwikPen Inject 18 Units into the skin 3 (three) times daily before meals. (Patient not taking: Reported on 08/10/2019)    . pantoprazole (PROTONIX) 40 MG tablet Take 40 mg by mouth daily. (Patient not taking: Reported on 09/25/2019)     No current  facility-administered medications for this visit.   Facility-Administered Medications Ordered in Other Visits  Medication Dose Route Frequency Provider Last Rate Last Admin  . sodium chloride 0.9 % injection 10 mL  10 mL Intravenous PRN Choksi, Janak, MD   10 mL at 09/12/14 1014    Review of Systems  Constitutional: Positive for malaise/fatigue (low energy) and weight loss (4 lbs). Negative for chills, diaphoresis and fever.       Feels "pretty good".  HENT: Negative.  Negative for congestion, ear discharge, ear pain, hearing loss, nosebleeds, sinus pain, sore throat and tinnitus.   Eyes: Negative.  Negative for blurred vision and double vision.       Loss of vision in right eye.  Respiratory: Negative.  Negative for cough, hemoptysis, sputum production and shortness of breath.   Cardiovascular: Negative.  Negative for chest pain,  palpitations, orthopnea, leg swelling and PND.       S/p MI 10/2015.   Gastrointestinal: Negative for abdominal pain, blood in stool, constipation, diarrhea, heartburn, melena, nausea and vomiting.       No appetite.  Genitourinary: Negative.  Negative for dysuria, frequency, hematuria and urgency.  Musculoskeletal: Negative for back pain, joint pain (arthritis, stable), myalgias and neck pain.       S/p bilateral knee replacement.  Skin: Negative.  Negative for rash.  Neurological: Negative.  Negative for dizziness, tingling, sensory change, speech change, focal weakness, weakness and headaches.  Endo/Heme/Allergies: Negative.  Does not bruise/bleed easily.       Blood sugar under good control.  Psychiatric/Behavioral: Negative.  Negative for depression and memory loss. The patient is not nervous/anxious and does not have insomnia.   All other systems reviewed and are negative.  Performance status (ECOG): 1  Vitals Blood pressure (!) 108/58, pulse 93, temperature (!) 96.7 F (35.9 C), temperature source Tympanic, resp. rate 18, weight 194 lb 0.1 oz (88 kg),  SpO2 96 %.   Physical Exam Vitals and nursing note reviewed.  Constitutional:      General: She is not in acute distress.    Appearance: She is well-developed. She is not diaphoretic.     Comments: She has a cane by her side.  HENT:     Head: Normocephalic and atraumatic.     Comments: Short gray hair.    Mouth/Throat:     Mouth: Mucous membranes are moist.     Pharynx: Oropharynx is clear. No oropharyngeal exudate.  Eyes:     General: No scleral icterus.    Conjunctiva/sclera: Conjunctivae normal.     Pupils: Pupils are equal, round, and reactive to light.     Comments: Glasses.  Hazel/brown eyes.  Neck:     Vascular: No JVD.  Cardiovascular:     Rate and Rhythm: Normal rate and regular rhythm.     Heart sounds: Normal heart sounds. No murmur heard.   Pulmonary:     Effort: Pulmonary effort is normal. No respiratory distress.     Breath sounds: Normal breath sounds. No wheezing or rales.  Chest:     Chest wall: No tenderness.  Abdominal:     General: Bowel sounds are normal. There is no distension.     Palpations: Abdomen is soft. There is hepatomegaly (5 fingerbreaths below the right costal margin). There is no mass.     Tenderness: There is no abdominal tenderness. There is no guarding or rebound.  Musculoskeletal:        General: No tenderness. Normal range of motion.     Cervical back: Normal range of motion and neck supple.  Lymphadenopathy:     Head:     Right side of head: No preauricular, posterior auricular or occipital adenopathy.     Left side of head: No preauricular, posterior auricular or occipital adenopathy.     Cervical: No cervical adenopathy.     Upper Body:     Right upper body: No supraclavicular or axillary adenopathy.     Left upper body: No supraclavicular or axillary adenopathy.     Lower Body: No right inguinal adenopathy. No left inguinal adenopathy.  Skin:    General: Skin is warm and dry.     Comments: Old bruises on abdomen. Large area of  ecchymosis on right forearm.  Neurological:     Mental Status: She is alert and oriented to person, place, and time.  Deep Tendon Reflexes: Reflexes are normal and symmetric.     Comments: Chronic lower extremity changes.  Psychiatric:        Behavior: Behavior normal.        Thought Content: Thought content normal.        Judgment: Judgment normal.    No visits with results within 3 Day(s) from this visit.  Latest known visit with results is:  Hospital Outpatient Visit on 09/18/2019  Component Date Value Ref Range Status  . WBC 09/18/2019 11.4* 4.0 - 10.5 K/uL Final  . RBC 09/18/2019 4.76  3.87 - 5.11 MIL/uL Final  . Hemoglobin 09/18/2019 13.8  12.0 - 15.0 g/dL Final  . HCT 09/18/2019 41.0  36 - 46 % Final  . MCV 09/18/2019 86.1  80.0 - 100.0 fL Final  . MCH 09/18/2019 29.0  26.0 - 34.0 pg Final  . MCHC 09/18/2019 33.7  30.0 - 36.0 g/dL Final  . RDW 09/18/2019 15.9* 11.5 - 15.5 % Final  . Platelets 09/18/2019 143* 150 - 400 K/uL Final  . nRBC 09/18/2019 1.3* 0.0 - 0.2 % Final   Performed at [facility], [address], [place], [place] [address]  . Prothrombin Time 09/18/2019 12.1  11.4 - 15.2 seconds Final  . INR 09/18/2019 0.9  0.8 - 1.2 Final   Comment: (NOTE) INR goal varies based on device and disease states. Performed at [facility], [address], [place], [place] [address]   . SURGICAL PATHOLOGY 09/18/2019    Final-Edited                   Value:SURGICAL PATHOLOGY CASE: [id number] PATIENT: [facility] Surgical Pathology Report     Specimen Submitted: A. Liver, left  Clinical History: Breast cancer and new liver lesions. Metastatic breast cancer.    DIAGNOSIS: A. LIVER, LEFT LOBE; ULTRASOUND-GUIDED CORE NEEDLE BIOPSY: - POSITIVE FOR MALIGNANCY. - COMPATIBLE WITH METASTATIC MAMMARY CARCINOMA.  Comment: Immunohistochemical studies show tumor cells to be positive for CK7 (patchy) and GATA-3. These findings support the above  diagnosis.  Ancillary ER, PR, and HER-2 testing is pending, and will be reported as an addendum.  IHC slides were prepared by [facility], [place], [place]. All controls stained appropriately.  This test was developed and its performance characteristics determined by [facility]. It has not been cleared or approved by the [place] Food and Drug Administration. The FDA does not require this test to go through premarket FDA review. This test is used for cli                         nical purposes. It should not be regarded as investigational or for research. This laboratory is certified under the Clinical Laboratory Improvement Amendments (CLIA) as qualified to perform high complexity clinical laboratory testing.  GROSS DESCRIPTION: A. Labeled: Left liver Received: Formalin Number of needle core biopsy(s): 3 Length: Range from 0.7 to 1.6 cm Diameter: 0.1 cm Description: Received are cores of tan soft tissue. Ink: None Entirely submitted in cassettes 1-3 with 1 core per cassette.     Final Diagnosis performed by Allena Napoleon, MD.   Electronically signed 09/20/2019 2:00:25PM The electronic signature indicates that the named Attending Pathologist has evaluated the specimen Technical component performed at [facility], [address], [place], [place] [address] Lab: [phone] Dir: Rush Farmer, MD, MMM  Professional component performed at [facility], [facility], [place], [place],  [address]  Lab: [phone] Dir: T                         ara C. Reuel Derby, MD     Assessment:  Faith [name] is a 74 y.o. female with metastatic breast cancers/p biopsyon 05/11/2013. Pathology revealed invasive mammary carcinoma. Axillary lymph node was positive for metastatic disease. Tumor was ERpositive (>90%),PRpositive (80%)and HER-2/neu 2+ by IHC (equivocal by [facility]).Clinical stagewas T4dN1M1(stage IV).  Chest CTon 05/09/2013 revealed a  2.8 cm soft tissue nodule in the left axillary tail. There was bulky axillary adenopathy (largest 1.8 cm). There were prominent mediastinal lymph nodes (7.3 mm AP window; 7 mm and 8 mm anterior to the central right mainstem bronchus, 11 mm central right hilar lymph node). There was a small left pleural effusion. Bone scanon 05/29/2013 revealed no evidence of metastatic disease. Abdomen and pelvis CTon 05/31/2013 revealed no evidence of abdominal pelvic metastatic disease.  Shereceived 1 cycle of neoadjuvantTCH and Perjetabeginning05/2015.She then received 3 cycles ofAdriamycin and Cytoxan(AC)from08/14/2015-10/19/2013.  She underwent bilateral mastectomyon 11/19/2013.Left breast pathologyrevealed a 3.7 cm grade III invasive mammary carcinoma with extensive lymphovascular invasion within breast and skin. There was metastatic carcinoma in 17 of 17 lymph nodes. Right breast pathologyrevealed extensive lymphovascular invasion by metastatic mammary carcinoma. One lymph node was negative for malignancy. Pathologic stagewas ypT4bypN3aypM1 (stage IV disease).RepeatHER-2/neutesting by [facility] was equivocal.  She began Femara and Ibrancein 11/2013 (last cycle began 03/05/2018).Patient declined further treatment.  Chest, abdomen, and pelvis CTon 12/01/2017 revealed no evidence of metastatic disease.Chest, abdomen, and pelvis CTon 09/07/2018 revealed no acute intrathoracic, abdominal, or pelvis pathology. There was noevidence of metastatic disease. There were multiple uterine fibroids. A 17 x 11 mm lesion in the region of the DB trauma, likely a submucosal or intracavitary fibroid. A polyp was not excluded. Pelvic ultrasound may provide better evaluation on a non-emergent basis. There was colonic diverticulosis. There was no bowel obstruction or active inflammation, and a normal appendix.  CA27.29 has been followed: 60.3 on 05/18/2013, < 3.5 on 11/29/2013, 19.6 on  04/18/2014, 22.9 on 05/16/2014, 16.9 on 08/12/2014, 12.2 on 10/14/2014, 11.9 on 01/06/2015, 11.5 on 04/11/2015, 17.3 on 09/09/2015, 15.7 on 11/19/2015, 15.3 on 02/24/2016, 17.5 on 05/21/2016, 17.1 on 08/24/2016, 17.2 on 11/23/2016, 12.6 on 02/21/2017, 13.2 on 05/24/2017, 18.0 on 07/26/2017, 10.7 on 10/04/2017, 18.0 on 12/01/2017, 12.1 on 02/07/2018, 13.1 on 02/28/2018, 9.0 on 09/07/2018, 11.9 on 01/25/2019, 35.2 on 04/30/2019, and 573.5 on 09/06/2019.  She had a STEMIon 11/03/2015. Cardiac cath revealed 100% occlusion of the proximal left circumflex artery. She was treated at [facility] with primary PCI with drug eluting stent (DES) and tirofiban. Myocardial perfusion SPECT on 03/06/2018 revealed an EF of 59%, normal wall motion, and homogeneous tracer distribution throughout the myocardium.  She has osteoarthritis of her knees.She underwent left knee replacementon 03/10/2020andright knee replacementon 08/01/2018.  Bone densityon 09/26/2018 revealed osteopeniawith a T score of -1.8 in the AP spine L1-L4.  She is on Fosamax.  She has an elevated ferritin.  Ferritin has been followed:  >1500 on 08/30/2019 and 2235 with a sed rate of 30 on 09/05/2019.  Hemochromatosis assay on 09/06/2019 was normal.  Abdomen CT on 09/06/2019 revealed showed numerous (> 15) new confluent hypoenhancing liver masses scattered throughout the liver c/w liver metastases.  Representative lesions include a 5 x 4.8 cm lesion in the left liver lobe, 8.2 x 4.4 cm anterior inferior mass involving segments 5 and 4B, and 6.5 x 3.3 cm in segment 8  of the right liver.  There was new patchy indistinct small sclerotic foci throughout the visualized axial skeleton c/w osseous metastases. There was a new small right adrenal nodule, suspicious for adrenal metastasis. There was a small hiatal hernia, cholelithiasis, and moderate colonic diverticulosis.  She has been diagnosed with GCA (giant cell arteritis) s/p temporal biopsy on  06/20/2019 (negative).  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  Ultrasound guided biopsy of the left lobe of the liver on 09/18/2019 was compatible with metastatic mammary carcinoma. Tumor cells were positive for CK7 (patchy) and GATA-3. ER, PR, and Her2/neu are pending.  Symptomatically, she is fatigued.  Exam reveals hepatomegaly.  Plan: 1.   Labs today:  CMP, direct bilirubin, INR. 2.Metastatic breast cancer Patient has progressive disease.    Abdomen on 09/07/2018 was personally reviewed.  Agree with radiology interpretation.     There are numerous liver lesions without evidence of intra hepatic biliary obstruction (reviewed with radiology).   There are patchy sclerotic lesions in the skeleton suggestive of osseous metastasis.  Discuss plan for chest CT to complete staging.  Anticipate bone scan to assess extent of metastatic bone disease.   If there are metastatic lesions in weightbearing bones, will check plain films to assess integrity of bone.  Review pathology from recent liver biopsy consistent with metastatic breast cancer.   Await ER, PR and HER-2/neu.  Discuss plans for systemic IV chemotherapy given rapidly progressive liver disease.   Chemotherapy based on HER-2/neu status.    If HER-2 negative: Taxol weekly.     If HER-2/neu positive: Herceptin, Perjeta and Taxol.   Patient wishes to pursue therapy.  Discuss plans for Port-A-Cath placement.  Follow-up ER, PR and HER-2/neu testing.    Multiple questions asked and answered. 3.   Elevated liver function tests  Etiology secondary to breast cancer metastatic to liver.  LReviewed films with radiology.  No intrahepatic biliary obstruction.  FTs will be monitored closely during chemotherapy. 4.   Elevated ferritin  Ferritin was > 1500 on 08/30/2019 and 2235 on 09/05/2019.  Iron saturation on 09/06/2019 was 29% (normal).  Hemochromatosis assay on 09/06/2019 was normal.  Etiology felt secondary to liver  disease. 5.Hypercalcemia  Calcium 10.1 (corrected 10.525) on 09/06/2019.  Calcium 11.2 (corrected 11.695) on 09/25/2019.  Etiology secondary to malignancy.  Patient contacted after labs available.  She will receive Zometa tomorrow.  Patient to discontinue weekly Fosamax. 6.   Chest CT on 09/28/2019. 7.   Port-a-cath placement Dr Lucky Cowboy or [facility]. 8.   RTC in 1 week for MD assessment, labs (CBC with diff, CMP, Mg, CA27.29), and chemotherapy (TBD).  Addendum: ER, PR and HER-2/neu were reported on 09/27/2019.  ER was > 90%, PR < 1% and HER-2 new negative (score 0).  The patient was contacted regarding these results of.  Plans for weekly Taxol was reviewed.  I discussed consideration of chemotherapy class.  The patient felt that this was not necessary as she had chemotherapy in the past.   I discussed the assessment and treatment plan with the patient.  The patient was provided an opportunity to ask questions and all were answered.  The patient agreed with the plan and demonstrated an understanding of the instructions.  The patient was advised to call back if the symptoms worsen or if the condition fails to improve as anticipated.  I provided 24 minutes of face-to-face time during this this encounter and > 50% was spent counseling as documented under my assessment and plan.  An additional 10 minutes were spent reviewing her chart (Epic and Care Everywhere) including notes, labs, and imaging studies.    Lequita Asal, MD, PhD    09/25/2019, 1:56 PM  I, Mirian Mo Tufford, am acting as Education administrator for [employer]. Mike Gip, MD, PhD.  I, Andrews Tener C. Mike Gip, MD, have reviewed the above documentation for accuracy and completeness, and I agree with the above.

## 2019-09-25 ENCOUNTER — Other Ambulatory Visit: Payer: Self-pay

## 2019-09-25 ENCOUNTER — Other Ambulatory Visit: Payer: Self-pay | Admitting: Hematology and Oncology

## 2019-09-25 ENCOUNTER — Inpatient Hospital Stay: Payer: Medicare Other

## 2019-09-25 ENCOUNTER — Encounter: Payer: Self-pay | Admitting: Hematology and Oncology

## 2019-09-25 ENCOUNTER — Telehealth: Payer: Self-pay

## 2019-09-25 ENCOUNTER — Inpatient Hospital Stay: Payer: Medicare Other | Attending: Hematology and Oncology | Admitting: Hematology and Oncology

## 2019-09-25 VITALS — BP 108/58 | HR 93 | Temp 96.7°F | Resp 18 | Wt 194.0 lb

## 2019-09-25 DIAGNOSIS — Z794 Long term (current) use of insulin: Secondary | ICD-10-CM | POA: Insufficient documentation

## 2019-09-25 DIAGNOSIS — Z87891 Personal history of nicotine dependence: Secondary | ICD-10-CM | POA: Insufficient documentation

## 2019-09-25 DIAGNOSIS — R7989 Other specified abnormal findings of blood chemistry: Secondary | ICD-10-CM | POA: Insufficient documentation

## 2019-09-25 DIAGNOSIS — C50812 Malignant neoplasm of overlapping sites of left female breast: Secondary | ICD-10-CM

## 2019-09-25 DIAGNOSIS — R17 Unspecified jaundice: Secondary | ICD-10-CM | POA: Diagnosis not present

## 2019-09-25 DIAGNOSIS — I10 Essential (primary) hypertension: Secondary | ICD-10-CM | POA: Insufficient documentation

## 2019-09-25 DIAGNOSIS — Z170 Estrogen receptor positive status [ER+]: Secondary | ICD-10-CM | POA: Diagnosis not present

## 2019-09-25 DIAGNOSIS — C7951 Secondary malignant neoplasm of bone: Secondary | ICD-10-CM | POA: Diagnosis not present

## 2019-09-25 DIAGNOSIS — Z9013 Acquired absence of bilateral breasts and nipples: Secondary | ICD-10-CM | POA: Diagnosis not present

## 2019-09-25 DIAGNOSIS — I252 Old myocardial infarction: Secondary | ICD-10-CM | POA: Diagnosis not present

## 2019-09-25 DIAGNOSIS — C773 Secondary and unspecified malignant neoplasm of axilla and upper limb lymph nodes: Secondary | ICD-10-CM | POA: Insufficient documentation

## 2019-09-25 DIAGNOSIS — C787 Secondary malignant neoplasm of liver and intrahepatic bile duct: Secondary | ICD-10-CM

## 2019-09-25 DIAGNOSIS — C50919 Malignant neoplasm of unspecified site of unspecified female breast: Secondary | ICD-10-CM | POA: Insufficient documentation

## 2019-09-25 DIAGNOSIS — Z79899 Other long term (current) drug therapy: Secondary | ICD-10-CM | POA: Insufficient documentation

## 2019-09-25 DIAGNOSIS — E119 Type 2 diabetes mellitus without complications: Secondary | ICD-10-CM | POA: Diagnosis not present

## 2019-09-25 DIAGNOSIS — Z7982 Long term (current) use of aspirin: Secondary | ICD-10-CM | POA: Diagnosis not present

## 2019-09-25 DIAGNOSIS — M8580 Other specified disorders of bone density and structure, unspecified site: Secondary | ICD-10-CM | POA: Diagnosis not present

## 2019-09-25 DIAGNOSIS — Z5111 Encounter for antineoplastic chemotherapy: Secondary | ICD-10-CM | POA: Insufficient documentation

## 2019-09-25 DIAGNOSIS — Z7189 Other specified counseling: Secondary | ICD-10-CM

## 2019-09-25 DIAGNOSIS — R634 Abnormal weight loss: Secondary | ICD-10-CM

## 2019-09-25 LAB — BILIRUBIN, DIRECT: Bilirubin, Direct: 0.5 mg/dL — ABNORMAL HIGH (ref 0.0–0.2)

## 2019-09-25 LAB — PROTIME-INR
INR: 1 (ref 0.8–1.2)
Prothrombin Time: 12.4 seconds (ref 11.4–15.2)

## 2019-09-25 NOTE — Telephone Encounter (Signed)
Faxed port placement order over to [facility] @ [phone]

## 2019-09-26 ENCOUNTER — Telehealth (INDEPENDENT_AMBULATORY_CARE_PROVIDER_SITE_OTHER): Payer: Self-pay

## 2019-09-26 ENCOUNTER — Other Ambulatory Visit
Admission: RE | Admit: 2019-09-26 | Discharge: 2019-09-26 | Disposition: A | Discharge status: Home / Self Care | Payer: Medicare Other | Source: Ambulatory Visit | Attending: Vascular Surgery | Admitting: Vascular Surgery

## 2019-09-26 ENCOUNTER — Inpatient Hospital Stay: Payer: Medicare Other

## 2019-09-26 ENCOUNTER — Telehealth: Payer: Self-pay | Admitting: Hematology and Oncology

## 2019-09-26 VITALS — BP 106/68 | HR 69 | Temp 97.1°F | Resp 18

## 2019-09-26 DIAGNOSIS — Z20822 Contact with and (suspected) exposure to covid-19: Secondary | ICD-10-CM | POA: Diagnosis not present

## 2019-09-26 DIAGNOSIS — C7951 Secondary malignant neoplasm of bone: Secondary | ICD-10-CM

## 2019-09-26 DIAGNOSIS — Z5111 Encounter for antineoplastic chemotherapy: Secondary | ICD-10-CM | POA: Diagnosis not present

## 2019-09-26 DIAGNOSIS — Z01812 Encounter for preprocedural laboratory examination: Secondary | ICD-10-CM | POA: Insufficient documentation

## 2019-09-26 LAB — COMPREHENSIVE METABOLIC PANEL
ALT: 226 U/L — ABNORMAL HIGH (ref 0–44)
AST: 239 U/L — ABNORMAL HIGH (ref 15–41)
Albumin: 3.3 g/dL — ABNORMAL LOW (ref 3.5–5.0)
Alkaline Phosphatase: 669 U/L — ABNORMAL HIGH (ref 38–126)
Anion gap: 13 (ref 5–15)
BUN: 24 mg/dL — ABNORMAL HIGH (ref 8–23)
CO2: 25 mmol/L (ref 22–32)
Calcium: 11.1 mg/dL — ABNORMAL HIGH (ref 8.9–10.3)
Chloride: 101 mmol/L (ref 98–111)
Creatinine, Ser: 1.09 mg/dL — ABNORMAL HIGH (ref 0.44–1.00)
GFR calc Af Amer: 58 mL/min — ABNORMAL LOW (ref >60)
GFR calc non Af Amer: 50 mL/min — ABNORMAL LOW (ref >60)
Glucose, Bld: 108 mg/dL — ABNORMAL HIGH (ref 70–99)
Potassium: 4.3 mmol/L (ref 3.5–5.1)
Sodium: 139 mmol/L (ref 135–145)
Total Bilirubin: 1.5 mg/dL — ABNORMAL HIGH (ref 0.3–1.2)
Total Protein: 7.1 g/dL (ref 6.5–8.1)

## 2019-09-26 LAB — SARS CORONAVIRUS 2 (TAT 6-24 HRS): SARS Coronavirus 2: NEGATIVE

## 2019-09-26 MED ORDER — ZOLEDRONIC ACID 4 MG/100ML IV SOLN
4.0000 mg | Freq: Once | INTRAVENOUS | Status: AC
  Administered 2019-09-26: 4 mg via INTRAVENOUS
  Filled 2019-09-26: qty 100

## 2019-09-26 MED ORDER — SODIUM CHLORIDE 0.9 % IV SOLN
Freq: Once | INTRAVENOUS | Status: AC
  Filled 2019-09-26: qty 250

## 2019-09-26 NOTE — Telephone Encounter (Signed)
Re:  Hypercalcemia  Called to discuss patient's calcium of 11.1 which corrects to 11.695 (based on albumen).  Etiology is likely secondary to malignancy.  Symptoms of hypercalcemia reviewed.  Patient has been authorized for Zometa.  Discussed potential side effects.  Patient denies any dental issues.  She has full dentures.  Patient wishes to proceed with treatment today.   Lequita Asal, MD

## 2019-09-26 NOTE — Telephone Encounter (Signed)
Spoke with the patient and he she is scheduled with Dr. Lucky Cowboy for a port placement on 09/28/19 with a 1:30 pm arrival time to the [facility]. Covid testing today (09/26/19) between 8-1 pm at the [facility]. Pre-procedure instructions were discussed, being NPO, having someone with her and medications.

## 2019-09-27 ENCOUNTER — Other Ambulatory Visit: Payer: Self-pay | Admitting: Hematology and Oncology

## 2019-09-27 ENCOUNTER — Telehealth: Payer: Self-pay | Admitting: *Deleted

## 2019-09-27 ENCOUNTER — Other Ambulatory Visit (INDEPENDENT_AMBULATORY_CARE_PROVIDER_SITE_OTHER): Payer: Self-pay | Admitting: Nurse Practitioner

## 2019-09-27 DIAGNOSIS — C787 Secondary malignant neoplasm of liver and intrahepatic bile duct: Secondary | ICD-10-CM

## 2019-09-27 DIAGNOSIS — C7951 Secondary malignant neoplasm of bone: Secondary | ICD-10-CM

## 2019-09-27 DIAGNOSIS — C50812 Malignant neoplasm of overlapping sites of left female breast: Secondary | ICD-10-CM

## 2019-09-27 DIAGNOSIS — Z170 Estrogen receptor positive status [ER+]: Secondary | ICD-10-CM

## 2019-09-27 LAB — SURGICAL PATHOLOGY

## 2019-09-27 MED ORDER — ONDANSETRON HCL 8 MG PO TABS: 8.0000 mg | ORAL_TABLET | Freq: Three times a day (TID) | ORAL | 1 refills | Status: DC | PRN

## 2019-09-27 NOTE — Telephone Encounter (Signed)
Dr. Elgie Collard is rheumatologist treating this patient she can be reached at [phone].

## 2019-09-27 NOTE — Progress Notes (Signed)
START ON PATHWAY REGIMEN - Breast     A cycle is every 28 days (3 weeks on and 1 week off):     Paclitaxel   **Always confirm dose/schedule in your pharmacy ordering system**  Patient Characteristics: Distant Metastases or Locoregional Recurrent Disease - Unresected or Locally Advanced Unresectable Disease Progressing after Neoadjuvant and Local Therapies, HER2 Negative/Unknown/Equivocal, ER Positive, Chemotherapy, First Line Therapeutic Status: Distant Metastases ER Status: Positive (+) HER2 Status: Negative (-) PR Status: Negative (-) Therapy Approach Indicated: Standard Chemotherapy/Endocrine Therapy Line of Therapy: First Line Intent of Therapy: Non-Curative / Palliative Intent, Discussed with Patient 

## 2019-09-28 ENCOUNTER — Encounter
Admission: EM | Disposition: A | Discharge status: Home / Self Care | Payer: Self-pay | Source: Home / Self Care | Attending: Emergency Medicine

## 2019-09-28 ENCOUNTER — Other Ambulatory Visit: Payer: Self-pay

## 2019-09-28 ENCOUNTER — Ambulatory Visit: Admission: RE | Admit: 2019-09-28 | Payer: Medicare Other | Source: Home / Self Care | Admitting: Vascular Surgery

## 2019-09-28 ENCOUNTER — Emergency Department: Payer: Medicare Other

## 2019-09-28 ENCOUNTER — Observation Stay (HOSPITAL_BASED_OUTPATIENT_CLINIC_OR_DEPARTMENT_OTHER)
Admission: EM | Admit: 2019-09-28 | Discharge: 2019-09-29 | Disposition: A | Discharge status: Home / Self Care | Payer: Medicare Other | Source: Home / Self Care | Attending: Emergency Medicine | Admitting: Emergency Medicine

## 2019-09-28 ENCOUNTER — Ambulatory Visit: Admission: RE | Admit: 2019-09-28 | Payer: Medicare Other | Source: Ambulatory Visit

## 2019-09-28 DIAGNOSIS — R7989 Other specified abnormal findings of blood chemistry: Secondary | ICD-10-CM | POA: Diagnosis present

## 2019-09-28 DIAGNOSIS — R55 Syncope and collapse: Secondary | ICD-10-CM | POA: Insufficient documentation

## 2019-09-28 DIAGNOSIS — Z20822 Contact with and (suspected) exposure to covid-19: Secondary | ICD-10-CM | POA: Insufficient documentation

## 2019-09-28 DIAGNOSIS — R002 Palpitations: Secondary | ICD-10-CM | POA: Insufficient documentation

## 2019-09-28 DIAGNOSIS — I959 Hypotension, unspecified: Secondary | ICD-10-CM | POA: Diagnosis present

## 2019-09-28 DIAGNOSIS — C787 Secondary malignant neoplasm of liver and intrahepatic bile duct: Secondary | ICD-10-CM | POA: Diagnosis present

## 2019-09-28 DIAGNOSIS — N179 Acute kidney failure, unspecified: Secondary | ICD-10-CM

## 2019-09-28 DIAGNOSIS — Z87891 Personal history of nicotine dependence: Secondary | ICD-10-CM | POA: Insufficient documentation

## 2019-09-28 DIAGNOSIS — Z853 Personal history of malignant neoplasm of breast: Secondary | ICD-10-CM | POA: Insufficient documentation

## 2019-09-28 DIAGNOSIS — R17 Unspecified jaundice: Secondary | ICD-10-CM | POA: Insufficient documentation

## 2019-09-28 DIAGNOSIS — R1011 Right upper quadrant pain: Secondary | ICD-10-CM | POA: Insufficient documentation

## 2019-09-28 DIAGNOSIS — Z7982 Long term (current) use of aspirin: Secondary | ICD-10-CM | POA: Insufficient documentation

## 2019-09-28 DIAGNOSIS — Z96653 Presence of artificial knee joint, bilateral: Secondary | ICD-10-CM | POA: Insufficient documentation

## 2019-09-28 DIAGNOSIS — R109 Unspecified abdominal pain: Secondary | ICD-10-CM

## 2019-09-28 DIAGNOSIS — E785 Hyperlipidemia, unspecified: Secondary | ICD-10-CM | POA: Diagnosis present

## 2019-09-28 DIAGNOSIS — Z170 Estrogen receptor positive status [ER+]: Secondary | ICD-10-CM

## 2019-09-28 DIAGNOSIS — N1831 Chronic kidney disease, stage 3a: Secondary | ICD-10-CM | POA: Diagnosis present

## 2019-09-28 DIAGNOSIS — I10 Essential (primary) hypertension: Secondary | ICD-10-CM | POA: Insufficient documentation

## 2019-09-28 DIAGNOSIS — E119 Type 2 diabetes mellitus without complications: Secondary | ICD-10-CM | POA: Insufficient documentation

## 2019-09-28 DIAGNOSIS — I2510 Atherosclerotic heart disease of native coronary artery without angina pectoris: Secondary | ICD-10-CM | POA: Insufficient documentation

## 2019-09-28 DIAGNOSIS — Z7984 Long term (current) use of oral hypoglycemic drugs: Secondary | ICD-10-CM | POA: Insufficient documentation

## 2019-09-28 DIAGNOSIS — C50812 Malignant neoplasm of overlapping sites of left female breast: Secondary | ICD-10-CM

## 2019-09-28 DIAGNOSIS — C50019 Malignant neoplasm of nipple and areola, unspecified female breast: Secondary | ICD-10-CM

## 2019-09-28 DIAGNOSIS — Z79899 Other long term (current) drug therapy: Secondary | ICD-10-CM | POA: Insufficient documentation

## 2019-09-28 DIAGNOSIS — R778 Other specified abnormalities of plasma proteins: Secondary | ICD-10-CM | POA: Diagnosis present

## 2019-09-28 DIAGNOSIS — C220 Liver cell carcinoma: Secondary | ICD-10-CM | POA: Insufficient documentation

## 2019-09-28 DIAGNOSIS — C7951 Secondary malignant neoplasm of bone: Secondary | ICD-10-CM | POA: Diagnosis present

## 2019-09-28 LAB — URINALYSIS, COMPLETE (UACMP) WITH MICROSCOPIC
Bacteria, UA: NONE SEEN
Bilirubin Urine: NEGATIVE
Glucose, UA: NEGATIVE mg/dL
Hgb urine dipstick: NEGATIVE
Ketones, ur: NEGATIVE mg/dL
Leukocytes,Ua: NEGATIVE
Nitrite: NEGATIVE
Protein, ur: NEGATIVE mg/dL
Specific Gravity, Urine: >1.046 — ABNORMAL HIGH (ref 1.005–1.030)
pH: 5 (ref 5.0–8.0)

## 2019-09-28 LAB — GLUCOSE, CAPILLARY
Glucose-Capillary: 108 mg/dL — ABNORMAL HIGH (ref 70–99)
Glucose-Capillary: 171 mg/dL — ABNORMAL HIGH (ref 70–99)

## 2019-09-28 LAB — CBC WITH DIFFERENTIAL/PLATELET
Abs Immature Granulocytes: 0.12 10*3/uL — ABNORMAL HIGH (ref 0.00–0.07)
Basophils Absolute: 0.1 10*3/uL (ref 0.0–0.1)
Basophils Relative: 1 %
Eosinophils Absolute: 0 10*3/uL (ref 0.0–0.5)
Eosinophils Relative: 0 %
HCT: 39.1 % (ref 36.0–46.0)
Hemoglobin: 13.2 g/dL (ref 12.0–15.0)
Immature Granulocytes: 1 %
Lymphocytes Relative: 7 %
Lymphs Abs: 0.8 10*3/uL (ref 0.7–4.0)
MCH: 28.9 pg (ref 26.0–34.0)
MCHC: 33.8 g/dL (ref 30.0–36.0)
MCV: 85.6 fL (ref 80.0–100.0)
Monocytes Absolute: 0.7 10*3/uL (ref 0.1–1.0)
Monocytes Relative: 7 %
Neutro Abs: 9 10*3/uL — ABNORMAL HIGH (ref 1.7–7.7)
Neutrophils Relative %: 84 %
Platelets: 151 10*3/uL (ref 150–400)
RBC: 4.57 MIL/uL (ref 3.87–5.11)
RDW: 15.8 % — ABNORMAL HIGH (ref 11.5–15.5)
WBC: 10.6 10*3/uL — ABNORMAL HIGH (ref 4.0–10.5)
nRBC: 0.7 % — ABNORMAL HIGH (ref 0.0–0.2)

## 2019-09-28 LAB — LACTIC ACID, PLASMA
Lactic Acid, Venous: 2.3 mmol/L (ref 0.5–1.9)
Lactic Acid, Venous: 1.8 mmol/L (ref 0.5–1.9)

## 2019-09-28 LAB — SARS CORONAVIRUS 2 BY RT PCR (HOSPITAL ORDER, PERFORMED IN ~~LOC~~ HOSPITAL LAB): SARS Coronavirus 2: NEGATIVE

## 2019-09-28 LAB — COMPREHENSIVE METABOLIC PANEL
ALT: 177 U/L — ABNORMAL HIGH (ref 0–44)
AST: 225 U/L — ABNORMAL HIGH (ref 15–41)
Albumin: 3.1 g/dL — ABNORMAL LOW (ref 3.5–5.0)
Alkaline Phosphatase: 669 U/L — ABNORMAL HIGH (ref 38–126)
Anion gap: 11 (ref 5–15)
BUN: 22 mg/dL (ref 8–23)
CO2: 26 mmol/L (ref 22–32)
Calcium: 9.9 mg/dL (ref 8.9–10.3)
Chloride: 104 mmol/L (ref 98–111)
Creatinine, Ser: 1.41 mg/dL — ABNORMAL HIGH (ref 0.44–1.00)
GFR calc Af Amer: 42 mL/min — ABNORMAL LOW (ref >60)
GFR calc non Af Amer: 36 mL/min — ABNORMAL LOW (ref >60)
Glucose, Bld: 126 mg/dL — ABNORMAL HIGH (ref 70–99)
Potassium: 4.1 mmol/L (ref 3.5–5.1)
Sodium: 141 mmol/L (ref 135–145)
Total Bilirubin: 2.3 mg/dL — ABNORMAL HIGH (ref 0.3–1.2)
Total Protein: 6.3 g/dL — ABNORMAL LOW (ref 6.5–8.1)

## 2019-09-28 LAB — TROPONIN I (HIGH SENSITIVITY)
Troponin I (High Sensitivity): 31 ng/L — ABNORMAL HIGH (ref <18)
Troponin I (High Sensitivity): 26 ng/L — ABNORMAL HIGH (ref <18)

## 2019-09-28 LAB — HEMOGLOBIN A1C
Hgb A1c MFr Bld: 6.9 % — ABNORMAL HIGH (ref 4.8–5.6)
Mean Plasma Glucose: 151.33 mg/dL

## 2019-09-28 SURGERY — PORTA CATH INSERTION
Anesthesia: Moderate Sedation

## 2019-09-28 MED ORDER — INSULIN ASPART 100 UNIT/ML ~~LOC~~ SOLN: 0.0000 [IU] | Freq: Every day | SUBCUTANEOUS | Status: DC

## 2019-09-28 MED ORDER — SULFAMETHOXAZOLE-TRIMETHOPRIM 800-160 MG PO TABS: 1.0000 | ORAL_TABLET | ORAL | Status: DC

## 2019-09-28 MED ORDER — SODIUM CHLORIDE 0.9 % IV SOLN: INTRAVENOUS | Status: DC

## 2019-09-28 MED ORDER — ATORVASTATIN CALCIUM 20 MG PO TABS
80.0000 mg | ORAL_TABLET | Freq: Every day | ORAL | Status: DC
  Administered 2019-09-28: 80 mg via ORAL
  Filled 2019-09-28: qty 4

## 2019-09-28 MED ORDER — COQ10 100 MG PO CAPS: 100.0000 mg | ORAL_CAPSULE | Freq: Every day | ORAL | Status: DC

## 2019-09-28 MED ORDER — BIOTIN 5 MG PO CAPS: 5.0000 mg | ORAL_CAPSULE | Freq: Every day | ORAL | Status: DC

## 2019-09-28 MED ORDER — ENOXAPARIN SODIUM 40 MG/0.4ML ~~LOC~~ SOLN: 40.0000 mg | SUBCUTANEOUS | Status: DC

## 2019-09-28 MED ORDER — VITAMIN D 25 MCG (1000 UNIT) PO TABS
5000.0000 [IU] | ORAL_TABLET | Freq: Two times a day (BID) | ORAL | Status: DC
  Administered 2019-09-28 – 2019-09-29 (×2): 5000 [IU] via ORAL
  Filled 2019-09-28 (×2): qty 5

## 2019-09-28 MED ORDER — ONDANSETRON HCL 4 MG/2ML IJ SOLN: 4.0000 mg | Freq: Three times a day (TID) | INTRAMUSCULAR | Status: DC | PRN

## 2019-09-28 MED ORDER — IOHEXOL 300 MG/ML  SOLN
75.0000 mL | Freq: Once | INTRAMUSCULAR | Status: AC | PRN
  Administered 2019-09-28: 75 mL via INTRAVENOUS

## 2019-09-28 MED ORDER — HYDROCORTISONE NA SUCCINATE PF 100 MG IJ SOLR
100.0000 mg | Freq: Once | INTRAMUSCULAR | Status: AC
  Administered 2019-09-28: 100 mg via INTRAVENOUS
  Filled 2019-09-28: qty 2

## 2019-09-28 MED ORDER — SODIUM CHLORIDE 0.9 % IV BOLUS
1000.0000 mL | Freq: Once | INTRAVENOUS | Status: AC
  Administered 2019-09-28: 1000 mL via INTRAVENOUS

## 2019-09-28 MED ORDER — ASCORBIC ACID 500 MG PO TABS
1000.0000 mg | ORAL_TABLET | Freq: Every day | ORAL | Status: DC
  Administered 2019-09-28 – 2019-09-29 (×2): 1000 mg via ORAL
  Filled 2019-09-28 (×2): qty 2

## 2019-09-28 MED ORDER — PREDNISONE 50 MG PO TABS: 50.0000 mg | ORAL_TABLET | Freq: Every day | ORAL | Status: DC

## 2019-09-28 MED ORDER — ASPIRIN EC 81 MG PO TBEC
81.0000 mg | DELAYED_RELEASE_TABLET | Freq: Every day | ORAL | Status: DC
  Administered 2019-09-28 – 2019-09-29 (×2): 81 mg via ORAL
  Filled 2019-09-28 (×2): qty 1

## 2019-09-28 MED ORDER — ALENDRONATE SODIUM 70 MG PO TABS: 70.0000 mg | ORAL_TABLET | ORAL | Status: DC

## 2019-09-28 MED ORDER — INSULIN ASPART 100 UNIT/ML ~~LOC~~ SOLN: 0.0000 [IU] | Freq: Three times a day (TID) | SUBCUTANEOUS | Status: DC

## 2019-09-28 MED ORDER — IOHEXOL 9 MG/ML PO SOLN
1000.0000 mL | Freq: Once | ORAL | Status: AC | PRN
  Administered 2019-09-28: 1000 mL via ORAL

## 2019-09-28 NOTE — ED Notes (Signed)
Patient given warm blankets.

## 2019-09-28 NOTE — H&P (Signed)
History and Physical    Faith [name] DOB: 03-18-1945 DOA: 09/28/2019  Referring MD/NP/PA:   PCP: Leonel Ramsay, MD   Patient coming from:  The patient is coming from home.  At baseline, pt is independent for most of ADL.        Chief Complaint: Generalized weakness, near syncope  HPI: Faith [name] is a 74 y.o. female with medical history significant of hypertension, hyperlipidemia, diabetes mellitus, CAD, myocardial infarction, breast cancer (s/p of bilateral mastectomy, chemotherapy), bone and liver metastasized disease, CKD stage III, arthritis, who presents with generalized weakness and near syncope.  Patient states that she has generalized weakness for about 1 week, which has been progressively worsening.  Patient went to endocrinologist's office for regular checkup of her DM, and was found to have hypotension with blood pressure 72/50, which improved to 106/60 after giving 600 normal saline bolus.  Patient states that she felt going to be passing out, but did not.  No unilateral numbness or tingling symptoms in extremities.  No facial droop or slurred speech.  Patient denies chest pain, shortness breath, cough, fever or chills.  Patient has mild right upper quadrant abdominal pain and nausea, but denies vomiting, diarrhea.  No symptoms of UTI.  ED Course: pt was found to have negative Covid PCR, WBC 10.6, lactic acid 2.3, 1.8, troponin 31, 26, worsening renal function, abnormal liver function (ALP 669, AST 225, ALT 177, total bilirubin 2.3), temperature normal, blood pressure 118/40, HR 66, RR 22, oxygen saturation 95% on 2 L oxygen.  Patient is placed on MedSurg bed for position   CT of chest and abdomen/pelvis showed: 1. No acute intrathoracic process. No acute intra-abdominal process. 2. Unchanged hepatic and osseous metastases. Unchanged suspected small right adrenal metastasis. 3. New 3 mm pulmonary nodule in the peripheral left lower lobe, indeterminate.  Attention on follow-up. 4. New trace ascites. 5. Aortic Atherosclerosis (ICD10-I70.0).   Review of Systems:   General: no fevers, chills, no body weight gain, has poor appetite, has fatigue HEENT: no blurry vision, hearing changes or sore throat Respiratory: no dyspnea, coughing, wheezing CV: no chest pain, no palpitations GI: has nausea, abdominal pain, no diarrhea, constipation, vomiting,  GU: no dysuria, burning on urination, increased urinary frequency, hematuria  Ext: no leg edema Neuro: no unilateral weakness, numbness, or tingling, no vision change or hearing loss Skin: no rash, no skin tear. MSK: No muscle spasm, no deformity, no limitation of range of movement in spin Heme: No easy bruising.  Travel history: No recent long distant travel.  Allergy:  Allergies  Allergen Reactions  . Mushroom Extract Complex Hives and Swelling  . Nickel Rash  . Shellfish Allergy Nausea And Vomiting    Past Medical History:  Diagnosis Date  . Arthritis    bilateral knees  . Cancer [facility]) April 2015   left breast  . Coronary artery disease   . Diabetes mellitus without complication ([place])   . Hyperlipidemia   . Hypertension   . Myocardial infarction ([place]) 11/03/2015   [facility], stent placed  . Pneumonia     Past Surgical History:  Procedure Laterality Date  . BREAST SURGERY Bilateral 11/19/13   bilater mastectomy   . CARDIAC CATHETERIZATION    . CORONARY ANGIOPLASTY    . PORT-A-CATH REMOVAL  2016  . PORTACATH PLACEMENT  06-07-13  . SHOULDER OPEN ROTATOR CUFF REPAIR  2012  . TOTAL KNEE ARTHROPLASTY Left 03/28/2018   Procedure: TOTAL KNEE ARTHROPLASTY-LEFT;  Surgeon: Hessie Knows,  MD;  Location: [facility] ORS;  Service: Orthopedics;  Laterality: Left;  . TOTAL KNEE ARTHROPLASTY Right 08/01/2018   Procedure: RIGHT TOTAL KNEE ARTHROPLASTY;  Surgeon: Hessie Knows, MD;  Location: [facility] ORS;  Service: Orthopedics;  Laterality: Right;  . WRIST SURGERY Left 1969   cyst    Social History:   reports that she quit smoking about 34 years ago. Her smoking use included cigarettes. She has a 15.00 pack-year smoking history. She has never used smokeless tobacco. She reports current alcohol use. She reports that she does not use drugs.  Family History:  Family History  Adopted: Yes     Prior to Admission medications   Medication Sig Start Date End Date Taking? Authorizing Provider  alendronate (FOSAMAX) 70 MG tablet 70 mg once a week.    Yes [provider]  Ascorbic Acid (VITAMIN C) 1000 MG tablet Take 1,000 mg by mouth daily.   Yes [provider]  aspirin EC 81 MG tablet Take 81 mg by mouth daily.   Yes [provider]  atorvastatin (LIPITOR) 80 MG tablet Take 80 mg by mouth daily at 6 PM.  02/09/16  Yes [provider]  Biotin 5 MG CAPS Take 5 mg by mouth 2 (two) times a day.   Yes [provider]  Coenzyme Q10 (COQ10) 100 MG CAPS Take 100 mg by mouth daily.   Yes [provider]  insulin lispro (HUMALOG) 100 UNIT/ML KwikPen Inject 18 Units into the skin 3 (three) times daily before meals.  08/03/19 08/02/20 Yes [provider]  losartan (COZAAR) 50 MG tablet Take 50 mg by mouth daily.    Yes [provider]  predniSONE (DELTASONE) 10 MG tablet Take 25 mg by mouth daily.    Yes [provider]  sulfamethoxazole-trimethoprim (BACTRIM DS) 800-160 MG tablet Take 1 tablet by mouth 3 (three) times a week. 08/02/19  Yes [provider]  Calcium Carb-Cholecalciferol (CALCIUM 600+D3 PO) Take 1 tablet by mouth 2 (two) times a day.    [provider]  Cholecalciferol 125 MCG (5000 UT) TABS Take 1 capsule by mouth 2 (two) times daily.    [provider]  Denosumab (XGEVA [place]) Inject into the skin.    [provider]  metoprolol tartrate (LOPRESSOR) 25 MG tablet Take 25 mg by mouth 2 (two) times daily.  11/05/15 07/27/26  [provider]  ondansetron (ZOFRAN) 8 MG tablet Take 1  tablet (8 mg total) by mouth every 8 (eight) hours as needed (Nausea or vomiting). 09/27/19   Lequita Asal, MD  Tocilizumab (ACTEMRA [place]) Inject into the skin.    [provider]    Physical Exam: Vitals:   09/28/19 1050 09/28/19 1630 09/28/19 1700 09/28/19 1740  BP:  (!) 144/54 (!) 142/57 (!) 150/58  Pulse: 66 69 72 72  Resp: 15   19  Temp:      TempSrc:      SpO2: 95% 94% 93% 96%  Weight: 88.9 kg     Height: 5\' 7"  (1.702 m)      General: Not in acute distress HEENT:       Eyes: PERRL, EOMI, no scleral icterus.       ENT: No discharge from the ears and nose, no pharynx injection, no tonsillar enlargement.        Neck: No JVD, no bruit, no mass felt. Heme: No neck lymph node enlargement. Cardiac: S1/S2, RRR, No murmurs, No gallops or rubs. Respiratory: No rales, wheezing,  rhonchi or rubs. GI: Soft, mildly distended, tender in RUQ, no rebound pain, no organomegaly, BS present. GU: No hematuria Ext: No pitting leg edema bilaterally. 2+DP/PT pulse bilaterally. Musculoskeletal: No joint deformities, No joint redness or warmth, no limitation of ROM in spin. Skin: No rashes.  Neuro: Alert, oriented X3, cranial nerves II-XII grossly intact, moves all extremities normally.  Psych: Patient is not psychotic, no suicidal or hemocidal ideation.  Labs on Admission: I have personally reviewed following labs and imaging studies  CBC: Recent Labs  Lab 09/28/19 1051  WBC 10.6*  NEUTROABS 9.0*  HGB 13.2  HCT 39.1  MCV 85.6  PLT 287   Basic Metabolic Panel: Recent Labs  Lab 09/25/19 1428 09/28/19 1051  NA 139 141  K 4.3 4.1  CL 101 104  CO2 25 26  GLUCOSE 108* 126*  BUN 24* 22  CREATININE 1.09* 1.41*  CALCIUM 11.1* 9.9   GFR: Estimated Creatinine Clearance: 39.5 mL/min (A) (by C-G formula based on SCr of 1.41 mg/dL (H)). Liver Function Tests: Recent Labs  Lab 09/25/19 1428 09/28/19 1051  AST 239* 225*  ALT 226* 177*  ALKPHOS 669* 669*  BILITOT 1.5* 2.3*    PROT 7.1 6.3*  ALBUMIN 3.3* 3.1*   No results for input(s): LIPASE, AMYLASE in the last 168 hours. No results for input(s): AMMONIA in the last 168 hours. Coagulation Profile: Recent Labs  Lab 09/25/19 1428  INR 1.0   Cardiac Enzymes: No results for input(s): CKTOTAL, CKMB, CKMBINDEX, TROPONINI in the last 168 hours. BNP (last 3 results) No results for input(s): PROBNP in the last 8760 hours. HbA1C: No results for input(s): HGBA1C in the last 72 hours. CBG: Recent Labs  Lab 09/28/19 1759  GLUCAP 108*   Lipid Profile: No results for input(s): CHOL, HDL, LDLCALC, TRIG, CHOLHDL, LDLDIRECT in the last 72 hours. Thyroid Function Tests: No results for input(s): TSH, T4TOTAL, FREET4, T3FREE, THYROIDAB in the last 72 hours. Anemia Panel: No results for input(s): VITAMINB12, FOLATE, FERRITIN, TIBC, IRON, RETICCTPCT in the last 72 hours. Urine analysis:    Component Value Date/Time   COLORURINE YELLOW (A) 09/28/2019 1436   APPEARANCEUR HAZY (A) 09/28/2019 1436   APPEARANCEUR HAZY 04/12/2013 0851   LABSPEC >1.046 (H) 09/28/2019 1436   LABSPEC 1.025 04/12/2013 0851   PHURINE 5.0 09/28/2019 1436   GLUCOSEU NEGATIVE 09/28/2019 1436   GLUCOSEU NEGATIVE 04/12/2013 0851   HGBUR NEGATIVE 09/28/2019 1436   BILIRUBINUR NEGATIVE 09/28/2019 1436   BILIRUBINUR 3+ 04/12/2013 [place] 09/28/2019 1436   PROTEINUR NEGATIVE 09/28/2019 1436   NITRITE NEGATIVE 09/28/2019 1436   LEUKOCYTESUR NEGATIVE 09/28/2019 1436   LEUKOCYTESUR NEGATIVE 04/12/2013 0851   Sepsis Labs: @LABRCNTIP (procalcitonin:4,lacticidven:4) ) Recent Results (from the past 240 hour(s))  SARS CORONAVIRUS 2 (TAT 6-24 HRS) Nasopharyngeal Nasopharyngeal Swab     Status: None   Collection Time: 09/26/19 11:21 AM   Specimen: Nasopharyngeal Swab  Result Value Ref Range Status   SARS Coronavirus 2 NEGATIVE NEGATIVE Final    Comment: (NOTE) SARS-CoV-2 target nucleic acids are NOT DETECTED.  The SARS-CoV-2  RNA is generally detectable in upper and lower respiratory specimens during the acute phase of infection. Negative results do not preclude SARS-CoV-2 infection, do not rule out co-infections with other pathogens, and should not be used as the sole basis for treatment or other patient management decisions. Negative results must be combined with clinical observations, patient history, and epidemiological information. The expected result is Negative.  Fact Sheet for Patients: SugarRoll.be  Fact Sheet for Healthcare Providers: https://www.woods-mathews.com/  This test is not yet approved or cleared by the [place] FDA and  has been authorized for detection and/or diagnosis of SARS-CoV-2 by FDA under an Emergency Use Authorization (EUA). This EUA will remain  in effect (meaning this test can be used) for the duration of the COVID-19 declaration under Se ction 564(b)(1) of the Act, 21 U.S.C. section 360bbb-3(b)(1), unless the authorization is terminated or revoked sooner.  Performed at [facility], [place] [address], [place], [place] [address]   SARS Coronavirus 2 by RT PCR (hospital order, performed in [facility] hospital lab) Nasopharyngeal Nasopharyngeal Swab     Status: None   Collection Time: 09/28/19 10:51 AM   Specimen: Nasopharyngeal Swab  Result Value Ref Range Status   SARS Coronavirus 2 NEGATIVE NEGATIVE Final    Comment: (NOTE) SARS-CoV-2 target nucleic acids are NOT DETECTED.  The SARS-CoV-2 RNA is generally detectable in upper and lower respiratory specimens during the acute phase of infection. The lowest concentration of SARS-CoV-2 viral copies this assay can detect is 250 copies / mL. A negative result does not preclude SARS-CoV-2 infection and should not be used as the sole basis for treatment or other patient management decisions.  A negative result may occur with improper specimen collection / handling, submission  of specimen other than nasopharyngeal swab, presence of viral mutation(s) within the areas targeted by this assay, and inadequate number of viral copies (<250 copies / mL). A negative result must be combined with clinical observations, patient history, and epidemiological information.  Fact Sheet for Patients:   StrictlyIdeas.no  Fact Sheet for Healthcare Providers: BankingDealers.co.za  This test is not yet approved or  cleared by the [place] FDA and has been authorized for detection and/or diagnosis of SARS-CoV-2 by FDA under an Emergency Use Authorization (EUA).  This EUA will remain in effect (meaning this test can be used) for the duration of the COVID-19 declaration under Section 564(b)(1) of the Act, 21 U.S.C. section 360bbb-3(b)(1), unless the authorization is terminated or revoked sooner.  Performed at [facility], [address], [place], [place] [address]      Radiological Exams on Admission: CT CHEST ABDOMEN PELVIS W CONTRAST  Result Date: 09/28/2019 CLINICAL DATA:  Weakness and hypotension.  Metastatic breast cancer. EXAM: CT CHEST, ABDOMEN, AND PELVIS WITH CONTRAST TECHNIQUE: Multidetector CT imaging of the chest, abdomen and pelvis was performed following the standard protocol during bolus administration of intravenous contrast. CONTRAST:  90mL OMNIPAQUE IOHEXOL 300 MG/ML  SOLN COMPARISON:  CT abdomen dated September 06, 2019. CT chest dated September 07, 2018. FINDINGS: CT CHEST FINDINGS Cardiovascular: No significant vascular findings. Normal heart size. No pericardial effusion. No thoracic aortic aneurysm. Coronary, aortic arch, and branch vessel atherosclerotic vascular disease. Mediastinum/Nodes: No enlarged mediastinal, hilar, or axillary lymph nodes. Prior left axillary lymph node dissection. Unchanged 7 mm hypodense nodule in the right thyroid lobe. Not clinically significant; no follow-up imaging recommended.  Trachea and esophagus demonstrate no significant findings. Lungs/Pleura: No focal consolidation, pleural effusion, or pneumothorax. New 3 mm nodule in the peripheral left lower lobe (series 4, image 107). No additional pulmonary nodules. Musculoskeletal: Innumerable patchy indistinct small sclerotic foci throughout the thoracic spine, sternum, and ribs, new since August 2020. Prior bilateral mastectomies. CT ABDOMEN PELVIS FINDINGS Hepatobiliary: Innumerable confluent hypoenhancing liver masses scattered throughout the liver are similar to recent CT. New trace perihepatic ascites. No gallstones or gallbladder wall thickening. No biliary dilatation. Pancreas: Unremarkable. No  pancreatic ductal dilatation or surrounding inflammatory changes. Spleen: Normal in size without focal abnormality. Adrenals/Urinary Tract: Unchanged 1.2 cm right adrenal nodule. The left adrenal gland is unremarkable. Unchanged 3.9 cm simple cyst in the right kidney. No renal calculi or hydronephrosis. Bladder is unremarkable. Stomach/Bowel: Stomach is within normal limits. Appendix appears normal. No evidence of bowel wall thickening, distention, or inflammatory changes. Moderate colonic diverticulosis. Vascular/Lymphatic: Aortic atherosclerosis. No enlarged abdominal or pelvic lymph nodes. Reproductive: Calcified uterine fibroids.  No adnexal mass. Other: Trace free fluid in the pelvis. No pneumoperitoneum. Small fat containing umbilical hernia. Musculoskeletal: Innumerable patchy indistinct small sclerotic foci scattered throughout the lumbar spine and pelvis are again noted. IMPRESSION: 1. No acute intrathoracic process. No acute intra-abdominal process. 2. Unchanged hepatic and osseous metastases. Unchanged suspected small right adrenal metastasis. 3. New 3 mm pulmonary nodule in the peripheral left lower lobe, indeterminate. Attention on follow-up. 4. New trace ascites. 5. Aortic Atherosclerosis (ICD10-I70.0). Electronically Signed   By:  Titus Dubin M.D.   On: 09/28/2019 12:51     EKG: Independently reviewed.  Sinus rhythm, QTC 451, low voltage, borderline LAD, nonspecific T wave change  Assessment/Plan Principal Problem:   Hypotension Active Problems:   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 female, estrogen receptor positive (HCC)   Elevated LFTs   Bone metastasis (HCC)   Liver metastasis (HCC)   Near syncope   Hypertension   HLD (hyperlipidemia)   Acute renal failure superimposed on stage 3a chronic kidney disease (HCC)   Elevated troponin   CAD (coronary artery disease)   Hypotension: Etiology is not clear, possibly due to dehydration.  Potential differential diagnosis is adrenal insufficiency given metastasized disease in adrenal gland.  -Placed on MedSurg bed for observation -Solu-Cortef 100 mg stress dose -Increase home prednisone dose from 25 to 50 mg daily -IV fluid: 1 L normal saline followed by 75 cc/h  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 female, estrogen receptor positive ([place]) with  Bone metastasis and Liver: metastasis. Patient states that she is about to start chemotherapy on Monday for liver metastasized disease. -Follow-up with oncologist.    Elevated LFTs: Most likely due to liver metastasized disease -Avoid using Tylenol  Near syncope: Likely due to hypotension -IV fluid as above -PT/OT  Hypertension -Hold Cozaar and metoprolol due to hypotension  HLD (hyperlipidemia) -Lipitor  Acute renal failure superimposed on stage 3a chronic kidney disease ([place]): Baseline creatinine 1.0.  Her creatinine is 1.41, BUN 22.  Most likely due to dehydration and continuation of Cozaar.  ATN is also possible given hypotension -Hold Cozaar -IV fluid as above  Elevated troponin and CAD: Troponin 31 --> 26.  Already trending down.  No chest pain.  Likely due to demand ischemia. -Continue aspirin, Lipitor   DVT ppx: SQ Lovenox Code Status: Full code Family Communication:  not done, no family member is at bed side.      Disposition Plan:  Anticipate discharge back to previous environment Consults called:  none Admission status: Med-surg bed for obs   Status is: Observation  The patient remains OBS appropriate and will d/c before 2 midnights.  Dispo: The patient is from: Home              Anticipated d/c is to: Home              Anticipated d/c date is: 1 day              Patient currently is not medically  stable to d/c.          Date of Service 09/28/2019    Ivor Costa [facility]   If 7PM-7AM, please contact night-coverage [email or web address] 09/28/2019, 6:21 PM

## 2019-09-28 NOTE — Progress Notes (Signed)
PHARMACIST - PHYSICIAN COMMUNICATION  CONCERNING: P&T Medication Policy Regarding Oral Bisphosphonates  RECOMMENDATION: Your order for alendronate (Fosamax), ibandronate (Boniva), or risedronate (Actonel) has been discontinued at this time.  If the patient's post-hospital medical condition warrants safe use of this class of drugs, please resume the pre-hospital regimen upon discharge.  DESCRIPTION:  Alendronate (Fosamax), ibandronate (Boniva), and risedronate (Actonel) can cause severe esophageal erosions in patients who are unable to remain upright at least 30 minutes after taking this medication.   Since brief interruptions in therapy are thought to have minimal impact on bone mineral density, the [place] has established that bisphosphonate orders should be routinely discontinued during hospitalization.   To override this safety policy and permit administration of Boniva, Fosamax, or Actonel in the hospital, prescribers must write "DO NOT HOLD" in the comments section when placing the order for this class of medications.  Benn Moulder, PharmD Pharmacy Resident  09/28/2019 5:34 PM

## 2019-09-28 NOTE — ED Notes (Signed)
Report provided to Stephanie, RN

## 2019-09-28 NOTE — Progress Notes (Signed)
PHARMACIST - PHYSICIAN ORDER COMMUNICATION  CONCERNING: P&T Medication Policy on Herbal Medications  DESCRIPTION:  This patient's order for:  CoQ10 and biotin  has been noted.  This product(s) is classified as an "herbal" or natural product. Due to a lack of definitive safety studies or FDA approval, nonstandard manufacturing practices, plus the potential risk of unknown drug-drug interactions while on inpatient medications, the Pharmacy and Therapeutics Committee does not permit the use of "herbal" or natural products of this type within [facility].   ACTION TAKEN: The pharmacy department is unable to verify this order at this time and your patient has been informed of this safety policy. Please reevaluate patient's clinical condition at discharge and address if the herbal or natural product(s) should be resumed at that time.  Benn Moulder, PharmD Pharmacy Resident  09/28/2019 5:33 PM

## 2019-09-28 NOTE — ED Notes (Signed)
Patient eating dinner tray at this time. 

## 2019-09-28 NOTE — ED Notes (Signed)
Repositioned pt on bed, pt able to stand at bedside do a few steps to the right and sat again at edge of bed, pt denies feeling dizzy or lightheaded. Pt denies any needs at present

## 2019-09-28 NOTE — ED Provider Notes (Signed)
[facility] Emergency Department Provider Note   ____________________________________________   None    (approximate)  I have reviewed the triage vital signs and the nursing notes.   HISTORY  Chief Complaint Near Syncope    HPI Faith [name] is a 74 y.o. female patient was at [facility] urgent care.  She was to get a routine checkup today before having a chest port placed for treatment of her liver cancer.  She says she has been feeling weak for a week and today had near syncope with the blood pressure dropping down to 72/50.  EMS arrived gave her 600 of saline and her pressure went up to 401 systolic.  Patient reports she is feeling okay except for still feeling weak all over.  There is no focal weakness.  She has a little bit of pain in the right upper quadrant where her cancer is.  This is made worse by palpation but nothing else she reports she was to get a CT at [facility] to make sure there was not any cancer in the lungs.  Fingerstick was done by EMS and was over 100.         Past Medical History:  Diagnosis Date  . Arthritis    bilateral knees  . Cancer [facility]) April 2015   left breast  . Coronary artery disease   . Diabetes mellitus without complication ([place])   . Hyperlipidemia   . Hypertension   . Myocardial infarction ([place]) 11/03/2015   [facility], stent placed  . Pneumonia     Patient Active Problem List   Diagnosis Date Noted  . Elevated bilirubin 09/28/2019  . Liver metastasis ([place]) 09/25/2019  . Bone metastasis ([place]) 09/09/2019  . Elevated LFTs 09/06/2019  . Elevated ferritin 09/06/2019  . Liver masses 09/06/2019  . Weight loss 10/05/2018  . Osteopenia of lumbar spine 09/28/2018  . Status post total knee replacement using cement, right 08/01/2018  . Goals of care, counseling/discussion 06/30/2018  . Status post total knee replacement using cement, left 03/28/2018  . 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 female, estrogen receptor  positive ([place]) 09/09/2015    Past Surgical History:  Procedure Laterality Date  . BREAST SURGERY Bilateral 11/19/13   bilater mastectomy   . CARDIAC CATHETERIZATION    . CORONARY ANGIOPLASTY    . PORT-A-CATH REMOVAL  2016  . PORTACATH PLACEMENT  06-07-13  . SHOULDER OPEN ROTATOR CUFF REPAIR  2012  . TOTAL KNEE ARTHROPLASTY Left 03/28/2018   Procedure: TOTAL KNEE ARTHROPLASTY-LEFT;  Surgeon: Hessie Knows, MD;  Location: [facility] ORS;  Service: Orthopedics;  Laterality: Left;  . TOTAL KNEE ARTHROPLASTY Right 08/01/2018   Procedure: RIGHT TOTAL KNEE ARTHROPLASTY;  Surgeon: Hessie Knows, MD;  Location: [facility] ORS;  Service: Orthopedics;  Laterality: Right;  . WRIST SURGERY Left 1969   cyst    Prior to Admission medications   Medication Sig Start Date End Date Taking? Authorizing Provider  alendronate (FOSAMAX) 70 MG tablet 70 mg once a week.    Yes [provider]  Ascorbic Acid (VITAMIN C) 1000 MG tablet Take 1,000 mg by mouth daily.   Yes [provider]  aspirin EC 81 MG tablet Take 81 mg by mouth daily.   Yes [provider]  atorvastatin (LIPITOR) 80 MG tablet Take 80 mg by mouth daily at 6 PM.  02/09/16  Yes [provider]  Biotin 5 MG CAPS Take 5 mg by mouth 2 (two) times a day.  Yes [provider]  Coenzyme Q10 (COQ10) 100 MG CAPS Take 100 mg by mouth daily.   Yes [provider]  insulin lispro (HUMALOG) 100 UNIT/ML KwikPen Inject 18 Units into the skin 3 (three) times daily before meals.  08/03/19 08/02/20 Yes [provider]  losartan (COZAAR) 50 MG tablet Take 50 mg by mouth daily.    Yes [provider]  predniSONE (DELTASONE) 10 MG tablet Take 25 mg by mouth daily.    Yes [provider]  sulfamethoxazole-trimethoprim (BACTRIM DS) 800-160 MG tablet Take 1 tablet by mouth 3 (three) times a week. 08/02/19  Yes [provider]  Calcium Carb-Cholecalciferol (CALCIUM 600+D3 PO) Take 1 tablet by mouth  2 (two) times a day.    [provider]  Cholecalciferol 125 MCG (5000 UT) TABS Take 1 capsule by mouth 2 (two) times daily.    [provider]  Denosumab (XGEVA [place]) Inject into the skin.    [provider]  metoprolol tartrate (LOPRESSOR) 25 MG tablet Take 25 mg by mouth 2 (two) times daily.  11/05/15 07/27/26  [provider]  ondansetron (ZOFRAN) 8 MG tablet Take 1 tablet (8 mg total) by mouth every 8 (eight) hours as needed (Nausea or vomiting). 09/27/19   Lequita Asal, MD  Tocilizumab (ACTEMRA [place]) Inject into the skin.    [provider]    Allergies Mushroom extract complex, Nickel, and Shellfish allergy  Family History  Adopted: Yes    Social History Social History   Tobacco Use  . Smoking status: Former Smoker    Packs/day: 1.00    Years: 15.00    Pack years: 15.00    Types: Cigarettes    Quit date: 06/12/1985    Years since quitting: 34.3  . Smokeless tobacco: Never Used  Vaping Use  . Vaping Use: Never used  Substance Use Topics  . Alcohol use: Yes    Alcohol/week: 0.0 standard drinks    Comment: rarely  . Drug use: No    Review of Systems  Constitutional: No fever/chills Eyes: No visual changes. ENT: No sore throat. Cardiovascular: Denies chest pain. Respiratory: Denies shortness of breath. Gastrointestinal: Mild right upper quadrant abdominal pain.  No nausea, no vomiting.  No diarrhea.  No constipation. Genitourinary: Negative for dysuria. Musculoskeletal: Negative for back pain. Skin: Negative for rash. Neurological: Negative for headaches, focal weakness   ____________________________________________   PHYSICAL EXAM:  VITAL SIGNS: ED Triage Vitals  Enc Vitals Group     BP 09/28/19 1048 (!) 118/40     Pulse Rate 09/28/19 1048 65     Resp 09/28/19 1048 (!) 22     Temp 09/28/19 1048 98.7 F (37.1 C)     Temp Source 09/28/19 1048 Oral     SpO2 09/28/19 1048 96 %     Weight --      Height --       Head Circumference --      Peak Flow --      Pain Score 09/28/19 1049 0     Pain Loc --      Pain Edu? --      Excl. in [place]? --     Constitutional: Alert and oriented. Well appearing and in no acute distress. Eyes: Conjunctivae are normal.  Head: Atraumatic. Nose: No congestion/rhinnorhea. Mouth/Throat: Mucous membranes are moist.  Oropharynx non-erythematous. Neck: No stridor.   Cardiovascular: Normal rate, regular rhythm. Grossly normal heart sounds.  Good peripheral circulation. Respiratory: Normal respiratory  effort.  No retractions. Lungs CTAB. Gastrointestinal: Soft and nontender except mildly to palpation in the right upper quadrant. No distention. No abdominal bruits.  Musculoskeletal: No lower extremity tenderness nor edema.  No joint effusions. Neurologic:  Normal speech and language. No gross focal neurologic deficits are appreciated. No gait instability. Skin:  Skin is warm, dry and intact. No rash noted. Psychiatric: Mood and affect are normal. Speech and behavior are normal.  ____________________________________________   LABS (all labs ordered are listed, but only abnormal results are displayed)  Labs Reviewed  COMPREHENSIVE METABOLIC PANEL - Abnormal; Notable for the following components:      Result Value   Glucose, Bld 126 (*)    Creatinine, Ser 1.41 (*)    Total Protein 6.3 (*)    Albumin 3.1 (*)    AST 225 (*)    ALT 177 (*)    Alkaline Phosphatase 669 (*)    Total Bilirubin 2.3 (*)    GFR calc non Af Amer 36 (*)    GFR calc Af Amer 42 (*)    All other components within normal limits  LACTIC ACID, PLASMA - Abnormal; Notable for the following components:   Lactic Acid, Venous 2.3 (*)    All other components within normal limits  CBC WITH DIFFERENTIAL/PLATELET - Abnormal; Notable for the following components:   WBC 10.6 (*)    RDW 15.8 (*)    nRBC 0.7 (*)    Neutro Abs 9.0 (*)    Abs Immature Granulocytes 0.12 (*)    All other components within  normal limits  TROPONIN I (HIGH SENSITIVITY) - Abnormal; Notable for the following components:   Troponin I (High Sensitivity) 31 (*)    All other components within normal limits  SARS CORONAVIRUS 2 BY RT PCR (HOSPITAL ORDER, [place] [facility])  LACTIC ACID, PLASMA  URINALYSIS, COMPLETE (UACMP) WITH MICROSCOPIC  TROPONIN I (HIGH SENSITIVITY)   ____________________________________________  EKG  EKG read interpreted by me shows normal sinus rhythm rate of 67 normal axis no acute ST-T wave changes      ____________________________________________  RADIOLOGY  ED MD interpretation: CT read by radiology reviewed by me does show multiple liver masses and some new ascites.  Official radiology report(s): CT CHEST ABDOMEN PELVIS W CONTRAST  Result Date: 09/28/2019 CLINICAL DATA:  Weakness and hypotension.  Metastatic breast cancer. EXAM: CT CHEST, ABDOMEN, AND PELVIS WITH CONTRAST TECHNIQUE: Multidetector CT imaging of the chest, abdomen and pelvis was performed following the standard protocol during bolus administration of intravenous contrast. CONTRAST:  15mL OMNIPAQUE IOHEXOL 300 MG/ML  SOLN COMPARISON:  CT abdomen dated September 06, 2019. CT chest dated September 07, 2018. FINDINGS: CT CHEST FINDINGS Cardiovascular: No significant vascular findings. Normal heart size. No pericardial effusion. No thoracic aortic aneurysm. Coronary, aortic arch, and branch vessel atherosclerotic vascular disease. Mediastinum/Nodes: No enlarged mediastinal, hilar, or axillary lymph nodes. Prior left axillary lymph node dissection. Unchanged 7 mm hypodense nodule in the right thyroid lobe. Not clinically significant; no follow-up imaging recommended. Trachea and esophagus demonstrate no significant findings. Lungs/Pleura: No focal consolidation, pleural effusion, or pneumothorax. New 3 mm nodule in the peripheral left lower lobe (series 4, image 107). No additional pulmonary nodules. Musculoskeletal:  Innumerable patchy indistinct small sclerotic foci throughout the thoracic spine, sternum, and ribs, new since August 2020. Prior bilateral mastectomies. CT ABDOMEN PELVIS FINDINGS Hepatobiliary: Innumerable confluent hypoenhancing liver masses scattered throughout the liver are similar to recent CT. New trace perihepatic ascites. No gallstones  or gallbladder wall thickening. No biliary dilatation. Pancreas: Unremarkable. No pancreatic ductal dilatation or surrounding inflammatory changes. Spleen: Normal in size without focal abnormality. Adrenals/Urinary Tract: Unchanged 1.2 cm right adrenal nodule. The left adrenal gland is unremarkable. Unchanged 3.9 cm simple cyst in the right kidney. No renal calculi or hydronephrosis. Bladder is unremarkable. Stomach/Bowel: Stomach is within normal limits. Appendix appears normal. No evidence of bowel wall thickening, distention, or inflammatory changes. Moderate colonic diverticulosis. Vascular/Lymphatic: Aortic atherosclerosis. No enlarged abdominal or pelvic lymph nodes. Reproductive: Calcified uterine fibroids.  No adnexal mass. Other: Trace free fluid in the pelvis. No pneumoperitoneum. Small fat containing umbilical hernia. Musculoskeletal: Innumerable patchy indistinct small sclerotic foci scattered throughout the lumbar spine and pelvis are again noted. IMPRESSION: 1. No acute intrathoracic process. No acute intra-abdominal process. 2. Unchanged hepatic and osseous metastases. Unchanged suspected small right adrenal metastasis. 3. New 3 mm pulmonary nodule in the peripheral left lower lobe, indeterminate. Attention on follow-up. 4. New trace ascites. 5. Aortic Atherosclerosis (ICD10-I70.0). Electronically Signed   By: Titus Dubin M.D.   On: 09/28/2019 12:51    ____________________________________________   PROCEDURES  Procedure(s) performed (including Critical Care):  Procedures   ____________________________________________   INITIAL IMPRESSION /  ASSESSMENT AND PLAN / ED COURSE  Patient with near syncope and hypotension.  She has a little bit of new ascites as well.  She will need to be watched in the hospital to fully elucidate the cause of her near syncope and hypotension.  This could be anything from an infection to bleeding from the liver tumor.              ____________________________________________   FINAL CLINICAL IMPRESSION(S) / ED DIAGNOSES  Final diagnoses:  Near syncope  Hypotension, unspecified hypotension type     ED Discharge Orders    None      *Please note:  [facility] was evaluated in Emergency Department on 09/28/2019 for the symptoms described in the history of present illness. She was evaluated in the context of the global COVID-19 pandemic, which necessitated consideration that the patient might be at risk for infection with the SARS-CoV-2 virus that causes COVID-19. Institutional protocols and algorithms that pertain to the evaluation of patients at risk for COVID-19 are in a state of rapid change based on information released by regulatory bodies including the CDC and federal and state organizations. These policies and algorithms were followed during the patient's care in the ED.  Some ED evaluations and interventions may be delayed as a result of limited staffing during and the pandemic.*   Note:  This document was prepared using Dragon voice recognition software and may include unintentional dictation errors.    Nena Polio, MD 09/28/19 [phone]

## 2019-09-28 NOTE — ED Triage Notes (Signed)
Pt arrives via ems from [facility] urgent care. Pt had routine checkup today for a chest port that was to be placed this afternoon for treatment of liver cancer. Pt reports feeling weak over last week. Hypotensive at urgent care with BP of 72/50. Ems admin 600 NS pressure increased to 106/60. Pt a&o x 4, NAD noted at this time.  md a bedside to assess

## 2019-09-29 DIAGNOSIS — N179 Acute kidney failure, unspecified: Secondary | ICD-10-CM | POA: Diagnosis not present

## 2019-09-29 DIAGNOSIS — R7989 Other specified abnormal findings of blood chemistry: Secondary | ICD-10-CM | POA: Diagnosis not present

## 2019-09-29 DIAGNOSIS — C50812 Malignant neoplasm of overlapping sites of left female breast: Secondary | ICD-10-CM | POA: Diagnosis not present

## 2019-09-29 DIAGNOSIS — I959 Hypotension, unspecified: Secondary | ICD-10-CM | POA: Diagnosis not present

## 2019-09-29 LAB — BASIC METABOLIC PANEL
Anion gap: 11 (ref 5–15)
BUN: 21 mg/dL (ref 8–23)
CO2: 22 mmol/L (ref 22–32)
Calcium: 9.2 mg/dL (ref 8.9–10.3)
Chloride: 107 mmol/L (ref 98–111)
Creatinine, Ser: 0.97 mg/dL (ref 0.44–1.00)
GFR calc Af Amer: >60 mL/min (ref >60)
GFR calc non Af Amer: 57 mL/min — ABNORMAL LOW (ref >60)
Glucose, Bld: 122 mg/dL — ABNORMAL HIGH (ref 70–99)
Potassium: 3.9 mmol/L (ref 3.5–5.1)
Sodium: 140 mmol/L (ref 135–145)

## 2019-09-29 LAB — CBC
HCT: 34.2 % — ABNORMAL LOW (ref 36.0–46.0)
Hemoglobin: 11.4 g/dL — ABNORMAL LOW (ref 12.0–15.0)
MCH: 29.2 pg (ref 26.0–34.0)
MCHC: 33.3 g/dL (ref 30.0–36.0)
MCV: 87.7 fL (ref 80.0–100.0)
Platelets: 119 10*3/uL — ABNORMAL LOW (ref 150–400)
RBC: 3.9 MIL/uL (ref 3.87–5.11)
RDW: 15.6 % — ABNORMAL HIGH (ref 11.5–15.5)
WBC: 10 10*3/uL (ref 4.0–10.5)
nRBC: 0.3 % — ABNORMAL HIGH (ref 0.0–0.2)

## 2019-09-29 LAB — GLUCOSE, CAPILLARY: Glucose-Capillary: 100 mg/dL — ABNORMAL HIGH (ref 70–99)

## 2019-09-29 MED ORDER — LOSARTAN POTASSIUM 50 MG PO TABS: 25.0000 mg | ORAL_TABLET | Freq: Every day | ORAL | 0 refills | Status: DC

## 2019-09-29 MED ORDER — METOPROLOL TARTRATE 25 MG PO TABS
25.0000 mg | ORAL_TABLET | Freq: Two times a day (BID) | ORAL | Status: DC
  Administered 2019-09-29: 25 mg via ORAL
  Filled 2019-09-29: qty 1

## 2019-09-29 MED ORDER — PREDNISONE 20 MG PO TABS
20.0000 mg | ORAL_TABLET | Freq: Every day | ORAL | Status: DC
  Administered 2019-09-29: 20 mg via ORAL
  Filled 2019-09-29: qty 1

## 2019-09-29 MED ORDER — LOSARTAN POTASSIUM 25 MG PO TABS
25.0000 mg | ORAL_TABLET | Freq: Every day | ORAL | Status: DC
  Administered 2019-09-29: 25 mg via ORAL
  Filled 2019-09-29: qty 1

## 2019-09-29 MED ORDER — PREDNISONE 10 MG PO TABS
20.0000 mg | ORAL_TABLET | Freq: Every day | ORAL | 0 refills | Status: DC
Start: 2019-09-29 — End: 2019-10-10

## 2019-09-29 MED ORDER — ONDANSETRON HCL 8 MG PO TABS: 8.0000 mg | ORAL_TABLET | Freq: Three times a day (TID) | ORAL | 2 refills | Status: AC | PRN

## 2019-09-29 NOTE — Plan of Care (Signed)

## 2019-09-29 NOTE — Plan of Care (Signed)

## 2019-09-29 NOTE — Progress Notes (Signed)
OT Cancellation Note  Patient Details Name: TAKYLA KUCHERA MRN: [id number] DOB: 1945/12/08   Cancelled Treatment:    Reason Eval/Treat Not Completed: Upon arrival, pt. was with the nurse preparing for discharge. OT screened, no needs currently identified, will sign off  Harrel Carina, MS, OTR/L 09/29/2019, 11:16 AM

## 2019-09-29 NOTE — Progress Notes (Signed)
Patient ambulated around nurses station with no difficulty.

## 2019-09-29 NOTE — Discharge Instructions (Signed)
Pt to call Dr Kem Parkinson office and figure out her [place] placement

## 2019-09-29 NOTE — Discharge Summary (Addendum)
[facility] [place] NAME: Faith [name]    MR#:  [id number]  DATE OF BIRTH:  [birth year]  DATE OF ADMISSION:  09/28/2019 ADMITTING PHYSICIAN: Ivor Costa, MD  DATE OF DISCHARGE: 09/29/2019  PRIMARY CARE PHYSICIAN: Leonel Ramsay, MD    ADMISSION DIAGNOSIS:  Near syncope [R55] Abdominal pain [R10.9] Hypotension [I95.9] Hypotension, unspecified hypotension type [I95.9] Malignant neoplasm of areola of breast in female, unspecified estrogen receptor status, unspecified laterality ([place]) [C50.019]  DISCHARGE DIAGNOSIS:  Near Syncope due to Acute renal failure/Dehydration  SECONDARY DIAGNOSIS:   Past Medical History:  Diagnosis Date  . Arthritis    bilateral knees  . Cancer [facility]) April 2015   left breast  . Coronary artery disease   . Diabetes mellitus without complication ([place])   . Hyperlipidemia   . Hypertension   . Myocardial infarction ([place]) 11/03/2015   [facility], stent placed  . Pneumonia     HOSPITAL COURSE:   Faith [name] is a 74 y.o. female with medical history significant of hypertension, hyperlipidemia, diabetes mellitus, CAD, myocardial infarction, breast cancer (s/p of bilateral mastectomy, chemotherapy), bone and liver metastasized disease, CKD stage III, arthritis, who presents with generalized weakness and near syncope.  Hypotension: Etiology is not clear, possibly due to dehydration.  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 - Patient states that she is about to start chemotherapy on Monday for liver metastasized disease. -Follow-up with oncologist Dr Mike Gip  Elevated LFTs: Most likely due to liver metastasized disease -Avoid using Tylenol  Near  syncope: Likely due to hypotension -IV fluid as above -resovled  Hypertension -resume Cozaar (25 mg qd for now) and metoprolol due to hypotension  HLD (hyperlipidemia) -Lipitor  Acute renal failure superimposed on stage 3a chronic kidney disease ([place]): Baseline creatinine 1.0.  Her creatinine is 1.41, BUN 22.  Most likely due to dehydration -IV fluid as above -creat back to 0.96 -BP stable -pt takes Bactrim-DS 3 times/weekly --reason unsure. I tried to look up old records did not find it  Giant cell arteritis--po prednisone 20 mg qd with taper per pt according to her Rheumatologist  Elevated troponin and CAD: Troponin 31 --> 26.  Already trending down.  No chest pain.  Likely due to demand ischemia. -Continue aspirin, Lipitor   DVT ppx: SQ Lovenox Code Status: Full code Family Communication: patient Disposition Plan:  Anticipate discharge back to previous environment Consults called:  none Admission status: Med-surg bed for obs   Status is: Observation  The patient remains OBS appropriate and will d/c before 2 midnights.  Dispo: The patient is from: Home  Anticipated d/c is to: Home  Anticipated d/c date is: today  Patient currently is  medically stable to d/c.  ambulate pt prior to d/c   CONSULTS OBTAINED:    DRUG ALLERGIES:   Allergies  Allergen Reactions  . Mushroom Extract Complex Hives and Swelling  . Nickel Rash  . Shellfish Allergy Nausea And Vomiting    DISCHARGE MEDICATIONS:   Allergies as of 09/29/2019      Reactions   Mushroom Extract Complex Hives, Swelling   Nickel Rash   Shellfish Allergy Nausea And Vomiting      Medication List  STOP taking these medications   ACTEMRA [place]     TAKE these medications   alendronate 70 MG tablet Commonly known as: FOSAMAX 70 mg once a week.   aspirin EC 81 MG tablet Take 81 mg by mouth daily.   atorvastatin 80 MG tablet Commonly known as:  LIPITOR Take 80 mg by mouth daily at 6 PM.   Biotin 5 MG Caps Take 5 mg by mouth 2 (two) times a day.   Cholecalciferol 125 MCG (5000 UT) Tabs Take 1 capsule by mouth 2 (two) times daily.   CoQ10 100 MG Caps Take 100 mg by mouth daily.   losartan 50 MG tablet Commonly known as: COZAAR Take 0.5 tablets (25 mg total) by mouth daily. Increase to 1 tab (50 mg ) in 4-5 days if BP >017 systolic What changed:   how much to take  additional instructions   metoprolol tartrate 25 MG tablet Commonly known as: LOPRESSOR Take 25 mg by mouth 2 (two) times daily.   ondansetron 8 MG tablet Commonly known as: Zofran Take 1 tablet (8 mg total) by mouth every 8 (eight) hours as needed (Nausea or vomiting).   predniSONE 10 MG tablet Commonly known as: DELTASONE Take 2 tablets (20 mg total) by mouth daily. What changed: how much to take   sulfamethoxazole-trimethoprim 800-160 MG tablet Commonly known as: BACTRIM DS Take 1 tablet by mouth 3 (three) times a week.   vitamin C 1000 MG tablet Take 1,000 mg by mouth daily.       If you experience worsening of your admission symptoms, develop shortness of breath, life threatening emergency, suicidal or homicidal thoughts you must seek medical attention immediately by calling 911 or calling your MD immediately  if symptoms less severe.  You Must read complete instructions/literature along with all the possible adverse reactions/side effects for all the Medicines you take and that have been prescribed to you. Take any new Medicines after you have completely understood and accept all the possible adverse reactions/side effects.   Please note  You were cared for by a hospitalist during your hospital stay. If you have any questions about your discharge medications or the care you received while you were in the hospital after you are discharged, you can call the unit and asked to speak with the hospitalist on call if the hospitalist that took care of  you is not available. Once you are discharged, your primary care physician will handle any further medical issues. Please note that NO REFILLS for any discharge medications will be authorized once you are discharged, as it is imperative that you return to your primary care physician (or establish a relationship with a primary care physician if you do not have one) for your aftercare needs so that they can reassess your need for medications and monitor your lab values. Today   SUBJECTIVE  Feels better   VITAL SIGNS:  Blood pressure (!) 138/59, pulse 75, temperature 97.8 F (36.6 C), temperature source Oral, resp. rate 17, height 5\' 7"  (1.702 m), weight 88.9 kg, SpO2 96 %.  I/O:    Intake/Output Summary (Last 24 hours) at 09/29/2019 1120 Last data filed at 09/29/2019 0743 Gross per 24 hour  Intake 1137.78 ml  Output --  Net 1137.78 ml    PHYSICAL EXAMINATION:  GENERAL:  74 y.o.-year-old patient lying in the bed with no acute distress. Obese  HEENT: Head atraumatic, normocephalic. Oropharynx and nasopharynx clear.  LUNGS: Normal breath sounds bilaterally, no wheezing, rales,rhonchi or  crepitation. No use of accessory muscles of respiration. S/p mastectomy CARDIOVASCULAR: S1, S2 normal. No murmurs, rubs, or gallops.  ABDOMEN: Soft, non-tender, non-distended. Bowel sounds present. No organomegaly or mass.  EXTREMITIES: No pedal edema, cyanosis, or clubbing. Left UE lymphedema NEUROLOGIC: Cranial nerves II through XII are intact. Muscle strength 5/5 in all extremities. Sensation intact. Gait not checked.  PSYCHIATRIC: The patient is alert and oriented x 3.  SKIN: No obvious rash, lesion, or ulcer.   DATA REVIEW:   CBC  Recent Labs  Lab 09/29/19 0451  WBC 10.0  HGB 11.4*  HCT 34.2*  PLT 119*    Chemistries  Recent Labs  Lab 09/28/19 1051 09/28/19 1051 09/29/19 0451  NA 141   < > 140  K 4.1   < > 3.9  CL 104   < > 107  CO2 26   < > 22  GLUCOSE 126*   < > 122*  BUN 22   <  > 21  CREATININE 1.41*   < > 0.97  CALCIUM 9.9   < > 9.2  AST 225*  --   --   ALT 177*  --   --   ALKPHOS 669*  --   --   BILITOT 2.3*  --   --    < > = values in this interval not displayed.    Microbiology Results   Recent Results (from the past 240 hour(s))  SARS CORONAVIRUS 2 (TAT 6-24 HRS) Nasopharyngeal Nasopharyngeal Swab     Status: None   Collection Time: 09/26/19 11:21 AM   Specimen: Nasopharyngeal Swab  Result Value Ref Range Status   SARS Coronavirus 2 NEGATIVE NEGATIVE Final    Comment: (NOTE) SARS-CoV-2 target nucleic acids are NOT DETECTED.  The SARS-CoV-2 RNA is generally detectable in upper and lower respiratory specimens during the acute phase of infection. Negative results do not preclude SARS-CoV-2 infection, do not rule out co-infections with other pathogens, and should not be used as the sole basis for treatment or other patient management decisions. Negative results must be combined with clinical observations, patient history, and epidemiological information. The expected result is Negative.  Fact Sheet for Patients: SugarRoll.be  Fact Sheet for Healthcare Providers: https://www.woods-mathews.com/  This test is not yet approved or cleared by the [place] FDA and  has been authorized for detection and/or diagnosis of SARS-CoV-2 by FDA under an Emergency Use Authorization (EUA). This EUA will remain  in effect (meaning this test can be used) for the duration of the COVID-19 declaration under Se ction 564(b)(1) of the Act, 21 U.S.C. section 360bbb-3(b)(1), unless the authorization is terminated or revoked sooner.  Performed at [facility], [place] [address] [place], [place] [address]   SARS Coronavirus 2 by RT PCR (hospital order, performed in [facility] hospital lab) Nasopharyngeal Nasopharyngeal Swab     Status: None   Collection Time: 09/28/19 10:51 AM   Specimen: Nasopharyngeal Swab  Result  Value Ref Range Status   SARS Coronavirus 2 NEGATIVE NEGATIVE Final    Comment: (NOTE) SARS-CoV-2 target nucleic acids are NOT DETECTED.  The SARS-CoV-2 RNA is generally detectable in upper and lower respiratory specimens during the acute phase of infection. The lowest concentration of SARS-CoV-2 viral copies this assay can detect is 250 copies / mL. A negative result does not preclude SARS-CoV-2 infection and should not be used as the sole basis for treatment or other patient management decisions.  A negative result may occur with improper specimen collection /  handling, submission of specimen other than nasopharyngeal swab, presence of viral mutation(s) within the areas targeted by this assay, and inadequate number of viral copies (<250 copies / mL). A negative result must be combined with clinical observations, patient history, and epidemiological information.  Fact Sheet for Patients:   StrictlyIdeas.no  Fact Sheet for Healthcare Providers: BankingDealers.co.za  This test is not yet approved or  cleared by the [place] FDA and has been authorized for detection and/or diagnosis of SARS-CoV-2 by FDA under an Emergency Use Authorization (EUA).  This EUA will remain in effect (meaning this test can be used) for the duration of the COVID-19 declaration under Section 564(b)(1) of the Act, 21 U.S.C. section 360bbb-3(b)(1), unless the authorization is terminated or revoked sooner.  Performed at [facility], [address] [place], [place] [address]     RADIOLOGY:  CT CHEST ABDOMEN PELVIS W CONTRAST  Result Date: 09/28/2019 CLINICAL DATA:  Weakness and hypotension.  Metastatic breast cancer. EXAM: CT CHEST, ABDOMEN, AND PELVIS WITH CONTRAST TECHNIQUE: Multidetector CT imaging of the chest, abdomen and pelvis was performed following the standard protocol during bolus administration of intravenous contrast. CONTRAST:  16mL  OMNIPAQUE IOHEXOL 300 MG/ML  SOLN COMPARISON:  CT abdomen dated September 06, 2019. CT chest dated September 07, 2018. FINDINGS: CT CHEST FINDINGS Cardiovascular: No significant vascular findings. Normal heart size. No pericardial effusion. No thoracic aortic aneurysm. Coronary, aortic arch, and branch vessel atherosclerotic vascular disease. Mediastinum/Nodes: No enlarged mediastinal, hilar, or axillary lymph nodes. Prior left axillary lymph node dissection. Unchanged 7 mm hypodense nodule in the right thyroid lobe. Not clinically significant; no follow-up imaging recommended. Trachea and esophagus demonstrate no significant findings. Lungs/Pleura: No focal consolidation, pleural effusion, or pneumothorax. New 3 mm nodule in the peripheral left lower lobe (series 4, image 107). No additional pulmonary nodules. Musculoskeletal: Innumerable patchy indistinct small sclerotic foci throughout the thoracic spine, sternum, and ribs, new since August 2020. Prior bilateral mastectomies. CT ABDOMEN PELVIS FINDINGS Hepatobiliary: Innumerable confluent hypoenhancing liver masses scattered throughout the liver are similar to recent CT. New trace perihepatic ascites. No gallstones or gallbladder wall thickening. No biliary dilatation. Pancreas: Unremarkable. No pancreatic ductal dilatation or surrounding inflammatory changes. Spleen: Normal in size without focal abnormality. Adrenals/Urinary Tract: Unchanged 1.2 cm right adrenal nodule. The left adrenal gland is unremarkable. Unchanged 3.9 cm simple cyst in the right kidney. No renal calculi or hydronephrosis. Bladder is unremarkable. Stomach/Bowel: Stomach is within normal limits. Appendix appears normal. No evidence of bowel wall thickening, distention, or inflammatory changes. Moderate colonic diverticulosis. Vascular/Lymphatic: Aortic atherosclerosis. No enlarged abdominal or pelvic lymph nodes. Reproductive: Calcified uterine fibroids.  No adnexal mass. Other: Trace free fluid in  the pelvis. No pneumoperitoneum. Small fat containing umbilical hernia. Musculoskeletal: Innumerable patchy indistinct small sclerotic foci scattered throughout the lumbar spine and pelvis are again noted. IMPRESSION: 1. No acute intrathoracic process. No acute intra-abdominal process. 2. Unchanged hepatic and osseous metastases. Unchanged suspected small right adrenal metastasis. 3. New 3 mm pulmonary nodule in the peripheral left lower lobe, indeterminate. Attention on follow-up. 4. New trace ascites. 5. Aortic Atherosclerosis (ICD10-I70.0). Electronically Signed   By: Titus Dubin M.D.   On: 09/28/2019 12:51     CODE STATUS:     Code Status Orders  (From admission, onward)         Start     Ordered   09/28/19 1643  Full code  Continuous        09/28/19 1643  Code Status History    Date Active Date Inactive Code Status Order ID Comments User Context   08/01/2018 1110 08/04/2018 1444 Full Code [id number]  Hessie Knows, MD Inpatient   03/28/2018 1229 03/30/2018 1933 Full Code [id number]  Hessie Knows, MD Inpatient   Advance Care Planning Activity       TOTAL TIME TAKING CARE OF THIS PATIENT: *35* minutes.    Fritzi Mandes M.D  [facility]    CC: Primary care physician; Leonel Ramsay, MD

## 2019-09-30 ENCOUNTER — Inpatient Hospital Stay
Admission: EM | Admit: 2019-09-30 | Discharge: 2019-10-10 | DRG: 674 | Disposition: A | Discharge status: Hospice | Payer: Medicare Other | Source: Ambulatory Visit | Attending: Internal Medicine | Admitting: Internal Medicine

## 2019-09-30 ENCOUNTER — Other Ambulatory Visit: Payer: Self-pay

## 2019-09-30 ENCOUNTER — Emergency Department: Payer: Medicare Other

## 2019-09-30 DIAGNOSIS — C787 Secondary malignant neoplasm of liver and intrahepatic bile duct: Secondary | ICD-10-CM | POA: Diagnosis not present

## 2019-09-30 DIAGNOSIS — D72829 Elevated white blood cell count, unspecified: Secondary | ICD-10-CM | POA: Diagnosis present

## 2019-09-30 DIAGNOSIS — R531 Weakness: Secondary | ICD-10-CM

## 2019-09-30 DIAGNOSIS — E861 Hypovolemia: Secondary | ICD-10-CM

## 2019-09-30 DIAGNOSIS — Z7982 Long term (current) use of aspirin: Secondary | ICD-10-CM | POA: Diagnosis not present

## 2019-09-30 DIAGNOSIS — E860 Dehydration: Secondary | ICD-10-CM | POA: Diagnosis present

## 2019-09-30 DIAGNOSIS — I252 Old myocardial infarction: Secondary | ICD-10-CM

## 2019-09-30 DIAGNOSIS — Z96653 Presence of artificial knee joint, bilateral: Secondary | ICD-10-CM | POA: Diagnosis present

## 2019-09-30 DIAGNOSIS — E1122 Type 2 diabetes mellitus with diabetic chronic kidney disease: Secondary | ICD-10-CM | POA: Diagnosis present

## 2019-09-30 DIAGNOSIS — Z7952 Long term (current) use of systemic steroids: Secondary | ICD-10-CM | POA: Diagnosis not present

## 2019-09-30 DIAGNOSIS — K8020 Calculus of gallbladder without cholecystitis without obstruction: Secondary | ICD-10-CM | POA: Diagnosis present

## 2019-09-30 DIAGNOSIS — T68XXXA Hypothermia, initial encounter: Secondary | ICD-10-CM | POA: Diagnosis not present

## 2019-09-30 DIAGNOSIS — I10 Essential (primary) hypertension: Secondary | ICD-10-CM | POA: Diagnosis not present

## 2019-09-30 DIAGNOSIS — D696 Thrombocytopenia, unspecified: Secondary | ICD-10-CM | POA: Diagnosis not present

## 2019-09-30 DIAGNOSIS — I2510 Atherosclerotic heart disease of native coronary artery without angina pectoris: Secondary | ICD-10-CM | POA: Diagnosis not present

## 2019-09-30 DIAGNOSIS — Z7983 Long term (current) use of bisphosphonates: Secondary | ICD-10-CM

## 2019-09-30 DIAGNOSIS — M316 Other giant cell arteritis: Secondary | ICD-10-CM | POA: Diagnosis present

## 2019-09-30 DIAGNOSIS — Z6830 Body mass index (BMI) 30.0-30.9, adult: Secondary | ICD-10-CM

## 2019-09-30 DIAGNOSIS — T380X5A Adverse effect of glucocorticoids and synthetic analogues, initial encounter: Secondary | ICD-10-CM | POA: Diagnosis not present

## 2019-09-30 DIAGNOSIS — I959 Hypotension, unspecified: Secondary | ICD-10-CM | POA: Diagnosis present

## 2019-09-30 DIAGNOSIS — R109 Unspecified abdominal pain: Secondary | ICD-10-CM

## 2019-09-30 DIAGNOSIS — Z66 Do not resuscitate: Secondary | ICD-10-CM | POA: Diagnosis not present

## 2019-09-30 DIAGNOSIS — Z853 Personal history of malignant neoplasm of breast: Secondary | ICD-10-CM

## 2019-09-30 DIAGNOSIS — R188 Other ascites: Secondary | ICD-10-CM | POA: Diagnosis present

## 2019-09-30 DIAGNOSIS — I129 Hypertensive chronic kidney disease with stage 1 through stage 4 chronic kidney disease, or unspecified chronic kidney disease: Secondary | ICD-10-CM | POA: Diagnosis present

## 2019-09-30 DIAGNOSIS — Z955 Presence of coronary angioplasty implant and graft: Secondary | ICD-10-CM

## 2019-09-30 DIAGNOSIS — Z9221 Personal history of antineoplastic chemotherapy: Secondary | ICD-10-CM

## 2019-09-30 DIAGNOSIS — E669 Obesity, unspecified: Secondary | ICD-10-CM | POA: Diagnosis present

## 2019-09-30 DIAGNOSIS — A419 Sepsis, unspecified organism: Secondary | ICD-10-CM | POA: Diagnosis not present

## 2019-09-30 DIAGNOSIS — N189 Chronic kidney disease, unspecified: Secondary | ICD-10-CM

## 2019-09-30 DIAGNOSIS — C50919 Malignant neoplasm of unspecified site of unspecified female breast: Secondary | ICD-10-CM | POA: Diagnosis not present

## 2019-09-30 DIAGNOSIS — R6510 Systemic inflammatory response syndrome (SIRS) of non-infectious origin without acute organ dysfunction: Secondary | ICD-10-CM

## 2019-09-30 DIAGNOSIS — R7989 Other specified abnormal findings of blood chemistry: Secondary | ICD-10-CM | POA: Diagnosis not present

## 2019-09-30 DIAGNOSIS — R680 Hypothermia, not associated with low environmental temperature: Secondary | ICD-10-CM | POA: Diagnosis present

## 2019-09-30 DIAGNOSIS — I9589 Other hypotension: Secondary | ICD-10-CM

## 2019-09-30 DIAGNOSIS — C799 Secondary malignant neoplasm of unspecified site: Secondary | ICD-10-CM

## 2019-09-30 DIAGNOSIS — C50812 Malignant neoplasm of overlapping sites of left female breast: Secondary | ICD-10-CM

## 2019-09-30 DIAGNOSIS — Z79899 Other long term (current) drug therapy: Secondary | ICD-10-CM

## 2019-09-30 DIAGNOSIS — Z515 Encounter for palliative care: Secondary | ICD-10-CM | POA: Diagnosis not present

## 2019-09-30 DIAGNOSIS — R54 Age-related physical debility: Secondary | ICD-10-CM | POA: Diagnosis present

## 2019-09-30 DIAGNOSIS — E785 Hyperlipidemia, unspecified: Secondary | ICD-10-CM | POA: Diagnosis present

## 2019-09-30 DIAGNOSIS — N179 Acute kidney failure, unspecified: Secondary | ICD-10-CM | POA: Diagnosis not present

## 2019-09-30 DIAGNOSIS — Z87891 Personal history of nicotine dependence: Secondary | ICD-10-CM

## 2019-09-30 DIAGNOSIS — N1831 Chronic kidney disease, stage 3a: Secondary | ICD-10-CM | POA: Diagnosis not present

## 2019-09-30 DIAGNOSIS — C7951 Secondary malignant neoplasm of bone: Secondary | ICD-10-CM | POA: Diagnosis not present

## 2019-09-30 DIAGNOSIS — Z9013 Acquired absence of bilateral breasts and nipples: Secondary | ICD-10-CM

## 2019-09-30 DIAGNOSIS — R6521 Severe sepsis with septic shock: Secondary | ICD-10-CM

## 2019-09-30 DIAGNOSIS — Z170 Estrogen receptor positive status [ER+]: Secondary | ICD-10-CM

## 2019-09-30 DIAGNOSIS — Z20822 Contact with and (suspected) exposure to covid-19: Secondary | ICD-10-CM | POA: Diagnosis present

## 2019-09-30 DIAGNOSIS — K8081 Other cholelithiasis with obstruction: Secondary | ICD-10-CM

## 2019-09-30 DIAGNOSIS — R7401 Elevation of levels of liver transaminase levels: Secondary | ICD-10-CM | POA: Diagnosis present

## 2019-09-30 DIAGNOSIS — R778 Other specified abnormalities of plasma proteins: Secondary | ICD-10-CM

## 2019-09-30 LAB — CBC WITH DIFFERENTIAL/PLATELET
Abs Immature Granulocytes: 0.16 10*3/uL — ABNORMAL HIGH (ref 0.00–0.07)
Basophils Absolute: 0 10*3/uL (ref 0.0–0.1)
Basophils Relative: 0 %
Eosinophils Absolute: 0.3 10*3/uL (ref 0.0–0.5)
Eosinophils Relative: 2 %
HCT: 42 % (ref 36.0–46.0)
Hemoglobin: 14 g/dL (ref 12.0–15.0)
Immature Granulocytes: 1 %
Lymphocytes Relative: 2 %
Lymphs Abs: 0.2 10*3/uL — ABNORMAL LOW (ref 0.7–4.0)
MCH: 29 pg (ref 26.0–34.0)
MCHC: 33.3 g/dL (ref 30.0–36.0)
MCV: 87.1 fL (ref 80.0–100.0)
Monocytes Absolute: 0.1 10*3/uL (ref 0.1–1.0)
Monocytes Relative: 1 %
Neutro Abs: 12.5 10*3/uL — ABNORMAL HIGH (ref 1.7–7.7)
Neutrophils Relative %: 94 %
Platelets: 135 10*3/uL — ABNORMAL LOW (ref 150–400)
RBC: 4.82 MIL/uL (ref 3.87–5.11)
RDW: 15.8 % — ABNORMAL HIGH (ref 11.5–15.5)
WBC: 13.2 10*3/uL — ABNORMAL HIGH (ref 4.0–10.5)
nRBC: 0.5 % — ABNORMAL HIGH (ref 0.0–0.2)

## 2019-09-30 LAB — COMPREHENSIVE METABOLIC PANEL
ALT: 218 U/L — ABNORMAL HIGH (ref 0–44)
AST: 358 U/L — ABNORMAL HIGH (ref 15–41)
Albumin: 3 g/dL — ABNORMAL LOW (ref 3.5–5.0)
Alkaline Phosphatase: 900 U/L — ABNORMAL HIGH (ref 38–126)
Anion gap: 16 — ABNORMAL HIGH (ref 5–15)
BUN: 17 mg/dL (ref 8–23)
CO2: 21 mmol/L — ABNORMAL LOW (ref 22–32)
Calcium: 10.2 mg/dL (ref 8.9–10.3)
Chloride: 104 mmol/L (ref 98–111)
Creatinine, Ser: 1.54 mg/dL — ABNORMAL HIGH (ref 0.44–1.00)
GFR calc Af Amer: 38 mL/min — ABNORMAL LOW (ref >60)
GFR calc non Af Amer: 33 mL/min — ABNORMAL LOW (ref >60)
Glucose, Bld: 113 mg/dL — ABNORMAL HIGH (ref 70–99)
Potassium: 3.8 mmol/L (ref 3.5–5.1)
Sodium: 141 mmol/L (ref 135–145)
Total Bilirubin: 2.2 mg/dL — ABNORMAL HIGH (ref 0.3–1.2)
Total Protein: 6.5 g/dL (ref 6.5–8.1)

## 2019-09-30 LAB — LACTIC ACID, PLASMA
Lactic Acid, Venous: 5.9 mmol/L (ref 0.5–1.9)
Lactic Acid, Venous: 4.4 mmol/L (ref 0.5–1.9)

## 2019-09-30 LAB — ETHANOL: Alcohol, Ethyl (B): <10 mg/dL (ref <10)

## 2019-09-30 LAB — T4, FREE: Free T4: 1.37 ng/dL — ABNORMAL HIGH (ref 0.61–1.12)

## 2019-09-30 LAB — LIPASE, BLOOD: Lipase: 73 U/L — ABNORMAL HIGH (ref 11–51)

## 2019-09-30 LAB — SALICYLATE LEVEL: Salicylate Lvl: <7 mg/dL — ABNORMAL LOW (ref 7.0–30.0)

## 2019-09-30 LAB — TSH: TSH: 5.203 u[IU]/mL — ABNORMAL HIGH (ref 0.350–4.500)

## 2019-09-30 LAB — TROPONIN I (HIGH SENSITIVITY)
Troponin I (High Sensitivity): 160 ng/L (ref <18)
Troponin I (High Sensitivity): 111 ng/L (ref <18)

## 2019-09-30 LAB — MAGNESIUM: Magnesium: 1.9 mg/dL (ref 1.7–2.4)

## 2019-09-30 LAB — ACETAMINOPHEN LEVEL: Acetaminophen (Tylenol), Serum: 10 ug/mL (ref 10–30)

## 2019-09-30 LAB — PHOSPHORUS: Phosphorus: 2.3 mg/dL — ABNORMAL LOW (ref 2.5–4.6)

## 2019-09-30 LAB — FIBRIN DERIVATIVES D-DIMER (ARMC ONLY): Fibrin derivatives D-dimer (ARMC): >7500 ng/mL (FEU) — ABNORMAL HIGH (ref 0.00–499.00)

## 2019-09-30 MED ORDER — ACETAMINOPHEN 650 MG RE SUPP: 650.0000 mg | Freq: Four times a day (QID) | RECTAL | Status: DC | PRN

## 2019-09-30 MED ORDER — ONDANSETRON HCL 4 MG/2ML IJ SOLN
INTRAMUSCULAR | Status: AC
  Administered 2019-09-30: 4 mg via INTRAVENOUS
  Filled 2019-09-30: qty 2

## 2019-09-30 MED ORDER — ACETAMINOPHEN 325 MG PO TABS
650.0000 mg | ORAL_TABLET | Freq: Four times a day (QID) | ORAL | Status: DC | PRN
  Administered 2019-10-01 – 2019-10-04 (×4): 650 mg via ORAL
  Filled 2019-09-30 (×4): qty 2

## 2019-09-30 MED ORDER — COQ10 100 MG PO CAPS: 100.0000 mg | ORAL_CAPSULE | Freq: Every day | ORAL | Status: DC

## 2019-09-30 MED ORDER — SODIUM CHLORIDE 0.9 % IV SOLN
2.0000 g | Freq: Two times a day (BID) | INTRAVENOUS | Status: DC
  Administered 2019-10-01 – 2019-10-04 (×7): 2 g via INTRAVENOUS
  Filled 2019-09-30 (×9): qty 2

## 2019-09-30 MED ORDER — ONDANSETRON HCL 4 MG PO TABS: 8.0000 mg | ORAL_TABLET | Freq: Three times a day (TID) | ORAL | Status: DC | PRN

## 2019-09-30 MED ORDER — ONDANSETRON HCL 4 MG/2ML IJ SOLN
4.0000 mg | Freq: Four times a day (QID) | INTRAMUSCULAR | Status: DC | PRN
  Administered 2019-10-05: 4 mg via INTRAVENOUS
  Filled 2019-09-30 (×2): qty 2

## 2019-09-30 MED ORDER — METRONIDAZOLE IN NACL 5-0.79 MG/ML-% IV SOLN
500.0000 mg | Freq: Three times a day (TID) | INTRAVENOUS | Status: DC
  Administered 2019-10-01 – 2019-10-04 (×10): 500 mg via INTRAVENOUS
  Filled 2019-09-30 (×13): qty 100

## 2019-09-30 MED ORDER — VANCOMYCIN HCL 1750 MG/350ML IV SOLN
1750.0000 mg | Freq: Once | INTRAVENOUS | Status: AC
  Administered 2019-10-01: 1750 mg via INTRAVENOUS
  Filled 2019-09-30: qty 350

## 2019-09-30 MED ORDER — ONDANSETRON HCL 4 MG/2ML IJ SOLN: 4.0000 mg | Freq: Once | INTRAMUSCULAR | Status: AC

## 2019-09-30 MED ORDER — SODIUM CHLORIDE 0.9 % IV BOLUS (SEPSIS)
2000.0000 mL | Freq: Once | INTRAVENOUS | Status: AC
  Administered 2019-09-30: 2000 mL via INTRAVENOUS

## 2019-09-30 MED ORDER — MAGNESIUM HYDROXIDE 400 MG/5ML PO SUSP: 30.0000 mL | Freq: Every day | ORAL | Status: DC | PRN

## 2019-09-30 MED ORDER — TRAZODONE HCL 50 MG PO TABS
25.0000 mg | ORAL_TABLET | Freq: Every evening | ORAL | Status: DC | PRN
  Administered 2019-10-01: 25 mg via ORAL
  Filled 2019-09-30: qty 1

## 2019-09-30 MED ORDER — ENOXAPARIN SODIUM 40 MG/0.4ML ~~LOC~~ SOLN
40.0000 mg | SUBCUTANEOUS | Status: DC
  Administered 2019-10-02 – 2019-10-08 (×6): 40 mg via SUBCUTANEOUS
  Filled 2019-09-30 (×8): qty 0.4

## 2019-09-30 MED ORDER — IOHEXOL 350 MG/ML SOLN
75.0000 mL | Freq: Once | INTRAVENOUS | Status: AC | PRN
  Administered 2019-09-30: 75 mL via INTRAVENOUS

## 2019-09-30 MED ORDER — SODIUM CHLORIDE 0.9 % IV SOLN
2.0000 g | Freq: Once | INTRAVENOUS | Status: AC
  Administered 2019-09-30: 2 g via INTRAVENOUS
  Filled 2019-09-30: qty 2

## 2019-09-30 MED ORDER — ASCORBIC ACID 500 MG PO TABS
1000.0000 mg | ORAL_TABLET | Freq: Every day | ORAL | Status: DC
  Administered 2019-10-02 – 2019-10-07 (×4): 1000 mg via ORAL
  Filled 2019-09-30 (×8): qty 2

## 2019-09-30 MED ORDER — METRONIDAZOLE IN NACL 5-0.79 MG/ML-% IV SOLN
500.0000 mg | Freq: Once | INTRAVENOUS | Status: AC
  Administered 2019-09-30: 500 mg via INTRAVENOUS
  Filled 2019-09-30: qty 100

## 2019-09-30 MED ORDER — SODIUM CHLORIDE 0.9 % IV BOLUS
1000.0000 mL | Freq: Once | INTRAVENOUS | Status: AC
  Administered 2019-09-30: 1000 mL via INTRAVENOUS

## 2019-09-30 MED ORDER — ONDANSETRON HCL 4 MG PO TABS: 4.0000 mg | ORAL_TABLET | Freq: Four times a day (QID) | ORAL | Status: DC | PRN

## 2019-09-30 MED ORDER — SODIUM CHLORIDE 0.9 % IV SOLN: INTRAVENOUS | Status: DC

## 2019-09-30 MED ORDER — VITAMIN D 25 MCG (1000 UNIT) PO TABS
5000.0000 [IU] | ORAL_TABLET | Freq: Two times a day (BID) | ORAL | Status: DC
  Administered 2019-10-01 – 2019-10-09 (×12): 5000 [IU] via ORAL
  Filled 2019-09-30 (×16): qty 5

## 2019-09-30 NOTE — ED Notes (Signed)
Pt back from CT, bair hugger and additional warm blankets given

## 2019-09-30 NOTE — ED Triage Notes (Signed)
Patient c/o weakness, abdominal pain, and back pain. Patient reports being seen in this ED 2 days ago for hypotension.

## 2019-09-30 NOTE — ED Triage Notes (Signed)
First RN Note: pt currently being treated for liver cancer, pt c/o generalized weakness and general malaise, pt seen 9/10 for same, states symptoms becoming increasingly worse.

## 2019-09-30 NOTE — ED Notes (Signed)
Lab called for trop and lactic status - Yenni reports labs running

## 2019-09-30 NOTE — Progress Notes (Signed)
PHARMACY -  BRIEF ANTIBIOTIC NOTE   Pharmacy has received consult(s) for Cefepime from an ED provider.  The patient's profile has been reviewed for ht/wt/allergies/indication/available labs.    One time order(s) placed for Cefepime 2g by ED provider  Further antibiotics/pharmacy consults should be ordered by admitting physician if indicated.                       Thank you, Vira Blanco 09/30/2019  9:46 PM

## 2019-09-30 NOTE — ED Notes (Signed)
Pt to CT

## 2019-09-30 NOTE — ED Notes (Addendum)
Fluid bolus moved to warm NS bolus per convo with EDP - no bair hugger in hallway only unit is in room 11 - tele request placed for another, warm blankets applied front and back of pt, initiated and stopped b./c pt to [place] att  No scanner for hallway pt available

## 2019-09-30 NOTE — ED Provider Notes (Signed)
[facility] Emergency Department Provider Note  ____________________________________________  Time seen: Approximately 9:43 PM  I have reviewed the triage vital signs and the nursing notes.   HISTORY  Chief Complaint Weakness and Abdominal Pain    HPI Faith [name] is a 74 y.o. female with a history of breast cancer metastatic to liver, hypertension diabetes CAD  who comes the ED complaining of severe generalized weakness, lightheaded with standing and fatigue, this is been going on for the past for 5 days, gradual onset and worsening and now severe.  She was seen in the ED 2 days ago for the same, had extensive work-up including labs and CT chest abdomen pelvis which was all reassuring.  Lactate at that time was about 2, troponin was about 20.  She was given IV fluids felt better and was discharged home.  Since going home, she denies any new acute pain or other symptoms but just recurrence and progression of the previous symptoms.     Past Medical History:  Diagnosis Date  . Arthritis    bilateral knees  . Cancer [facility] April 2015   left breast  . Coronary artery disease   . Diabetes mellitus without complication ([place])   . Hyperlipidemia   . Hypertension   . Myocardial infarction ([place]) 11/03/2015   [facility], stent placed  . Pneumonia      Patient Active Problem List   Diagnosis Date Noted  . Elevated bilirubin 09/28/2019  . Hypotension 09/28/2019  . Near syncope 09/28/2019  . Hypertension 09/28/2019  . HLD (hyperlipidemia) 09/28/2019  . Acute renal failure superimposed on stage 3a chronic kidney disease ([place]) 09/28/2019  . Elevated troponin 09/28/2019  . CAD (coronary artery disease) 09/28/2019  . Liver metastasis ([place]) 09/25/2019  . Bone metastasis ([place]) 09/09/2019  . Elevated LFTs 09/06/2019  . Elevated ferritin 09/06/2019  . Liver masses 09/06/2019  . Weight loss 10/05/2018  . Osteopenia of lumbar spine 09/28/2018  . Status post total knee  replacement using cement, right 08/01/2018  . Goals of care, counseling/discussion 06/30/2018  . Status post total knee replacement using cement, left 03/28/2018  . 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 female, estrogen receptor positive ([place]) 09/09/2015     Past Surgical History:  Procedure Laterality Date  . BREAST SURGERY Bilateral 11/19/13   bilater mastectomy   . CARDIAC CATHETERIZATION    . CORONARY ANGIOPLASTY    . PORT-A-CATH REMOVAL  2016  . PORTACATH PLACEMENT  06-07-13  . SHOULDER OPEN ROTATOR CUFF REPAIR  2012  . TOTAL KNEE ARTHROPLASTY Left 03/28/2018   Procedure: TOTAL KNEE ARTHROPLASTY-LEFT;  Surgeon: Hessie Knows, MD;  Location: [facility] ORS;  Service: Orthopedics;  Laterality: Left;  . TOTAL KNEE ARTHROPLASTY Right 08/01/2018   Procedure: RIGHT TOTAL KNEE ARTHROPLASTY;  Surgeon: Hessie Knows, MD;  Location: [facility] ORS;  Service: Orthopedics;  Laterality: Right;  . WRIST SURGERY Left 1969   cyst     Prior to Admission medications   Medication Sig Start Date End Date Taking? Authorizing Provider  alendronate (FOSAMAX) 70 MG tablet 70 mg once a week.    Yes [provider]  Ascorbic Acid (VITAMIN C) 1000 MG tablet Take 1,000 mg by mouth daily.   Yes [provider]  aspirin EC 81 MG tablet Take 81 mg by mouth daily.   Yes [provider]  atorvastatin (LIPITOR) 80 MG tablet Take 80 mg by mouth daily at 6 PM.  02/09/16  Yes [provider]  Biotin  5 MG CAPS Take 5 mg by mouth 2 (two) times a day.   Yes [provider]  Cholecalciferol 125 MCG (5000 UT) TABS Take 1 capsule by mouth 2 (two) times daily.   Yes [provider]  Coenzyme Q10 (COQ10) 100 MG CAPS Take 100 mg by mouth daily.   Yes [provider]  losartan (COZAAR) 50 MG tablet Take 0.5 tablets (25 mg total) by mouth daily. Increase to 1 tab (50 mg ) in 4-5 days if BP >782 systolic 05/11/51  Yes Fritzi Mandes, MD  metoprolol tartrate (LOPRESSOR) 25  MG tablet Take 25 mg by mouth 2 (two) times daily.  11/05/15 07/27/26 Yes [provider]  ondansetron (ZOFRAN) 8 MG tablet Take 1 tablet (8 mg total) by mouth every 8 (eight) hours as needed (Nausea or vomiting). 09/29/19  Yes Fritzi Mandes, MD  predniSONE (DELTASONE) 10 MG tablet Take 2 tablets (20 mg total) by mouth daily. Patient taking differently: Take 15 mg by mouth daily.  09/29/19  Yes Fritzi Mandes, MD  sulfamethoxazole-trimethoprim (BACTRIM DS) 800-160 MG tablet Take 1 tablet by mouth 3 (three) times a week. 08/02/19  Yes [provider]     Allergies Mushroom extract complex, Nickel, and Shellfish allergy   Family History  Adopted: Yes    Social History Social History   Tobacco Use  . Smoking status: Former Smoker    Packs/day: 1.00    Years: 15.00    Pack years: 15.00    Types: Cigarettes    Quit date: 06/12/1985    Years since quitting: 34.3  . Smokeless tobacco: Never Used  Vaping Use  . Vaping Use: Never used  Substance Use Topics  . Alcohol use: Yes    Alcohol/week: 0.0 standard drinks    Comment: rarely  . Drug use: No    Review of Systems  Constitutional:   No fever positive chills.  ENT:   No sore throat. No rhinorrhea. Cardiovascular:   No chest pain or syncope. Respiratory:   No dyspnea or cough. Gastrointestinal:   Negative for abdominal pain, vomiting and diarrhea.  Musculoskeletal:   Negative for focal pain or swelling All other systems reviewed and are negative except as documented above in ROS and HPI.  ____________________________________________   PHYSICAL EXAM:  VITAL SIGNS: ED Triage Vitals  Enc Vitals Group     BP 09/30/19 1949 (!) 72/44     Pulse Rate 09/30/19 1949 96     Resp 09/30/19 1949 (!) 24     Temp 09/30/19 1949 (!) 89.1 F (31.7 C)     Temp src --      SpO2 09/30/19 1949 98 %     Weight 09/30/19 1932 196 lb (88.9 kg)     Height 09/30/19 1932 5\' 7"  (1.702 m)     Head Circumference --      Peak Flow --       Pain Score 09/30/19 1931 6     Pain Loc --      Pain Edu? --      Excl. in [place]? --     Vital signs reviewed, nursing assessments reviewed.   Constitutional:   Alert and oriented.  Ill-appearing Eyes:   Conjunctivae are normal. EOMI. PERRL. ENT      Head:   Normocephalic and atraumatic.      Nose:   Normal      Mouth/Throat: Dry mucous membranes      Neck:   No meningismus. Full ROM.  Hematological/Lymphatic/Immunilogical:   No cervical lymphadenopathy. Cardiovascular:   RRR. Symmetric bilateral radial and DP pulses.  No murmurs. Cap refill less than 2 seconds. Respiratory:   Normal respiratory effort without tachypnea/retractions. Breath sounds are clear and equal bilaterally. No wheezes/rales/rhonchi. Gastrointestinal:   Soft with right upper quadrant tenderness. Non distended. There is no CVA tenderness.  No rebound, rigidity, or guarding.  Musculoskeletal:   Normal range of motion in all extremities. No joint effusions.  No lower extremity tenderness.  No edema. Neurologic:   Normal speech and language.  Motor grossly intact. No acute focal neurologic deficits are appreciated.  Skin:    Skin is warm, dry and intact. No rash noted.  No petechiae, purpura, or bullae.  ____________________________________________    LABS (pertinent positives/negatives) (all labs ordered are listed, but only abnormal results are displayed) Labs Reviewed  COMPREHENSIVE METABOLIC PANEL - Abnormal; Notable for the following components:      Result Value   CO2 21 (*)    Glucose, Bld 113 (*)    Creatinine, Ser 1.54 (*)    Albumin 3.0 (*)    AST 358 (*)    ALT 218 (*)    Alkaline Phosphatase 900 (*)    Total Bilirubin 2.2 (*)    GFR calc non Af Amer 33 (*)    GFR calc Af Amer 38 (*)    Anion gap 16 (*)    All other components within normal limits  LIPASE, BLOOD - Abnormal; Notable for the following components:   Lipase 73 (*)    All other components within normal limits  SALICYLATE  LEVEL - Abnormal; Notable for the following components:   Salicylate Lvl <2.4 (*)    All other components within normal limits  CBC WITH DIFFERENTIAL/PLATELET - Abnormal; Notable for the following components:   WBC 13.2 (*)    RDW 15.8 (*)    Platelets 135 (*)    nRBC 0.5 (*)    Neutro Abs 12.5 (*)    Lymphs Abs 0.2 (*)    Abs Immature Granulocytes 0.16 (*)    All other components within normal limits  LACTIC ACID, PLASMA - Abnormal; Notable for the following components:   Lactic Acid, Venous 5.9 (*)    All other components within normal limits  LACTIC ACID, PLASMA - Abnormal; Notable for the following components:   Lactic Acid, Venous 4.4 (*)    All other components within normal limits  FIBRIN DERIVATIVES D-DIMER ([facility] ONLY) - Abnormal; Notable for the following components:   Fibrin derivatives D-dimer ([facility]) >7,500.00 (*)    All other components within normal limits  PHOSPHORUS - Abnormal; Notable for the following components:   Phosphorus 2.3 (*)    All other components within normal limits  T4, FREE - Abnormal; Notable for the following components:   Free T4 1.37 (*)    All other components within normal limits  TSH - Abnormal; Notable for the following components:   TSH 5.203 (*)    All other components within normal limits  TROPONIN I (HIGH SENSITIVITY) - Abnormal; Notable for the following components:   Troponin I (High Sensitivity) 160 (*)    All other components within normal limits  TROPONIN I (HIGH SENSITIVITY) - Abnormal; Notable for the following components:   Troponin I (High Sensitivity) 111 (*)    All other components within normal limits  CULTURE, BLOOD (SINGLE)  CULTURE, BLOOD (SINGLE)  ACETAMINOPHEN LEVEL  ETHANOL  MAGNESIUM  URINALYSIS, COMPLETE (UACMP) WITH MICROSCOPIC  LACTIC  ACID, PLASMA   ____________________________________________   EKG  Interpreted by me Sinus rhythm rate of 98, normal axis and intervals.  Normal QRS ST segments and T  waves  ____________________________________________    RADIOLOGY  US ABDOMEN LIMITED RUQ  Result Date: 09/30/2019 CLINICAL DATA:  Right upper quadrant abdominal pain EXAM: ULTRASOUND ABDOMEN LIMITED RIGHT UPPER QUADRANT COMPARISON:  None. FINDINGS: Gallbladder: The gallbladder wall is thickened measuring approximately 5 mm. The sonographic Percell Miller sign is negative. There is cholelithiasis with gallbladder sludge. Common bile duct: Diameter: 8 mm Liver: Innumerable hepatic masses are noted throughout the liver. Portal vein is patent on color Doppler imaging with normal direction of blood flow towards the liver. Other: None. IMPRESSION: 1. Cholelithiasis without definite sonographic evidence for acute cholecystitis. 2. Mildly thickened gallbladder wall which is nonspecific but can be seen in patients with underlying hepatocellular disease. 3. Innumerable hepatic masses are again noted, consistent with metastatic disease. Electronically Signed   By: Constance Holster M.D.   On: 09/30/2019 21:23    ____________________________________________   PROCEDURES .Critical Care Performed by: Carrie Mew, MD Authorized by: Carrie Mew, MD   Critical care provider statement:    Critical care time (minutes):  35   Critical care time was exclusive of:  Separately billable procedures and treating other patients   Critical care was necessary to treat or prevent imminent or life-threatening deterioration of the following conditions:  Sepsis, shock and dehydration   Critical care was time spent personally by me on the following activities:  Development of treatment plan with patient or surrogate, discussions with consultants, evaluation of patient's response to treatment, examination of patient, obtaining history from patient or surrogate, ordering and performing treatments and interventions, ordering and review of laboratory studies, ordering and review of radiographic studies, pulse oximetry,  re-evaluation of patient's condition and review of old charts    ____________________________________________  DIFFERENTIAL DIAGNOSIS   Cholecystitis, choledocholithiasis, intra-abdominal abscess, bowel perforation, bowel obstruction, dehydration, electrolyte abnormality, UTI, sepsis, hypothyroidism, non-STEMI, aortic dissection, PE, pericardial effusion  CLINICAL IMPRESSION / ASSESSMENT AND PLAN / ED COURSE  Medications ordered in the ED: Medications  sodium chloride 0.9 % bolus 1,000 mL (0 mLs Intravenous Stopped 09/30/19 2057)  ondansetron (ZOFRAN) injection 4 mg (4 mg Intravenous Given 09/30/19 1953)  sodium chloride 0.9 % bolus 2,000 mL (2,000 mLs Intravenous New Bag/Given 09/30/19 2057)  ceFEPIme (MAXIPIME) 2 g in sodium chloride 0.9 % 100 mL IVPB (0 g Intravenous Stopped 09/30/19 2157)  metroNIDAZOLE (FLAGYL) IVPB 500 mg (500 mg Intravenous New Bag/Given 09/30/19 2157)  iohexol (OMNIPAQUE) 350 MG/ML injection 75 mL (75 mLs Intravenous Contrast Given 09/30/19 2146)    Pertinent labs & imaging results that were available during my care of the patient were reviewed by me and considered in my medical decision making (see chart for details).  Faith [name] was evaluated in Emergency Department on 09/30/2019 for the symptoms described in the history of present illness. She was evaluated in the context of the global COVID-19 pandemic, which necessitated consideration that the patient might be at risk for infection with the SARS-CoV-2 virus that causes COVID-19. Institutional protocols and algorithms that pertain to the evaluation of patients at risk for COVID-19 are in a state of rapid change based on information released by regulatory bodies including the CDC and federal and state organizations. These policies and algorithms were followed during the patient's care in the ED.   Patient presents with severe generalized weakness, found to have hypotension, tachypnea, hypothermia on initial  vitals.   No other acute focal symptoms, exam does show right upper quadrant tenderness.  Sepsis work-up initiated, obtain right upper quadrant ultrasound, empiric cefepime and Flagyl.  Continue IV fluids for resuscitation.  Clinical Course as of Sep 30 2310  Nancy Fetter Sep 30, 2019  2134 Lactic acid level was 5.9 compared to about 2 from 2 days ago.  Troponin is 160 compared to 21 2 days ago.  Concerning for septic shock versus cardiovascular disease such as NSTEMI, PE, aortic dissection.  Ultrasound abdomen shows cholelithiasis, mildly thickened gallbladder wall but negative sonographic Murphy sign.  D-dimer is unmeasurably high.  Troponin is elevated which is new.  Will obtain CT angiogram chest abdomen pelvis.   [PS]  2300 Case d/w surgery Dr. Celine Ahr, who advises that findings are likely due to progression of metastatic liver disease, doubts cholecystitis / choledocho.  Agrees with hospitalist admission.   [PS]  2301 Repeat troponin improved, not c/w ACS, most likely due to effect of hypotension and shock.   [PS]    Clinical Course User Index [PS] Carrie Mew, MD     ----------------------------------------- 10:38 PM on 09/30/2019 -----------------------------------------  CT report: IMPRESSION: Vascular  1. No evidence of acute aortic syndrome. 2. Aortic Atherosclerosis (ICD10-I70.0). 3. Three-vessel coronary artery disease. 4. Moderate ostial plaque narrowing of the celiac, bilateral renal arteries and IMA. 5. Hook-like configuration of the celiac axis may reflect compression by the median arcuate ligament/arcuate ligament syndrome.  Nonvascular  1. Increasingly conspicuous gallbladder wall thickening towards the fundus with some pericholecystic inflammation. Correlate with upper abdominal symptoms and consider right upper quadrant ultrasound for further evaluation. 2. Nonspecific fluid-filled appearance of the distal small bowel some questionable mucosal hyperemia. Correlate for  features of enteritis. 3. Innumerable hypoattenuating lesions throughout the liver including larger confluent lesions spanning segments 4, 5 and 8, largest again measuring up to 8.2 cm in size though poorly characterized compared to the portal venous study is performed /2021, 09/06/2019). Difficult to assess for new or enlarging lesions in this arterial phase exam. 4. Developing ascites throughout the abdomen, could reflect hepatic dysfunction secondary to the extensive involvement of the liver versus reactive free fluid, correlate with serologies. 5. Small right adrenal nodule, concerning for metastasis. 6. Multiple ill-defined sclerotic foci throughout the thoracic spine, bilateral ribs, and sternum, concerning for osseous metastatic disease. 7. Nonspecific endometrial thickening to 7 mm, greater than expected for this advanced age female. Recommend further evaluation with nonemergent pelvic ultrasound. 8. Colonic diverticulosis without evidence of diverticulitis. 9. Small sliding-type hiatal hernia. 10. Aortic Atherosclerosis (ICD10-I70.0).  Will d/w surgery and plan to admit to hospitalist.   ____________________________________________   FINAL CLINICAL IMPRESSION(S) / ED DIAGNOSES    Final diagnoses:  Septic shock ([place])  Metastatic malignant neoplasm, unspecified site [facility])  Elevated troponin  Elevated LFTs     ED Discharge Orders    None      Portions of this note were generated with dragon dictation software. Dictation errors may occur despite best attempts at proofreading.   Carrie Mew, MD 09/30/19 2312

## 2019-10-01 ENCOUNTER — Other Ambulatory Visit: Payer: Medicare Other

## 2019-10-01 ENCOUNTER — Ambulatory Visit: Payer: Medicare Other | Admitting: Hematology and Oncology

## 2019-10-01 ENCOUNTER — Ambulatory Visit: Payer: Medicare Other

## 2019-10-01 DIAGNOSIS — R7989 Other specified abnormal findings of blood chemistry: Secondary | ICD-10-CM

## 2019-10-01 DIAGNOSIS — R188 Other ascites: Secondary | ICD-10-CM

## 2019-10-01 DIAGNOSIS — C50812 Malignant neoplasm of overlapping sites of left female breast: Secondary | ICD-10-CM

## 2019-10-01 DIAGNOSIS — C7951 Secondary malignant neoplasm of bone: Secondary | ICD-10-CM

## 2019-10-01 DIAGNOSIS — C50919 Malignant neoplasm of unspecified site of unspecified female breast: Secondary | ICD-10-CM

## 2019-10-01 DIAGNOSIS — K8020 Calculus of gallbladder without cholecystitis without obstruction: Secondary | ICD-10-CM

## 2019-10-01 DIAGNOSIS — C787 Secondary malignant neoplasm of liver and intrahepatic bile duct: Secondary | ICD-10-CM

## 2019-10-01 DIAGNOSIS — N1831 Chronic kidney disease, stage 3a: Secondary | ICD-10-CM

## 2019-10-01 DIAGNOSIS — Z170 Estrogen receptor positive status [ER+]: Secondary | ICD-10-CM

## 2019-10-01 DIAGNOSIS — A419 Sepsis, unspecified organism: Secondary | ICD-10-CM

## 2019-10-01 DIAGNOSIS — C799 Secondary malignant neoplasm of unspecified site: Secondary | ICD-10-CM

## 2019-10-01 DIAGNOSIS — D72829 Elevated white blood cell count, unspecified: Secondary | ICD-10-CM | POA: Diagnosis present

## 2019-10-01 LAB — URINALYSIS, COMPLETE (UACMP) WITH MICROSCOPIC
Bilirubin Urine: NEGATIVE
Glucose, UA: NEGATIVE mg/dL
Hgb urine dipstick: NEGATIVE
Ketones, ur: NEGATIVE mg/dL
Leukocytes,Ua: NEGATIVE
Nitrite: NEGATIVE
Protein, ur: 100 mg/dL — AB
Specific Gravity, Urine: >1.046 — ABNORMAL HIGH (ref 1.005–1.030)
WBC, UA: >50 WBC/hpf — ABNORMAL HIGH (ref 0–5)
pH: 5 (ref 5.0–8.0)

## 2019-10-01 LAB — COMPREHENSIVE METABOLIC PANEL
ALT: 220 U/L — ABNORMAL HIGH (ref 0–44)
AST: 415 U/L — ABNORMAL HIGH (ref 15–41)
Albumin: 2.4 g/dL — ABNORMAL LOW (ref 3.5–5.0)
Alkaline Phosphatase: 615 U/L — ABNORMAL HIGH (ref 38–126)
Anion gap: 12 (ref 5–15)
BUN: 17 mg/dL (ref 8–23)
CO2: 21 mmol/L — ABNORMAL LOW (ref 22–32)
Calcium: 8.7 mg/dL — ABNORMAL LOW (ref 8.9–10.3)
Chloride: 108 mmol/L (ref 98–111)
Creatinine, Ser: 1.56 mg/dL — ABNORMAL HIGH (ref 0.44–1.00)
GFR calc Af Amer: 37 mL/min — ABNORMAL LOW (ref >60)
GFR calc non Af Amer: 32 mL/min — ABNORMAL LOW (ref >60)
Glucose, Bld: 115 mg/dL — ABNORMAL HIGH (ref 70–99)
Potassium: 4.6 mmol/L (ref 3.5–5.1)
Sodium: 141 mmol/L (ref 135–145)
Total Bilirubin: 2.1 mg/dL — ABNORMAL HIGH (ref 0.3–1.2)
Total Protein: 5.5 g/dL — ABNORMAL LOW (ref 6.5–8.1)

## 2019-10-01 LAB — PROCALCITONIN: Procalcitonin: 7.07 ng/mL

## 2019-10-01 LAB — CBC
HCT: 35.2 % — ABNORMAL LOW (ref 36.0–46.0)
Hemoglobin: 12.1 g/dL (ref 12.0–15.0)
MCH: 29.4 pg (ref 26.0–34.0)
MCHC: 34.4 g/dL (ref 30.0–36.0)
MCV: 85.6 fL (ref 80.0–100.0)
Platelets: 111 10*3/uL — ABNORMAL LOW (ref 150–400)
RBC: 4.11 MIL/uL (ref 3.87–5.11)
RDW: 16.1 % — ABNORMAL HIGH (ref 11.5–15.5)
WBC: 20.8 10*3/uL — ABNORMAL HIGH (ref 4.0–10.5)
nRBC: 0.7 % — ABNORMAL HIGH (ref 0.0–0.2)

## 2019-10-01 LAB — GLUCOSE, CAPILLARY
Glucose-Capillary: 118 mg/dL — ABNORMAL HIGH (ref 70–99)
Glucose-Capillary: 130 mg/dL — ABNORMAL HIGH (ref 70–99)
Glucose-Capillary: 190 mg/dL — ABNORMAL HIGH (ref 70–99)

## 2019-10-01 LAB — LACTATE DEHYDROGENASE: LDH: 4058 U/L — ABNORMAL HIGH (ref 98–192)

## 2019-10-01 LAB — PROTIME-INR
INR: 1.1 (ref 0.8–1.2)
Prothrombin Time: 14.1 seconds (ref 11.4–15.2)

## 2019-10-01 LAB — CORTISOL-AM, BLOOD: Cortisol - AM: 87.3 ug/dL — ABNORMAL HIGH (ref 6.7–22.6)

## 2019-10-01 LAB — BILIRUBIN, DIRECT: Bilirubin, Direct: 0.9 mg/dL — ABNORMAL HIGH (ref 0.0–0.2)

## 2019-10-01 LAB — SARS CORONAVIRUS 2 BY RT PCR (HOSPITAL ORDER, PERFORMED IN ~~LOC~~ HOSPITAL LAB): SARS Coronavirus 2: NEGATIVE

## 2019-10-01 LAB — LACTIC ACID, PLASMA: Lactic Acid, Venous: 3.1 mmol/L (ref 0.5–1.9)

## 2019-10-01 MED ORDER — K PHOS MONO-SOD PHOS DI & MONO 155-852-130 MG PO TABS
500.0000 mg | ORAL_TABLET | ORAL | Status: AC
  Administered 2019-10-01: 500 mg via ORAL
  Filled 2019-10-01 (×2): qty 2

## 2019-10-01 MED ORDER — HYDROCORTISONE NA SUCCINATE PF 100 MG IJ SOLR
100.0000 mg | Freq: Once | INTRAMUSCULAR | Status: AC
  Administered 2019-10-01: 100 mg via INTRAVENOUS
  Filled 2019-10-01: qty 2

## 2019-10-01 MED ORDER — VANCOMYCIN HCL IN DEXTROSE 1-5 GM/200ML-% IV SOLN
1000.0000 mg | INTRAVENOUS | Status: DC
  Filled 2019-10-01: qty 200

## 2019-10-01 NOTE — H&P (Signed)
[place]   PATIENT NAME: Faith [name]    MR#:  [id number]  DATE OF BIRTH:  1945/07/03  DATE OF ADMISSION:  09/30/2019  PRIMARY CARE PHYSICIAN: Leonel Ramsay, MD   REQUESTING/REFERRING PHYSICIAN: Brenton Grills, MD  CHIEF COMPLAINT:   Chief Complaint  Patient presents with  . Weakness  . Abdominal Pain    HISTORY OF PRESENT ILLNESS:  Faith [name]  is a 74 y.o. female with a known history of coronary artery disease, type diabetes mellitus, hypertension, dyslipidemia, presented to the emergency room with acute onset of generalized weakness and lightheadedness as well as abdominal pain.  He was admitted here on 9/10 and discharged yesterday when she was managed for hypotension and presyncope with acute kidney injury on stage IIIa chronic kidney disease.  She stated that she felt fine when she went home yesterday till this afternoon when she felt weak and dizzy and could not stand or walk.  She admitted to associated abdominal pain epigastric and right upper quadrant area.  She denied any nausea or vomiting or diarrhea.  No fever or chills.  No chest pain or dyspnea or cough or wheezing.  No dysuria, oliguria, urgency or frequency or flank pain.  Upon presentation to the emergency room, hypertension was 89.31F/31.7C, blood pressure 72/44 with a heart rate of 96 respiratory to 24 and pulse 70 was 98% on room air.  With clear bolus of IV normal saline her blood pressure was up to 130/48.  Labs were remarkable for anion gap of 16, magnesium of 1.9 with a potassium of 3.8 BUN of 17 with a creatinine of 1.54 compared to 21 and 0.97 on 9/11, high-sensitivity troponin I 160 and later 111, AST 358 ALT of 218 and alk phos of 900 with total bili of 2.2 albumin of 3 and total protein of 6.5.  The patient's alk phos was 669 on 9/10 with total bili of 2.3, AST is 225 and ALT 177.  Lactic acid came back 5.9 and later down to 4.4 from 1.8 on 9/10.  CBC showed leukocytosis of 13.2 with neutrophilia  as well as thrombocytopenia of 135.  WBCs were 10 on 9/11.  At 500 mL D-dimer was more than 7500, Tylenol level 10 and salicylate less than 7.  TSH was 4.2 and free T4 1.37 both slightly elevated.  Blood cultures were drawn. EKG showed sinus rhythm with rate of 98 with occasional PVCs and possible left atrial enlargement.  Right upper quadrant ultrasound revealed the following: 1. Cholelithiasis without definite sonographic evidence for acute cholecystitis. 2. Mildly thickened gallbladder wall which is nonspecific but can be seen in patients with underlying hepatocellular disease. 3. Innumerable hepatic masses are again noted, consistent with metastatic disease.  The patient was given IV cefepime and Flagyl, IV Zofran and 3 L bolus of IV normal saline.  She will be admitted to a progressive unit bed for further evaluation and management. PAST MEDICAL HISTORY:   Past Medical History:  Diagnosis Date  . Arthritis    bilateral knees  . Cancer [facility]) April 2015   left breast  . Coronary artery disease   . Diabetes mellitus without complication ([place])   . Hyperlipidemia   . Hypertension   . Myocardial infarction ([place]) 11/03/2015   [facility], stent placed  . Pneumonia     PAST SURGICAL HISTORY:   Past Surgical History:  Procedure Laterality Date  . BREAST SURGERY Bilateral 11/19/13   bilater mastectomy   . CARDIAC CATHETERIZATION    .  CORONARY ANGIOPLASTY    . PORT-A-CATH REMOVAL  2016  . PORTACATH PLACEMENT  06-07-13  . SHOULDER OPEN ROTATOR CUFF REPAIR  2012  . TOTAL KNEE ARTHROPLASTY Left 03/28/2018   Procedure: TOTAL KNEE ARTHROPLASTY-LEFT;  Surgeon: Hessie Knows, MD;  Location: [facility] ORS;  Service: Orthopedics;  Laterality: Left;  . TOTAL KNEE ARTHROPLASTY Right 08/01/2018   Procedure: RIGHT TOTAL KNEE ARTHROPLASTY;  Surgeon: Hessie Knows, MD;  Location: [facility] ORS;  Service: Orthopedics;  Laterality: Right;  . WRIST SURGERY Left 1969   cyst    SOCIAL HISTORY:   Social History    Tobacco Use  . Smoking status: Former Smoker    Packs/day: 1.00    Years: 15.00    Pack years: 15.00    Types: Cigarettes    Quit date: 06/12/1985    Years since quitting: 34.3  . Smokeless tobacco: Never Used  Substance Use Topics  . Alcohol use: Yes    Alcohol/week: 0.0 standard drinks    Comment: rarely    FAMILY HISTORY:   Family History  Adopted: Yes    DRUG ALLERGIES:   Allergies  Allergen Reactions  . Mushroom Extract Complex Hives and Swelling  . Nickel Rash  . Shellfish Allergy Nausea And Vomiting    REVIEW OF SYSTEMS:   ROS As per history of present illness. All pertinent systems were reviewed above. Constitutional, HEENT, cardiovascular, respiratory, GI, GU, musculoskeletal, neuro, psychiatric, endocrine, integumentary and hematologic systems were reviewed and are otherwise negative/unremarkable except for positive findings mentioned above in the HPI.   MEDICATIONS AT HOME:   Prior to Admission medications   Medication Sig Start Date End Date Taking? Authorizing Provider  alendronate (FOSAMAX) 70 MG tablet 70 mg once a week.    Yes [provider]  Ascorbic Acid (VITAMIN C) 1000 MG tablet Take 1,000 mg by mouth daily.   Yes [provider]  aspirin EC 81 MG tablet Take 81 mg by mouth daily.   Yes [provider]  atorvastatin (LIPITOR) 80 MG tablet Take 80 mg by mouth daily at 6 PM.  02/09/16  Yes [provider]  Biotin 5 MG CAPS Take 5 mg by mouth 2 (two) times a day.   Yes [provider]  Cholecalciferol 125 MCG (5000 UT) TABS Take 1 capsule by mouth 2 (two) times daily.   Yes [provider]  Coenzyme Q10 (COQ10) 100 MG CAPS Take 100 mg by mouth daily.   Yes [provider]  losartan (COZAAR) 50 MG tablet Take 0.5 tablets (25 mg total) by mouth daily. Increase to 1 tab (50 mg ) in 4-5 days if BP >030 systolic 0/92/33  Yes Fritzi Mandes, MD  metoprolol tartrate (LOPRESSOR) 25 MG tablet Take  25 mg by mouth 2 (two) times daily.  11/05/15 07/27/26 Yes [provider]  ondansetron (ZOFRAN) 8 MG tablet Take 1 tablet (8 mg total) by mouth every 8 (eight) hours as needed (Nausea or vomiting). 09/29/19  Yes Fritzi Mandes, MD  predniSONE (DELTASONE) 10 MG tablet Take 2 tablets (20 mg total) by mouth daily. Patient taking differently: Take 15 mg by mouth daily.  09/29/19  Yes Fritzi Mandes, MD  sulfamethoxazole-trimethoprim (BACTRIM DS) 800-160 MG tablet Take 1 tablet by mouth 3 (three) times a week. 08/02/19  Yes [provider]      VITAL SIGNS:  Blood pressure (!) 130/48, pulse 82, temperature (!) 89.1 F (31.7 C), resp. rate (!) 22, height _0  (1.702 m), weight 88.9 kg,  SpO2 93 %.  PHYSICAL EXAMINATION:  Physical Exam  GENERAL:  74 y.o.-year-old Caucasian female patient lying in the bed with no acute distress.  EYES: Pupils equal, round, reactive to light and accommodation.  Positive scleral icterus. Extraocular muscles intact.  HEENT: Head atraumatic, normocephalic. Oropharynx and nasopharynx clear.  NECK:  Supple, no jugular venous distention. No thyroid enlargement, no tenderness.  LUNGS: Normal breath sounds bilaterally, no wheezing, rales,rhonchi or crepitation. No use of accessory muscles of respiration.  CARDIOVASCULAR: Regular rate and rhythm, S1, S2 normal. No murmurs, rubs, or gallops.  ABDOMEN: Soft, mildly distended with epigastric and right upper quadrant tenderness with weakly positive Murphy sign and no rebound tenderness guarding or rigidity.. Bowel sounds present.  She had a palpable hepatomegaly. EXTREMITIES: No pedal edema, cyanosis, or clubbing.  NEUROLOGIC: Cranial nerves II through XII are intact. Muscle strength 5/5 in all extremities. Sensation intact. Gait not checked.  PSYCHIATRIC: The patient is alert and oriented x 3.  Normal affect and good eye contact. SKIN: No obvious rash, lesion, or ulcer.   LABORATORY PANEL:   CBC Recent Labs  Lab  09/30/19 1947  WBC 13.2*  HGB 14.0  HCT 42.0  PLT 135*   ------------------------------------------------------------------------------------------------------------------  Chemistries  Recent Labs  Lab 09/30/19 1947  NA 141  K 3.8  CL 104  CO2 21*  GLUCOSE 113*  BUN 17  CREATININE 1.54*  CALCIUM 10.2  MG 1.9  AST 358*  ALT 218*  ALKPHOS 900*  BILITOT 2.2*   ------------------------------------------------------------------------------------------------------------------  Cardiac Enzymes No results for input(s): TROPONINI in the last 168 hours. ------------------------------------------------------------------------------------------------------------------  RADIOLOGY:  US ABDOMEN LIMITED RUQ  Result Date: 09/30/2019 CLINICAL DATA:  Right upper quadrant abdominal pain EXAM: ULTRASOUND ABDOMEN LIMITED RIGHT UPPER QUADRANT COMPARISON:  None. FINDINGS: Gallbladder: The gallbladder wall is thickened measuring approximately 5 mm. The sonographic Percell Miller sign is negative. There is cholelithiasis with gallbladder sludge. Common bile duct: Diameter: 8 mm Liver: Innumerable hepatic masses are noted throughout the liver. Portal vein is patent on color Doppler imaging with normal direction of blood flow towards the liver. Other: None. IMPRESSION: 1. Cholelithiasis without definite sonographic evidence for acute cholecystitis. 2. Mildly thickened gallbladder wall which is nonspecific but can be seen in patients with underlying hepatocellular disease. 3. Innumerable hepatic masses are again noted, consistent with metastatic disease. Electronically Signed   By: Constance Holster M.D.   On: 09/30/2019 21:23      IMPRESSION AND PLAN:   1.  Systemic inflammatory response syndrome, rule out sepsis.  This is manifested by hypothermia, tachypnea and leukocytosis.  She has no clear source of infection at this time but her urinalysis is pending.  She has hypotension and significantly elevated  lactic acid that could be associated with severe sepsis and septic shock should she have a clear source of infection.  She has cholelithiasis with combined hepatocellular and possibly obstructive jaundice without clear evidence of acute cholecystitis. -She will be admitted to a progressive unit bed. -She has responded to initial bolus of IV normal saline. -She has been on prednisone and therefore we will place her on stress dose of Solu-Cortef. -We will empirically continue antibiotic therapy with IV cefepime, Flagyl and vancomycin. -We will follow blood and urine cultures. -We will follow lactic acid level that is currently trending down after IV antibiotic therapy. -She will be warmed with bear hugger given her hypothermia. -She will be continued on IV hydration with normal saline. -A general surgery consultation will be obtained. -I notified  Dr. Celine Ahr about the patient. -We will also obtain a GI consultation. -I notified Dr. Bonna Gains about the patient.   2.  Acute kidney injury on top of stage IIIa chronic kidney disease. -The patient will be hydrated with IV normal saline and will follow her BMP. -Nephrotoxins will be held off.  3.  Generalized weakness and lightheadedness. -These are likely related to her hypotension, dehydration and acute kidney injury. -We will hydrate with IV normal saline for her orthostatics.  4.  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 -Oncology follow-up will be obtained. -I notified Dr. Janese Banks about the patient. -Pain management will be provided.  5.  Dyslipidemia. -Statin therapy will be held off given elevated LFTs.  6.  History of hypertension. -Antihypertensives will be held off pending stability of her blood pressure, at which time we will resume her Lopressor and continue to hold off her Cozaar to resolution of acute kidney injury.  7.  DVT prophylaxis. -Subcutaneous Lovenox.   All the records are reviewed and  case discussed with ED provider. The plan of care was discussed in details with the patient (and family). I answered all questions. The patient agreed to proceed with the above mentioned plan. Further management will depend upon hospital course.   CODE STATUS: Full code  Status is: Inpatient  Remains inpatient appropriate because:Ongoing active pain requiring inpatient pain management, Ongoing diagnostic testing needed not appropriate for outpatient work up, Unsafe d/c plan, IV treatments appropriate due to intensity of illness or inability to take PO and Inpatient level of care appropriate due to severity of illness   Dispo: The patient is from: Home              Anticipated d/c is to: Home              Anticipated d/c date is: 3 days              Patient currently is not medically stable to d/c.   TOTAL TIME TAKING CARE OF THIS PATIENT: 55 minutes.    Christel Mormon M.D on 10/01/2019 at 12:09 AM  [facility]   From 7 PM-7 AM, contact night-coverage [email or web address]  CC: Primary care physician; Leonel Ramsay, MD   Note: This dictation was prepared with Dragon dictation along with smaller phrase technology. Any transcriptional typo errors that result from this process are unintentional.

## 2019-10-01 NOTE — Consult Note (Signed)
[facility]  Date of admission:  10/01/2019  Inpatient day:  10/01/2019  Consulting physician: Dr Eugenie Norrie.  Reason for Consultation:  Metastatic breast cancer.  Chief Complaint: Faith [name] is a 74 y.o. female with metastatic breast cancer who was admitted through the emergency room with hypotension, tachypnea, and hypothermia concerning for sepsis.  HPI:  The patient was diagnosed with metastatic breast cancer in 04/2013.  She was initially treated with chemotherapy followed by Femara and Ibrance.  She discontinued treatment in 02/2018.    She was diagnosed with giant cell arteritis in 05/2019 after presenting with monocular vision.  She was treated with high dose steroids.  She is on prednisone 20 mg a day.  She was recently noted to have rapidly increasing LFTs.  Abdominal and pelvic CT on 09/06/2019 revealed > 15 liver masses (largest 8.2 x 4.4 cm) and sclerotic disease c/w bone metastasis.  Ultrasound guided liver biopsy on 09/18/2019 confirmed metastatic breast cancer (ER+, PR-, and Her2/neu -).  She was scheduled to initiate chemotherapy on 10/01/2019.  She was admitted to [facility] from 09/28/2019 - 09/29/2019 with weakness and near syncope felt secondary to hypotension due to dehydration.  Initial BP 72/52.  She was afebrile.  WBC was 10,600.  Lactic acid was 2.3 then 1.8.  COVID-19 testing was negative.  She received stress dose steroids and IVF.  Creatinine improved from 1.41 to 0.96.  Anti-hypertensive medications were restarted.   Chest, abdomen, and pelvis CT on 09/28/2019 revealed no acute intrathoracic or intra-abdominal process.  There was unchanged hepatic and osseous metastases and suspected small right adrenal metastasis.  There was a new 3 mm pulmonary nodule in the peripheral left lower lobe, indeterminate and new trace ascites.  She presented to the ER on 09/30/2019 with weakness, lightheadedness and RUQ abdominal pain.  BP was 72/44, temperature 89.1  and O2 sats 98%. WBC was initially 13,200 then 20,800 today.  Lactic acid was 5.9 then 4.4 then 3.1.  Blood and urine cultures are negative to date.  COVD-19 testing was negative.  She received vancomycin x 1, Cefepime and Flagyl.  RUQ ultrasound revealed cholelithiasis without evidence of cholecystitis, thickened gallbladder wall and innumerable liver metastasis.    Chest, abdomen, and pelvis CT angiogram on 09/30/2019 revealed increasingly conspicuous gallbladder wall thickening with some pericholecystic inflammation, nonspecific fluid-filled distal small bowel, innumerable liver lesions, and developing ascites (? secondary to liver dysfunction), small right adrenal nodule, bone metastasis, and nonspecific endometrial thickening.  She has been seen by Dr Fredirick Maudlin.  She was not felt to require intervention regarding her gallbladder disease. Leukocytosis felt possibly related to IV steroids (100 mg hydrocortisone on 10/01/2019).  She has been seen by Dr Jonathon Bellows.  She was felt to have abnormal LFTs secondary to liver and bone metastasis.  There was a low likelihood of biliary obstruction.  Paracentesis was suggested to r/o peritonitis.  Chemistries followed: 09/06/2019:  AST 173.  ALT 197.  Alkaline phosphatase 594.  Bilirubin 1.1.  Creatinine 1.08. 09/25/2019:  AST 239.  ALT 226.  Alkaline phosphatase 669.  Bilirubin 1.5.  Creatinine 1.09. 09/28/2019:  AST 225.  ALT 177.  Alkaline phosphatase 669.  Bilirubin 2.3.  Creatinine 1.41. 09/30/2019:  AST 358.  ALT 218.  Alkaline phosphatase 900.  Bilirubin 2.2.  Creatinine 1.54. 10/01/2019:  AST 415.  ALT 220.  Alkaline phosphatase 615.  Bilirubin 2.1.  Creatinine 1.56.  Symptomatically, she is feeling much better after fluids and initiation of antibiotics.  She has residual RUQ pain.   Past Medical History:  Diagnosis Date  . Arthritis    bilateral knees  . Cancer [facility]) April 2015   left breast  . Coronary artery disease   . Diabetes  mellitus without complication ([place])   . Hyperlipidemia   . Hypertension   . Myocardial infarction ([place]) 11/03/2015   [facility], stent placed  . Pneumonia     Past Surgical History:  Procedure Laterality Date  . BREAST SURGERY Bilateral 11/19/13   bilater mastectomy   . CARDIAC CATHETERIZATION    . CORONARY ANGIOPLASTY    . PORT-A-CATH REMOVAL  2016  . PORTACATH PLACEMENT  06-07-13  . SHOULDER OPEN ROTATOR CUFF REPAIR  2012  . TOTAL KNEE ARTHROPLASTY Left 03/28/2018   Procedure: TOTAL KNEE ARTHROPLASTY-LEFT;  Surgeon: Hessie Knows, MD;  Location: [facility] ORS;  Service: Orthopedics;  Laterality: Left;  . TOTAL KNEE ARTHROPLASTY Right 08/01/2018   Procedure: RIGHT TOTAL KNEE ARTHROPLASTY;  Surgeon: Hessie Knows, MD;  Location: [facility] ORS;  Service: Orthopedics;  Laterality: Right;  . WRIST SURGERY Left 1969   cyst    Family History  Adopted: Yes    Social History:  reports that she quit smoking about 34 years ago. Her smoking use included cigarettes. She has a 15.00 pack-year smoking history. She has never used smokeless tobacco. She reports current alcohol use. She reports that she does not use drugs.  The patient denies any exposure to radiation or toxins.  The patient lives in [place].  She is alone today.  Allergies:  Allergies  Allergen Reactions  . Mushroom Extract Complex Hives and Swelling  . Nickel Rash  . Shellfish Allergy Nausea And Vomiting    Medications Prior to Admission  Medication Sig Dispense Refill  . alendronate (FOSAMAX) 70 MG tablet 70 mg once a week.     . Ascorbic Acid (VITAMIN C) 1000 MG tablet Take 1,000 mg by mouth daily.    Marland Kitchen aspirin EC 81 MG tablet Take 81 mg by mouth daily.    Marland Kitchen atorvastatin (LIPITOR) 80 MG tablet Take 80 mg by mouth daily at 6 PM.     . Biotin 5 MG CAPS Take 5 mg by mouth 2 (two) times a day.    . Cholecalciferol 125 MCG (5000 UT) TABS Take 1 capsule by mouth 2 (two) times daily.    . Coenzyme Q10 (COQ10) 100 MG CAPS Take 100 mg by mouth  daily.    Marland Kitchen losartan (COZAAR) 50 MG tablet Take 0.5 tablets (25 mg total) by mouth daily. Increase to 1 tab (50 mg ) in 4-5 days if BP >578 systolic 15 tablet 0  . metoprolol tartrate (LOPRESSOR) 25 MG tablet Take 25 mg by mouth 2 (two) times daily.     . ondansetron (ZOFRAN) 8 MG tablet Take 1 tablet (8 mg total) by mouth every 8 (eight) hours as needed (Nausea or vomiting). 30 tablet 2  . predniSONE (DELTASONE) 10 MG tablet Take 2 tablets (20 mg total) by mouth daily. (Patient taking differently: Take 15 mg by mouth daily. ) 10 tablet 0  . sulfamethoxazole-trimethoprim (BACTRIM DS) 800-160 MG tablet Take 1 tablet by mouth 3 (three) times a week.      Review of Systems: GENERAL:  Fatigue.  No fevers, sweats or weight loss. PERFORMANCE STATUS (ECOG):  2 HEENT:  No visual changes, runny nose, sore throat, mouth sores or tenderness. Lungs: No shortness of breath or cough.  No hemoptysis. Cardiac:  No chest pain,  palpitations, orthopnea, or PND. GI:  RUQ pain, improved.  No nausea, vomiting, diarrhea, constipation, melena or hematochezia. GU:  No urgency, frequency, dysuria, or hematuria. Musculoskeletal:  No back pain.  No joint pain.  No muscle tenderness. Extremities:  No pain or swelling. Skin:  No rashes or skin changes. Neuro:  No headache, numbness or weakness, balance or coordination issues. Endocrine:  Diabetes.  No thyroid issues, hot flashes or night sweats. Psych:  No mood changes, depression or anxiety. Pain:  No focal pain. Review of systems:  All other systems reviewed and found to be negative.  Physical Exam:  Blood pressure (!) 151/71, pulse 97, temperature 98.4 F (36.9 C), temperature source Oral, resp. rate 18, height '5\' 7"'  (1.702 m), weight 196 lb (88.9 kg), SpO2 94 %.  GENERAL: Fatigued appearing woman sitting comfortably on the medical unit in no acute distress. MENTAL STATUS:  Alert and oriented to person, place and time. HEAD:  Shoulder length hair.  Normocephalic,  atraumatic, face symmetric, no Cushingoid features. EYES:  Pupils equal round and reactive to light and accomodation.  Slight scleral icterus. ENT:  Oropharynx clear without lesion.  Tongue normal. Mucous membranes moist.  RESPIRATORY:  Clear to auscultation anteriorly without rales, wheezes or rhonchi. CARDIOVASCULAR:  Regular rate and rhythm with soft murmur.  No rub or gallop. ABDOMEN:  Soft, mild tenderness in the RUQ without guarding or rebound tenderness.  Liver palpable.  Active bowel sounds and no appreciable splenomegaly.  No masses. SKIN:  No rashes, ulcers or lesions. EXTREMITIES: Chronic lower extremity changes.  No splinter hemorrhages.  No skin discoloration or tenderness.  No palpable cords. LYMPH NODES: No palpable cervical, supraclavicular, axillary or inguinal adenopathy  NEUROLOGICAL: Unremarkable. PSYCH:  Appropriate.   Results for orders placed or performed during the hospital encounter of 09/30/19 (from the past 48 hour(s))  Blood culture (single)     Status: None (Preliminary result)   Collection Time: 09/30/19  7:47 PM   Specimen: BLOOD  Result Value Ref Range   Specimen Description BLOOD RIGHT AC    Special Requests      BOTTLES DRAWN AEROBIC AND ANAEROBIC Blood Culture results may not be optimal due to an excessive volume of blood received in culture bottles   Culture      NO GROWTH < 12 HOURS Performed at [facility], [address], [place], [place] [address]    Report Status PENDING   Acetaminophen level     Status: None   Collection Time: 09/30/19  7:47 PM  Result Value Ref Range   Acetaminophen (Tylenol), Serum 10 10 - 30 ug/mL    Comment: (NOTE) Therapeutic concentrations vary significantly. A range of 10-30 ug/mL  may be an effective concentration for many patients. However, some  are best treated at concentrations outside of this range. Acetaminophen concentrations >150 ug/mL at 4 hours after ingestion  and >50 ug/mL at 12 hours after  ingestion are often associated with  toxic reactions.  Performed at [facility], [address] [place], [place] [address]   Comprehensive metabolic panel     Status: Abnormal   Collection Time: 09/30/19  7:47 PM  Result Value Ref Range   Sodium 141 135 - 145 mmol/L   Potassium 3.8 3.5 - 5.1 mmol/L   Chloride 104 98 - 111 mmol/L   CO2 21 (L) 22 - 32 mmol/L   Glucose, Bld 113 (H) 70 - 99 mg/dL    Comment: Glucose reference range applies only to  samples taken after fasting for at least 8 hours.   BUN 17 8 - 23 mg/dL   Creatinine, Ser 1.54 (H) 0.44 - 1.00 mg/dL   Calcium 10.2 8.9 - 10.3 mg/dL   Total Protein 6.5 6.5 - 8.1 g/dL   Albumin 3.0 (L) 3.5 - 5.0 g/dL   AST 358 (H) 15 - 41 U/L   ALT 218 (H) 0 - 44 U/L   Alkaline Phosphatase 900 (H) 38 - 126 U/L   Total Bilirubin 2.2 (H) 0.3 - 1.2 mg/dL   GFR calc non Af Amer 33 (L) >60 mL/min   GFR calc Af Amer 38 (L) >60 mL/min   Anion gap 16 (H) 5 - 15    Comment: Performed at [facility], [address] [place], [place] [address]  Ethanol     Status: None   Collection Time: 09/30/19  7:47 PM  Result Value Ref Range   Alcohol, Ethyl (B) <10 <10 mg/dL    Comment: (NOTE) Lowest detectable limit for serum alcohol is 10 mg/dL.  For medical purposes only. Performed at [facility], [address] [place], [place] [address]   Lipase, blood     Status: Abnormal   Collection Time: 09/30/19  7:47 PM  Result Value Ref Range   Lipase 73 (H) 11 - 51 U/L    Comment: Performed at [facility], [address] [place], [place] [address]  Salicylate level     Status: Abnormal   Collection Time: 09/30/19  7:47 PM  Result Value Ref Range   Salicylate Lvl <6.0 (L) 7.0 - 30.0 mg/dL    Comment: Performed at [facility], [address] [place], [place] [address]  Troponin I (High Sensitivity)     Status: Abnormal   Collection Time: 09/30/19  7:47 PM  Result Value Ref Range   Troponin I  (High Sensitivity) 160 (HH) <18 ng/L    Comment: CRITICAL RESULT CALLED TO, READ BACK BY AND VERIFIED WITH JENNIFER ELLINGTON ON 09/30/19 AT 2020 BY JAG (NOTE) Elevated high sensitivity troponin I (hsTnI) values and significant  changes across serial measurements may suggest ACS but many other  chronic and acute conditions are known to elevate hsTnI results.  Refer to the "Links" section for chest pain algorithms and additional  guidance. Performed at [facility], [address] [place], [place] [address]   CBC with Differential     Status: Abnormal   Collection Time: 09/30/19  7:47 PM  Result Value Ref Range   WBC 13.2 (H) 4.0 - 10.5 K/uL   RBC 4.82 3.87 - 5.11 MIL/uL   Hemoglobin 14.0 12.0 - 15.0 g/dL   HCT 42.0 36 - 46 %   MCV 87.1 80.0 - 100.0 fL   MCH 29.0 26.0 - 34.0 pg   MCHC 33.3 30.0 - 36.0 g/dL   RDW 15.8 (H) 11.5 - 15.5 %   Platelets 135 (L) 150 - 400 K/uL    Comment: REPEATED TO VERIFY PLATELET COUNT CONFIRMED BY SMEAR    nRBC 0.5 (H) 0.0 - 0.2 %   Neutrophils Relative % 94 %   Neutro Abs 12.5 (H) 1.7 - 7.7 K/uL   Lymphocytes Relative 2 %   Lymphs Abs 0.2 (L) 0.7 - 4.0 K/uL   Monocytes Relative 1 %   Monocytes Absolute 0.1 0 - 1 K/uL   Eosinophils Relative 2 %   Eosinophils Absolute 0.3 0 - 0 K/uL   Basophils Relative 0 %  Basophils Absolute 0.0 0 - 0 K/uL   WBC Morphology MORPHOLOGY UNREMARKABLE    RBC Morphology MORPHOLOGY UNREMARKABLE    Smear Review MORPHOLOGY UNREMARKABLE    Immature Granulocytes 1 %   Abs Immature Granulocytes 0.16 (H) 0.00 - 0.07 K/uL    Comment: Performed at [facility], [address] [place], [place] [address]  Lactic acid, plasma     Status: Abnormal   Collection Time: 09/30/19  7:47 PM  Result Value Ref Range   Lactic Acid, Venous 5.9 (HH) 0.5 - 1.9 mmol/L    Comment: CRITICAL RESULT CALLED TO, READ BACK BY AND VERIFIED WITH JENNIFER ELLINGTON ON 09/30/19 AT 2020 BY JAG Performed at [facility], [address], [place], [place] [address]   Fibrin derivatives D-Dimer     Status: Abnormal   Collection Time: 09/30/19  7:47 PM  Result Value Ref Range   Fibrin derivatives D-dimer ([facility]) >7,500.00 (H) 0.00 - 499.00 ng/mL (FEU)    Comment: (NOTE) <> Exclusion of Venous Thromboembolism (VTE) - OUTPATIENT ONLY   (Emergency Department or [place])    0-499 ng/ml (FEU): With a low to intermediate pretest probability                      for VTE this test result excludes the diagnosis                      of VTE.   >499 ng/ml (FEU) : VTE not excluded; additional work up for VTE is                      required.  <> Testing on Inpatients and Evaluation of Disseminated Intravascular   Coagulation (DIC) Reference Range:   0-499 ng/ml (FEU) Performed at [facility], [address] [place], [place] [address]   Magnesium     Status: None   Collection Time: 09/30/19  7:47 PM  Result Value Ref Range   Magnesium 1.9 1.7 - 2.4 mg/dL    Comment: Performed at [facility], [address] [place], [place] [address]  Phosphorus     Status: Abnormal   Collection Time: 09/30/19  7:47 PM  Result Value Ref Range   Phosphorus 2.3 (L) 2.5 - 4.6 mg/dL    Comment: Performed at [facility], [address] [place], [place] [address]  T4, free     Status: Abnormal   Collection Time: 09/30/19  7:47 PM  Result Value Ref Range   Free T4 1.37 (H) 0.61 - 1.12 ng/dL    Comment: (NOTE) Biotin ingestion may interfere with free T4 tests. If the results are inconsistent with the TSH level, previous test results, or the clinical presentation, then consider biotin interference. If needed, order repeat testing after stopping biotin. Performed at [facility], [address] [place], [place] [address]   TSH     Status: Abnormal   Collection Time: 09/30/19  7:47 PM  Result Value Ref Range   TSH 5.203 (H) 0.350 - 4.500 uIU/mL    Comment: Performed by a 3rd  Generation assay with a functional sensitivity of <=0.01 uIU/mL. Performed at [facility], [place], [place] [address]   Lactic acid, plasma     Status: Abnormal   Collection Time: 09/30/19  9:44 PM  Result Value Ref Range   Lactic Acid, Venous 4.4 (HH) 0.5 - 1.9 mmol/L    Comment: CRITICAL  VALUE NOTED. VALUE IS CONSISTENT WITH PREVIOUSLY REPORTED/CALLED VALUE / JAG Performed at [facility], [address] [place], [place] [address]   Urinalysis, Complete w Microscopic Urine, Clean Catch     Status: Abnormal   Collection Time: 09/30/19  9:44 PM  Result Value Ref Range   Color, Urine AMBER (A) YELLOW    Comment: BIOCHEMICALS MAY BE AFFECTED BY COLOR   APPearance TURBID (A) CLEAR   Specific Gravity, Urine >1.046 (H) 1.005 - 1.030   pH 5.0 5.0 - 8.0   Glucose, UA NEGATIVE NEGATIVE mg/dL   Hgb urine dipstick NEGATIVE NEGATIVE   Bilirubin Urine NEGATIVE NEGATIVE   Ketones, ur NEGATIVE NEGATIVE mg/dL   Protein, ur 100 (A) NEGATIVE mg/dL   Nitrite NEGATIVE NEGATIVE   Leukocytes,Ua NEGATIVE NEGATIVE   RBC / HPF 11-20 0 - 5 RBC/hpf   WBC, UA >50 (H) 0 - 5 WBC/hpf   Bacteria, UA MANY (A) NONE SEEN   Squamous Epithelial / LPF 0-5 0 - 5    Comment: Performed at [facility], [address] [place], [place] [address]  Culture, blood (single)     Status: None (Preliminary result)   Collection Time: 09/30/19  9:44 PM   Specimen: BLOOD  Result Value Ref Range   Specimen Description BLOOD RIGHT AC    Special Requests      BOTTLES DRAWN AEROBIC AND ANAEROBIC Blood Culture adequate volume   Culture      NO GROWTH < 12 HOURS Performed at [facility], [address] [place], [place] [address]    Report Status PENDING   Troponin I (High Sensitivity)     Status: Abnormal   Collection Time: 09/30/19  9:44 PM  Result Value Ref Range   Troponin I (High Sensitivity) 111 (HH) <18 ng/L    Comment: CRITICAL VALUE NOTED. VALUE IS CONSISTENT  WITH PREVIOUSLY REPORTED/CALLED VALUE / JAG (NOTE) Elevated high sensitivity troponin I (hsTnI) values and significant  changes across serial measurements may suggest ACS but many other  chronic and acute conditions are known to elevate hsTnI results.  Refer to the "Links" section for chest pain algorithms and additional  guidance. Performed at [facility], [address] [place], [place] [address]   Lactic acid, plasma     Status: Abnormal   Collection Time: 10/01/19 12:38 AM  Result Value Ref Range   Lactic Acid, Venous 3.1 (HH) 0.5 - 1.9 mmol/L    Comment: CRITICAL VALUE NOTED. VALUE IS CONSISTENT WITH PREVIOUSLY REPORTED/CALLED VALUE / JAG Performed at [facility], [address] [place], [place] [address]   SARS Coronavirus 2 by RT PCR (hospital order, performed in [facility] hospital lab) Nasopharyngeal Nasopharyngeal Swab     Status: None   Collection Time: 10/01/19 12:38 AM   Specimen: Nasopharyngeal Swab  Result Value Ref Range   SARS Coronavirus 2 NEGATIVE NEGATIVE    Comment: (NOTE) SARS-CoV-2 target nucleic acids are NOT DETECTED.  The SARS-CoV-2 RNA is generally detectable in upper and lower respiratory specimens during the acute phase of infection. The lowest concentration of SARS-CoV-2 viral copies this assay can detect is 250 copies / mL. A negative result does not preclude SARS-CoV-2 infection and should not be used as the sole basis for treatment or other patient management decisions.  A negative result may occur with improper specimen collection / handling, submission of specimen other than nasopharyngeal swab, presence of viral mutation(s) within the areas targeted by this assay, and inadequate  number of viral copies (<250 copies / mL). A negative result must be combined with clinical observations, patient history, and epidemiological information.  Fact Sheet for Patients:   StrictlyIdeas.no  Fact Sheet for  Healthcare Providers: BankingDealers.co.za  This test is not yet approved or  cleared by the [place] FDA and has been authorized for detection and/or diagnosis of SARS-CoV-2 by FDA under an Emergency Use Authorization (EUA).  This EUA will remain in effect (meaning this test can be used) for the duration of the COVID-19 declaration under Section 564(b)(1) of the Act, 21 U.S.C. section 360bbb-3(b)(1), unless the authorization is terminated or revoked sooner.  Performed at [facility], [address] [place], [place] [address]   Protime-INR     Status: None   Collection Time: 10/01/19  6:02 AM  Result Value Ref Range   Prothrombin Time 14.1 11.4 - 15.2 seconds   INR 1.1 0.8 - 1.2    Comment: (NOTE) INR goal varies based on device and disease states. Performed at [facility], [address] [place], [place] [address]   Cortisol-am, blood     Status: Abnormal   Collection Time: 10/01/19  6:02 AM  Result Value Ref Range   Cortisol - AM 87.3 (H) 6.7 - 22.6 ug/dL    Comment: RESULTS CONFIRMED BY MANUAL DILUTION Performed at [facility], [address], [place], [place] [address]   Procalcitonin     Status: None   Collection Time: 10/01/19  6:02 AM  Result Value Ref Range   Procalcitonin 7.07 ng/mL    Comment:        Interpretation: PCT > 2 ng/mL: Systemic infection (sepsis) is likely, unless other causes are known. (NOTE)       Sepsis PCT Algorithm           Lower Respiratory Tract                                      Infection PCT Algorithm    ----------------------------     ----------------------------         PCT < 0.25 ng/mL                PCT < 0.10 ng/mL          Strongly encourage             Strongly discourage   discontinuation of antibiotics    initiation of antibiotics    ----------------------------     -----------------------------       PCT 0.25 - 0.50 ng/mL            PCT 0.10 - 0.25 ng/mL                OR       >80% decrease in PCT            Discourage initiation of                                            antibiotics      Encourage discontinuation           of antibiotics    ----------------------------     -----------------------------         PCT >= 0.50 ng/mL  PCT 0.26 - 0.50 ng/mL               AND       <80% decrease in PCT              Encourage initiation of                                             antibiotics       Encourage continuation           of antibiotics    ----------------------------     -----------------------------        PCT >= 0.50 ng/mL                  PCT > 0.50 ng/mL               AND         increase in PCT                  Strongly encourage                                      initiation of antibiotics    Strongly encourage escalation           of antibiotics                                     -----------------------------                                           PCT <= 0.25 ng/mL                                                 OR                                        > 80% decrease in PCT                                      Discontinue / Do not initiate                                             antibiotics  Performed at [facility], [address] [place], [place] [address]   Comprehensive metabolic panel     Status: Abnormal   Collection Time: 10/01/19  6:02 AM  Result Value Ref Range   Sodium 141 135 - 145 mmol/L   Potassium 4.6 3.5 - 5.1 mmol/L   Chloride 108 98 - 111 mmol/L   CO2 21 (L) 22 - 32 mmol/L   Glucose, Bld 115 (H) 70 - 99 mg/dL  Comment: Glucose reference range applies only to samples taken after fasting for at least 8 hours.   BUN 17 8 - 23 mg/dL   Creatinine, Ser 1.56 (H) 0.44 - 1.00 mg/dL   Calcium 8.7 (L) 8.9 - 10.3 mg/dL   Total Protein 5.5 (L) 6.5 - 8.1 g/dL   Albumin 2.4 (L) 3.5 - 5.0 g/dL   AST 415 (H) 15 - 41 U/L   ALT 220 (H) 0 - 44 U/L   Alkaline Phosphatase 615 (H) 38 - 126  U/L   Total Bilirubin 2.1 (H) 0.3 - 1.2 mg/dL   GFR calc non Af Amer 32 (L) >60 mL/min   GFR calc Af Amer 37 (L) >60 mL/min   Anion gap 12 5 - 15    Comment: Performed at [facility], [address] [place], [place] [address]  CBC     Status: Abnormal   Collection Time: 10/01/19  6:02 AM  Result Value Ref Range   WBC 20.8 (H) 4.0 - 10.5 K/uL   RBC 4.11 3.87 - 5.11 MIL/uL   Hemoglobin 12.1 12.0 - 15.0 g/dL   HCT 35.2 (L) 36 - 46 %   MCV 85.6 80.0 - 100.0 fL   MCH 29.4 26.0 - 34.0 pg   MCHC 34.4 30.0 - 36.0 g/dL   RDW 16.1 (H) 11.5 - 15.5 %   Platelets 111 (L) 150 - 400 K/uL    Comment: Immature Platelet Fraction may be clinically indicated, consider ordering this additional test [id number]    nRBC 0.7 (H) 0.0 - 0.2 %    Comment: Performed at [facility], [address] [place], [place] [id number]  Bilirubin, direct     Status: Abnormal   Collection Time: 10/01/19  6:02 AM  Result Value Ref Range   Bilirubin, Direct 0.9 (H) 0.0 - 0.2 mg/dL    Comment: Performed at [facility], [address] [place], [name] [address]  Glucose, capillary     Status: Abnormal   Collection Time: 10/01/19  7:23 AM  Result Value Ref Range   Glucose-Capillary 118 (H) 70 - 99 mg/dL    Comment: Glucose reference range applies only to samples taken after fasting for at least 8 hours.  Glucose, capillary     Status: Abnormal   Collection Time: 10/01/19 11:35 AM  Result Value Ref Range   Glucose-Capillary 130 (H) 70 - 99 mg/dL    Comment: Glucose reference range applies only to samples taken after fasting for at least 8 hours.  Lactate dehydrogenase     Status: Abnormal   Collection Time: 10/01/19 12:33 PM  Result Value Ref Range   LDH 4,058 (H) 98 - 192 U/L    Comment: RESULT CONFIRMED BY MANUAL DILUTION KLW Performed at [facility], [address] [place], [place] [address]   Glucose, capillary     Status: Abnormal   Collection Time: 10/01/19  3:57 PM   Result Value Ref Range   Glucose-Capillary 190 (H) 70 - 99 mg/dL    Comment: Glucose reference range applies only to samples taken after fasting for at least 8 hours.   CT Angio Chest/Abd/Pel for Dissection W and/or Wo Contrast  Result Date: 09/30/2019 CLINICAL DATA:  Abdominal pain, aortic dissection suspected, history of breast cancer EXAM: CT ANGIOGRAPHY CHEST, ABDOMEN AND PELVIS TECHNIQUE: Non-contrast CT of the chest was initially obtained. Multidetector CT imaging through the chest, abdomen and pelvis was performed using the standard protocol during bolus administration of intravenous  contrast. Multiplanar reconstructed images and MIPs were obtained and reviewed to evaluate the vascular anatomy. CONTRAST:  65m OMNIPAQUE IOHEXOL 350 MG/ML SOLN COMPARISON:  CT chest, abdomen and pelvis 09/28/2019 FINDINGS: CTA CHEST FINDINGS Cardiovascular: Initial noncontrast CT of the chest reveals an atherosclerotic thoracic aorta without hyperdense mural thickening or plaque displacement to suggest intramural hematoma. Postcontrast administration there is satisfactory, preferential opacification of the thoracic aorta. The aortic root is suboptimally assessed given cardiac pulsation artifact. Calcifications present on the aortic leaflets. Atherosclerotic plaque within the normal caliber aorta. No acute luminal abnormality of the imaged aorta. No periaortic stranding or hemorrhage. Shared origin of the brachiocephalic and left common carotid arteries. Atherosclerotic plaque in the proximal great vessels without acute luminal abnormality or occlusion. Normal heart size. No pericardial effusion. Three-vessel coronary artery atherosclerosis is noted. Central pulmonary arteries are normal caliber. No acute hyperdense central, lobar or proximal segmental filling defects on this non tailored examination of the pulmonary arteries. Some calcification present in a segmental and subsegmental branches of the medial basal segment  right lower lobe may reflect more chronic pulmonary embolic disease, unchanged from numerous priors. Mediastinum/Nodes: No mediastinal fluid or gas. Stable 7 mm hypoattenuating nodule in the right lobe thyroid gland. Not clinically significant and no further evaluation is warranted. No acute abnormality of the trachea. Small sliding-type hiatal hernia. Esophagus otherwise unremarkable. No worrisome mediastinal, hilar or axillary adenopathy. Lungs/Pleura: Some increasing basilar atelectatic changes most pronounced in the right lower lobe with additional bandlike areas of subsegmental atelectasis and/or scarring. No consolidation, features of edema, pneumothorax, or effusion. Stable appearance of a 3 mm nodule in the periphery of the left lower lobe. No other acute or concerning pulmonary nodules or masses. Musculoskeletal: Multiple ill-defined sclerotic foci present throughout the thoracic spine, bilateral ribs, and sternum. Multilevel degenerative changes are present in the imaged portions of the spine. Additional degenerative changes in the bilateral shoulders. Review of the MIP images confirms the above findings. CTA ABDOMEN AND PELVIS FINDINGS VASCULAR Aorta: Atherosclerotic plaque within the normal caliber aorta. No acute luminal abnormality. No aneurysm or ectasia. No periaortic stranding or hemorrhage. Celiac: Moderate ostial plaque narrowing with a second focus of narrowing in the proximal celiac access demonstrating a hook like configuration on sagittal reformation (9/122) suggesting some compression by the median arcuate ligament with mild poststenotic dilatation of the vessel up to 8 mm in diameter, smoothly tapering to a more normal appearance the otherwise normal branching pattern. SMA: Patent without evidence of aneurysm, dissection, vasculitis or significant stenosis. Renals: Single renal arteries bilaterally. Mild bilateral ostial plaque narrowing. No evidence of aneurysm, dissection, vasculitis or  fibromuscular dysplasia. IMA: Mild-to-moderate ostial plaque narrowing. Otherwise normal distal opacification and branching pattern. Inflow: Calcified noncalcified atheromatous plaque throughout the common, internal external iliac branches. No evidence of aneurysm, dissection or vasculitis. Proximal outflow vessels including the common, superficial and profundus femoral arteries with mild plaque but no significant stenosis, occlusion, dissection or features of vasculitis. Veins: No obvious venous abnormality within the limitations of this arterial phase study. Review of the MIP images confirms the above findings. NON-VASCULAR Hepatobiliary: Innumerable hypoattenuating lesions present throughout the liver including larger confluent lesions spanning segments 4, 5 and 8, largest again measuring up to 8.2 cm in size though poorly characterized compared to the portal venous study is performed recently (09/28/2019, 09/06/2019). Difficult to assess for new or enlarging lesions in this arterial phase exam. Nodular hepatic liver surface contour much of which is attributable to the subcapsular location of many  of these lesions. Persistent gallbladder distension with some increasingly conspicuous gallbladder wall thickening towards the fundus and adjacent pericholecystic inflammatory change (5/118). No visible calcified gallstones. No biliary ductal dilatation. Pancreas: Unremarkable. No pancreatic ductal dilatation or surrounding inflammatory changes. Spleen: Normal in size. No concerning splenic lesions. Adrenals/Urinary Tract: Redemonstration of a 1.2 cm nodule in the body of the right adrenal gland. No discernible left adrenal nodules. Fluid attenuation cyst again seen in the lower pole right kidney measuring up to 3.4 cm. No concerning renal mass, urolithiasis or hydronephrosis. Kidneys enhance symmetrically. Urinary bladder is largely decompressed at the time of exam and therefore poorly evaluated by CT imaging. No gross  bladder abnormality. Stomach/Bowel: Small sliding-type hiatal hernia. Stomach is unremarkable. Few air-filled duodenal diverticula. Few nonspecific fluid-filled though nondilated thickened loops of small bowel. No other conspicuous small bowel segments. Cecum is displaced anterior to the liver. A normal appendix is visualized. No colonic dilatation or wall thickening. Scattered colonic diverticula without focal inflammation to suggest diverticulitis. Lymphatic: No suspicious or enlarged lymph nodes in the included lymphatic chains. Reproductive: Multiple calcified uterine fibroids. Nonspecific endometrial thickening to 7 mm, greater than expected for this advanced age female. No concerning adnexal lesions. Other: Small volume of fluid in the abdomen predominantly within the subphrenic spaces, pericolic gutters and deep pelvis. Musculoskeletal: Background of multilevel degenerative changes in the spine hips and pelvis. Review of the MIP images confirms the above findings. IMPRESSION: Vascular 1. No evidence of acute aortic syndrome. 2.  Aortic Atherosclerosis (ICD10-I70.0). 3. Three-vessel coronary artery disease. 4. Moderate ostial plaque narrowing of the celiac, bilateral renal arteries and IMA. 5. Hook-like configuration of the celiac axis may reflect compression by the median arcuate ligament/arcuate ligament syndrome. Nonvascular 1. Increasingly conspicuous gallbladder wall thickening towards the fundus with some pericholecystic inflammation. Correlate with upper abdominal symptoms and consider right upper quadrant ultrasound for further evaluation. 2. Nonspecific fluid-filled appearance of the distal small bowel some questionable mucosal hyperemia. Correlate for features of enteritis. 3. Innumerable hypoattenuating lesions throughout the liver including larger confluent lesions spanning segments 4, 5 and 8, largest again measuring up to 8.2 cm in size though poorly characterized compared to the portal venous  study is performed /2021, 09/06/2019). Difficult to assess for new or enlarging lesions in this arterial phase exam. 4. Developing ascites throughout the abdomen, could reflect hepatic dysfunction secondary to the extensive involvement of the liver versus reactive free fluid, correlate with serologies. 5. Small right adrenal nodule, concerning for metastasis. 6. Multiple ill-defined sclerotic foci throughout the thoracic spine, bilateral ribs, and sternum, concerning for osseous metastatic disease. 7. Nonspecific endometrial thickening to 7 mm, greater than expected for this advanced age female. Recommend further evaluation with nonemergent pelvic ultrasound. 8. Colonic diverticulosis without evidence of diverticulitis. 9. Small sliding-type hiatal hernia. 10. Aortic Atherosclerosis (ICD10-I70.0). Electronically Signed   By: Lovena Le M.D.   On: 09/30/2019 22:26   US ABDOMEN LIMITED RUQ  Result Date: 09/30/2019 CLINICAL DATA:  Right upper quadrant abdominal pain EXAM: ULTRASOUND ABDOMEN LIMITED RIGHT UPPER QUADRANT COMPARISON:  None. FINDINGS: Gallbladder: The gallbladder wall is thickened measuring approximately 5 mm. The sonographic Percell Miller sign is negative. There is cholelithiasis with gallbladder sludge. Common bile duct: Diameter: 8 mm Liver: Innumerable hepatic masses are noted throughout the liver. Portal vein is patent on color Doppler imaging with normal direction of blood flow towards the liver. Other: None. IMPRESSION: 1. Cholelithiasis without definite sonographic evidence for acute cholecystitis. 2. Mildly thickened gallbladder wall which is nonspecific  but can be seen in patients with underlying hepatocellular disease. 3. Innumerable hepatic masses are again noted, consistent with metastatic disease. Electronically Signed   By: Constance Holster M.D.   On: 09/30/2019 21:23    Assessment:  The patient is a 74 y.o. woman with metastatic breast cancer who was admitted through the emergency room  with weakness, lightheadedness and RUQ abdominal pain. Ultrasound guided liver biopsy on 09/18/2019 confirmed metastatic breast cancer (ER+, PR-, and Her2/neu) -.  Chest, abdomen, and pelvis CT angiogram on 09/30/2019 revealed increasingly conspicuous gallbladder wall thickening with some pericholecystic inflammation, nonspecific fluid-filled distal small bowel, innumerable liver lesions, and developing ascites (? secondary to liver dysfunction), small right adrenal nodule, bone metastasis, and nonspecific endometrial thickening..  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  She has a history of hypercalcemia (11.7 corrected).  She was treated with Zometa on 09/26/2019.   Symptomatically, she is feeling better with improvement of her blood pressure.  She is on broad spectrum antibiotics.  Cultures are negative to date.  Plan:   1.  Metastatic breast cancer  Patient has significant liver involvement of breast cancer based on imaging.  Liver biopsy on 09/18/2019 confirmed metastatic breast cancer.  Imaging reveals no intrinsic hepatic obstruction.  Elevated alkaline phosphatase secondary to liver and bone metastasis.   Consider bone scan to evaluate extent of metastasis.  Given rapidly progressive liver cancer, plan to initiate chemotherapy soon.  Review potential side effects of weekly Taxol. 2.  R/o sepsis  WBC increased from 13,200 to 20,800 on 10/01/2019.   Patient received hydrocortisone 5 hours before labs on 10/01/2019.   Patient has presented twice in the past week with hypotension.  Source of infection unclear.  Possible GI source based on symptomatology.   Paracentesis planned per GI.  She has been treated with stress dose steroids for potential adrenal insufficiency.    AM cortisol on 10/01/2019 87.2 (elevated) likely secondary to steroids given.   Follow culture data.  Consult Dr Delaine Lame, infectious disease.  Thank you for allowing me to participate in  SHAVAWN STOBAUGH 's care.  I will follow her closely with you while hospitalized and after discharge in the outpatient department.   Lequita Asal, MD  10/01/2019, 11:53 PM

## 2019-10-01 NOTE — ED Notes (Signed)
Floor called pt coming

## 2019-10-01 NOTE — Consult Note (Addendum)
[facility] SURGICAL CONSULTATION NOTE (initial) - cpt: 35361   HISTORY OF PRESENT ILLNESS (HPI):  74 y.o. female presented to [facility] ED 09/12 for evaluation of weakness and abdominal pain. Patient's main complaint on presentation was generalized weakness, fatigue, and lightheadedness which had been progressively worsening over the last 5 days. She presented to ED on 09/10 for the same and was admitted for IVF resuscitation and was discharged on 09/11. Newly, she also endorses RUQ abdominal pain since discharge. No fever, chills, nausea, emesis, CP, SOB, or urinary changes. No previous abdominal surgeries. On evaluation in the ED, she was found to be hypotensive to 72/44 which resolved with IVF, slight AKI with sCr - 1.54, AST 358 ALT of 218 and alk phos of 900 with total bili of 2.2, RUQ [place] was concerning for cholelithiasis without evidence of cholecystitis, and CTA Chest/Abdomen/Pelvis showed known extensive liver lesions with some degree of ascites and inflammation, cholelithiasis again seen. She was admitted to medicine service.   This morning, she did have worsening of leukocytosis to 20.8 although I believe this is attributable to hydrocortisone she received on this morning, and she is without fevers. Persistent AKI with sCr - 1.56. LFTs remain elevated AST - 415, ALT - 220 , Alk Phos - 600, T-Bili 2.1. Lactic acidosis improving, now 3.1. She denies abdominal pain.  Surgery is consulted by hospitalist physician Dr. Eugenie Norrie, MD in this context for evaluation and management of cholelithiasis without evidence of cholecystitis in patient with known liver metastasis.  PAST MEDICAL HISTORY (PMH):  Past Medical History:  Diagnosis Date  . Arthritis    bilateral knees  . Cancer [facility]) April 2015   left breast  . Coronary artery disease   . Diabetes mellitus without complication ([place])   . Hyperlipidemia   . Hypertension   . Myocardial infarction ([place]) 11/03/2015   [facility], stent  placed  . Pneumonia      PAST SURGICAL HISTORY [facility]):  Past Surgical History:  Procedure Laterality Date  . BREAST SURGERY Bilateral 11/19/13   bilater mastectomy   . CARDIAC CATHETERIZATION    . CORONARY ANGIOPLASTY    . PORT-A-CATH REMOVAL  2016  . PORTACATH PLACEMENT  06-07-13  . SHOULDER OPEN ROTATOR CUFF REPAIR  2012  . TOTAL KNEE ARTHROPLASTY Left 03/28/2018   Procedure: TOTAL KNEE ARTHROPLASTY-LEFT;  Surgeon: Hessie Knows, MD;  Location: [facility] ORS;  Service: Orthopedics;  Laterality: Left;  . TOTAL KNEE ARTHROPLASTY Right 08/01/2018   Procedure: RIGHT TOTAL KNEE ARTHROPLASTY;  Surgeon: Hessie Knows, MD;  Location: [facility] ORS;  Service: Orthopedics;  Laterality: Right;  . WRIST SURGERY Left 1969   cyst     MEDICATIONS:  Prior to Admission medications   Medication Sig Start Date End Date Taking? Authorizing Provider  alendronate (FOSAMAX) 70 MG tablet 70 mg once a week.    Yes [provider]  Ascorbic Acid (VITAMIN C) 1000 MG tablet Take 1,000 mg by mouth daily.   Yes [provider]  aspirin EC 81 MG tablet Take 81 mg by mouth daily.   Yes [provider]  atorvastatin (LIPITOR) 80 MG tablet Take 80 mg by mouth daily at 6 PM.  02/09/16  Yes [provider]  Biotin 5 MG CAPS Take 5 mg by mouth 2 (two) times a day.   Yes [provider]  Cholecalciferol 125 MCG (5000 UT) TABS Take 1 capsule by mouth 2 (two) times daily.   Yes [provider]  Coenzyme Q10 (COQ10)  100 MG CAPS Take 100 mg by mouth daily.   Yes [provider]  losartan (COZAAR) 50 MG tablet Take 0.5 tablets (25 mg total) by mouth daily. Increase to 1 tab (50 mg ) in 4-5 days if BP >007 systolic 02/08/95  Yes Fritzi Mandes, MD  metoprolol tartrate (LOPRESSOR) 25 MG tablet Take 25 mg by mouth 2 (two) times daily.  11/05/15 07/27/26 Yes [provider]  ondansetron (ZOFRAN) 8 MG tablet Take 1 tablet (8 mg total) by mouth every 8 (eight) hours as needed  (Nausea or vomiting). 09/29/19  Yes Fritzi Mandes, MD  predniSONE (DELTASONE) 10 MG tablet Take 2 tablets (20 mg total) by mouth daily. Patient taking differently: Take 15 mg by mouth daily.  09/29/19  Yes Fritzi Mandes, MD  sulfamethoxazole-trimethoprim (BACTRIM DS) 800-160 MG tablet Take 1 tablet by mouth 3 (three) times a week. 08/02/19  Yes [provider]     ALLERGIES:  Allergies  Allergen Reactions  . Mushroom Extract Complex Hives and Swelling  . Nickel Rash  . Shellfish Allergy Nausea And Vomiting     SOCIAL HISTORY:  Social History   Socioeconomic History  . Marital status: Married    Spouse name: Not on file  . Number of children: Not on file  . Years of education: Not on file  . Highest education level: Not on file  Occupational History  . Not on file  Tobacco Use  . Smoking status: Former Smoker    Packs/day: 1.00    Years: 15.00    Pack years: 15.00    Types: Cigarettes    Quit date: 06/12/1985    Years since quitting: 34.3  . Smokeless tobacco: Never Used  Vaping Use  . Vaping Use: Never used  Substance and Sexual Activity  . Alcohol use: Yes    Alcohol/week: 0.0 standard drinks    Comment: rarely  . Drug use: No  . Sexual activity: Not on file  Other Topics Concern  . Not on file  Social History Narrative  . Not on file   Social Determinants of Health   Financial Resource Strain:   . Difficulty of Paying Living Expenses: Not on file  Food Insecurity:   . Worried About Charity fundraiser in the Last Year: Not on file  . Ran Out of Food in the Last Year: Not on file  Transportation Needs:   . Lack of Transportation (Medical): Not on file  . Lack of Transportation (Non-Medical): Not on file  Physical Activity:   . Days of Exercise per Week: Not on file  . Minutes of Exercise per Session: Not on file  Stress:   . Feeling of Stress : Not on file  Social Connections:   . Frequency of Communication with Friends and Family: Not on file  .  Frequency of Social Gatherings with Friends and Family: Not on file  . Attends Religious Services: Not on file  . Active Member of Clubs or Organizations: Not on file  . Attends Archivist Meetings: Not on file  . Marital Status: Not on file  Intimate Partner Violence:   . Fear of Current or Ex-Partner: Not on file  . Emotionally Abused: Not on file  . Physically Abused: Not on file  . Sexually Abused: Not on file     FAMILY HISTORY:  Family History  Adopted: Yes      REVIEW OF SYSTEMS:  Review of Systems  Constitutional: Positive for malaise/fatigue. Negative for chills  and fever.  HENT: Negative for congestion and sore throat.   Respiratory: Negative for cough and shortness of breath.   Cardiovascular: Negative for chest pain and palpitations.  Gastrointestinal: Positive for abdominal pain (Resolved). Negative for blood in stool, constipation, diarrhea, nausea and vomiting.  Neurological: Positive for dizziness and weakness.  All other systems reviewed and are negative.   VITAL SIGNS:  Temp:  [89.1 F (31.7 C)-98.4 F (36.9 C)] 98.4 F (36.9 C) (09/13 0651) Pulse Rate:  [73-96] 75 (09/13 0651) Resp:  [18-24] 18 (09/13 0651) BP: (72-144)/(44-62) 144/57 (09/13 0651) SpO2:  [91 %-98 %] 96 % (09/13 0651) Weight:  [88.9 kg] 88.9 kg (09/12 1950)     Height: _0  (170.2 cm) Weight: 88.9 kg BMI (Calculated): 30.69   INTAKE/OUTPUT:  09/12 0701 - 09/13 0700 In: 3949.1 [I.V.:401.4; IV Piggyback:3547.7] Out: 600 [Urine:600]  PHYSICAL EXAM:  Physical Exam Vitals and nursing note reviewed. Exam conducted with a chaperone present.  Constitutional:      General: She is not in acute distress.    Appearance: She is well-developed. She is obese. She is not ill-appearing.  HENT:     Head: Normocephalic and atraumatic.  Eyes:     General: No scleral icterus.    Extraocular Movements: Extraocular movements intact.  Cardiovascular:     Rate and Rhythm: Normal rate and  regular rhythm.     Heart sounds: Normal heart sounds. No murmur heard.   Pulmonary:     Effort: Pulmonary effort is normal. No respiratory distress.     Breath sounds: Normal breath sounds.  Abdominal:     General: Abdomen is flat. There is no distension.     Palpations: Abdomen is soft.     Tenderness: There is no abdominal tenderness. There is no guarding or rebound. Negative signs include Murphy's sign.  Genitourinary:    Comments: Deferred Skin:    General: Skin is warm and dry.     Coloration: Skin is not jaundiced or pale.  Neurological:     General: No focal deficit present.     Mental Status: She is alert and oriented to person, place, and time.  Psychiatric:        Mood and Affect: Mood normal.        Behavior: Behavior normal.      Labs:  CBC Latest Ref Rng & Units 10/01/2019 09/30/2019 09/29/2019  WBC 4.0 - 10.5 K/uL 20.8(H) 13.2(H) 10.0  Hemoglobin 12.0 - 15.0 g/dL 12.1 14.0 11.4(L)  Hematocrit 36 - 46 % 35.2(L) 42.0 34.2(L)  Platelets 150 - 400 K/uL 111(L) 135(L) 119(L)   CMP Latest Ref Rng & Units 10/01/2019 09/30/2019 09/29/2019  Glucose 70 - 99 mg/dL 115(H) 113(H) 122(H)  BUN 8 - 23 mg/dL _1 Creatinine 0.44 - 1.00 mg/dL 1.56(H) 1.54(H) 0.97  Sodium 135 - 145 mmol/L 141 141 140  Potassium 3.5 - 5.1 mmol/L 4.6 3.8 3.9  Chloride 98 - 111 mmol/L 108 104 107  CO2 22 - 32 mmol/L 21(L) 21(L) 22  Calcium 8.9 - 10.3 mg/dL 8.7(L) 10.2 9.2  Total Protein 6.5 - 8.1 g/dL 5.5(L) 6.5 -  Total Bilirubin 0.3 - 1.2 mg/dL 2.1(H) 2.2(H) -  Alkaline Phos 38 - 126 U/L 615(H) 900(H) -  AST 15 - 41 U/L 415(H) 358(H) -  ALT 0 - 44 U/L 220(H) 218(H) -     Imaging studies:   CTA Chest/Abdomen/Pelvis (09/30/2019) personally reviewed with fundal thickening of gallbladder with multiple hepatic lesions, some  ascites, but otherwise without evidence of cholecystitis, and radiologist report reviewed:  IMPRESSION: Vascular  1. No evidence of acute aortic syndrome. 2.  Aortic  Atherosclerosis (ICD10-I70.0). 3. Three-vessel coronary artery disease. 4. Moderate ostial plaque narrowing of the celiac, bilateral renal arteries and IMA. 5. Hook-like configuration of the celiac axis may reflect compression by the median arcuate ligament/arcuate ligament syndrome.  Nonvascular  1. Increasingly conspicuous gallbladder wall thickening towards the fundus with some pericholecystic inflammation. Correlate with upper abdominal symptoms and consider right upper quadrant ultrasound for further evaluation. 2. Nonspecific fluid-filled appearance of the distal small bowel some questionable mucosal hyperemia. Correlate for features of enteritis. 3. Innumerable hypoattenuating lesions throughout the liver including larger confluent lesions spanning segments 4, 5 and 8, largest again measuring up to 8.2 cm in size though poorly characterized compared to the portal venous study is performed /2021, 09/06/2019). Difficult to assess for new or enlarging lesions in this arterial phase exam. 4. Developing ascites throughout the abdomen, could reflect hepatic dysfunction secondary to the extensive involvement of the liver versus reactive free fluid, correlate with serologies. 5. Small right adrenal nodule, concerning for metastasis. 6. Multiple ill-defined sclerotic foci throughout the thoracic spine, bilateral ribs, and sternum, concerning for osseous metastatic disease. 7. Nonspecific endometrial thickening to 7 mm, greater than expected for this advanced age female. Recommend further evaluation with nonemergent pelvic ultrasound. 8. Colonic diverticulosis without evidence of diverticulitis. 9. Small sliding-type hiatal hernia. 10. Aortic Atherosclerosis (ICD10-I70.0).  RUQ [place] (09/30/2019) personally reviewed showing cholelithiasis without significant evidence of cholecystitis, and radiologist report reviewed:  IMPRESSION: 1. Cholelithiasis without definite sonographic  evidence for acute cholecystitis. 2. Mildly thickened gallbladder wall which is nonspecific but can be seen in patients with underlying hepatocellular disease. 3. Innumerable hepatic masses are again noted, consistent with metastatic disease.   Assessment/Plan: (ICD-10's: K14.20) 74 y.o. female with leukocytosis attributable IV steroids on 09/13, AKI secondary to dehydration and decreased PO intake, elevated LFTs which is most likely attributable to progression of liver metastasis rather than gallbladder etiology as she is now pain free and otherwise without any other physical exam or radiographic findings to support cholecystitis.   - She does not require any intervention regarding her gallbladder and I believe her symptom etiology more closely attributable to liver metastasis; agree with oncology consultation  - May have a diet as long as no other procedures planned from other consulting services  - Monitor abdominal examination; on-going bowel function  - Abx per medicine service; unsure of any infectious etiology   - Monitor leukocytosis; suspected elevated in setting of IV steroids on 09/13   - trend LFTs  - Further management per primary service      - General surgery will sign off for now; please re-consult with questions/concerns  All of the above findings and recommendations were discussed with the patient, and all of patient questions were answered to her expressed satisfaction.  Thank you for the opportunity to participate in this patient's care.   -- Edison Simon, PA-C [facility] 10/01/2019, 7:42 AM [phone] M-F: 7am - 4pm  I saw and evaluated the patient.  I agree with the above documentation, exam, and plan, which I have edited where appropriate. Fredirick Maudlin  11:13 AM

## 2019-10-01 NOTE — Progress Notes (Signed)
Pharmacy Antibiotic Note  Faith [name] is a 74 y.o. female admitted on 09/30/2019 with sepsis.  Pharmacy has been consulted for vanc/cefepime dosing.  Plan: Will continue vanc 1g IV q24h and will continue cefepime 2g IV q12h per CrCl 30 - 60 ml/min and will continue to monitor and adjust doses per changes in renal function.  Patient appears to be in AKI? W/o h/o CKD, will monitor am labs to assess renal function.  Goal random < 20 mcg/mL. Ke 3.567014 T1/2 ~ 24 hrs  Height: 5\' 7"  (170.2 cm) Weight: 88.9 kg (196 lb) IBW/kg (Calculated) : 61.6  Temp (24hrs), Avg:93.8 F (34.3 C), Min:89.1 F (31.7 C), Max:98.4 F (36.9 C)  Recent Labs  Lab 09/25/19 1428 09/28/19 1051 09/28/19 1436 09/29/19 0451 09/30/19 1947 09/30/19 2144 10/01/19 0038  WBC  --  10.6*  --  10.0 13.2*  --   --   CREATININE 1.09* 1.41*  --  0.97 1.54*  --   --   LATICACIDVEN  --  2.3* 1.8  --  5.9* 4.4* 3.1*    Estimated Creatinine Clearance: 36.1 mL/min (A) (by C-G formula based on SCr of 1.54 mg/dL (H)).    Allergies  Allergen Reactions  . Mushroom Extract Complex Hives and Swelling  . Nickel Rash  . Shellfish Allergy Nausea And Vomiting    Thank you for allowing pharmacy to be a part of this patient's care.  Tobie Lords, PharmD, BCPS Clinical Pharmacist 10/01/2019 3:21 AM

## 2019-10-01 NOTE — ED Notes (Addendum)
Report called  

## 2019-10-01 NOTE — Progress Notes (Signed)
PROGRESS NOTE    Treena Cosman [facility]  RPZ:[id number] DOB: 20-Apr-1945 DOA: 09/30/2019 PCP: Mick Sell, MD    Brief Narrative:  Keliah Harned  is a 74 y.o. female with a known history of coronary artery disease, type diabetes mellitus, hypertension, dyslipidemia, presented to the emergency room with acute onset of generalized weakness and lightheadedness as well as abdominal pain.  He was admitted here on 9/10 and discharged yesterday when she was managed for hypotension and presyncope with acute kidney injury on stage IIIa chronic kidney disease.  She stated that she felt fine when she went home yesterday till this afternoon when she felt weak and dizzy and could not stand or walk.  She admitted to associated abdominal pain epigastric and right upper quadrant area.  She denied any nausea or vomiting or diarrhea.  No fever or chills.  No chest pain or dyspnea or cough or wheezing.  No dysuria, oliguria, urgency or frequency or flank pain.  Upon presentation to the emergency room, hypertension was 89.92F/31.7C, blood pressure 72/44 with a heart rate of 96 respiratory to 24 and pulse 70 was 98% on room air.  With clear bolus of IV normal saline her blood pressure was up to 130/48.  Labs were remarkable for anion gap of 16, magnesium of 1.9 with a potassium of 3.8 BUN of 17 with a creatinine of 1.54 compared to 21 and 0.97 on 9/11, high-sensitivity troponin I 160 and later 111, AST 358 ALT of 218 and alk phos of 900 with total bili of 2.2 albumin of 3 and total protein of 6.5.  The patient's alk phos was 669 on 9/10 with total bili of 2.3, AST is 225 and ALT 177.  Lactic acid came back 5.9 and later down to 4.4 from 1.8 on 9/10.  CBC showed leukocytosis of 13.2 with neutrophilia as well as thrombocytopenia of 135.  WBCs were 10 on 9/11.  At 500 mL D-dimer was more than 7500, Tylenol level 10 and salicylate less than 7.  TSH was 4.2 and free T4 1.37 both slightly elevated.  Blood cultures were drawn. EKG showed  sinus rhythm with rate of 98 with occasional PVCs and possible left atrial enlargement.  Right upper quadrant ultrasound revealed the following: 1. Cholelithiasis without definite sonographic evidence for acute cholecystitis. 2. Mildly thickened gallbladder wall which is nonspecific but can be seen in patients with underlying hepatocellular disease. 3. Innumerable hepatic masses are again noted, consistent with metastatic disease.  The patient was given IV cefepime and Flagyl, IV Zofran and 3 L bolus of IV normal saline.  She will be admitted to a progressive unit bed for further evaluation and management.   Assessment & Plan:   Active Problems:   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 female, estrogen receptor positive (HCC)   Elevated LFTs   Bone metastasis (HCC)   Liver metastasis (HCC)   HLD (hyperlipidemia)   Acute renal failure superimposed on stage 3a chronic kidney disease (HCC)   Sepsis (HCC)   Leukocytosis   1. SIRS.  No clear source of infection at this time.  She did present with hypotension, elevated lactate, hypothermia and tachypnea.  No evidence of pneumonia on chest imaging.  She is on broad-spectrum antibiotics.  Follow-up blood cultures.  Overall hemodynamics have improved with IV fluids.  Initial plans were for paracentesis to rule out SBP, but she does not have significant ascites to warrant paracentesis. 2. Elevated liver enzymes.  Initially, based on imaging there  was concern for cholecystitis.  Seen by general surgery high-dose felt unlikely that patient had cholecystitis.  She is also being seen by GI and it is thought that her elevated liver enzymes likely related to underlying cancer.  If they do not improve, can potentially consider MRCP 3. Acute kidney injury on chronic kidney disease stage III.  Likely related to hypotension.  Continue IV fluids and monitor renal function. 4. Left breast cancer with metastases to liver and bone.  Oncology  consulted. 5. Hyperlipidemia.  Holding statin in light of elevated liver enzymes 6. Leukocytosis.  Possibly reactive to #1.  She also received a dose of intravenous steroids yesterday while she was hypotensive.  She is currently afebrile.  Continue to monitor. 7. History of hypertension.  Antihypertensives currently on hold in light of recent hypotension.   DVT prophylaxis: enoxaparin (LOVENOX) injection 40 mg Start: 10/01/19 0000  Code Status: full code Family Communication: discussed with patient Disposition Plan: Status is: Inpatient  Remains inpatient appropriate because:IV treatments appropriate due to intensity of illness or inability to take PO   Dispo: The patient is from: Home              Anticipated d/c is to: Home              Anticipated d/c date is: 2 days              Patient currently is not medically stable to d/c.         Consultants:   Gen surgery  GI  Oncology  Procedures:     Antimicrobials:   Cefepime 9/12>  Flagyl 9/12>  Vancomycin 9/12> 9/13    Subjective: No nausea or vomiting. Abdominal pain is better. She was having pain in periumbilical/epigastric area  Objective: Vitals:   10/01/19 1020 10/01/19 1137 10/01/19 1557 10/01/19 1859  BP: (!) 145/59 (!) 142/54 (!) 148/55 (!) 151/71  Pulse: 84 89 87 97  Resp: _0 Temp: 98.4 F (36.9 C) 98 F (36.7 C) 98.1 F (36.7 C) 98.4 F (36.9 C)  TempSrc: Oral Oral Oral Oral  SpO2: 96% 96% 96% 94%  Weight:      Height:        Intake/Output Summary (Last 24 hours) at 10/01/2019 1951 Last data filed at 10/01/2019 1744 Gross per 24 hour  Intake 4669.14 ml  Output 1900 ml  Net 2769.14 ml   Filed Weights   09/30/19 1932 09/30/19 1950  Weight: 88.9 kg 88.9 kg    Examination:  General exam: Appears calm and comfortable  Respiratory system: Clear to auscultation. Respiratory effort normal. Cardiovascular system: S1 & S2 heard, RRR. No JVD, murmurs, rubs, gallops or clicks.  No pedal edema. Gastrointestinal system: Abdomen is distended, soft and tender in periumbilical area. No organomegaly or masses felt. Normal bowel sounds heard. Central nervous system: Alert and oriented. No focal neurological deficits. Extremities: Symmetric 5 x 5 power. Skin: No rashes, lesions or ulcers Psychiatry: Judgement and insight appear normal. Mood & affect appropriate.     Data Reviewed: I have personally reviewed following labs and imaging studies  CBC: Recent Labs  Lab 09/28/19 1051 09/29/19 0451 09/30/19 1947 10/01/19 0602  WBC 10.6* 10.0 13.2* 20.8*  NEUTROABS 9.0*  --  12.5*  --   HGB 13.2 11.4* 14.0 12.1  HCT 39.1 34.2* 42.0 35.2*  MCV 85.6 87.7 87.1 85.6  PLT 151 119* 135* 711*   Basic Metabolic Panel: Recent Labs  Lab 09/25/19 1428  09/28/19 1051 09/29/19 0451 09/30/19 1947 10/01/19 0602  NA 139 141 140 141 141  K 4.3 4.1 3.9 3.8 4.6  CL 101 104 107 104 108  CO2 _0 21* 21*  GLUCOSE 108* 126* 122* 113* 115*  BUN 24* _1 CREATININE 1.09* 1.41* 0.97 1.54* 1.56*  CALCIUM 11.1* 9.9 9.2 10.2 8.7*  MG  --   --   --  1.9  --   PHOS  --   --   --  2.3*  --    GFR: Estimated Creatinine Clearance: 35.7 mL/min (A) (by C-G formula based on SCr of 1.56 mg/dL (H)). Liver Function Tests: Recent Labs  Lab 09/25/19 1428 09/28/19 1051 09/30/19 1947 10/01/19 0602  AST 239* 225* 358* 415*  ALT 226* 177* 218* 220*  ALKPHOS 669* 669* 900* 615*  BILITOT 1.5* 2.3* 2.2* 2.1*  PROT 7.1 6.3* 6.5 5.5*  ALBUMIN 3.3* 3.1* 3.0* 2.4*   Recent Labs  Lab 09/30/19 1947  LIPASE 73*   No results for input(s): AMMONIA in the last 168 hours. Coagulation Profile: Recent Labs  Lab 09/25/19 1428 10/01/19 0602  INR 1.0 1.1   Cardiac Enzymes: No results for input(s): CKTOTAL, CKMB, CKMBINDEX, TROPONINI in the last 168 hours. BNP (last 3 results) No results for input(s): PROBNP in the last 8760 hours. HbA1C: No results for input(s): HGBA1C in the last  72 hours. CBG: Recent Labs  Lab 09/28/19 2203 09/29/19 0855 10/01/19 0723 10/01/19 1135 10/01/19 1557  GLUCAP 171* 100* 118* 130* 190*   Lipid Profile: No results for input(s): CHOL, HDL, LDLCALC, TRIG, CHOLHDL, LDLDIRECT in the last 72 hours. Thyroid Function Tests: Recent Labs    09/30/19 1947  TSH 5.203*  FREET4 1.37*   Anemia Panel: No results for input(s): VITAMINB12, FOLATE, FERRITIN, TIBC, IRON, RETICCTPCT in the last 72 hours. Sepsis Labs: Recent Labs  Lab 09/28/19 1436 09/30/19 1947 09/30/19 2144 10/01/19 0038 10/01/19 0602  PROCALCITON  --   --   --   --  7.07  LATICACIDVEN 1.8 5.9* 4.4* 3.1*  --     Recent Results (from the past 240 hour(s))  SARS CORONAVIRUS 2 (TAT 6-24 HRS) Nasopharyngeal Nasopharyngeal Swab     Status: None   Collection Time: 09/26/19 11:21 AM   Specimen: Nasopharyngeal Swab  Result Value Ref Range Status   SARS Coronavirus 2 NEGATIVE NEGATIVE Final    Comment: (NOTE) SARS-CoV-2 target nucleic acids are NOT DETECTED.  The SARS-CoV-2 RNA is generally detectable in upper and lower respiratory specimens during the acute phase of infection. Negative results do not preclude SARS-CoV-2 infection, do not rule out co-infections with other pathogens, and should not be used as the sole basis for treatment or other patient management decisions. Negative results must be combined with clinical observations, patient history, and epidemiological information. The expected result is Negative.  Fact Sheet for Patients: SugarRoll.be  Fact Sheet for Healthcare Providers: https://www.woods-mathews.com/  This test is not yet approved or cleared by the [place] FDA and  has been authorized for detection and/or diagnosis of SARS-CoV-2 by FDA under an Emergency Use Authorization (EUA). This EUA will remain  in effect (meaning this test can be used) for the duration of the COVID-19 declaration under Se  ction 564(b)(1) of the Act, 21 U.S.C. section 360bbb-3(b)(1), unless the authorization is terminated or revoked sooner.  Performed at [facility], [place] [address], [place], [place] [address]   SARS Coronavirus 2 by RT PCR (  hospital order, performed in [facility] hospital lab) Nasopharyngeal Nasopharyngeal Swab     Status: None   Collection Time: 09/28/19 10:51 AM   Specimen: Nasopharyngeal Swab  Result Value Ref Range Status   SARS Coronavirus 2 NEGATIVE NEGATIVE Final    Comment: (NOTE) SARS-CoV-2 target nucleic acids are NOT DETECTED.  The SARS-CoV-2 RNA is generally detectable in upper and lower respiratory specimens during the acute phase of infection. The lowest concentration of SARS-CoV-2 viral copies this assay can detect is 250 copies / mL. A negative result does not preclude SARS-CoV-2 infection and should not be used as the sole basis for treatment or other patient management decisions.  A negative result may occur with improper specimen collection / handling, submission of specimen other than nasopharyngeal swab, presence of viral mutation(s) within the areas targeted by this assay, and inadequate number of viral copies (<250 copies / mL). A negative result must be combined with clinical observations, patient history, and epidemiological information.  Fact Sheet for Patients:   StrictlyIdeas.no  Fact Sheet for Healthcare Providers: BankingDealers.co.za  This test is not yet approved or  cleared by the [place] FDA and has been authorized for detection and/or diagnosis of SARS-CoV-2 by FDA under an Emergency Use Authorization (EUA).  This EUA will remain in effect (meaning this test can be used) for the duration of the COVID-19 declaration under Section 564(b)(1) of the Act, 21 U.S.C. section 360bbb-3(b)(1), unless the authorization is terminated or revoked sooner.  Performed at [facility], [address] [place], [place] [address]   Blood culture (single)     Status: None (Preliminary result)   Collection Time: 09/30/19  7:47 PM   Specimen: BLOOD  Result Value Ref Range Status   Specimen Description BLOOD RIGHT [facility]  Final   Special Requests   Final    BOTTLES DRAWN AEROBIC AND ANAEROBIC Blood Culture results may not be optimal due to an excessive volume of blood received in culture bottles   Culture   Final    NO GROWTH < 12 HOURS Performed at [facility], [address], [place], [place] [address]    Report Status PENDING  Incomplete  Culture, blood (single)     Status: None (Preliminary result)   Collection Time: 09/30/19  9:44 PM   Specimen: BLOOD  Result Value Ref Range Status   Specimen Description BLOOD RIGHT [facility]  Final   Special Requests   Final    BOTTLES DRAWN AEROBIC AND ANAEROBIC Blood Culture adequate volume   Culture   Final    NO GROWTH < 12 HOURS Performed at [facility], [address] [place],  [address]    Report Status PENDING  Incomplete  SARS Coronavirus 2 by RT PCR (hospital order, performed in [place] hospital lab) Nasopharyngeal Nasopharyngeal Swab     Status: None   Collection Time: 10/01/19 12:38 AM   Specimen: Nasopharyngeal Swab  Result Value Ref Range Status   SARS Coronavirus 2 NEGATIVE NEGATIVE Final    Comment: (NOTE) SARS-CoV-2 target nucleic acids are NOT DETECTED.  The SARS-CoV-2 RNA is generally detectable in upper and lower respiratory specimens during the acute phase of infection. The lowest concentration of SARS-CoV-2 viral copies this assay can detect is 250 copies / mL. A negative result does not preclude SARS-CoV-2 infection and should not be used as the sole basis for treatment or other patient management decisions.  A negative result may occur with improper specimen collection / handling,  submission of specimen other than nasopharyngeal swab, presence of viral mutation(s) within  the areas targeted by this assay, and inadequate number of viral copies (<250 copies / mL). A negative result must be combined with clinical observations, patient history, and epidemiological information.  Fact Sheet for Patients:   StrictlyIdeas.no  Fact Sheet for Healthcare Providers: BankingDealers.co.za  This test is not yet approved or  cleared by the [place] FDA and has been authorized for detection and/or diagnosis of SARS-CoV-2 by FDA under an Emergency Use Authorization (EUA).  This EUA will remain in effect (meaning this test can be used) for the duration of the COVID-19 declaration under Section 564(b)(1) of the Act, 21 U.S.C. section 360bbb-3(b)(1), unless the authorization is terminated or revoked sooner.  Performed at [facility], [address] [place], [place] [address]          Radiology Studies: CT Angio Chest/Abd/Pel for Dissection W and/or Wo Contrast  Result Date: 09/30/2019 CLINICAL DATA:  Abdominal pain, aortic dissection suspected, history of breast cancer EXAM: CT ANGIOGRAPHY CHEST, ABDOMEN AND PELVIS TECHNIQUE: Non-contrast CT of the chest was initially obtained. Multidetector CT imaging through the chest, abdomen and pelvis was performed using the standard protocol during bolus administration of intravenous contrast. Multiplanar reconstructed images and MIPs were obtained and reviewed to evaluate the vascular anatomy. CONTRAST:  71m OMNIPAQUE IOHEXOL 350 MG/ML SOLN COMPARISON:  CT chest, abdomen and pelvis 09/28/2019 FINDINGS: CTA CHEST FINDINGS Cardiovascular: Initial noncontrast CT of the chest reveals an atherosclerotic thoracic aorta without hyperdense mural thickening or plaque displacement to suggest intramural hematoma. Postcontrast administration there is satisfactory, preferential opacification of the thoracic aorta. The aortic root is suboptimally assessed given cardiac pulsation  artifact. Calcifications present on the aortic leaflets. Atherosclerotic plaque within the normal caliber aorta. No acute luminal abnormality of the imaged aorta. No periaortic stranding or hemorrhage. Shared origin of the brachiocephalic and left common carotid arteries. Atherosclerotic plaque in the proximal great vessels without acute luminal abnormality or occlusion. Normal heart size. No pericardial effusion. Three-vessel coronary artery atherosclerosis is noted. Central pulmonary arteries are normal caliber. No acute hyperdense central, lobar or proximal segmental filling defects on this non tailored examination of the pulmonary arteries. Some calcification present in a segmental and subsegmental branches of the medial basal segment right lower lobe may reflect more chronic pulmonary embolic disease, unchanged from numerous priors. Mediastinum/Nodes: No mediastinal fluid or gas. Stable 7 mm hypoattenuating nodule in the right lobe thyroid gland. Not clinically significant and no further evaluation is warranted. No acute abnormality of the trachea. Small sliding-type hiatal hernia. Esophagus otherwise unremarkable. No worrisome mediastinal, hilar or axillary adenopathy. Lungs/Pleura: Some increasing basilar atelectatic changes most pronounced in the right lower lobe with additional bandlike areas of subsegmental atelectasis and/or scarring. No consolidation, features of edema, pneumothorax, or effusion. Stable appearance of a 3 mm nodule in the periphery of the left lower lobe. No other acute or concerning pulmonary nodules or masses. Musculoskeletal: Multiple ill-defined sclerotic foci present throughout the thoracic spine, bilateral ribs, and sternum. Multilevel degenerative changes are present in the imaged portions of the spine. Additional degenerative changes in the bilateral shoulders. Review of the MIP images confirms the above findings. CTA ABDOMEN AND PELVIS FINDINGS VASCULAR Aorta: Atherosclerotic  plaque within the normal caliber aorta. No acute luminal abnormality. No aneurysm or ectasia. No periaortic stranding or hemorrhage. Celiac: Moderate ostial plaque narrowing with a second focus of narrowing in the proximal celiac access demonstrating a hook like configuration on  sagittal reformation (9/122) suggesting some compression by the median arcuate ligament with mild poststenotic dilatation of the vessel up to 8 mm in diameter, smoothly tapering to a more normal appearance the otherwise normal branching pattern. SMA: Patent without evidence of aneurysm, dissection, vasculitis or significant stenosis. Renals: Single renal arteries bilaterally. Mild bilateral ostial plaque narrowing. No evidence of aneurysm, dissection, vasculitis or fibromuscular dysplasia. IMA: Mild-to-moderate ostial plaque narrowing. Otherwise normal distal opacification and branching pattern. Inflow: Calcified noncalcified atheromatous plaque throughout the common, internal external iliac branches. No evidence of aneurysm, dissection or vasculitis. Proximal outflow vessels including the common, superficial and profundus femoral arteries with mild plaque but no significant stenosis, occlusion, dissection or features of vasculitis. Veins: No obvious venous abnormality within the limitations of this arterial phase study. Review of the MIP images confirms the above findings. NON-VASCULAR Hepatobiliary: Innumerable hypoattenuating lesions present throughout the liver including larger confluent lesions spanning segments 4, 5 and 8, largest again measuring up to 8.2 cm in size though poorly characterized compared to the portal venous study is performed recently (09/28/2019, 09/06/2019). Difficult to assess for new or enlarging lesions in this arterial phase exam. Nodular hepatic liver surface contour much of which is attributable to the subcapsular location of many of these lesions. Persistent gallbladder distension with some increasingly  conspicuous gallbladder wall thickening towards the fundus and adjacent pericholecystic inflammatory change (5/118). No visible calcified gallstones. No biliary ductal dilatation. Pancreas: Unremarkable. No pancreatic ductal dilatation or surrounding inflammatory changes. Spleen: Normal in size. No concerning splenic lesions. Adrenals/Urinary Tract: Redemonstration of a 1.2 cm nodule in the body of the right adrenal gland. No discernible left adrenal nodules. Fluid attenuation cyst again seen in the lower pole right kidney measuring up to 3.4 cm. No concerning renal mass, urolithiasis or hydronephrosis. Kidneys enhance symmetrically. Urinary bladder is largely decompressed at the time of exam and therefore poorly evaluated by CT imaging. No gross bladder abnormality. Stomach/Bowel: Small sliding-type hiatal hernia. Stomach is unremarkable. Few air-filled duodenal diverticula. Few nonspecific fluid-filled though nondilated thickened loops of small bowel. No other conspicuous small bowel segments. Cecum is displaced anterior to the liver. A normal appendix is visualized. No colonic dilatation or wall thickening. Scattered colonic diverticula without focal inflammation to suggest diverticulitis. Lymphatic: No suspicious or enlarged lymph nodes in the included lymphatic chains. Reproductive: Multiple calcified uterine fibroids. Nonspecific endometrial thickening to 7 mm, greater than expected for this advanced age female. No concerning adnexal lesions. Other: Small volume of fluid in the abdomen predominantly within the subphrenic spaces, pericolic gutters and deep pelvis. Musculoskeletal: Background of multilevel degenerative changes in the spine hips and pelvis. Review of the MIP images confirms the above findings. IMPRESSION: Vascular 1. No evidence of acute aortic syndrome. 2.  Aortic Atherosclerosis (ICD10-I70.0). 3. Three-vessel coronary artery disease. 4. Moderate ostial plaque narrowing of the celiac, bilateral  renal arteries and IMA. 5. Hook-like configuration of the celiac axis may reflect compression by the median arcuate ligament/arcuate ligament syndrome. Nonvascular 1. Increasingly conspicuous gallbladder wall thickening towards the fundus with some pericholecystic inflammation. Correlate with upper abdominal symptoms and consider right upper quadrant ultrasound for further evaluation. 2. Nonspecific fluid-filled appearance of the distal small bowel some questionable mucosal hyperemia. Correlate for features of enteritis. 3. Innumerable hypoattenuating lesions throughout the liver including larger confluent lesions spanning segments 4, 5 and 8, largest again measuring up to 8.2 cm in size though poorly characterized compared to the portal venous study is performed /2021, 09/06/2019). Difficult to assess for new  or enlarging lesions in this arterial phase exam. 4. Developing ascites throughout the abdomen, could reflect hepatic dysfunction secondary to the extensive involvement of the liver versus reactive free fluid, correlate with serologies. 5. Small right adrenal nodule, concerning for metastasis. 6. Multiple ill-defined sclerotic foci throughout the thoracic spine, bilateral ribs, and sternum, concerning for osseous metastatic disease. 7. Nonspecific endometrial thickening to 7 mm, greater than expected for this advanced age female. Recommend further evaluation with nonemergent pelvic ultrasound. 8. Colonic diverticulosis without evidence of diverticulitis. 9. Small sliding-type hiatal hernia. 10. Aortic Atherosclerosis (ICD10-I70.0). Electronically Signed   By: Lovena Le M.D.   On: 09/30/2019 22:26   US ABDOMEN LIMITED RUQ  Result Date: 09/30/2019 CLINICAL DATA:  Right upper quadrant abdominal pain EXAM: ULTRASOUND ABDOMEN LIMITED RIGHT UPPER QUADRANT COMPARISON:  None. FINDINGS: Gallbladder: The gallbladder wall is thickened measuring approximately 5 mm. The sonographic Percell Miller sign is negative. There is  cholelithiasis with gallbladder sludge. Common bile duct: Diameter: 8 mm Liver: Innumerable hepatic masses are noted throughout the liver. Portal vein is patent on color Doppler imaging with normal direction of blood flow towards the liver. Other: None. IMPRESSION: 1. Cholelithiasis without definite sonographic evidence for acute cholecystitis. 2. Mildly thickened gallbladder wall which is nonspecific but can be seen in patients with underlying hepatocellular disease. 3. Innumerable hepatic masses are again noted, consistent with metastatic disease. Electronically Signed   By: Constance Holster M.D.   On: 09/30/2019 21:23        Scheduled Meds: . vitamin C  1,000 mg Oral Daily  . cholecalciferol  5,000 Units Oral BID  . enoxaparin (LOVENOX) injection  40 mg Subcutaneous Q24H  . phosphorus  500 mg Oral Q4H   Continuous Infusions: . sodium chloride Stopped (10/01/19 0658)  . ceFEPime (MAXIPIME) IV 2 g (10/01/19 0954)  . metronidazole 500 mg (10/01/19 1315)     LOS: 1 day    Time spent: 31mns    JKathie Dike MD [facility]   If 7PM-7AM, please contact night-coverage [email or web address]  10/01/2019, 7:51 PM

## 2019-10-01 NOTE — Progress Notes (Signed)
CODE SEPSIS - PHARMACY COMMUNICATION  **Broad Spectrum Antibiotics should be administered within 1 hour of Sepsis diagnosis**  Time Code Sepsis Called/Page Received: 2350  Antibiotics Ordered: vanc/cefepime  Time of 1st antibiotic administration: 2135  Additional action taken by pharmacy:   If necessary, Name of Provider/Nurse Contacted:     Tobie Lords ,PharmD Clinical Pharmacist  10/01/2019  12:34 AM

## 2019-10-01 NOTE — Progress Notes (Signed)
PHARMACY -  BRIEF ANTIBIOTIC NOTE   Pharmacy has received consult(s) for vancomycin from an ED provider.  The patient's profile has been reviewed for ht/wt/allergies/indication/available labs.    One time order(s) placed for vanc 1.75g IV load  Further antibiotics/pharmacy consults should be ordered by admitting physician if indicated.                       Thank you,  Tobie Lords, PharmD, BCPS Clinical Pharmacist 10/01/2019  3:12 AM

## 2019-10-01 NOTE — Consult Note (Signed)
Jonathon Bellows , MD [address], [place], [place], [place], [address] [address] [place], [place], [place], [address] Phone: [phone]  Fax: [phone]  Consultation  Referring Provider:     Dr  Mike Gip Primary Care Physician:  Leonel Ramsay, MD Primary Gastroenterologist:  Dr. Gershon Cull @ [facility]          Reason for Consultation:     Abnormal LFTs  Date of Admission:  09/30/2019 Date of Consultation:  10/01/2019         HPI:   Faith [name] is a 74 y.o. female seen previously by [facility] for abnormal LFTs in July 2021.  She has a history of metastatic breast cancer and follows with Dr. Mike Gip.  Liver biopsy on 09/18/2019 shows metastatic mammary carcinoma  08/30/2019: Ceruloplasmin normal,Hepatitis B surface antigen negative, ferritin over thousand 500, TIBC normal, hepatitis C virus antibody negative, hepatitis B core total antibody negative.  He was admitted on 10/01/2019 for weakness and abdominal pain found to be hypotensive..  09/28/2019 admitted for AKI, dehydration, syncopal.  Right upper quadrant ultrasound demonstrated thickened gallbladder which is nonspecific.  Cholelithiasis without definite ultrasound evidence for acute cholecystitis.  Commenced on antibiotics and fluids.  09/30/2019: Right upper quadrant ultrasound shows cholelithiasis with no acute evidence of cholecystitis innumerable hepatic masses consistent with metastatic disease. 09/30/2019 CT angio chest abdomen pelvis for dissection: Shows thickened gallbladder wall with some pericholecystic inflammation.  Nonspecific fluid-filled appearance with distal small bowel.  Innumerable hypoattenuating lesions throughout the liver developing ascites throughout the abdomen could reflect hepatic dysfunction.  Multiple ill-defined sclerotic foci throughout the thoracic spine, bilateral ribs and sternum concerning for metastatic disease.  Seen by general surgery and felt no evidence of acute cholecystitis and felt abnormal  LFT secondary to hepatic metastatis    Labs 2021: Hemoglobin 12.1 g with a white cell count of 20, total bilirubin 2.1, alkaline phosphatase 615, AST 415 ALT 220.  INR 1.1.  Recently she denies any abdominal pain.  She had some abdominal pain on Sunday.  Denies any fever.  No other complaints presently. Past Medical History:  Diagnosis Date  . Arthritis    bilateral knees  . Cancer [facility]) April 2015   left breast  . Coronary artery disease   . Diabetes mellitus without complication ([place])   . Hyperlipidemia   . Hypertension   . Myocardial infarction ([place]) 11/03/2015   [facility], stent placed  . Pneumonia     Past Surgical History:  Procedure Laterality Date  . BREAST SURGERY Bilateral 11/19/13   bilater mastectomy   . CARDIAC CATHETERIZATION    . CORONARY ANGIOPLASTY    . PORT-A-CATH REMOVAL  2016  . PORTACATH PLACEMENT  06-07-13  . SHOULDER OPEN ROTATOR CUFF REPAIR  2012  . TOTAL KNEE ARTHROPLASTY Left 03/28/2018   Procedure: TOTAL KNEE ARTHROPLASTY-LEFT;  Surgeon: Hessie Knows, MD;  Location: [facility] ORS;  Service: Orthopedics;  Laterality: Left;  . TOTAL KNEE ARTHROPLASTY Right 08/01/2018   Procedure: RIGHT TOTAL KNEE ARTHROPLASTY;  Surgeon: Hessie Knows, MD;  Location: [facility] ORS;  Service: Orthopedics;  Laterality: Right;  . WRIST SURGERY Left 1969   cyst    Prior to Admission medications   Medication Sig Start Date End Date Taking? Authorizing Provider  alendronate (FOSAMAX) 70 MG tablet 70 mg once a week.    Yes [provider]  Ascorbic Acid (VITAMIN C) 1000 MG tablet Take 1,000 mg by mouth daily.   Yes [provider]  aspirin EC  81 MG tablet Take 81 mg by mouth daily.   Yes [provider]  atorvastatin (LIPITOR) 80 MG tablet Take 80 mg by mouth daily at 6 PM.  02/09/16  Yes [provider]  Biotin 5 MG CAPS Take 5 mg by mouth 2 (two) times a day.   Yes [provider]  Cholecalciferol 125 MCG (5000 UT) TABS Take 1 capsule by mouth  2 (two) times daily.   Yes [provider]  Coenzyme Q10 (COQ10) 100 MG CAPS Take 100 mg by mouth daily.   Yes [provider]  losartan (COZAAR) 50 MG tablet Take 0.5 tablets (25 mg total) by mouth daily. Increase to 1 tab (50 mg ) in 4-5 days if BP >382 systolic 05/22/37  Yes Fritzi Mandes, MD  metoprolol tartrate (LOPRESSOR) 25 MG tablet Take 25 mg by mouth 2 (two) times daily.  11/05/15 07/27/26 Yes [provider]  ondansetron (ZOFRAN) 8 MG tablet Take 1 tablet (8 mg total) by mouth every 8 (eight) hours as needed (Nausea or vomiting). 09/29/19  Yes Fritzi Mandes, MD  predniSONE (DELTASONE) 10 MG tablet Take 2 tablets (20 mg total) by mouth daily. Patient taking differently: Take 15 mg by mouth daily.  09/29/19  Yes Fritzi Mandes, MD  sulfamethoxazole-trimethoprim (BACTRIM DS) 800-160 MG tablet Take 1 tablet by mouth 3 (three) times a week. 08/02/19  Yes [provider]    Family History  Adopted: Yes     Social History   Tobacco Use  . Smoking status: Former Smoker    Packs/day: 1.00    Years: 15.00    Pack years: 15.00    Types: Cigarettes    Quit date: 06/12/1985    Years since quitting: 34.3  . Smokeless tobacco: Never Used  Vaping Use  . Vaping Use: Never used  Substance Use Topics  . Alcohol use: Yes    Alcohol/week: 0.0 standard drinks    Comment: rarely  . Drug use: No    Allergies as of 09/30/2019 - Review Complete 09/30/2019  Allergen Reaction Noted  . Mushroom extract complex Hives and Swelling 08/06/2014  . Nickel Rash 03/13/2018  . Shellfish allergy Nausea And Vomiting 06/04/2013    Review of Systems:    All systems reviewed and negative except where noted in HPI.   Physical Exam:  Vital signs in last 24 hours: Temp:  [89.1 F (31.7 C)-98.4 F (36.9 C)] 98 F (36.7 C) (09/13 1137) Pulse Rate:  [73-96] 89 (09/13 1137) Resp:  [18-24] 19 (09/13 1137) BP: (72-145)/(44-62) 142/54 (09/13 1137) SpO2:  [91 %-98 %] 96 % (09/13  1137) Weight:  [88.9 kg] 88.9 kg (09/12 1950)   General:   Pleasant, cooperative in NAD Head:  Normocephalic and atraumatic. Eyes:   No icterus.   Conjunctiva pink. PERRLA. Ears:  Normal auditory acuity. Neck:  Supple; no masses or thyroidomegaly Lungs: Respirations even and unlabored. Lungs clear to auscultation bilaterally.   No wheezes, crackles, or rhonchi.  Heart:  Regular rate and rhythm;  Without murmur, clicks, rubs or gallops Abdomen:  Soft, nondistended, nontender. Normal bowel sounds. No appreciable masses or hepatomegaly.  No rebound or guarding.  Neurologic:  Alert and oriented x3;  grossly normal neurologically. Skin:  Intact without significant lesions or rashes. Cervical Nodes:  No significant cervical adenopathy. Psych:  Alert and cooperative. Normal affect.  LAB RESULTS: Recent Labs    09/29/19 0451 09/30/19 1947 10/01/19 0602  WBC 10.0 13.2* 20.8*  HGB 11.4* 14.0 12.1  HCT 34.2* 42.0 35.2*  PLT 119* 135* 111*   BMET Recent Labs    09/29/19 0451 09/30/19 1947 10/01/19 0602  NA 140 141 141  K 3.9 3.8 4.6  CL 107 104 108  CO2 22 21* 21*  GLUCOSE 122* 113* 115*  BUN 21 17 17   CREATININE 0.97 1.54* 1.56*  CALCIUM 9.2 10.2 8.7*   LFT Recent Labs    10/01/19 0602  PROT 5.5*  ALBUMIN 2.4*  AST 415*  ALT 220*  ALKPHOS 615*  BILITOT 2.1*   PT/INR Recent Labs    10/01/19 0602  LABPROT 14.1  INR 1.1    STUDIES: CT Angio Chest/Abd/Pel for Dissection W and/or Wo Contrast  Result Date: 09/30/2019 CLINICAL DATA:  Abdominal pain, aortic dissection suspected, history of breast cancer EXAM: CT ANGIOGRAPHY CHEST, ABDOMEN AND PELVIS TECHNIQUE: Non-contrast CT of the chest was initially obtained. Multidetector CT imaging through the chest, abdomen and pelvis was performed using the standard protocol during bolus administration of intravenous contrast. Multiplanar reconstructed images and MIPs were obtained and reviewed to evaluate the vascular anatomy.  CONTRAST:  25mL OMNIPAQUE IOHEXOL 350 MG/ML SOLN COMPARISON:  CT chest, abdomen and pelvis 09/28/2019 FINDINGS: CTA CHEST FINDINGS Cardiovascular: Initial noncontrast CT of the chest reveals an atherosclerotic thoracic aorta without hyperdense mural thickening or plaque displacement to suggest intramural hematoma. Postcontrast administration there is satisfactory, preferential opacification of the thoracic aorta. The aortic root is suboptimally assessed given cardiac pulsation artifact. Calcifications present on the aortic leaflets. Atherosclerotic plaque within the normal caliber aorta. No acute luminal abnormality of the imaged aorta. No periaortic stranding or hemorrhage. Shared origin of the brachiocephalic and left common carotid arteries. Atherosclerotic plaque in the proximal great vessels without acute luminal abnormality or occlusion. Normal heart size. No pericardial effusion. Three-vessel coronary artery atherosclerosis is noted. Central pulmonary arteries are normal caliber. No acute hyperdense central, lobar or proximal segmental filling defects on this non tailored examination of the pulmonary arteries. Some calcification present in a segmental and subsegmental branches of the medial basal segment right lower lobe may reflect more chronic pulmonary embolic disease, unchanged from numerous priors. Mediastinum/Nodes: No mediastinal fluid or gas. Stable 7 mm hypoattenuating nodule in the right lobe thyroid gland. Not clinically significant and no further evaluation is warranted. No acute abnormality of the trachea. Small sliding-type hiatal hernia. Esophagus otherwise unremarkable. No worrisome mediastinal, hilar or axillary adenopathy. Lungs/Pleura: Some increasing basilar atelectatic changes most pronounced in the right lower lobe with additional bandlike areas of subsegmental atelectasis and/or scarring. No consolidation, features of edema, pneumothorax, or effusion. Stable appearance of a 3 mm nodule  in the periphery of the left lower lobe. No other acute or concerning pulmonary nodules or masses. Musculoskeletal: Multiple ill-defined sclerotic foci present throughout the thoracic spine, bilateral ribs, and sternum. Multilevel degenerative changes are present in the imaged portions of the spine. Additional degenerative changes in the bilateral shoulders. Review of the MIP images confirms the above findings. CTA ABDOMEN AND PELVIS FINDINGS VASCULAR Aorta: Atherosclerotic plaque within the normal caliber aorta. No acute luminal abnormality. No aneurysm or ectasia. No periaortic stranding or hemorrhage. Celiac: Moderate ostial plaque narrowing with a second focus of narrowing in the proximal celiac access demonstrating a hook like configuration on sagittal reformation (9/122) suggesting some compression by the median arcuate ligament with mild poststenotic dilatation of the vessel up to 8 mm in diameter, smoothly tapering to a more normal appearance the otherwise normal branching pattern. SMA: Patent without evidence of aneurysm,  dissection, vasculitis or significant stenosis. Renals: Single renal arteries bilaterally. Mild bilateral ostial plaque narrowing. No evidence of aneurysm, dissection, vasculitis or fibromuscular dysplasia. IMA: Mild-to-moderate ostial plaque narrowing. Otherwise normal distal opacification and branching pattern. Inflow: Calcified noncalcified atheromatous plaque throughout the common, internal external iliac branches. No evidence of aneurysm, dissection or vasculitis. Proximal outflow vessels including the common, superficial and profundus femoral arteries with mild plaque but no significant stenosis, occlusion, dissection or features of vasculitis. Veins: No obvious venous abnormality within the limitations of this arterial phase study. Review of the MIP images confirms the above findings. NON-VASCULAR Hepatobiliary: Innumerable hypoattenuating lesions present throughout the liver  including larger confluent lesions spanning segments 4, 5 and 8, largest again measuring up to 8.2 cm in size though poorly characterized compared to the portal venous study is performed recently (09/28/2019, 09/06/2019). Difficult to assess for new or enlarging lesions in this arterial phase exam. Nodular hepatic liver surface contour much of which is attributable to the subcapsular location of many of these lesions. Persistent gallbladder distension with some increasingly conspicuous gallbladder wall thickening towards the fundus and adjacent pericholecystic inflammatory change (5/118). No visible calcified gallstones. No biliary ductal dilatation. Pancreas: Unremarkable. No pancreatic ductal dilatation or surrounding inflammatory changes. Spleen: Normal in size. No concerning splenic lesions. Adrenals/Urinary Tract: Redemonstration of a 1.2 cm nodule in the body of the right adrenal gland. No discernible left adrenal nodules. Fluid attenuation cyst again seen in the lower pole right kidney measuring up to 3.4 cm. No concerning renal mass, urolithiasis or hydronephrosis. Kidneys enhance symmetrically. Urinary bladder is largely decompressed at the time of exam and therefore poorly evaluated by CT imaging. No gross bladder abnormality. Stomach/Bowel: Small sliding-type hiatal hernia. Stomach is unremarkable. Few air-filled duodenal diverticula. Few nonspecific fluid-filled though nondilated thickened loops of small bowel. No other conspicuous small bowel segments. Cecum is displaced anterior to the liver. A normal appendix is visualized. No colonic dilatation or wall thickening. Scattered colonic diverticula without focal inflammation to suggest diverticulitis. Lymphatic: No suspicious or enlarged lymph nodes in the included lymphatic chains. Reproductive: Multiple calcified uterine fibroids. Nonspecific endometrial thickening to 7 mm, greater than expected for this advanced age female. No concerning adnexal  lesions. Other: Small volume of fluid in the abdomen predominantly within the subphrenic spaces, pericolic gutters and deep pelvis. Musculoskeletal: Background of multilevel degenerative changes in the spine hips and pelvis. Review of the MIP images confirms the above findings. IMPRESSION: Vascular 1. No evidence of acute aortic syndrome. 2.  Aortic Atherosclerosis (ICD10-I70.0). 3. Three-vessel coronary artery disease. 4. Moderate ostial plaque narrowing of the celiac, bilateral renal arteries and IMA. 5. Hook-like configuration of the celiac axis may reflect compression by the median arcuate ligament/arcuate ligament syndrome. Nonvascular 1. Increasingly conspicuous gallbladder wall thickening towards the fundus with some pericholecystic inflammation. Correlate with upper abdominal symptoms and consider right upper quadrant ultrasound for further evaluation. 2. Nonspecific fluid-filled appearance of the distal small bowel some questionable mucosal hyperemia. Correlate for features of enteritis. 3. Innumerable hypoattenuating lesions throughout the liver including larger confluent lesions spanning segments 4, 5 and 8, largest again measuring up to 8.2 cm in size though poorly characterized compared to the portal venous study is performed /2021, 09/06/2019). Difficult to assess for new or enlarging lesions in this arterial phase exam. 4. Developing ascites throughout the abdomen, could reflect hepatic dysfunction secondary to the extensive involvement of the liver versus reactive free fluid, correlate with serologies. 5. Small right adrenal nodule, concerning for metastasis. 6.  Multiple ill-defined sclerotic foci throughout the thoracic spine, bilateral ribs, and sternum, concerning for osseous metastatic disease. 7. Nonspecific endometrial thickening to 7 mm, greater than expected for this advanced age female. Recommend further evaluation with nonemergent pelvic ultrasound. 8. Colonic diverticulosis without  evidence of diverticulitis. 9. Small sliding-type hiatal hernia. 10. Aortic Atherosclerosis (ICD10-I70.0). Electronically Signed   By: Lovena Le M.D.   On: 09/30/2019 22:26   US ABDOMEN LIMITED RUQ  Result Date: 09/30/2019 CLINICAL DATA:  Right upper quadrant abdominal pain EXAM: ULTRASOUND ABDOMEN LIMITED RIGHT UPPER QUADRANT COMPARISON:  None. FINDINGS: Gallbladder: The gallbladder wall is thickened measuring approximately 5 mm. The sonographic Percell Miller sign is negative. There is cholelithiasis with gallbladder sludge. Common bile duct: Diameter: 8 mm Liver: Innumerable hepatic masses are noted throughout the liver. Portal vein is patent on color Doppler imaging with normal direction of blood flow towards the liver. Other: None. IMPRESSION: 1. Cholelithiasis without definite sonographic evidence for acute cholecystitis. 2. Mildly thickened gallbladder wall which is nonspecific but can be seen in patients with underlying hepatocellular disease. 3. Innumerable hepatic masses are again noted, consistent with metastatic disease. Electronically Signed   By: Constance Holster M.D.   On: 09/30/2019 21:23      Impression / Plan:   Faith [name] is a 74 y.o. y/o female admitted with septic shock, metastatic breast cancer with biopsy-proven from the liver demonstrating metastatic breast cancer.  I have been called for abnormal LFTs.  No obstruction seen on CT scan or ultrasound but liver has multiple metastatic lesions.  The bone also shows metastatic lesions which could explain elevated alkaline phosphatase.  Patient has been seen by Dr. Celine Ahr and no evidence of cholecystitis requires any intervention at this time.  Plan 1.  Suggest diagnostic abdominal paracentesis to rule out peritonitis and source of sepsis.  I have discussed this with Dr. Roderic Palau.  2.  Check fractionated total bilirubin  3.  Very likely abnormal LFTs secondary to hepatic metastasis and bony metastasis.  The bone metastasis will cause  significant elevation in alkaline phosphatase as alkaline phosphatase can be released from the bone.  Viral hepatitis has been ruled out previously in July 2021.  4.  Very low likelihood of biliary obstruction as common bile duct is not dilated on ultrasound or CT scan.  If concerns still persist MRCP could be obtained  5.  Dr. Mike Gip has been consulted for the metastatic cancer    thank you for involving me in the care of this patient.      LOS: 1 day   Jonathon Bellows, MD  10/01/2019, 11:43 AM

## 2019-10-02 ENCOUNTER — Ambulatory Visit: Payer: Medicare Other | Admitting: Hematology and Oncology

## 2019-10-02 DIAGNOSIS — T68XXXA Hypothermia, initial encounter: Secondary | ICD-10-CM

## 2019-10-02 DIAGNOSIS — I10 Essential (primary) hypertension: Secondary | ICD-10-CM

## 2019-10-02 DIAGNOSIS — I2510 Atherosclerotic heart disease of native coronary artery without angina pectoris: Secondary | ICD-10-CM

## 2019-10-02 DIAGNOSIS — N179 Acute kidney failure, unspecified: Principal | ICD-10-CM

## 2019-10-02 LAB — COMPREHENSIVE METABOLIC PANEL
ALT: 201 U/L — ABNORMAL HIGH (ref 0–44)
AST: 378 U/L — ABNORMAL HIGH (ref 15–41)
Albumin: 2.1 g/dL — ABNORMAL LOW (ref 3.5–5.0)
Alkaline Phosphatase: 522 U/L — ABNORMAL HIGH (ref 38–126)
Anion gap: 9 (ref 5–15)
BUN: 24 mg/dL — ABNORMAL HIGH (ref 8–23)
CO2: 20 mmol/L — ABNORMAL LOW (ref 22–32)
Calcium: 8.3 mg/dL — ABNORMAL LOW (ref 8.9–10.3)
Chloride: 114 mmol/L — ABNORMAL HIGH (ref 98–111)
Creatinine, Ser: 1.31 mg/dL — ABNORMAL HIGH (ref 0.44–1.00)
GFR calc Af Amer: 46 mL/min — ABNORMAL LOW (ref >60)
GFR calc non Af Amer: 40 mL/min — ABNORMAL LOW (ref >60)
Glucose, Bld: 82 mg/dL (ref 70–99)
Potassium: 3.2 mmol/L — ABNORMAL LOW (ref 3.5–5.1)
Sodium: 143 mmol/L (ref 135–145)
Total Bilirubin: 1.8 mg/dL — ABNORMAL HIGH (ref 0.3–1.2)
Total Protein: 5 g/dL — ABNORMAL LOW (ref 6.5–8.1)

## 2019-10-02 LAB — CBC
HCT: 31.7 % — ABNORMAL LOW (ref 36.0–46.0)
Hemoglobin: 11 g/dL — ABNORMAL LOW (ref 12.0–15.0)
MCH: 30 pg (ref 26.0–34.0)
MCHC: 34.7 g/dL (ref 30.0–36.0)
MCV: 86.4 fL (ref 80.0–100.0)
Platelets: 102 10*3/uL — ABNORMAL LOW (ref 150–400)
RBC: 3.67 MIL/uL — ABNORMAL LOW (ref 3.87–5.11)
RDW: 16.3 % — ABNORMAL HIGH (ref 11.5–15.5)
WBC: 10 10*3/uL (ref 4.0–10.5)
nRBC: 0.8 % — ABNORMAL HIGH (ref 0.0–0.2)

## 2019-10-02 MED ORDER — SODIUM CHLORIDE 0.9 % IV SOLN: INTRAVENOUS | Status: DC

## 2019-10-02 MED ORDER — POTASSIUM CHLORIDE CRYS ER 20 MEQ PO TBCR
40.0000 meq | EXTENDED_RELEASE_TABLET | ORAL | Status: AC
  Administered 2019-10-02: 40 meq via ORAL
  Filled 2019-10-02: qty 2

## 2019-10-02 MED ORDER — KETOROLAC TROMETHAMINE 15 MG/ML IJ SOLN
15.0000 mg | Freq: Once | INTRAMUSCULAR | Status: AC
  Administered 2019-10-02: 15 mg via INTRAVENOUS
  Filled 2019-10-02: qty 1

## 2019-10-02 NOTE — Progress Notes (Signed)
Patient care taken over by this RN.  Patient resting in bed without complaints at this time.

## 2019-10-02 NOTE — Progress Notes (Signed)
Pharmacy Antibiotic Note  Faith [name] is a 74 y.o. female admitted on 09/30/2019 with sepsis. Upon admission patient was hypothermic (89.42F), hypotensive (72/43mmHg), HR 96 bpm. Lactic acid 5.9>4.4>1.8. Patient was started on cefepime and metronidazole. Vancomycin was started, but patient only received one dose then was d/c'd. Pharmacy has been consulted for cefepime dosing.  9/12 US abdomen: Cholelithiasis without definite sonographic evidence for acute Cholecystitis. GI suggesting abdominal paracentesis to r/o peritonitis and source of sepsis.  Scr 0.97>1.54>1.56>1.31  Plan: Continue cefepime 2g IV q12 hr Continue to monitor renal function and adjust doses per changes in renal function   Height: 5\' 7"  (170.2 cm) Weight: 88.9 kg (196 lb) IBW/kg (Calculated) : 61.6  Temp (24hrs), Avg:98.6 F (37 C), Min:98.1 F (36.7 C), Max:99 F (37.2 C)  Recent Labs  Lab 09/28/19 1051 09/28/19 1436 09/29/19 0451 09/30/19 1947 09/30/19 2144 10/01/19 0038 10/01/19 0602 10/02/19 0452  WBC 10.6*  --  10.0 13.2*  --   --  20.8* 10.0  CREATININE 1.41*  --  0.97 1.54*  --   --  1.56* 1.31*  LATICACIDVEN 2.3* 1.8  --  5.9* 4.4* 3.1*  --   --     Estimated Creatinine Clearance: 42.5 mL/min (A) (by C-G formula based on SCr of 1.31 mg/dL (H)).    Allergies  Allergen Reactions  . Mushroom Extract Complex Hives and Swelling  . Nickel Rash  . Shellfish Allergy Nausea And Vomiting   Antibiotics since admission: 9/13 Vancomcyin x1 9/12 metronidazole >> 9/12 cefepime>>  Microbiology: 9/12 Bcx: NGTD   Thank you for allowing pharmacy to be a part of this patient's care.  Sherilyn Banker, PharmD Pharmacy Resident  10/02/2019 12:46 PM

## 2019-10-02 NOTE — Progress Notes (Addendum)
PROGRESS NOTE    [facility]   TKW:[id number]  DOB: 21-May-1945  PCP: Leonel Ramsay, MD    DOA: 09/30/2019 LOS: 2   Brief Narrative   [name] a74 y.o.femalewith a known history of coronary artery disease, type diabetes mellitus, hypertension, dyslipidemia, [name] She stated that she felt fine when she went home yesterday till this afternoon when she felt weak and dizzy and could not stand or walk. She admitted to associated abdominal pain epigastric and right upper quadrant area. She denied any nausea or vomiting or diarrhea. No fever or chills. No chest pain or dyspnea or cough or wheezing. No dysuria, oliguria, urgency or frequency or flank pain.  Upon presentation to the emergency room, hypertension was 89.44F/31.7C,blood pressure 72/44 with a heart rate of 96 respiratory to 24 and pulse 70 was 98% on room air.With clear bolus of IV normal saline her blood pressure was up to 130/48. Labs were remarkable for anion gap of 16, magnesium of 1.9 with a potassium of 3.8 BUN of 17 with a creatinine of 1.54 compared to 21 and 0.97 on 9/11, high-sensitivity troponin I 160 and later 111, AST 358 ALT of 218 and alk phos of 900 with total bili of 2.2 albumin of 3 and total protein of 6.5.The patient's alk phos was 669 on 9/10 with total bili of 2.3, AST is 225 and ALT 177. Lactic acid came back 5.9 and later down to 4.4 from 1.8 on 9/10. CBC showed leukocytosis of 13.2 with neutrophilia as well as thrombocytopenia of 135. WBCs were 10 on 9/11. At 500 mL D-dimer was more than 7500, Tylenol level 10 and salicylate less than 7. TSH was 4.2 and free T4 1.37 both slightly elevated. Blood cultures were  drawn. EKG showed sinus rhythm with rate of 98 with occasional PVCs and possible left atrial enlargement. Right upper quadrant ultrasound revealed the following: 1. Cholelithiasis without definite sonographic evidence for acute cholecystitis. 2. Mildly thickened gallbladder wall which is nonspecific but can be seen in patients with underlying hepatocellular disease. 3. Innumerable hepatic masses are again noted, consistent with metastatic disease.  The patient was given IV cefepime and Flagyl, IV Zofran and 3 L bolus of IV normal saline. She will be admitted to a progressive unit bed for further evaluation and management.   Assessment & Plan   Active Problems:   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 female, estrogen receptor positive ([place])   Elevated LFTs   Bone metastasis (HCC)   Liver metastasis (HCC)   HLD (hyperlipidemia)   Acute renal failure superimposed on stage 3a chronic kidney disease (HCC)   Sepsis (HCC)   Leukocytosis   No changes to assessment and plan today.  Agree with below by Dr. Roderic Palau 9/13: 1. SIRS.  No clear source of infection at this time.  She did present with hypotension, elevated lactate, hypothermia and tachypnea.  No evidence of pneumonia on chest imaging.  She is on broad-spectrum antibiotics.  Follow-up blood cultures.  Overall hemodynamics have improved with IV fluids.  Initial plans were for paracentesis to rule out SBP, but she does not have significant ascites to warrant paracentesis. 2. Elevated liver enzymes.  Initially, based on imaging there was concern for cholecystitis.  Seen  by general surgery high-dose felt unlikely that patient had cholecystitis.  She is also being seen by GI and it is thought that her elevated liver enzymes likely related to underlying cancer.  If they do not improve, can potentially consider MRCP 3. Acute kidney injury on chronic kidney disease stage III.  Likely related to hypotension.  Continue IV fluids and  monitor renal function. 4. Left breast cancer with metastases to liver and bone.  Oncology consulted. 5. Hyperlipidemia.  Holding statin in light of elevated liver enzymes 6. Leukocytosis.  Possibly reactive to #1.  She also received a dose of intravenous steroids yesterday while she was hypotensive.  She is currently afebrile.  Continue to monitor. 7. History of hypertension.  Antihypertensives currently on hold in light of recent hypotension.   In addition:  Patient to have chemo port placed.  Was to be done outpatient before she presented and got admitted. IR is aware but wants infection/sepsis definitively ruled out first.    Obesity: Body mass index is 30.7 kg/m.  Complicates overall care and prognosis.  DVT prophylaxis: enoxaparin (LOVENOX) injection 40 mg Start: 10/01/19 0000   Diet:  Diet Orders (From admission, onward)    Start     Ordered   10/01/19 1325  Diet Heart Room service appropriate? Yes; Fluid consistency: Thin  Diet effective now       Question Answer Comment  Room service appropriate? Yes   Fluid consistency: Thin      10/01/19 1324            Code Status: Full Code    Subjective 10/02/19    Patient seen at bedside this AM.  She reports back pain overnight.  Has some mild nausea but no vomiting.  Some RUQ pain at times.  Is hoping to have port placed soon for chemotherapy, understands need to rule out infection first.     Disposition Plan & Communication   Status is: Inpatient  Remains inpatient appropriate because:Ongoing diagnostic testing needed not appropriate for outpatient work up   Dispo: The patient is from: Home              Anticipated d/c is to: Home              Anticipated d/c date is: 2 days              Patient currently is not medically stable to d/c.        Family Communication: none at bedside, will attempt to call    Consults, Procedures, Significant Events   Consultants:    Oncology  ID  GI  Surgery  Procedures:   none  Antimicrobials:  Anti-infectives (From admission, onward)   Start     Dose/Rate Route Frequency Ordered Stop   10/02/19 0200  vancomycin (VANCOCIN) IVPB 1000 mg/200 mL premix  Status:  Discontinued        1,000 mg 200 mL/hr over 60 Minutes Intravenous Every 24 hours 10/01/19 0316 10/01/19 1208   10/01/19 1000  ceFEPIme (MAXIPIME) 2 g in sodium chloride 0.9 % 100 mL IVPB        2 g 200 mL/hr over 30 Minutes Intravenous Every 12 hours 09/30/19 2356     10/01/19 0600  metroNIDAZOLE (FLAGYL) IVPB 500 mg        500 mg 100 mL/hr over 60 Minutes Intravenous Every 8 hours 09/30/19 2356     10/01/19 0000  vancomycin (VANCOREADY) IVPB 1750 mg/350 mL  1,750 mg 175 mL/hr over 120 Minutes Intravenous  Once 09/30/19 2356 10/01/19 0426   09/30/19 2045  ceFEPIme (MAXIPIME) 2 g in sodium chloride 0.9 % 100 mL IVPB        2 g 200 mL/hr over 30 Minutes Intravenous  Once 09/30/19 2041 09/30/19 2157   09/30/19 2045  metroNIDAZOLE (FLAGYL) IVPB 500 mg        500 mg 100 mL/hr over 60 Minutes Intravenous  Once 09/30/19 2041 09/30/19 2257         Objective   Vitals:   10/02/19 0436 10/02/19 0822 10/02/19 1109 10/02/19 1618  BP: (!) 126/48 138/64 (!) 118/56 (!) 147/60  Pulse: 85 87 95 86  Resp: '17 16 17 16  ' Temp: 99 F (37.2 C) 98.6 F (37 C) 98.7 F (37.1 C) 99.1 F (37.3 C)  TempSrc: Oral Oral Oral Oral  SpO2: 92% 93% 93% 92%  Weight:      Height:        Intake/Output Summary (Last 24 hours) at 10/02/2019 1801 Last data filed at 10/02/2019 0950 Gross per 24 hour  Intake 1881.36 ml  Output --  Net 1881.36 ml   Filed Weights   09/30/19 1932 09/30/19 1950  Weight: 88.9 kg 88.9 kg    Physical Exam:  General exam: awake, alert, no acute distress Respiratory system: CTAB, normal respiratory effort. Cardiovascular system: normal S1/S2, RRR, chronic lymphedema.   Gastrointestinal system: soft, protuberant but not  distended, +bowel sounds. Central nervous system: A&O x4. no gross focal neurologic deficits, normal speech Extremities: moves all, lyphedema, normal tone Psychiatry: normal mood, congruent affect, judgement and insight appear normal  Labs   Data Reviewed: I have personally reviewed following labs and imaging studies  CBC: Recent Labs  Lab 09/28/19 1051 09/29/19 0451 09/30/19 1947 10/01/19 0602 10/02/19 0452  WBC 10.6* 10.0 13.2* 20.8* 10.0  NEUTROABS 9.0*  --  12.5*  --   --   HGB 13.2 11.4* 14.0 12.1 11.0*  HCT 39.1 34.2* 42.0 35.2* 31.7*  MCV 85.6 87.7 87.1 85.6 86.4  PLT 151 119* 135* 111* 008*   Basic Metabolic Panel: Recent Labs  Lab 09/28/19 1051 09/29/19 0451 09/30/19 1947 10/01/19 0602 10/02/19 0452  NA 141 140 141 141 143  K 4.1 3.9 3.8 4.6 3.2*  CL 104 107 104 108 114*  CO2 26 22 21* 21* 20*  GLUCOSE 126* 122* 113* 115* 82  BUN '22 21 17 17 ' 24*  CREATININE 1.41* 0.97 1.54* 1.56* 1.31*  CALCIUM 9.9 9.2 10.2 8.7* 8.3*  MG  --   --  1.9  --   --   PHOS  --   --  2.3*  --   --    GFR: Estimated Creatinine Clearance: 42.5 mL/min (A) (by C-G formula based on SCr of 1.31 mg/dL (H)). Liver Function Tests: Recent Labs  Lab 09/28/19 1051 09/30/19 1947 10/01/19 0602 10/02/19 0452  AST 225* 358* 415* 378*  ALT 177* 218* 220* 201*  ALKPHOS 669* 900* 615* 522*  BILITOT 2.3* 2.2* 2.1* 1.8*  PROT 6.3* 6.5 5.5* 5.0*  ALBUMIN 3.1* 3.0* 2.4* 2.1*   Recent Labs  Lab 09/30/19 1947  LIPASE 73*   No results for input(s): AMMONIA in the last 168 hours. Coagulation Profile: Recent Labs  Lab 10/01/19 0602  INR 1.1   Cardiac Enzymes: No results for input(s): CKTOTAL, CKMB, CKMBINDEX, TROPONINI in the last 168 hours. BNP (last 3 results) No results for input(s): PROBNP in the last 8760  hours. HbA1C: No results for input(s): HGBA1C in the last 72 hours. CBG: Recent Labs  Lab 09/28/19 2203 09/29/19 0855 10/01/19 0723 10/01/19 1135 10/01/19 1557  GLUCAP  171* 100* 118* 130* 190*   Lipid Profile: No results for input(s): CHOL, HDL, LDLCALC, TRIG, CHOLHDL, LDLDIRECT in the last 72 hours. Thyroid Function Tests: Recent Labs    09/30/19 1947  TSH 5.203*  FREET4 1.37*   Anemia Panel: No results for input(s): VITAMINB12, FOLATE, FERRITIN, TIBC, IRON, RETICCTPCT in the last 72 hours. Sepsis Labs: Recent Labs  Lab 09/28/19 1436 09/30/19 1947 09/30/19 2144 10/01/19 0038 10/01/19 0602  PROCALCITON  --   --   --   --  7.07  LATICACIDVEN 1.8 5.9* 4.4* 3.1*  --     Recent Results (from the past 240 hour(s))  SARS CORONAVIRUS 2 (TAT 6-24 HRS) Nasopharyngeal Nasopharyngeal Swab     Status: None   Collection Time: 09/26/19 11:21 AM   Specimen: Nasopharyngeal Swab  Result Value Ref Range Status   SARS Coronavirus 2 NEGATIVE NEGATIVE Final    Comment: (NOTE) SARS-CoV-2 target nucleic acids are NOT DETECTED.  The SARS-CoV-2 RNA is generally detectable in upper and lower respiratory specimens during the acute phase of infection. Negative results do not preclude SARS-CoV-2 infection, do not rule out co-infections with other pathogens, and should not be used as the sole basis for treatment or other patient management decisions. Negative results must be combined with clinical observations, patient history, and epidemiological information. The expected result is Negative.  Fact Sheet for Patients: SugarRoll.be  Fact Sheet for Healthcare Providers: https://www.woods-mathews.com/  This test is not yet approved or cleared by the [place] FDA and  has been authorized for detection and/or diagnosis of SARS-CoV-2 by FDA under an Emergency Use Authorization (EUA). This EUA will remain  in effect (meaning this test can be used) for the duration of the COVID-19 declaration under Se ction 564(b)(1) of the Act, 21 U.S.C. section 360bbb-3(b)(1), unless the authorization is terminated or revoked  sooner.  Performed at [facility], [place] [address] [place], [place] [address]   SARS Coronavirus 2 by RT PCR (hospital order, performed in [facility] hospital lab) Nasopharyngeal Nasopharyngeal Swab     Status: None   Collection Time: 09/28/19 10:51 AM   Specimen: Nasopharyngeal Swab  Result Value Ref Range Status   SARS Coronavirus 2 NEGATIVE NEGATIVE Final    Comment: (NOTE) SARS-CoV-2 target nucleic acids are NOT DETECTED.  The SARS-CoV-2 RNA is generally detectable in upper and lower respiratory specimens during the acute phase of infection. The lowest concentration of SARS-CoV-2 viral copies this assay can detect is 250 copies / mL. A negative result does not preclude SARS-CoV-2 infection and should not be used as the sole basis for treatment or other patient management decisions.  A negative result may occur with improper specimen collection / handling, submission of specimen other than nasopharyngeal swab, presence of viral mutation(s) within the areas targeted by this assay, and inadequate number of viral copies (<250 copies / mL). A negative result must be combined with clinical observations, patient history, and epidemiological information.  Fact Sheet for Patients:   StrictlyIdeas.no  Fact Sheet for Healthcare Providers: BankingDealers.co.za  This test is not yet approved or  cleared by the [place] FDA and has been authorized for detection and/or diagnosis of SARS-CoV-2 by FDA under an Emergency Use Authorization (EUA).  This EUA will remain in effect (meaning this test can be used) for the  duration of the COVID-19 declaration under Section 564(b)(1) of the Act, 21 U.S.C. section 360bbb-3(b)(1), unless the authorization is terminated or revoked sooner.  Performed at [facility], [address] [place], [place] [address]   Blood culture (single)     Status: None (Preliminary result)    Collection Time: 09/30/19  7:47 PM   Specimen: BLOOD  Result Value Ref Range Status   Specimen Description BLOOD RIGHT [facility]  Final   Special Requests   Final    BOTTLES DRAWN AEROBIC AND ANAEROBIC Blood Culture results may not be optimal due to an excessive volume of blood received in culture bottles   Culture   Final    NO GROWTH 2 DAYS Performed at [facility], [address], [place], [place] [address]    Report Status PENDING  Incomplete  Culture, blood (single)     Status: None (Preliminary result)   Collection Time: 09/30/19  9:44 PM   Specimen: BLOOD  Result Value Ref Range Status   Specimen Description BLOOD RIGHT [facility]  Final   Special Requests   Final    BOTTLES DRAWN AEROBIC AND ANAEROBIC Blood Culture adequate volume   Culture   Final    NO GROWTH 2 DAYS Performed at [facility], [address] [place], [place] [address]    Report Status PENDING  Incomplete  SARS Coronavirus 2 by RT PCR (hospital order, performed in [place] hospital lab) Nasopharyngeal Nasopharyngeal Swab     Status: None   Collection Time: 10/01/19 12:38 AM   Specimen: Nasopharyngeal Swab  Result Value Ref Range Status   SARS Coronavirus 2 NEGATIVE NEGATIVE Final    Comment: (NOTE) SARS-CoV-2 target nucleic acids are NOT DETECTED.  The SARS-CoV-2 RNA is generally detectable in upper and lower respiratory specimens during the acute phase of infection. The lowest concentration of SARS-CoV-2 viral copies this assay can detect is 250 copies / mL. A negative result does not preclude SARS-CoV-2 infection and should not be used as the sole basis for treatment or other patient management decisions.  A negative result may occur with improper specimen collection / handling, submission of specimen other than nasopharyngeal swab, presence of viral mutation(s) within the areas targeted by this assay, and inadequate number of viral copies (<250 copies / mL). A negative result must be  combined with clinical observations, patient history, and epidemiological information.  Fact Sheet for Patients:   StrictlyIdeas.no  Fact Sheet for Healthcare Providers: BankingDealers.co.za  This test is not yet approved or  cleared by the [place] FDA and has been authorized for detection and/or diagnosis of SARS-CoV-2 by FDA under an Emergency Use Authorization (EUA).  This EUA will remain in effect (meaning this test can be used) for the duration of the COVID-19 declaration under Section 564(b)(1) of the Act, 21 U.S.C. section 360bbb-3(b)(1), unless the authorization is terminated or revoked sooner.  Performed at [facility], [place], [place] [address]       Imaging Studies   CT Angio Chest/Abd/Pel for Dissection W and/or Wo Contrast  Result Date: 09/30/2019 CLINICAL DATA:  Abdominal pain, aortic dissection suspected, history of breast cancer EXAM: CT ANGIOGRAPHY CHEST, ABDOMEN AND PELVIS TECHNIQUE: Non-contrast CT of the chest was initially obtained. Multidetector CT imaging through the chest, abdomen and pelvis was performed using the standard protocol during bolus administration of intravenous contrast. Multiplanar reconstructed images and MIPs were obtained and reviewed to evaluate the vascular anatomy. CONTRAST:  58m OMNIPAQUE IOHEXOL  350 MG/ML SOLN COMPARISON:  CT chest, abdomen and pelvis 09/28/2019 FINDINGS: CTA CHEST FINDINGS Cardiovascular: Initial noncontrast CT of the chest reveals an atherosclerotic thoracic aorta without hyperdense mural thickening or plaque displacement to suggest intramural hematoma. Postcontrast administration there is satisfactory, preferential opacification of the thoracic aorta. The aortic root is suboptimally assessed given cardiac pulsation artifact. Calcifications present on the aortic leaflets. Atherosclerotic plaque within the normal caliber aorta. No acute luminal  abnormality of the imaged aorta. No periaortic stranding or hemorrhage. Shared origin of the brachiocephalic and left common carotid arteries. Atherosclerotic plaque in the proximal great vessels without acute luminal abnormality or occlusion. Normal heart size. No pericardial effusion. Three-vessel coronary artery atherosclerosis is noted. Central pulmonary arteries are normal caliber. No acute hyperdense central, lobar or proximal segmental filling defects on this non tailored examination of the pulmonary arteries. Some calcification present in a segmental and subsegmental branches of the medial basal segment right lower lobe may reflect more chronic pulmonary embolic disease, unchanged from numerous priors. Mediastinum/Nodes: No mediastinal fluid or gas. Stable 7 mm hypoattenuating nodule in the right lobe thyroid gland. Not clinically significant and no further evaluation is warranted. No acute abnormality of the trachea. Small sliding-type hiatal hernia. Esophagus otherwise unremarkable. No worrisome mediastinal, hilar or axillary adenopathy. Lungs/Pleura: Some increasing basilar atelectatic changes most pronounced in the right lower lobe with additional bandlike areas of subsegmental atelectasis and/or scarring. No consolidation, features of edema, pneumothorax, or effusion. Stable appearance of a 3 mm nodule in the periphery of the left lower lobe. No other acute or concerning pulmonary nodules or masses. Musculoskeletal: Multiple ill-defined sclerotic foci present throughout the thoracic spine, bilateral ribs, and sternum. Multilevel degenerative changes are present in the imaged portions of the spine. Additional degenerative changes in the bilateral shoulders. Review of the MIP images confirms the above findings. CTA ABDOMEN AND PELVIS FINDINGS VASCULAR Aorta: Atherosclerotic plaque within the normal caliber aorta. No acute luminal abnormality. No aneurysm or ectasia. No periaortic stranding or hemorrhage.  Celiac: Moderate ostial plaque narrowing with a second focus of narrowing in the proximal celiac access demonstrating a hook like configuration on sagittal reformation (9/122) suggesting some compression by the median arcuate ligament with mild poststenotic dilatation of the vessel up to 8 mm in diameter, smoothly tapering to a more normal appearance the otherwise normal branching pattern. SMA: Patent without evidence of aneurysm, dissection, vasculitis or significant stenosis. Renals: Single renal arteries bilaterally. Mild bilateral ostial plaque narrowing. No evidence of aneurysm, dissection, vasculitis or fibromuscular dysplasia. IMA: Mild-to-moderate ostial plaque narrowing. Otherwise normal distal opacification and branching pattern. Inflow: Calcified noncalcified atheromatous plaque throughout the common, internal external iliac branches. No evidence of aneurysm, dissection or vasculitis. Proximal outflow vessels including the common, superficial and profundus femoral arteries with mild plaque but no significant stenosis, occlusion, dissection or features of vasculitis. Veins: No obvious venous abnormality within the limitations of this arterial phase study. Review of the MIP images confirms the above findings. NON-VASCULAR Hepatobiliary: Innumerable hypoattenuating lesions present throughout the liver including larger confluent lesions spanning segments 4, 5 and 8, largest again measuring up to 8.2 cm in size though poorly characterized compared to the portal venous study is performed recently (09/28/2019, 09/06/2019). Difficult to assess for new or enlarging lesions in this arterial phase exam. Nodular hepatic liver surface contour much of which is attributable to the subcapsular location of many of these lesions. Persistent gallbladder distension with some increasingly conspicuous gallbladder wall thickening towards the fundus and adjacent pericholecystic inflammatory  change (5/118). No visible calcified  gallstones. No biliary ductal dilatation. Pancreas: Unremarkable. No pancreatic ductal dilatation or surrounding inflammatory changes. Spleen: Normal in size. No concerning splenic lesions. Adrenals/Urinary Tract: Redemonstration of a 1.2 cm nodule in the body of the right adrenal gland. No discernible left adrenal nodules. Fluid attenuation cyst again seen in the lower pole right kidney measuring up to 3.4 cm. No concerning renal mass, urolithiasis or hydronephrosis. Kidneys enhance symmetrically. Urinary bladder is largely decompressed at the time of exam and therefore poorly evaluated by CT imaging. No gross bladder abnormality. Stomach/Bowel: Small sliding-type hiatal hernia. Stomach is unremarkable. Few air-filled duodenal diverticula. Few nonspecific fluid-filled though nondilated thickened loops of small bowel. No other conspicuous small bowel segments. Cecum is displaced anterior to the liver. A normal appendix is visualized. No colonic dilatation or wall thickening. Scattered colonic diverticula without focal inflammation to suggest diverticulitis. Lymphatic: No suspicious or enlarged lymph nodes in the included lymphatic chains. Reproductive: Multiple calcified uterine fibroids. Nonspecific endometrial thickening to 7 mm, greater than expected for this advanced age female. No concerning adnexal lesions. Other: Small volume of fluid in the abdomen predominantly within the subphrenic spaces, pericolic gutters and deep pelvis. Musculoskeletal: Background of multilevel degenerative changes in the spine hips and pelvis. Review of the MIP images confirms the above findings. IMPRESSION: Vascular 1. No evidence of acute aortic syndrome. 2.  Aortic Atherosclerosis (ICD10-I70.0). 3. Three-vessel coronary artery disease. 4. Moderate ostial plaque narrowing of the celiac, bilateral renal arteries and IMA. 5. Hook-like configuration of the celiac axis may reflect compression by the median arcuate ligament/arcuate  ligament syndrome. Nonvascular 1. Increasingly conspicuous gallbladder wall thickening towards the fundus with some pericholecystic inflammation. Correlate with upper abdominal symptoms and consider right upper quadrant ultrasound for further evaluation. 2. Nonspecific fluid-filled appearance of the distal small bowel some questionable mucosal hyperemia. Correlate for features of enteritis. 3. Innumerable hypoattenuating lesions throughout the liver including larger confluent lesions spanning segments 4, 5 and 8, largest again measuring up to 8.2 cm in size though poorly characterized compared to the portal venous study is performed /2021, 09/06/2019). Difficult to assess for new or enlarging lesions in this arterial phase exam. 4. Developing ascites throughout the abdomen, could reflect hepatic dysfunction secondary to the extensive involvement of the liver versus reactive free fluid, correlate with serologies. 5. Small right adrenal nodule, concerning for metastasis. 6. Multiple ill-defined sclerotic foci throughout the thoracic spine, bilateral ribs, and sternum, concerning for osseous metastatic disease. 7. Nonspecific endometrial thickening to 7 mm, greater than expected for this advanced age female. Recommend further evaluation with nonemergent pelvic ultrasound. 8. Colonic diverticulosis without evidence of diverticulitis. 9. Small sliding-type hiatal hernia. 10. Aortic Atherosclerosis (ICD10-I70.0). Electronically Signed   By: Lovena Le M.D.   On: 09/30/2019 22:26   US ABDOMEN LIMITED RUQ  Result Date: 09/30/2019 CLINICAL DATA:  Right upper quadrant abdominal pain EXAM: ULTRASOUND ABDOMEN LIMITED RIGHT UPPER QUADRANT COMPARISON:  None. FINDINGS: Gallbladder: The gallbladder wall is thickened measuring approximately 5 mm. The sonographic Percell Miller sign is negative. There is cholelithiasis with gallbladder sludge. Common bile duct: Diameter: 8 mm Liver: Innumerable hepatic masses are noted throughout the  liver. Portal vein is patent on color Doppler imaging with normal direction of blood flow towards the liver. Other: None. IMPRESSION: 1. Cholelithiasis without definite sonographic evidence for acute cholecystitis. 2. Mildly thickened gallbladder wall which is nonspecific but can be seen in patients with underlying hepatocellular disease. 3. Innumerable hepatic masses are again noted, consistent with  metastatic disease. Electronically Signed   By: Constance Holster M.D.   On: 09/30/2019 21:23     Medications   Scheduled Meds: . vitamin C  1,000 mg Oral Daily  . cholecalciferol  5,000 Units Oral BID  . enoxaparin (LOVENOX) injection  40 mg Subcutaneous Q24H   Continuous Infusions: . sodium chloride 100 mL/hr at 10/02/19 1315  . ceFEPime (MAXIPIME) IV 2 g (10/02/19 1107)  . metronidazole 500 mg (10/02/19 1405)       LOS: 2 days    Time spent: 30 minutes    Ezekiel Slocumb, DO [facility]  10/02/2019, 6:01 PM    If 7PM-7AM, please contact night-coverage. How to contact the [facility] Attending or Consulting provider [place] or covering provider during after hours [place], for this patient?    1. Check the care team in [facility] and look for a) attending/consulting TR[facility] provider listed and b) the [facility] team listed 2. Log into [email or web address] and use [place]'s universal password to access. If you do not have the password, please contact the hospital operator. 3. Locate the [facility] provider you are looking for under [facility] and page to a number that you can be directly reached. 4. If you still have difficulty reaching the provider, please page the [facility] (Director on Call) for the Hospitalists listed on amion for assistance.

## 2019-10-02 NOTE — Consult Note (Addendum)
NAME: Faith [name]  DOB: 03-Dec-1945  MRN: [id number]  Date/Time: 10/02/2019 5:23 PM  REQUESTING PROVIDER: Dr. Mike Gip Subjective:  REASON FOR CONSULT: r/o infection ? Faith [name] is a 73 y.o. female with a history of metastatic breast cancer presented to the ED on 09/30/2019 with generalized weakness and malaise.  Patient was admitted to [facility] for the same complaint between 09/28/2019 until 09/29/2019 with near syncopal episode and was diagnosed as acute renal failure/dehydration.  She was also given Solu-Cortef 100 mg stress dose x1 and IV fluids 1 L normal saline followed by continuous drip and the blood pressure improved as well as creatinine improved and she was discharged.  During that hospitalization a CT abdomen and pelvis revealed innumerable confluent hypoenhancing liver masses scattered throughout the liver, new perihepatic ascites, and 1.2 cm right adrenal nodule which was unchanged from a prior CT.   She returned on 09/30/2019 with weakness and abdominal pain.  She also complained of lightheadedness with standing and fatigue which was going on for a few days . In the ED her temperature was 89.1, BP 72/44, heart rate of 96 and pulse ox of 98%.  She had a WBC which was 13.2 which later went up to 20.8, hemoglobin of 14, platelet of 135. Labs revealed sodium of 141, potassium of 3.8, glucose of 113, creatinine of 1.54, total bilirubin of 2.2, AST of 358, ALT of 218, alkaline phosphatase of 900.  She underwent a CT of the chest which showed multiple ill-defined sclerotic foci present throughout the thoracic spine, bilateral ribs and sternum.  There was no aortic dissection or PE.  There was moderate ostial plaque narrowing of the celiac, bilateral renal arteries and inferior mesenteric artery.  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  Procalcitonin was 7.07, LDH 4058 and lactate was 5.9 which later  decreased to 3.1. She was started on vancomycin, cefepime and Flagyl. She was seen by GI as she was having right upper quadrant pain.  The the previous upper quadrant ultrasound on 09/28/2019 demonstrated thickened gallbladder which was nonspecific.  There was cholelithiasis without definite ultrasound evidence of acute cholecystitis. On 09/30/2018 when a repeat ultrasound showed cholelithiasis with no acute evidence of cholecystitis but with innumerable hepatic masses consistent with metastatic disease.  Dr. Vicente Males recommended abdominal paracentesis to rule out peritonitis. Patient was also seen by surgery and they did not think the gallbladder was an issue. Patient was seen by Dr. Mike Gip oncologist who asked me to see the patient. Dr. Mike Gip notes patient initially was diagnosed with metastatic breast cancer in April 2015.  She initially had chemotherapy which was followed by Femara and Ibrance.  She discontinued treatment February 2020. Patient was diagnosed with temporal arteritis when she presented with monocular vision in May 2021.  Her temporal artery biopsy was negative but she was started on pulse dose steroids and admitted to neurology at [facility].  She was discharged at that time on 60 mg of prednisone as well as on Tocilizumab.  She was also started on alendronate for osteoporosis prevention.  She was admitted to [facility] between 07/16/2019 until 07/25/2019 for altered mental status and oropharyngeal dysphagia and found to have steroid-induced hyperglycemia with a glucose of 1376 and a creatinine of 3.2.    .  She also had a blood culture positive for Klebsiella and Aerococcus viridans.  She was initially treated with vancomycin cefepime and Flagyl.  Her LFTs were alkaline  phosphatase of 430, AST of 110 and ALT of 236.  After being treated with insulin drip in the hospital she was discharged home on Lantus and Humalog.  Her steroids were weaned to 20 mg as outpatient.  With that her blood sugar also started  to get better and was less than 100 and hence she stopped taking insulin.  Because of increasing LFTs she underwent abdominal and pelvic CT on 09/06/2019 which revealed more than 15 liver masses and bone mets.  Ultrasound-guided liver biopsy on 09/18/2019 confirm metastatic breast cancer which was ER positive PR positive and HER-2 negative.  She is followed by Dr. Mike Gip.  She was scheduled to initiate chemotherapy on 10/01/2019.  She was followed by Dr. Honor Junes and the last visit was on 09/28/2019.  Past medical history Coronary artery disease status post stent Metastatic breast cancer 2015 status post neoadjuvant chemotherapy, bilateral mastectomy followed by Femara and Ibrance until 03/05/2018. 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 Liver and bone mets from breast cancer 2021. Abnormal LFTs  Past surgical history Bilateral mastectomy Cardiac catheterization and angioplasty Port-A-Cath placement followed by removal Rotator cuff repair of the shoulder Total knee arthroplasty on the right July 2020 and left March 2020 Wrist surgery Social history Former smoker No alcohol or illicit drug use Has 2 horses No travel    Family History  Adopted: Yes   Allergies  Allergen Reactions  . Mushroom Extract Complex Hives and Swelling  . Nickel Rash  . Shellfish Allergy Nausea And Vomiting   Current facility administered medication Cefepime Metronidazole Trazodone Zofran Lovenox Vitamin C Vitamin D3 ?    REVIEW OF SYSTEMS:  Const: negative fever, negative chills, negative weight loss Eyes: loss of vision rt eye ENT: negative coryza, negative sore throat Resp: negative cough, hemoptysis, dyspnea Cards: negative for chest pain, palpitations, lower extremity edema GU: negative for frequency, dysuria and hematuria GI: rt upper quadrant pain Skin: negative  for rash and pruritus Heme: negative for easy bruising and gum/nose bleeding MS: generalized weakness Neurolo:negative for headaches, dizziness, vertigo, memory problems  Psych: negative for feelings of anxiety, depression  Endocrine: , diabetes Allergy/Immunology-as above: Objective:  VITALS:  BP (!) 147/60 (BP Location: Left Arm)   Pulse 86   Temp 99.1 F (37.3 C) (Oral)   Resp 16   Ht _0  (1.702 m)   Wt 88.9 kg   SpO2 92%   BMI 30.70 kg/m  PHYSICAL EXAM:  General: Alert, cooperative, no distress, fatigued Head: Normocephalic, without obvious abnormality, atraumatic. Eyes: Conjunctivae clear, anicteric sclerae. Pupils are equal ENT Nares normal. No drainage or sinus tenderness. Lips, mucosa, and tongue normal. No Thrush Full dentures  Neck: Supple, symmetrical, no adenopathy, thyroid: non tender no carotid bruit and no JVD. Back: No CVA tenderness. Lungs: Clear to auscultation bilaterally. No Wheezing or Rhonchi. No rales. Heart: Regular rate and rhythm, no murmur, rub or gallop. Abdomen: Soft, hepatomegaly  Extremities:edema ankles Skin: No rashes or lesions. Or bruising Lymph: Cervical, supraclavicular normal. Neurologic: Grossly non-focal Pertinent Labs Lab Results CBC    Component Value Date/Time   WBC 10.0 10/02/2019 0452   RBC 3.67 (L) 10/02/2019 0452   HGB 11.0 (L) 10/02/2019 0452   HGB 12.4 05/16/2014 1154   HCT 31.7 (L) 10/02/2019 0452   HCT 36.1 05/16/2014 1154   PLT 102 (L) 10/02/2019 0452   PLT 289 05/16/2014 1154  MCV 86.4 10/02/2019 0452   MCV 96 05/16/2014 1154   MCH 30.0 10/02/2019 0452   MCHC 34.7 10/02/2019 0452   RDW 16.3 (H) 10/02/2019 0452   RDW 16.5 (H) 05/16/2014 1154   LYMPHSABS 0.2 (L) 09/30/2019 1947   LYMPHSABS 1.0 05/16/2014 1154   MONOABS 0.1 09/30/2019 1947   MONOABS 0.2 05/16/2014 1154   EOSABS 0.3 09/30/2019 1947   EOSABS 0.0 05/16/2014 1154   BASOSABS 0.0 09/30/2019 1947   BASOSABS 0.1 05/16/2014 1154    CMP  Latest Ref Rng & Units 10/02/2019 10/01/2019 09/30/2019  Glucose 70 - 99 mg/dL 82 115(H) 113(H)  BUN 8 - 23 mg/dL 24(H) 17 17  Creatinine 0.44 - 1.00 mg/dL 1.31(H) 1.56(H) 1.54(H)  Sodium 135 - 145 mmol/L 143 141 141  Potassium 3.5 - 5.1 mmol/L 3.2(L) 4.6 3.8  Chloride 98 - 111 mmol/L 114(H) 108 104  CO2 22 - 32 mmol/L 20(L) 21(L) 21(L)  Calcium 8.9 - 10.3 mg/dL 8.3(L) 8.7(L) 10.2  Total Protein 6.5 - 8.1 g/dL 5.0(L) 5.5(L) 6.5  Total Bilirubin 0.3 - 1.2 mg/dL 1.8(H) 2.1(H) 2.2(H)  Alkaline Phos 38 - 126 U/L 522(H) 615(H) 900(H)  AST 15 - 41 U/L 378(H) 415(H) 358(H)  ALT 0 - 44 U/L 201(H) 220(H) 218(H)      Microbiology: Recent Results (from the past 240 hour(s))  SARS CORONAVIRUS 2 (TAT 6-24 HRS) Nasopharyngeal Nasopharyngeal Swab     Status: None   Collection Time: 09/26/19 11:21 AM   Specimen: Nasopharyngeal Swab  Result Value Ref Range Status   SARS Coronavirus 2 NEGATIVE NEGATIVE Final    Comment: (NOTE) SARS-CoV-2 target nucleic acids are NOT DETECTED.  The SARS-CoV-2 RNA is generally detectable in upper and lower respiratory specimens during the acute phase of infection. Negative results do not preclude SARS-CoV-2 infection, do not rule out co-infections with other pathogens, and should not be used as the sole basis for treatment or other patient management decisions. Negative results must be combined with clinical observations, patient history, and epidemiological information. The expected result is Negative.  Fact Sheet for Patients: SugarRoll.be  Fact Sheet for Healthcare Providers: https://www.woods-mathews.com/  This test is not yet approved or cleared by the [place] FDA and  has been authorized for detection and/or diagnosis of SARS-CoV-2 by FDA under an Emergency Use Authorization (EUA). This EUA will remain  in effect (meaning this test can be used) for the duration of the COVID-19 declaration under Se ction  564(b)(1) of the Act, 21 U.S.C. section 360bbb-3(b)(1), unless the authorization is terminated or revoked sooner.  Performed at [facility], [place] [address] [place], [place] [address]   SARS Coronavirus 2 by RT PCR (hospital order, performed in [facility] hospital lab) Nasopharyngeal Nasopharyngeal Swab     Status: None   Collection Time: 09/28/19 10:51 AM   Specimen: Nasopharyngeal Swab  Result Value Ref Range Status   SARS Coronavirus 2 NEGATIVE NEGATIVE Final    Comment: (NOTE) SARS-CoV-2 target nucleic acids are NOT DETECTED.  The SARS-CoV-2 RNA is generally detectable in upper and lower respiratory specimens during the acute phase of infection. The lowest concentration of SARS-CoV-2 viral copies this assay can detect is 250 copies / mL. A negative result does not preclude SARS-CoV-2 infection and should not be used as the sole basis for treatment or other patient management decisions.  A negative result may occur with improper specimen collection / handling, submission of specimen other than nasopharyngeal swab, presence of viral mutation(s) within the  areas targeted by this assay, and inadequate number of viral copies (<250 copies / mL). A negative result must be combined with clinical observations, patient history, and epidemiological information.  Fact Sheet for Patients:   StrictlyIdeas.no  Fact Sheet for Healthcare Providers: BankingDealers.co.za  This test is not yet approved or  cleared by the [place] FDA and has been authorized for detection and/or diagnosis of SARS-CoV-2 by FDA under an Emergency Use Authorization (EUA).  This EUA will remain in effect (meaning this test can be used) for the duration of the COVID-19 declaration under Section 564(b)(1) of the Act, 21 U.S.C. section 360bbb-3(b)(1), unless the authorization is terminated or revoked sooner.  Performed at [facility], [address] [place], [place] [address]   Blood culture (single)     Status: None (Preliminary result)   Collection Time: 09/30/19  7:47 PM   Specimen: BLOOD  Result Value Ref Range Status   Specimen Description BLOOD RIGHT [facility]  Final   Special Requests   Final    BOTTLES DRAWN AEROBIC AND ANAEROBIC Blood Culture results may not be optimal due to an excessive volume of blood received in culture bottles   Culture   Final    NO GROWTH 2 DAYS Performed at [facility], [address], [place], [place] [address]    Report Status PENDING  Incomplete  Culture, blood (single)     Status: None (Preliminary result)   Collection Time: 09/30/19  9:44 PM   Specimen: BLOOD  Result Value Ref Range Status   Specimen Description BLOOD RIGHT [facility]  Final   Special Requests   Final    BOTTLES DRAWN AEROBIC AND ANAEROBIC Blood Culture adequate volume   Culture   Final    NO GROWTH 2 DAYS Performed at [facility], [address], [place], [place] [address]    Report Status PENDING  Incomplete  SARS Coronavirus 2 by RT PCR (hospital order, performed in [place] hospital lab) Nasopharyngeal Nasopharyngeal Swab     Status: None   Collection Time: 10/01/19 12:38 AM   Specimen: Nasopharyngeal Swab  Result Value Ref Range Status   SARS Coronavirus 2 NEGATIVE NEGATIVE Final    Comment: (NOTE) SARS-CoV-2 target nucleic acids are NOT DETECTED.  The SARS-CoV-2 RNA is generally detectable in upper and lower respiratory specimens during the acute phase of infection. The lowest concentration of SARS-CoV-2 viral copies this assay can detect is 250 copies / mL. A negative result does not preclude SARS-CoV-2 infection and should not be used as the sole basis for treatment or other patient management decisions.  A negative result may occur with improper specimen collection / handling, submission of specimen other than nasopharyngeal swab, presence of viral mutation(s) within the areas targeted by  this assay, and inadequate number of viral copies (<250 copies / mL). A negative result must be combined with clinical observations, patient history, and epidemiological information.  Fact Sheet for Patients:   StrictlyIdeas.no  Fact Sheet for Healthcare Providers: BankingDealers.co.za  This test is not yet approved or  cleared by the [place] FDA and has been authorized for detection and/or diagnosis of SARS-CoV-2 by FDA under an Emergency Use Authorization (EUA).  This EUA will remain in effect (meaning this test can be used) for the duration of the COVID-19 declaration under Section 564(b)(1) of the Act, 21 U.S.C. section 360bbb-3(b)(1), unless the authorization is terminated or revoked sooner.  Performed at [facility], [place], [place]  [address]     IMAGING RESULTS:CT reviewed- see result above I have personally reviewed the films ? Impression/Recommendation ?Patient presenting with dizziness, weakness, near syncope and found to have hypotension, hypothermia,.  Her other abnormalities are abnormal LFTs, liver metastasis, bone mets  SIRS-like presentation.  Also has high lactate and high procalcitonin because of which infection has to be ruled out.  She has liver mets  Which can also increase lacttate with increased alkaline phosphatase.  There is no obvious biliary dilatation on CT scan.  May be an MR I abdomen could help.  Okay to continue cefepime and Flagyl for now. The differential diagnosis for this presentation is adrenal insufficiency.  Her cortisol levels were checked after getting hydrocortisone. Consider cosyntropin test as she is no longer on steroids now.   Metastatic breast cancer with liver and bone mets  AKI  Leucocytosis- resolved  B/L TKA  _Recent DKA secondary to steroids with admission to [facility] in June /July.  Also had Klebsiella bacteremia and Aerococcus bacteremia. Giant  cell arteritis needing Tocilizumab and high-dose steroids since May 2021.  Steroids have been tapered and she last was taking prednisone 79m  Not receiving any here-   insufficiency in her case.  Restart prednisone   CAD status post angioplasty and stent.  __________________________________________________ Discussed with patient, and her friend  Note:  This document was prepared using Dragon voice recognition software and may include unintentional dictation errors.

## 2019-10-03 ENCOUNTER — Encounter
Admission: EM | Disposition: A | Discharge status: Hospice | Payer: Self-pay | Source: Home / Self Care | Attending: Obstetrics and Gynecology

## 2019-10-03 ENCOUNTER — Inpatient Hospital Stay: Payer: Medicare Other

## 2019-10-03 LAB — BASIC METABOLIC PANEL
Anion gap: 10 (ref 5–15)
BUN: 25 mg/dL — ABNORMAL HIGH (ref 8–23)
CO2: 22 mmol/L (ref 22–32)
Calcium: 8.7 mg/dL — ABNORMAL LOW (ref 8.9–10.3)
Chloride: 114 mmol/L — ABNORMAL HIGH (ref 98–111)
Creatinine, Ser: 1.57 mg/dL — ABNORMAL HIGH (ref 0.44–1.00)
GFR calc Af Amer: 37 mL/min — ABNORMAL LOW (ref >60)
GFR calc non Af Amer: 32 mL/min — ABNORMAL LOW (ref >60)
Glucose, Bld: 119 mg/dL — ABNORMAL HIGH (ref 70–99)
Potassium: 3.9 mmol/L (ref 3.5–5.1)
Sodium: 146 mmol/L — ABNORMAL HIGH (ref 135–145)

## 2019-10-03 LAB — HEPATIC FUNCTION PANEL
ALT: 179 U/L — ABNORMAL HIGH (ref 0–44)
AST: 302 U/L — ABNORMAL HIGH (ref 15–41)
Albumin: 2.1 g/dL — ABNORMAL LOW (ref 3.5–5.0)
Alkaline Phosphatase: 579 U/L — ABNORMAL HIGH (ref 38–126)
Bilirubin, Direct: 1.2 mg/dL — ABNORMAL HIGH (ref 0.0–0.2)
Indirect Bilirubin: 0.9 mg/dL (ref 0.3–0.9)
Total Bilirubin: 2.1 mg/dL — ABNORMAL HIGH (ref 0.3–1.2)
Total Protein: 5 g/dL — ABNORMAL LOW (ref 6.5–8.1)

## 2019-10-03 LAB — CORTISOL-AM, BLOOD: Cortisol - AM: 13.8 ug/dL (ref 6.7–22.6)

## 2019-10-03 LAB — BRAIN NATRIURETIC PEPTIDE: B Natriuretic Peptide: 144.3 pg/mL — ABNORMAL HIGH (ref 0.0–100.0)

## 2019-10-03 LAB — PROCALCITONIN: Procalcitonin: 2.36 ng/mL

## 2019-10-03 SURGERY — PORTA CATH INSERTION
Anesthesia: Moderate Sedation

## 2019-10-03 MED ORDER — GADOBUTROL 1 MMOL/ML IV SOLN
9.0000 mL | Freq: Once | INTRAVENOUS | Status: AC | PRN
  Administered 2019-10-03: 9 mL via INTRAVENOUS

## 2019-10-03 MED ORDER — PREDNISONE 10 MG PO TABS
15.0000 mg | ORAL_TABLET | Freq: Every day | ORAL | Status: DC
  Administered 2019-10-03 – 2019-10-10 (×6): 15 mg via ORAL
  Filled 2019-10-03 (×8): qty 2

## 2019-10-03 NOTE — Care Management Important Message (Signed)
Important Message  Patient Details  Name: Faith [name] MRN: [id number] Date of Birth: 07/14/1945   Medicare Important Message Given:  Yes     Dannette Barbara 10/03/2019, 11:05 AM

## 2019-10-03 NOTE — Progress Notes (Signed)
Pharmacy Antibiotic Note  Faith [name] is a 74 y.o. female admitted on 09/30/2019 with sepsis. Upon admission patient was hypothermic (89.20F), hypotensive (72/75mmHg), HR 96 bpm. Lactic acid 5.9>4.4>1.8. Patient was started on cefepime and metronidazole. Vancomycin was started, but patient only received one dose then was d/c'd. Pharmacy has been consulted for cefepime dosing.  Day 4 IV abx, WBC 13.2>20.8>10, afebrile, Scr 1.56>1.31>1.57  9/12 US abdomen: Cholelithiasis without definite sonographic evidence for acute Cholecystitis. GI suggesting abdominal paracentesis to r/o peritonitis and source of sepsis. Initial plans were for paracentesis to rule out SBP, but deferred since pt does not have significant ascites to warrant paracentesis. Scr 0.97>1.54>1.56>1.31 ID following - differential diagnosis for pt's presentation is adrenal insufficiency  Plan: Continue cefepime 2g IV q12 hr Continue to monitor renal function and adjust doses per changes in renal function   Height: 5\' 7"  (170.2 cm) Weight: 90.9 kg (200 lb 6.4 oz) IBW/kg (Calculated) : 61.6  Temp (24hrs), Avg:98.6 F (37 C), Min:97.7 F (36.5 C), Max:99.1 F (37.3 C)  Recent Labs  Lab 09/28/19 1051 09/28/19 1051 09/28/19 1436 09/29/19 0451 09/30/19 1947 09/30/19 2144 10/01/19 0038 10/01/19 0602 10/02/19 0452 10/03/19 0406  WBC 10.6*  --   --  10.0 13.2*  --   --  20.8* 10.0  --   CREATININE 1.41*   < >  --  0.97 1.54*  --   --  1.56* 1.31* 1.57*  LATICACIDVEN 2.3*  --  1.8  --  5.9* 4.4* 3.1*  --   --   --    < > = values in this interval not displayed.    Estimated Creatinine Clearance: 35.8 mL/min (A) (by C-G formula based on SCr of 1.57 mg/dL (H)).    Allergies  Allergen Reactions  . Mushroom Extract Complex Hives and Swelling  . Nickel Rash  . Shellfish Allergy Nausea And Vomiting   Antibiotics since admission: 9/13 Vancomcyin x1 9/12 metronidazole >> 9/12 cefepime>>  Microbiology: 9/12 Bcx:  NGTD  Thank you for allowing pharmacy to be a part of this patient's care.  Sherilyn Banker, PharmD Pharmacy Resident  10/03/2019 6:46 AM

## 2019-10-03 NOTE — Progress Notes (Signed)
GI note  No further input from my end.  I will sign off.  Please call me if any further GI concerns or questions.  We would like to thank you for the opportunity to participate in the care of Faith [name].    Dr Jonathon Bellows MD,MRCP [facility]) Gastroenterology/Hepatology Pager: [phone]

## 2019-10-03 NOTE — Progress Notes (Signed)
PROGRESS NOTE    Faith [name] [facility]   MGQ:[id number]  DOB: 02-May-1945  PCP: Faith Ramsay, MD    DOA: 09/30/2019 LOS: 3   Brief Narrative   Faith [name], Faith [name], Faith [name], Faith [name], Faith [name] was admitted here on 9/10 and discharged yesterday when she was managed for hypotension and presyncope with acute kidney injury on stage IIIa chronic kidney [name]. She stated that she felt fine when she went home yesterday till this afternoon when she felt weak and dizzy and could not stand or walk. She admitted to associated abdominal pain epigastric and right upper quadrant area. She denied any nausea or vomiting or diarrhea. No fever or chills. No chest pain or dyspnea or cough or wheezing. No dysuria, oliguria, urgency or frequency or flank pain.  Upon presentation to the emergency room, Faith [name] was 89.75F/31.7C,blood pressure 72/44 with a heart rate of 96 respiratory to 24 and pulse 70 was 98% on room air.With clear bolus of IV normal saline her blood pressure was up to 130/48. Labs were remarkable for anion gap of 16, magnesium of 1.9 with a potassium of 3.8 BUN of 17 with a creatinine of 1.54 compared to 21 and 0.97 on 9/11, high-sensitivity troponin I 160 and later 111, AST 358 ALT of 218 and alk phos of 900 with total bili of 2.2 albumin of 3 and total protein of 6.5.The patient's alk phos was 669 on 9/10 with total bili of 2.3, AST is 225 and ALT 177. Lactic acid came back 5.9 and later down to 4.4 from 1.8 on 9/10. CBC showed leukocytosis of 13.2 with neutrophilia as well as thrombocytopenia of 135. WBCs were 10 on 9/11. At 500 mL D-dimer was more than 7500, Tylenol level 10 and salicylate less than 7. TSH was 4.2 and free T4 1.37 both slightly elevated. Blood cultures were  drawn. EKG showed sinus rhythm with rate of 98 with occasional PVCs and possible left atrial enlargement. Right upper quadrant ultrasound revealed the following: 1. Cholelithiasis without definite sonographic evidence for acute cholecystitis. 2. Mildly thickened gallbladder wall which is nonspecific but can be seen in patients with underlying hepatocellular [name]. 3. Innumerable hepatic masses are again noted, consistent with metastatic [name].  The patient was given IV cefepime and Flagyl, IV Zofran and 3 L bolus of IV normal saline. She will be admitted to a progressive unit bed for further evaluation and management.   Assessment & Plan   Active Problems:   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 female, estrogen receptor positive ([place])   Elevated LFTs   Bone metastasis (HCC)   Liver metastasis (HCC)   HLD (hyperlipidemia)   Acute renal failure superimposed on stage 3a chronic kidney [name] (HCC)   Sepsis (HCC)   Leukocytosis   1. SIRS.  No clear source of infection at this time.  She did present with hypotension, elevated lactate, hypothermia and tachypnea.  No evidence of pneumonia on chest imaging.  She is on broad-spectrum antibiotics.  Blood cultures negative to date.  Overall hemodynamics have improved with IV fluids. Appreciate ID recs, noticed was not continued hom home prednisone, will re-start that. AM cortisol wnl and hemodynamics have improved, will hold on suggested acth stim test for now.  Initial plans were for paracentesis to rule out SBP, but she does not have significant ascites to warrant paracentesis.  2. Elevated liver enzymes.  Initially, based on imaging there was concern for cholecystitis.  Seen by general surgery high-dose felt unlikely that patient had cholecystitis and they have signed off.  She is also being seen by GI and it is thought that her elevated liver enzymes likely related to underlying cancer.  If they do not improve, can potentially  consider MRCP. They have signed off.  3. Acute kidney injury on chronic kidney [name] stage III.  Likely related to hypotension.  Continue IV fluids and monitor renal function. 4. Left breast cancer with metastases to liver and bone.  Oncology consulted. Plan to start chemotherapy soon. Port to be placed today but needs to wait until infection ruled out. 5. Hyperlipidemia.  Holding statin in light of elevated liver enzymes 6. Leukocytosis.  Possibly reactive to #1.  She also received a dose of intravenous steroids while she was hypotensive.  She is currently afebrile.  Continue to monitor. 7. Hx GCA - resume home prednisone 8. History of Faith [name].  Antihypertensives currently on hold in light of recent hypotension.       Obesity: Body mass index is 31.39 kg/m.  Complicates overall care and prognosis.  DVT prophylaxis: enoxaparin (LOVENOX) injection 40 mg Start: 10/01/19 0000   Diet:  Diet Orders (From admission, onward)    Start     Ordered   10/03/19 1258  Diet Heart Room service appropriate? Yes; Fluid consistency: Thin  Diet effective now       Question Answer Comment  Room service appropriate? Yes   Fluid consistency: Thin      10/03/19 1257            Code Status: Full Code    Subjective 10/03/19    Patient seen at bedside this AM.  She reports back pain overnight.  Has some mild nausea but no vomiting.  Some RUQ pain at times.  Is hoping to have port placed soon for chemotherapy, understands need to rule out infection first.     Disposition Plan & Communication   Status is: Inpatient  Remains inpatient appropriate because:Ongoing diagnostic testing needed not appropriate for outpatient work up   Dispo: The patient is from: Home              Anticipated d/c is to: Home              Anticipated d/c date is: 2 days              Patient currently is not medically stable to d/c.        Family Communication: none at bedside, will attempt to call     Consults, Procedures, Significant Events   Consultants:   Oncology  ID  GI  Surgery  Procedures:   none  Antimicrobials:  Anti-infectives (From admission, onward)   Start     Dose/Rate Route Frequency Ordered Stop   10/02/19 0200  vancomycin (VANCOCIN) IVPB 1000 mg/200 mL premix  Status:  Discontinued        1,000 mg 200 mL/hr over 60 Minutes Intravenous Every 24 hours 10/01/19 0316 10/01/19 1208   10/01/19 1000  ceFEPIme (MAXIPIME) 2 g in sodium chloride 0.9 % 100 mL IVPB        2 g 200 mL/hr over 30 Minutes Intravenous Every 12 hours 09/30/19 2356     10/01/19 0600  metroNIDAZOLE (FLAGYL) IVPB 500 mg        500 mg 100 mL/hr over 60 Minutes Intravenous Every 8 hours  09/30/19 2356     10/01/19 0000  vancomycin (VANCOREADY) IVPB 1750 mg/350 mL        1,750 mg 175 mL/hr over 120 Minutes Intravenous  Once 09/30/19 2356 10/01/19 0426   09/30/19 2045  ceFEPIme (MAXIPIME) 2 g in sodium chloride 0.9 % 100 mL IVPB        2 g 200 mL/hr over 30 Minutes Intravenous  Once 09/30/19 2041 09/30/19 2157   09/30/19 2045  metroNIDAZOLE (FLAGYL) IVPB 500 mg        500 mg 100 mL/hr over 60 Minutes Intravenous  Once 09/30/19 2041 09/30/19 2257         Objective   Vitals:   10/03/19 0508 10/03/19 0544 10/03/19 0846 10/03/19 1205  BP: 131/61  (!) 134/57 (!) 145/57  Pulse: (!) 102  95 80  Resp: _0 Temp: 97.7 F (36.5 C)  98.4 F (36.9 C) 98.5 F (36.9 C)  TempSrc: Oral   Oral  SpO2: 94%  94% 96%  Weight:  90.9 kg    Height:        Intake/Output Summary (Last 24 hours) at 10/03/2019 1352 Last data filed at 10/03/2019 1008 Gross per 24 hour  Intake 1202.8 ml  Output 300 ml  Net 902.8 ml   Filed Weights   09/30/19 1932 09/30/19 1950 10/03/19 0544  Weight: 88.9 kg 88.9 kg 90.9 kg    Physical Exam:  General exam: awake, alert, no acute distress Respiratory system: CTAB, normal respiratory effort. Cardiovascular system: normal S1/S2, RRR, chronic lymphedema.    Gastrointestinal system: soft, protuberant but not distended, +bowel sounds. Central nervous system: A&O x4. no gross focal neurologic deficits, normal speech Extremities: moves all, lyphedema, normal tone Psychiatry: normal mood, congruent affect, judgement and insight appear normal  Labs   Data Reviewed: I have personally reviewed following labs and imaging studies  CBC: Recent Labs  Lab 09/28/19 1051 09/29/19 0451 09/30/19 1947 10/01/19 0602 10/02/19 0452  WBC 10.6* 10.0 13.2* 20.8* 10.0  NEUTROABS 9.0*  --  12.5*  --   --   HGB 13.2 11.4* 14.0 12.1 11.0*  HCT 39.1 34.2* 42.0 35.2* 31.7*  MCV 85.6 87.7 87.1 85.6 86.4  PLT 151 119* 135* 111* 619*   Basic Metabolic Panel: Recent Labs  Lab 09/29/19 0451 09/30/19 1947 10/01/19 0602 10/02/19 0452 10/03/19 0406  NA 140 141 141 143 146*  K 3.9 3.8 4.6 3.2* 3.9  CL 107 104 108 114* 114*  CO2 22 21* 21* 20* 22  GLUCOSE 122* 113* 115* 82 119*  BUN _1 24* 25*  CREATININE 0.97 1.54* 1.56* 1.31* 1.57*  CALCIUM 9.2 10.2 8.7* 8.3* 8.7*  MG  --  1.9  --   --   --   PHOS  --  2.3*  --   --   --    GFR: Estimated Creatinine Clearance: 35.8 mL/min (A) (by C-G formula based on SCr of 1.57 mg/dL (H)). Liver Function Tests: Recent Labs  Lab 09/28/19 1051 09/30/19 1947 10/01/19 0602 10/02/19 0452  AST 225* 358* 415* 378*  ALT 177* 218* 220* 201*  ALKPHOS 669* 900* 615* 522*  BILITOT 2.3* 2.2* 2.1* 1.8*  PROT 6.3* 6.5 5.5* 5.0*  ALBUMIN 3.1* 3.0* 2.4* 2.1*   Recent Labs  Lab 09/30/19 1947  LIPASE 73*   No results for input(s): AMMONIA in the last 168 hours. Coagulation Profile: Recent Labs  Lab 10/01/19 0602  INR 1.1   Cardiac Enzymes: No  results for input(s): CKTOTAL, CKMB, CKMBINDEX, TROPONINI in the last 168 hours. BNP (last 3 results) No results for input(s): PROBNP in the last 8760 hours. HbA1C: No results for input(s): HGBA1C in the last 72 hours. CBG: Recent Labs  Lab 09/28/19 2203  09/29/19 0855 10/01/19 0723 10/01/19 1135 10/01/19 1557  GLUCAP 171* 100* 118* 130* 190*   Lipid Profile: No results for input(s): CHOL, HDL, LDLCALC, TRIG, CHOLHDL, LDLDIRECT in the last 72 hours. Thyroid Function Tests: Recent Labs    09/30/19 1947  TSH 5.203*  FREET4 1.37*   Anemia Panel: No results for input(s): VITAMINB12, FOLATE, FERRITIN, TIBC, IRON, RETICCTPCT in the last 72 hours. Sepsis Labs: Recent Labs  Lab 09/28/19 1436 09/30/19 1947 09/30/19 2144 10/01/19 0038 10/01/19 0602 10/03/19 0406  PROCALCITON  --   --   --   --  7.07 2.36  LATICACIDVEN 1.8 5.9* 4.4* 3.1*  --   --     Recent Results (from the past 240 hour(s))  SARS CORONAVIRUS 2 (TAT 6-24 HRS) Nasopharyngeal Nasopharyngeal Swab     Status: None   Collection Time: 09/26/19 11:21 AM   Specimen: Nasopharyngeal Swab  Result Value Ref Range Status   SARS Coronavirus 2 NEGATIVE NEGATIVE Final    Comment: (NOTE) SARS-CoV-2 target nucleic acids are NOT DETECTED.  The SARS-CoV-2 RNA is generally detectable in upper and lower respiratory specimens during the acute phase of infection. Negative results do not preclude SARS-CoV-2 infection, do not rule out co-infections with other pathogens, and should not be used as the sole basis for treatment or other patient management decisions. Negative results must be combined with clinical observations, patient history, and epidemiological information. The expected result is Negative.  Fact Sheet for Patients: SugarRoll.be  Fact Sheet for Healthcare Providers: https://www.woods-mathews.com/  This test is not yet approved or cleared by the [place] FDA and  has been authorized for detection and/or diagnosis of SARS-CoV-2 by FDA under an Emergency Use Authorization (EUA). This EUA will remain  in effect (meaning this test can be used) for the duration of the COVID-19 declaration under Se ction 564(b)(1) of the Act,  21 U.S.C. section 360bbb-3(b)(1), unless the authorization is terminated or revoked sooner.  Performed at [facility], [place] [address] [place], [place] [address]   SARS Coronavirus 2 by RT PCR (hospital order, performed in [facility] hospital lab) Nasopharyngeal Nasopharyngeal Swab     Status: None   Collection Time: 09/28/19 10:51 AM   Specimen: Nasopharyngeal Swab  Result Value Ref Range Status   SARS Coronavirus 2 NEGATIVE NEGATIVE Final    Comment: (NOTE) SARS-CoV-2 target nucleic acids are NOT DETECTED.  The SARS-CoV-2 RNA is generally detectable in upper and lower respiratory specimens during the acute phase of infection. The lowest concentration of SARS-CoV-2 viral copies this assay can detect is 250 copies / mL. A negative result does not preclude SARS-CoV-2 infection and should not be used as the sole basis for treatment or other patient management decisions.  A negative result may occur with improper specimen collection / handling, submission of specimen other than nasopharyngeal swab, presence of viral mutation(s) within the areas targeted by this assay, and inadequate number of viral copies (<250 copies / mL). A negative result must be combined with clinical observations, patient history, and epidemiological information.  Fact Sheet for Patients:   StrictlyIdeas.no  Fact Sheet for Healthcare Providers: BankingDealers.co.za  This test is not yet approved or  cleared by the [place] FDA and has  been authorized for detection and/or diagnosis of SARS-CoV-2 by FDA under an Emergency Use Authorization (EUA).  This EUA will remain in effect (meaning this test can be used) for the duration of the COVID-19 declaration under Section 564(b)(1) of the Act, 21 U.S.C. section 360bbb-3(b)(1), unless the authorization is terminated or revoked sooner.  Performed at [facility], [address]  [place], [place] [address]   Blood culture (single)     Status: None (Preliminary result)   Collection Time: 09/30/19  7:47 PM   Specimen: BLOOD  Result Value Ref Range Status   Specimen Description BLOOD RIGHT [facility]  Final   Special Requests   Final    BOTTLES DRAWN AEROBIC AND ANAEROBIC Blood Culture results may not be optimal due to an excessive volume of blood received in culture bottles   Culture   Final    NO GROWTH 3 DAYS Performed at [facility], [address], [place], [place] [address]    Report Status PENDING  Incomplete  Culture, blood (single)     Status: None (Preliminary result)   Collection Time: 09/30/19  9:44 PM   Specimen: BLOOD  Result Value Ref Range Status   Specimen Description BLOOD RIGHT [facility]  Final   Special Requests   Final    BOTTLES DRAWN AEROBIC AND ANAEROBIC Blood Culture adequate volume   Culture   Final    NO GROWTH 3 DAYS Performed at [facility], [address], [place], [place] [address]    Report Status PENDING  Incomplete  SARS Coronavirus 2 by RT PCR (hospital order, performed in [place] hospital lab) Nasopharyngeal Nasopharyngeal Swab     Status: None   Collection Time: 10/01/19 12:38 AM   Specimen: Nasopharyngeal Swab  Result Value Ref Range Status   SARS Coronavirus 2 NEGATIVE NEGATIVE Final    Comment: (NOTE) SARS-CoV-2 target nucleic acids are NOT DETECTED.  The SARS-CoV-2 RNA is generally detectable in upper and lower respiratory specimens during the acute phase of infection. The lowest concentration of SARS-CoV-2 viral copies this assay can detect is 250 copies / mL. A negative result does not preclude SARS-CoV-2 infection and should not be used as the sole basis for treatment or other patient management decisions.  A negative result may occur with improper specimen collection / handling, submission of specimen other than nasopharyngeal swab, presence of viral mutation(s) within the areas targeted by this assay,  and inadequate number of viral copies (<250 copies / mL). A negative result must be combined with clinical observations, patient history, and epidemiological information.  Fact Sheet for Patients:   StrictlyIdeas.no  Fact Sheet for Healthcare Providers: BankingDealers.co.za  This test is not yet approved or  cleared by the [place] FDA and has been authorized for detection and/or diagnosis of SARS-CoV-2 by FDA under an Emergency Use Authorization (EUA).  This EUA will remain in effect (meaning this test can be used) for the duration of the COVID-19 declaration under Section 564(b)(1) of the Act, 21 U.S.C. section 360bbb-3(b)(1), unless the authorization is terminated or revoked sooner.  Performed at [facility], [address] [place], [place] [address]       Imaging Studies   No results found.   Medications   Scheduled Meds: . vitamin C  1,000 mg Oral Daily  . cholecalciferol  5,000 Units Oral BID  . enoxaparin (LOVENOX) injection  40 mg Subcutaneous Q24H  . predniSONE  15 mg Oral Daily   Continuous Infusions: . sodium  chloride 75 mL/hr at 10/03/19 0052  . ceFEPime (MAXIPIME) IV 2 g (10/03/19 0846)  . metronidazole 500 mg (10/03/19 0502)       LOS: 3 days    Time spent: 30 minutes    Desma Maxim, MD [facility]  10/03/2019, 1:52 PM    If 7PM-7AM, please contact night-coverage. How to contact the [facility] Attending or Consulting provider [place] or covering provider during after hours [place], for this patient?    1. Check the care team in [facility] and look for a) attending/consulting [facility] provider listed and b) the [facility] team listed 2. Log into [email or web address] and use [place]'s universal password to access. If you do not have the password, please contact the hospital operator. 3. Locate the [facility] provider you are looking for under [facility] and page to a number that you can be directly  reached. 4. If you still have difficulty reaching the provider, please page the [facility] (Director on Call) for the Hospitalists listed on amion for assistance.

## 2019-10-03 NOTE — Progress Notes (Signed)
ID Pt says she feels weak Pain rt side of abdomen Patient Vitals for the past 24 hrs:  BP Temp Temp src Pulse Resp SpO2 Weight  10/03/19 1205 (!) 145/57 98.5 F (36.9 C) Oral 80 18 96 % --  10/03/19 0846 (!) 134/57 98.4 F (36.9 C) -- 95 18 94 % --  10/03/19 0544 -- -- -- -- -- -- 90.9 kg  10/03/19 0508 131/61 97.7 F (36.5 C) Oral (!) 102 17 94 % --  10/02/19 2019 140/60 99.1 F (37.3 C) Oral 92 16 94 % --  10/02/19 1618 (!) 147/60 99.1 F (37.3 C) Oral 86 16 92 % --  fatigued Chronically ill Chest b/l air entry Hepatomegaly Hss1s2 CNS non focal labs  CBC Latest Ref Rng & Units 10/02/2019 10/01/2019 09/30/2019  WBC 4.0 - 10.5 K/uL 10.0 20.8(H) 13.2(H)  Hemoglobin 12.0 - 15.0 g/dL 11.0(L) 12.1 14.0  Hematocrit 36 - 46 % 31.7(L) 35.2(L) 42.0  Platelets 150 - 400 K/uL 102(L) 111(L) 135(L)    CMP Latest Ref Rng & Units 10/03/2019 10/02/2019 10/01/2019  Glucose 70 - 99 mg/dL 119(H) 82 115(H)  BUN 8 - 23 mg/dL 25(H) 24(H) 17  Creatinine 0.44 - 1.00 mg/dL 1.57(H) 1.31(H) 1.56(H)  Sodium 135 - 145 mmol/L 146(H) 143 141  Potassium 3.5 - 5.1 mmol/L 3.9 3.2(L) 4.6  Chloride 98 - 111 mmol/L 114(H) 114(H) 108  CO2 22 - 32 mmol/L 22 20(L) 21(L)  Calcium 8.9 - 10.3 mg/dL 8.7(L) 8.3(L) 8.7(L)  Total Protein 6.5 - 8.1 g/dL - 5.0(L) 5.5(L)  Total Bilirubin 0.3 - 1.2 mg/dL - 1.8(H) 2.1(H)  Alkaline Phos 38 - 126 U/L - 522(H) 615(H)  AST 15 - 41 U/L - 378(H) 415(H)  ALT 0 - 44 U/L - 201(H) 220(H)    Impression/recommendation Patient presenting with dizziness, weakness, near syncope and found to have hypotension, hypothermia,.  Her other abnormalities are abnormal LFTs, liver metastasis, bone mets  SIRS-like presentation.  Also has high lactate and high procalcitonin because of which infection has to be ruled out.  She has liver mets  Which can also increase lactate with increased alkaline phosphatase.  There is no obvious biliary dilatation on CT scan.  recommend MRI/MRCP  If it does not  show any intrahepatic biliary dilatation can then  DC antibiotics  The differential diagnosis for this presentation is adrenal insufficiency.  Her cortisol level today was okay  Metastatic breast cancer with liver and bone mets-   AKI  Leucocytosis- resolved  B/L TKA  _Recent DKA secondary to steroids with admission to [facility] in June /July.  Also had Klebsiella bacteremia and Aerococcus bacteremia. Giant cell arteritis needing Tocilizumab and high-dose steroids since May 2021.  Steroids have been tapered and she last was taking prednisone 15mg   Not receiving any here-   insufficiency in her case.  Restart prednisone   CAD status post angioplasty and stent.  Discussed the management with care team

## 2019-10-04 ENCOUNTER — Other Ambulatory Visit (INDEPENDENT_AMBULATORY_CARE_PROVIDER_SITE_OTHER): Payer: Self-pay | Admitting: Vascular Surgery

## 2019-10-04 DIAGNOSIS — R6520 Severe sepsis without septic shock: Secondary | ICD-10-CM

## 2019-10-04 LAB — COMPREHENSIVE METABOLIC PANEL
ALT: 172 U/L — ABNORMAL HIGH (ref 0–44)
AST: 287 U/L — ABNORMAL HIGH (ref 15–41)
Albumin: 2.2 g/dL — ABNORMAL LOW (ref 3.5–5.0)
Alkaline Phosphatase: 681 U/L — ABNORMAL HIGH (ref 38–126)
Anion gap: 11 (ref 5–15)
BUN: 24 mg/dL — ABNORMAL HIGH (ref 8–23)
CO2: 18 mmol/L — ABNORMAL LOW (ref 22–32)
Calcium: 8.8 mg/dL — ABNORMAL LOW (ref 8.9–10.3)
Chloride: 113 mmol/L — ABNORMAL HIGH (ref 98–111)
Creatinine, Ser: 1.39 mg/dL — ABNORMAL HIGH (ref 0.44–1.00)
GFR calc Af Amer: 43 mL/min — ABNORMAL LOW (ref >60)
GFR calc non Af Amer: 37 mL/min — ABNORMAL LOW (ref >60)
Glucose, Bld: 103 mg/dL — ABNORMAL HIGH (ref 70–99)
Potassium: 3.6 mmol/L (ref 3.5–5.1)
Sodium: 142 mmol/L (ref 135–145)
Total Bilirubin: 3.1 mg/dL — ABNORMAL HIGH (ref 0.3–1.2)
Total Protein: 5.2 g/dL — ABNORMAL LOW (ref 6.5–8.1)

## 2019-10-04 LAB — ACTH: C206 ACTH: <1.5 pg/mL — ABNORMAL LOW (ref 7.2–63.3)

## 2019-10-04 MED ORDER — OXYCODONE HCL 5 MG PO TABS
5.0000 mg | ORAL_TABLET | Freq: Four times a day (QID) | ORAL | Status: DC | PRN
  Administered 2019-10-04 – 2019-10-10 (×6): 5 mg via ORAL
  Filled 2019-10-04 (×6): qty 1

## 2019-10-04 MED ORDER — METOPROLOL TARTRATE 25 MG PO TABS: 25.0000 mg | ORAL_TABLET | Freq: Two times a day (BID) | ORAL | Status: DC

## 2019-10-04 MED ORDER — SODIUM CHLORIDE 0.9 % IV SOLN: INTRAVENOUS | Status: DC

## 2019-10-04 NOTE — Evaluation (Signed)
Occupational Therapy Evaluation Patient Details Name: Faith [name] MRN: [id number] DOB: May 02, 1945 Today's Date: 10/04/2019    History of Present Illness 74 y.o. female with a known history of coronary artery disease, type diabetes mellitus, hypertension, dyslipidemia, presented to the emergency room with acute onset of generalized weakness and lightheadedness as well as abdominal pain.  He was admitted here on 9/10 and discharged yesterday when she was managed for hypotension and presyncope with acute kidney injury on stage IIIa chronic kidney disease.  She stated that she felt fine when she went home yesterday till this afternoon when she felt weak and dizzy and could not stand or walk.  She admitted to associated abdominal pain epigastric and right upper quadrant area.  She denied any nausea or vomiting or diarrhea.  No fever or chills.  No chest pain or dyspnea or cough or wheezing.  No dysuria, oliguria, urgency or frequency or flank pain   Clinical Impression   Patient presenting with decreased I in self care,balance, functional mobility/transfers, endurance, and safety awareness. Patient reports being mod I PTA and lives with husband. Pt with recent hospital discharge but reports feeling very weak very quickly. Pt intially not wanting to transfer this session but was incontinent in bed and agree to transfer to commode to finish needs. Pt very fatigued once returning to bed. Patient currently functioning at min - mod (depends on level of fatigue) A. Patient will benefit from acute OT to increase overall independence in the areas of ADLs, functional mobility, and safety awareness in order to safely discharge to next venue of care.    Follow Up Recommendations  SNF;Supervision/Assistance - 24 hour    Equipment Recommendations  Other (comment) (defer to next venue of care)       Precautions / Restrictions Precautions Precautions: Fall      Mobility Bed Mobility Overal bed mobility: Needs  Assistance Bed Mobility: Rolling;Supine to Sit;Sit to Supine Rolling: Min guard   Supine to sit: Min guard Sit to supine: Min guard   General bed mobility comments: min cuing for hand placement and technique  Transfers Overall transfer level: Needs assistance Equipment used: Rolling walker (2 wheeled) Transfers: Sit to/from [facility] Sit to Stand: Min assist Stand pivot transfers: Min assist         Balance Overall balance assessment: Needs assistance Sitting-balance support: Feet supported Sitting balance-Leahy Scale: Good       Standing balance-Leahy Scale: Fair Standing balance comment: reliance on RW       ADL either performed or assessed with clinical judgement   ADL Overall ADL's : Needs assistance/impaired         Toilet Transfer: Min guard;RW;BSC   Toileting- Clothing Manipulation and Hygiene: Moderate assistance         General ADL Comments: Pt having BM in bed and encouraged to transfer onto [facility]. Pt required min guard with use of RW to transfer. Pt needing assistance with hygiene after successful BM.     Vision Baseline Vision/History: No visual deficits Patient Visual Report: No change from baseline Vision Assessment?: No apparent visual deficits            Pertinent Vitals/Pain Pain Assessment: Faces Faces Pain Scale: Hurts whole lot Pain Location: generalized "all over" Pain Descriptors / Indicators: Discomfort;Guarding Pain Intervention(s): Limited activity within patient's tolerance;Monitored during session;Premedicated before session;Repositioned     Hand Dominance Right   Extremity/Trunk Assessment Upper Extremity Assessment Upper Extremity Assessment: Generalized weakness   Lower Extremity Assessment Lower  Extremity Assessment: Defer to PT evaluation   Cervical / Trunk Assessment Cervical / Trunk Assessment: Normal   Communication Communication Communication: No difficulties   Cognition Arousal/Alertness:  Awake/alert Behavior During Therapy: WFL for tasks assessed/performed Overall Cognitive Status: Within Functional Limits for tasks assessed                    Home Living Family/patient expects to be discharged to:: Private residence Living Arrangements: Spouse/significant other Available Help at Discharge: Family Type of Home: House Home Access: Stairs to enter [employer] of Steps: 2 in front and 6 in back Entrance Stairs-Rails: None Home Layout: One level     Bathroom Shower/Tub: Occupational psychologist: Standard Bathroom Accessibility: Yes How Accessible: Accessible via walker Home Equipment: Toilet riser;Cane - single point;Walker - 2 wheels;Grab bars - tub/shower;Hand held shower head          Prior Functioning/Environment Level of Independence: Independent with assistive device(s)                 OT Problem List: Decreased strength;Decreased activity tolerance;Decreased safety awareness;Impaired balance (sitting and/or standing);Decreased knowledge of use of DME or AE      OT Treatment/Interventions: Self-care/ADL training;Therapeutic exercise;Therapeutic activities;Energy conservation;Patient/family education;Balance training;DME and/or AE instruction    OT Goals(Current goals can be found in the care plan section) Acute Rehab OT Goals Patient Stated Goal: to go home OT Goal Formulation: With patient Time For Goal Achievement: 10/18/19 Potential to Achieve Goals: Good ADL Goals Pt Will Perform Grooming: with modified independence;standing Pt Will Transfer to Toilet: with modified independence;ambulating Pt Will Perform Toileting - Clothing Manipulation and hygiene: with modified independence;sit to/from stand  OT Frequency: Min 1X/week   Barriers to D/C: Other (comment)  none known at this time          AM-PAC OT "6 Clicks" Daily Activity     Outcome Measure Help from another person eating meals?: None Help from another  person taking care of personal grooming?: A Little Help from another person toileting, which includes using toliet, bedpan, or urinal?: A Lot Help from another person bathing (including washing, rinsing, drying)?: A Little Help from another person to put on and taking off regular upper body clothing?: A Little Help from another person to put on and taking off regular lower body clothing?: A Lot 6 Click Score: 17   End of Session Equipment Utilized During Treatment: Rolling walker Nurse Communication: Mobility status  Activity Tolerance: Patient tolerated treatment well Patient left: in bed;with call bell/phone within reach;with bed alarm set  OT Visit Diagnosis: Muscle weakness (generalized) (M62.81)                Time: 0370-4888 OT Time Calculation (min): 27 min Charges:  OT General Charges $OT Visit: 1 Visit OT Evaluation $OT Eval Low Complexity: 1 Low OT Treatments $Self Care/Home Management : 8-22 mins  Darleen Crocker, MS, OTR/L , CBIS ascom [phone]  10/04/19, 11:15 AM

## 2019-10-04 NOTE — Progress Notes (Signed)
Mobility Specialist - Progress Note   10/04/19 1555  Mobility  Activity Stood at bedside (ambulated 2 steps forward and back from bedside)  Level of Assistance Minimal assist, patient does 75% or more  Assistive Device Front wheel walker  Distance Ambulated (ft) 4 ft  Mobility Response Tolerated well  Mobility performed by Mobility specialist  $Mobility charge 1 Mobility    Pre-mobility: 84 HR, 146/66 BP, 96% SpO2 Post-mobility: 88 HR, 159/71 BP, 96% SpO2   Pt long-sitting in bed w/ husband present in room upon arrival. Pt agreed to session. Pt able to transition from long-sitting in bed to sitting EOB w/ min. Assist. Pt needed mod. Assist for S2S using a RW. Standing at bedside, pt performed marching-in-place (10 reps total x 2 sets). Pt progressed to taking 2 steps forward and 2 steps backward from bedside. Further mobility limited d/t pt feeling weak and fatigue. Pt able to transition from stand-to-sit and sit-to-supine CGA. Overall, pt tolerated session very well. Pt left long-sitting in bed w/ husband at bedside. Call bell and phone placed in reach. Nurse was notified.    Esthela Brandner Mobility Specialist  10/04/19, 4:02 PM

## 2019-10-04 NOTE — Progress Notes (Signed)
PROGRESS NOTE    Faith [name] [facility]   VXB:[id number]  DOB: 11/11/1945  PCP: Leonel Ramsay, MD    DOA: 09/30/2019 LOS: 4   Brief Narrative   Faith [name] a19 y.o.femalewith a known history of coronary artery disease, type diabetes mellitus, hypertension, dyslipidemia, [name] She stated that she felt fine when she went home yesterday till this afternoon when she felt weak and dizzy and could not stand or walk. She admitted to associated abdominal pain epigastric and right upper quadrant area. She denied any nausea or vomiting or diarrhea. No fever or chills. No chest pain or dyspnea or cough or wheezing. No dysuria, oliguria, urgency or frequency or flank pain.  Upon presentation to the emergency room, hypertension was 89.16F/31.7C,blood pressure 72/44 with a heart rate of 96 respiratory to 24 and pulse 70 was 98% on room air.With clear bolus of IV normal saline her blood pressure was up to 130/48. Labs were remarkable for anion gap of 16, magnesium of 1.9 with a potassium of 3.8 BUN of 17 with a creatinine of 1.54 compared to 21 and 0.97 on 9/11, high-sensitivity troponin I 160 and later 111, AST 358 ALT of 218 and alk phos of 900 with total bili of 2.2 albumin of 3 and total protein of 6.5.The patient's alk phos was 669 on 9/10 with total bili of 2.3, AST is 225 and ALT 177. Lactic acid came back 5.9 and later down to 4.4 from 1.8 on 9/10. CBC showed leukocytosis of 13.2 with neutrophilia as well as thrombocytopenia of 135. WBCs were 10 on 9/11. At 500 mL D-dimer was more than 7500, Tylenol level 10 and salicylate less than 7. TSH was 4.2 and free T4 1.37 both slightly elevated. Blood cultures were  drawn. EKG showed sinus rhythm with rate of 98 with occasional PVCs and possible left atrial enlargement. Right upper quadrant ultrasound revealed the following: 1. Cholelithiasis without definite sonographic evidence for acute cholecystitis. 2. Mildly thickened gallbladder wall which is nonspecific but can be seen in patients with underlying hepatocellular disease. 3. Innumerable hepatic masses are again noted, consistent with metastatic disease.  The patient was given IV cefepime and Flagyl, IV Zofran and 3 L bolus of IV normal saline. She will be admitted to a progressive unit bed for further evaluation and management.   Assessment & Plan   Active Problems:   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 female, estrogen receptor positive ([place])   Elevated LFTs   Bone metastasis (HCC)   Liver metastasis (HCC)   HLD (hyperlipidemia)   Acute renal failure superimposed on stage 3a chronic kidney disease (HCC)   Sepsis (HCC)   Leukocytosis   1. SIRS.  No clear source of infection at this time.  She did present with hypotension, elevated lactate, hypothermia and tachypnea.  No evidence of pneumonia on chest imaging.  She is on broad-spectrum antibiotics.  Blood cultures negative to date.  Overall hemodynamics have improved with IV fluids. Appreciate ID recs, noticed was not continued hom home prednisone, have restarted that. AM cortisol wnl and hemodynamics have improved, will hold on suggested acth stim test for now.  Initial plans were for paracentesis to rule out SBP, but she does not have significant ascites to warrant paracentesis.  MRCP yesterday w/o signs infection. 2. Elevated liver enzymes.  Initially, based on imaging there was concern for cholecystitis.  Seen by general surgery high-dose felt unlikely that patient had cholecystitis and they have signed off.  She is also being seen by GI and it is thought that her elevated liver enzymes likely related to underlying  cancer. 3. Acute kidney injury on chronic kidney disease stage III.  Likely related to hypotension.  resolved 4. Left breast cancer with metastases to liver and bone.  Oncology consulted. Plan to start chemotherapy soon. Will confirm w/ ID that they are OK w/ port being placed and attempt to get that done prior to d/c 5. CAD.  Holding statin in light of elevated liver enzymes. Holding home metoprolol 6. Leukocytosis.  Possibly reactive to #1.  She also received a dose of intravenous steroids while she was hypotensive.  She is currently afebrile.  Continue to monitor. 7. Hx GCA - resumed home prednisone 8. History of hypertension.  Antihypertensives currently on hold in light of recent hypotension.       Obesity: Body mass index is 34.24 kg/m.  Complicates overall care and prognosis.  DVT prophylaxis: enoxaparin (LOVENOX) injection 40 mg Start: 10/01/19 0000   Diet:  Diet Orders (From admission, onward)    Start     Ordered   10/03/19 1258  Diet Heart Room service appropriate? Yes; Fluid consistency: Thin  Diet effective now       Question Answer Comment  Room service appropriate? Yes   Fluid consistency: Thin      10/03/19 1257            Code Status: Full Code    Subjective 10/04/19    Patient seen at bedside this AM.  She reports back pain overnight.  No nausea/vomiting. No new complaints. No fevers.   Disposition Plan & Communication   Status is: Inpatient  Remains inpatient appropriate because:Ongoing diagnostic testing needed not appropriate for outpatient work up   Dispo: The patient is from: Home              Anticipated d/c is to: Home              Anticipated d/c date is: 1-2 days              Patient currently is not medically stable to d/c.        Family Communication: husband updated @ bedside 9/15   Consults, Procedures, Significant Events   Consultants:   Oncology  ID  GI (signed off)  Surgery (signed off)  Procedures:    none  Antimicrobials:  Anti-infectives (From admission, onward)   Start     Dose/Rate Route Frequency Ordered Stop   10/02/19 0200  vancomycin (VANCOCIN) IVPB 1000 mg/200 mL premix  Status:  Discontinued        1,000 mg 200 mL/hr over 60 Minutes Intravenous Every 24 hours 10/01/19 0316 10/01/19 1208   10/01/19 1000  ceFEPIme (MAXIPIME) 2 g in sodium chloride 0.9 % 100 mL IVPB        2 g 200 mL/hr over 30 Minutes Intravenous Every 12 hours 09/30/19 2356     10/01/19 0600  metroNIDAZOLE (FLAGYL) IVPB 500 mg        500 mg 100 mL/hr over 60 Minutes Intravenous Every 8 hours 09/30/19 2356     10/01/19 0000  vancomycin (VANCOREADY) IVPB 1750 mg/350 mL        1,750 mg 175 mL/hr over 120 Minutes  Intravenous  Once 09/30/19 2356 10/01/19 0426   09/30/19 2045  ceFEPIme (MAXIPIME) 2 g in sodium chloride 0.9 % 100 mL IVPB        2 g 200 mL/hr over 30 Minutes Intravenous  Once 09/30/19 2041 09/30/19 2157   09/30/19 2045  metroNIDAZOLE (FLAGYL) IVPB 500 mg        500 mg 100 mL/hr over 60 Minutes Intravenous  Once 09/30/19 2041 09/30/19 2257         Objective   Vitals:   10/04/19 0033 10/04/19 0546 10/04/19 0720 10/04/19 1132  BP: (!) 146/61 (!) 147/65 (!) 155/61 (!) 138/54  Pulse: (!) 102 87 83 82  Resp: '20 19 18 18  ' Temp: 99 F (37.2 C) 98.2 F (36.8 C) 98 F (36.7 C) 97.7 F (36.5 C)  TempSrc: Oral Oral Oral Oral  SpO2: 93% 95% 97% 96%  Weight:  99.2 kg    Height:        Intake/Output Summary (Last 24 hours) at 10/04/2019 1359 Last data filed at 10/04/2019 1133 Gross per 24 hour  Intake 2537.92 ml  Output 800 ml  Net 1737.92 ml   Filed Weights   09/30/19 1950 10/03/19 0544 10/04/19 0546  Weight: 88.9 kg 90.9 kg 99.2 kg    Physical Exam:  General exam: awake, alert, no acute distress Respiratory system: CTAB, normal respiratory effort. Cardiovascular system: normal S1/S2, RRR, chronic lymphedema.   Gastrointestinal system: soft, protuberant but not distended, +bowel  sounds. Central nervous system: A&O x4. no gross focal neurologic deficits, normal speech Extremities: moves all, lyphedema, normal tone Psychiatry: normal mood, congruent affect, judgement and insight appear normal  Labs   Data Reviewed: I have personally reviewed following labs and imaging studies  CBC: Recent Labs  Lab 09/28/19 1051 09/29/19 0451 09/30/19 1947 10/01/19 0602 10/02/19 0452  WBC 10.6* 10.0 13.2* 20.8* 10.0  NEUTROABS 9.0*  --  12.5*  --   --   HGB 13.2 11.4* 14.0 12.1 11.0*  HCT 39.1 34.2* 42.0 35.2* 31.7*  MCV 85.6 87.7 87.1 85.6 86.4  PLT 151 119* 135* 111* 665*   Basic Metabolic Panel: Recent Labs  Lab 09/30/19 1947 10/01/19 0602 10/02/19 0452 10/03/19 0406 10/04/19 0605  NA 141 141 143 146* 142  K 3.8 4.6 3.2* 3.9 3.6  CL 104 108 114* 114* 113*  CO2 21* 21* 20* 22 18*  GLUCOSE 113* 115* 82 119* 103*  BUN 17 17 24* 25* 24*  CREATININE 1.54* 1.56* 1.31* 1.57* 1.39*  CALCIUM 10.2 8.7* 8.3* 8.7* 8.8*  MG 1.9  --   --   --   --   PHOS 2.3*  --   --   --   --    GFR: Estimated Creatinine Clearance: 42.3 mL/min (A) (by C-G formula based on SCr of 1.39 mg/dL (H)). Liver Function Tests: Recent Labs  Lab 09/30/19 1947 10/01/19 0602 10/02/19 0452 10/03/19 0406 10/04/19 0605  AST 358* 415* 378* 302* 287*  ALT 218* 220* 201* 179* 172*  ALKPHOS 900* 615* 522* 579* 681*  BILITOT 2.2* 2.1* 1.8* 2.1* 3.1*  PROT 6.5 5.5* 5.0* 5.0* 5.2*  ALBUMIN 3.0* 2.4* 2.1* 2.1* 2.2*   Recent Labs  Lab 09/30/19 1947  LIPASE 73*   No results for input(s): AMMONIA in the last 168 hours. Coagulation Profile: Recent Labs  Lab 10/01/19 0602  INR 1.1   Cardiac Enzymes: No results for input(s): CKTOTAL, CKMB, CKMBINDEX, TROPONINI in the last 168 hours. BNP (last 3 results)  No results for input(s): PROBNP in the last 8760 hours. HbA1C: No results for input(s): HGBA1C in the last 72 hours. CBG: Recent Labs  Lab 09/28/19 2203 09/29/19 0855 10/01/19 0723  10/01/19 1135 10/01/19 1557  GLUCAP 171* 100* 118* 130* 190*   Lipid Profile: No results for input(s): CHOL, HDL, LDLCALC, TRIG, CHOLHDL, LDLDIRECT in the last 72 hours. Thyroid Function Tests: No results for input(s): TSH, T4TOTAL, FREET4, T3FREE, THYROIDAB in the last 72 hours. Anemia Panel: No results for input(s): VITAMINB12, FOLATE, FERRITIN, TIBC, IRON, RETICCTPCT in the last 72 hours. Sepsis Labs: Recent Labs  Lab 09/28/19 1436 09/30/19 1947 09/30/19 2144 10/01/19 0038 10/01/19 0602 10/03/19 0406  PROCALCITON  --   --   --   --  7.07 2.36  LATICACIDVEN 1.8 5.9* 4.4* 3.1*  --   --     Recent Results (from the past 240 hour(s))  SARS CORONAVIRUS 2 (TAT 6-24 HRS) Nasopharyngeal Nasopharyngeal Swab     Status: None   Collection Time: 09/26/19 11:21 AM   Specimen: Nasopharyngeal Swab  Result Value Ref Range Status   SARS Coronavirus 2 NEGATIVE NEGATIVE Final    Comment: (NOTE) SARS-CoV-2 target nucleic acids are NOT DETECTED.  The SARS-CoV-2 RNA is generally detectable in upper and lower respiratory specimens during the acute phase of infection. Negative results do not preclude SARS-CoV-2 infection, do not rule out co-infections with other pathogens, and should not be used as the sole basis for treatment or other patient management decisions. Negative results must be combined with clinical observations, patient history, and epidemiological information. The expected result is Negative.  Fact Sheet for Patients: SugarRoll.be  Fact Sheet for Healthcare Providers: https://www.woods-mathews.com/  This test is not yet approved or cleared by the [place] FDA and  has been authorized for detection and/or diagnosis of SARS-CoV-2 by FDA under an Emergency Use Authorization (EUA). This EUA will remain  in effect (meaning this test can be used) for the duration of the COVID-19 declaration under Se ction 564(b)(1) of the Act, 21  U.S.C. section 360bbb-3(b)(1), unless the authorization is terminated or revoked sooner.  Performed at [facility], [place] [address], [place], [place] [address]   SARS Coronavirus 2 by RT PCR (hospital order, performed in [facility] hospital lab) Nasopharyngeal Nasopharyngeal Swab     Status: None   Collection Time: 09/28/19 10:51 AM   Specimen: Nasopharyngeal Swab  Result Value Ref Range Status   SARS Coronavirus 2 NEGATIVE NEGATIVE Final    Comment: (NOTE) SARS-CoV-2 target nucleic acids are NOT DETECTED.  The SARS-CoV-2 RNA is generally detectable in upper and lower respiratory specimens during the acute phase of infection. The lowest concentration of SARS-CoV-2 viral copies this assay can detect is 250 copies / mL. A negative result does not preclude SARS-CoV-2 infection and should not be used as the sole basis for treatment or other patient management decisions.  A negative result may occur with improper specimen collection / handling, submission of specimen other than nasopharyngeal swab, presence of viral mutation(s) within the areas targeted by this assay, and inadequate number of viral copies (<250 copies / mL). A negative result must be combined with clinical observations, patient history, and epidemiological information.  Fact Sheet for Patients:   StrictlyIdeas.no  Fact Sheet for Healthcare Providers: BankingDealers.co.za  This test is not yet approved or  cleared by the [place] FDA and has been authorized for detection and/or diagnosis of SARS-CoV-2 by FDA under an Emergency Use Authorization (EUA).  This EUA will remain in effect (meaning this test can be used) for the duration of the COVID-19 declaration under Section 564(b)(1) of the Act, 21 U.S.C. section 360bbb-3(b)(1), unless the authorization is terminated or revoked sooner.  Performed at [facility], [address] [place], [place]  [address]   Blood culture (single)     Status: None (Preliminary result)   Collection Time: 09/30/19  7:47 PM   Specimen: BLOOD  Result Value Ref Range Status   Specimen Description BLOOD RIGHT [facility]  Final   Special Requests   Final    BOTTLES DRAWN AEROBIC AND ANAEROBIC Blood Culture results may not be optimal due to an excessive volume of blood received in culture bottles   Culture   Final    NO GROWTH 4 DAYS Performed at [facility], [address] [place], [place] [address]    Report Status PENDING  Incomplete  Culture, blood (single)     Status: None (Preliminary result)   Collection Time: 09/30/19  9:44 PM   Specimen: BLOOD  Result Value Ref Range Status   Specimen Description BLOOD RIGHT [facility]  Final   Special Requests   Final    BOTTLES DRAWN AEROBIC AND ANAEROBIC Blood Culture adequate volume   Culture   Final    NO GROWTH 4 DAYS Performed at [facility], [address] [place], [place] [address]    Report Status PENDING  Incomplete  SARS Coronavirus 2 by RT PCR (hospital order, performed in [place] hospital lab) Nasopharyngeal Nasopharyngeal Swab     Status: None   Collection Time: 10/01/19 12:38 AM   Specimen: Nasopharyngeal Swab  Result Value Ref Range Status   SARS Coronavirus 2 NEGATIVE NEGATIVE Final    Comment: (NOTE) SARS-CoV-2 target nucleic acids are NOT DETECTED.  The SARS-CoV-2 RNA is generally detectable in upper and lower respiratory specimens during the acute phase of infection. The lowest concentration of SARS-CoV-2 viral copies this assay can detect is 250 copies / mL. A negative result does not preclude SARS-CoV-2 infection and should not be used as the sole basis for treatment or other patient management decisions.  A negative result may occur with improper specimen collection / handling, submission of specimen other than nasopharyngeal swab, presence of viral mutation(s) within the areas targeted by this assay, and inadequate  number of viral copies (<250 copies / mL). A negative result must be combined with clinical observations, patient history, and epidemiological information.  Fact Sheet for Patients:   StrictlyIdeas.no  Fact Sheet for Healthcare Providers: BankingDealers.co.za  This test is not yet approved or  cleared by the [place] FDA and has been authorized for detection and/or diagnosis of SARS-CoV-2 by FDA under an Emergency Use Authorization (EUA).  This EUA will remain in effect (meaning this test can be used) for the duration of the COVID-19 declaration under Section 564(b)(1) of the Act, 21 U.S.C. section 360bbb-3(b)(1), unless the authorization is terminated or revoked sooner.  Performed at [facility], [place],  [address]       Imaging Studies   MR 3D Recon At Scanner  Result Date: 10/04/2019 CLINICAL DATA:  Abdominal pain. Metastatic hepatic disease. EXAM: MRI ABDOMEN WITHOUT AND WITH CONTRAST (INCLUDING MRCP) TECHNIQUE: Multiplanar multisequence MR imaging of the abdomen was performed both before and after the administration of intravenous contrast. Heavily T2-weighted images of the biliary and pancreatic ducts were obtained, and three-dimensional MRCP images were rendered by post processing. CONTRAST:  10m GADAVIST GADOBUTROL 1 MMOL/ML IV SOLN COMPARISON:  CT scan 09/30/2019 and 09/28/2019 FINDINGS: Lower chest: The lung bases are grossly clear. Small bilateral pleural effusions and overlying atelectasis. Hepatobiliary: Diffuse/extensive metastatic disease throughout both lobes of the liver. The gallbladder contains sludge in gallstones and is slightly distended but no obvious wall thickening or pericholecystic fluid. Normal caliber and course of the common bile duct. Pancreas:  No mass, inflammation or ductal dilatation. Spleen:  Normal size. No focal lesions. Adrenals/Urinary Tract: No obvious adrenal  gland metastasis. Kidneys are unremarkable. Stable cysts. Stomach/Bowel: Visualized portions within the abdomen are unremarkable. Vascular/Lymphatic: No pathologically enlarged lymph nodes identified. No abdominal aortic aneurysm demonstrated. Other:  Small volume abdominal ascites. Musculoskeletal: Diffuse sclerotic metastatic bone disease. IMPRESSION: 1. Diffuse/extensive metastatic disease throughout both lobes of the liver. 2. Gallbladder sludge and gallstones but no definite findings for acute cholecystitis. 3. Normal caliber and course of the common bile duct. 4. No abdominal lymphadenopathy. 5. Diffuse sclerotic metastatic bone disease. 6. Small bilateral pleural effusions and overlying atelectasis. Electronically Signed   By: PMarijo SanesM.D.   On: 10/04/2019 05:31   MR ABDOMEN MRCP W WO CONTAST  Result Date: 10/04/2019 CLINICAL DATA:  Abdominal pain. Metastatic hepatic disease. EXAM: MRI ABDOMEN WITHOUT AND WITH CONTRAST (INCLUDING MRCP) TECHNIQUE: Multiplanar multisequence MR imaging of the abdomen was performed both before and after the administration of intravenous contrast. Heavily T2-weighted images of the biliary and pancreatic ducts were obtained, and three-dimensional MRCP images were rendered by post processing. CONTRAST:  915mGADAVIST GADOBUTROL 1 MMOL/ML IV SOLN COMPARISON:  CT scan 09/30/2019 and 09/28/2019 FINDINGS: Lower chest: The lung bases are grossly clear. Small bilateral pleural effusions and overlying atelectasis. Hepatobiliary: Diffuse/extensive metastatic disease throughout both lobes of the liver. The gallbladder contains sludge in gallstones and is slightly distended but no obvious wall thickening or pericholecystic fluid. Normal caliber and course of the common bile duct. Pancreas:  No mass, inflammation or ductal dilatation. Spleen:  Normal size. No focal lesions. Adrenals/Urinary Tract: No obvious adrenal gland metastasis. Kidneys are unremarkable. Stable cysts.  Stomach/Bowel: Visualized portions within the abdomen are unremarkable. Vascular/Lymphatic: No pathologically enlarged lymph nodes identified. No abdominal aortic aneurysm demonstrated. Other:  Small volume abdominal ascites. Musculoskeletal: Diffuse sclerotic metastatic bone disease. IMPRESSION: 1. Diffuse/extensive metastatic disease throughout both lobes of the liver. 2. Gallbladder sludge and gallstones but no definite findings for acute cholecystitis. 3. Normal caliber and course of the common bile duct. 4. No abdominal lymphadenopathy. 5. Diffuse sclerotic metastatic bone disease. 6. Small bilateral pleural effusions and overlying atelectasis. Electronically Signed   By: P.Marijo Sanes.D.   On: 10/04/2019 05:31     Medications   Scheduled Meds: . vitamin C  1,000 mg Oral Daily  . cholecalciferol  5,000 Units Oral BID  . enoxaparin (LOVENOX) injection  40 mg Subcutaneous Q24H  . metoprolol tartrate  25 mg Oral BID  . predniSONE  15 mg Oral Daily   Continuous Infusions: . sodium chloride 75 mL/hr at 10/04/19 0637  . ceFEPime (MAXIPIME) IV 2 g (10/04/19 1029)  . metronidazole 500 mg (10/04/19 0907)       LOS: 4 days    Time spent: 30 minutes    NoDesma MaximMD [facility]  10/04/2019, 1:59 PM    If 7PM-7AM, please contact night-coverage. How to contact the [facility] or Consulting provider 7AMelvinar covering provider during after hours 7PMillerfor this patient?    1. Check the  care team in [facility] and look for a) attending/consulting [place] provider listed and b) the [facility] team listed 2. Log into [email or web address] and use [place]'s universal password to access. If you do not have the password, please contact the hospital operator. 3. Locate the [facility] provider you are looking for under [facility] and page to a number that you can be directly reached. 4. If you still have difficulty reaching the provider, please page the [facility] (Director on Call) for the Hospitalists  listed on amion for assistance.

## 2019-10-04 NOTE — Evaluation (Signed)
Physical Therapy Evaluation Patient Details Name: Faith [name] MRN: [id number] DOB: 02/04/1945 Today's Date: 10/04/2019   History of Present Illness  74 y.o. female with a known history of coronary artery disease, type diabetes mellitus, hypertension, dyslipidemia, presented to the emergency room with acute onset of generalized weakness and lightheadedness as well as abdominal pain.  He was admitted here on 9/10 and discharged yesterday when she was managed for hypotension and presyncope with acute kidney injury on stage IIIa chronic kidney disease.  She stated that she felt fine when she went home yesterday till this afternoon when she felt weak and dizzy and could not stand or walk.  She admitted to associated abdominal pain epigastric and right upper quadrant area.  She denied any nausea or vomiting or diarrhea.  No fever or chills.  No chest pain or dyspnea or cough or wheezing.  No dysuria, oliguria, urgency or frequency or flank pain  Clinical Impression  Pt was lethargic t/o much of the PT exam, but willing to work and do what she could.  Ultimately she was functionally very limited, especially with standing activity and tolerance.  She was unable to do more than a few shuffle steps along EOB before needing to collapse back onto bed with fatigue.  Overall she showed good effort but is very far from her baseline and confirms that she agrees about needing rehab before being able to safely go home.      Follow Up Recommendations SNF    Equipment Recommendations  None recommended by PT    Recommendations for Other Services       Precautions / Restrictions Precautions Precautions: Fall Restrictions Weight Bearing Restrictions: No      Mobility  Bed Mobility Overal bed mobility: Needs Assistance Bed Mobility: Supine to Sit;Sit to Supine Rolling: Min guard   Supine to sit: Min guard Sit to supine: Min assist   General bed mobility comments: Pt labored and reliant on rail but able  to get herself slowly to sitting EOB, did need light assist to get LEs back up into bed after minimal standing activity  Transfers Overall transfer level: Needs assistance Equipment used: Rolling walker (2 wheeled) Transfers: Sit to/from Stand Sit to Stand: Min assist Stand pivot transfers: Min assist       General transfer comment: Pt showed good effort with getting to standing, did not need excessive assist but was very reliant on UEs and clearly fatigued with the modest effort  Ambulation/Gait Ambulation/Gait assistance: Min assist   Assistive device: Rolling walker (2 wheeled)       General Gait Details: Pt was able to take a few side shuffle steps R and L but was not safe/stable enough to get away from EOB and after this very modest effort she essentially crashed back onto bed reporting excessive fatigue  Stairs            Wheelchair Mobility    Modified Rankin (Stroke Patients Only)       Balance Overall balance assessment: Needs assistance Sitting-balance support: Feet supported Sitting balance-Leahy Scale: Good       Standing balance-Leahy Scale: Fair Standing balance comment: reliant on walker, and poor tolerance/quickly to fatigue in standing                             Pertinent Vitals/Pain Pain Assessment:  (reports she was having a lot of pain, minimal now with meds) Pain Scale: 2/10 Pain Location:  generalized "all over" Pain Descriptors / Indicators: Discomfort;Guarding Pain Intervention(s): Limited activity within patient's tolerance;Monitored during session;Premedicated before session;Repositioned    Home Living Family/patient expects to be discharged to:: Skilled nursing facility Living Arrangements: Spouse/significant other Available Help at Discharge: Family Type of Home: House Home Access: Stairs to enter Entrance Stairs-Rails: None Entrance Stairs-Number of Steps: 2 in front and 6 in back Home Layout: One level Home  Equipment: Toilet riser;Cane - single point;Walker - 2 wheels;Grab bars - tub/shower;Hand held shower head      Prior Function Level of Independence: Independent with assistive device(s)         Comments: Pt reports she rarely uses walker in the home, but always uses one out in the community.  No falls in the last 6 months.     Hand Dominance   Dominant Hand: Right    Extremity/Trunk Assessment   Upper Extremity Assessment Upper Extremity Assessment: Generalized weakness    Lower Extremity Assessment Lower Extremity Assessment: Generalized weakness    Cervical / Trunk Assessment Cervical / Trunk Assessment: Normal  Communication   Communication: No difficulties  Cognition Arousal/Alertness: Lethargic;Suspect due to medications Behavior During Therapy: [facility] for tasks assessed/performed Overall Cognitive Status: Within Functional Limits for tasks assessed                                        General Comments General comments (skin integrity, edema, etc.): Pt reports that meds can often make her lethargic and feels this is the case during PT exam, however in a larger sense she is clearly much weaker than her baseline regardless of medication    Exercises     Assessment/Plan    PT Assessment Patient needs continued PT services  PT Problem List Decreased strength;Decreased range of motion;Decreased activity tolerance;Decreased balance;Decreased mobility;Decreased cognition;Decreased knowledge of use of DME;Decreased safety awareness;Pain       PT Treatment Interventions Gait training;DME instruction;Functional mobility training;Therapeutic activities;Therapeutic exercise;Balance training;Neuromuscular re-education;Patient/family education    PT Goals (Current goals can be found in the Care Plan section)  Acute Rehab PT Goals Patient Stated Goal: get strong enough to go home PT Goal Formulation: With patient Time For Goal Achievement:  10/18/19 Potential to Achieve Goals: Good    Frequency Min 2X/week   Barriers to discharge        Co-evaluation               AM-PAC PT "6 Clicks" Mobility  Outcome Measure Help needed turning from your back to your side while in a flat bed without using bedrails?: A Little Help needed moving from lying on your back to sitting on the side of a flat bed without using bedrails?: A Little Help needed moving to and from a bed to a chair (including a wheelchair)?: A Little Help needed standing up from a chair using your arms (e.g., wheelchair or bedside chair)?: A Little Help needed to walk in hospital room?: A Lot Help needed climbing 3-5 steps with a railing? : A Lot 6 Click Score: 16    End of Session   Activity Tolerance: Patient limited by fatigue;Patient limited by lethargy Patient left: with bed alarm set;with call bell/phone within reach Nurse Communication: Mobility status PT Visit Diagnosis: Muscle weakness (generalized) (M62.81);Difficulty in walking, not elsewhere classified (R26.2)    Time: 9323-5573 PT Time Calculation (min) (ACUTE ONLY): 17 min   Charges:   PT  Evaluation $PT Eval Low Complexity: 1 Low          Kreg Shropshire, DPT 10/04/2019, 1:09 PM

## 2019-10-04 NOTE — Progress Notes (Signed)
Pharmacy Antibiotic Note  Faith [name] is a 74 y.o. female admitted on 09/30/2019 with sepsis. Upon admission patient was hypothermic (89.63F), hypotensive (72/29mmHg), HR 96 bpm. Lactic acid 5.9>4.4>1.8. Patient was started on cefepime and metronidazole. Vancomycin was started, but patient only received one dose then was d/c'd. Pharmacy has been consulted for cefepime dosing.  Day 5 IV abx, WBC 13.2>20.8>10, afebrile, Scr 1.56>1.31>1.57>1.39  9/12 US abdomen: Cholelithiasis without definite sonographic evidence for acute Cholecystitis. GI suggesting abdominal paracentesis to r/o peritonitis and source of sepsis. Initial plans were for paracentesis to rule out SBP, but deferred since pt does not have significant ascites to warrant paracentesis. Scr 0.97>1.54>1.56>1.31 ID following - differential diagnosis for pt's presentation is adrenal insufficiency; ID is recommending MRI/MRCP and if it does not show any intrahepatic biliary dilatation can then DC antibiotics  Plan: Continue cefepime 2g IV q12 hr Continue to monitor renal function and adjust doses per changes in renal function   Height: 5\' 7"  (170.2 cm) Weight: 99.2 kg (218 lb 9.6 oz) IBW/kg (Calculated) : 61.6  Temp (24hrs), Avg:98.5 F (36.9 C), Min:98 F (36.7 C), Max:99 F (37.2 C)  Recent Labs  Lab 09/28/19 1051 09/28/19 1051 09/28/19 1436 09/29/19 0451 09/29/19 0451 09/30/19 1947 09/30/19 2144 10/01/19 0038 10/01/19 0602 10/02/19 0452 10/03/19 0406 10/04/19 0605  WBC 10.6*  --   --  10.0  --  13.2*  --   --  20.8* 10.0  --   --   CREATININE 1.41*   < >  --  0.97   < > 1.54*  --   --  1.56* 1.31* 1.57* 1.39*  LATICACIDVEN 2.3*  --  1.8  --   --  5.9* 4.4* 3.1*  --   --   --   --    < > = values in this interval not displayed.    Estimated Creatinine Clearance: 42.3 mL/min (A) (by C-G formula based on SCr of 1.39 mg/dL (H)).    Allergies  Allergen Reactions  . Mushroom Extract Complex Hives and Swelling  .  Nickel Rash  . Shellfish Allergy Nausea And Vomiting   Antibiotics since admission: 9/13 Vancomcyin x1 9/12 metronidazole >> 9/12 cefepime>>  Microbiology: 9/12 Bcx: NGTD  Thank you for allowing pharmacy to be a part of this patient's care.  Sherilyn Banker, PharmD Pharmacy Resident  10/04/2019 9:20 AM

## 2019-10-05 ENCOUNTER — Other Ambulatory Visit: Payer: Self-pay | Admitting: Hematology and Oncology

## 2019-10-05 ENCOUNTER — Encounter
Admission: EM | Disposition: A | Discharge status: Hospice | Payer: Self-pay | Source: Home / Self Care | Attending: Obstetrics and Gynecology

## 2019-10-05 ENCOUNTER — Inpatient Hospital Stay: Payer: Medicare Other

## 2019-10-05 VITALS — BP 142/57 | HR 73 | Resp 17

## 2019-10-05 DIAGNOSIS — C787 Secondary malignant neoplasm of liver and intrahepatic bile duct: Secondary | ICD-10-CM

## 2019-10-05 DIAGNOSIS — C50919 Malignant neoplasm of unspecified site of unspecified female breast: Secondary | ICD-10-CM

## 2019-10-05 DIAGNOSIS — Z515 Encounter for palliative care: Secondary | ICD-10-CM

## 2019-10-05 DIAGNOSIS — C7951 Secondary malignant neoplasm of bone: Secondary | ICD-10-CM

## 2019-10-05 DIAGNOSIS — C50812 Malignant neoplasm of overlapping sites of left female breast: Secondary | ICD-10-CM

## 2019-10-05 HISTORY — PX: PORTA CATH INSERTION: CATH118285

## 2019-10-05 LAB — COMPREHENSIVE METABOLIC PANEL
ALT: 171 U/L — ABNORMAL HIGH (ref 0–44)
AST: 311 U/L — ABNORMAL HIGH (ref 15–41)
Albumin: 2.1 g/dL — ABNORMAL LOW (ref 3.5–5.0)
Alkaline Phosphatase: 754 U/L — ABNORMAL HIGH (ref 38–126)
Anion gap: 12 (ref 5–15)
BUN: 23 mg/dL (ref 8–23)
CO2: 16 mmol/L — ABNORMAL LOW (ref 22–32)
Calcium: 9 mg/dL (ref 8.9–10.3)
Chloride: 114 mmol/L — ABNORMAL HIGH (ref 98–111)
Creatinine, Ser: 1.26 mg/dL — ABNORMAL HIGH (ref 0.44–1.00)
GFR calc Af Amer: 48 mL/min — ABNORMAL LOW (ref >60)
GFR calc non Af Amer: 42 mL/min — ABNORMAL LOW (ref >60)
Glucose, Bld: 88 mg/dL (ref 70–99)
Potassium: 4.1 mmol/L (ref 3.5–5.1)
Sodium: 142 mmol/L (ref 135–145)
Total Bilirubin: 3.3 mg/dL — ABNORMAL HIGH (ref 0.3–1.2)
Total Protein: 5.2 g/dL — ABNORMAL LOW (ref 6.5–8.1)

## 2019-10-05 LAB — CULTURE, BLOOD (SINGLE)
Culture: NO GROWTH
Culture: NO GROWTH
Special Requests: ADEQUATE

## 2019-10-05 LAB — CBC
HCT: 38.4 % (ref 36.0–46.0)
Hemoglobin: 13.1 g/dL (ref 12.0–15.0)
MCH: 28.8 pg (ref 26.0–34.0)
MCHC: 34.1 g/dL (ref 30.0–36.0)
MCV: 84.4 fL (ref 80.0–100.0)
Platelets: 123 10*3/uL — ABNORMAL LOW (ref 150–400)
RBC: 4.55 MIL/uL (ref 3.87–5.11)
RDW: 17 % — ABNORMAL HIGH (ref 11.5–15.5)
WBC: 16.5 10*3/uL — ABNORMAL HIGH (ref 4.0–10.5)
nRBC: 1.8 % — ABNORMAL HIGH (ref 0.0–0.2)

## 2019-10-05 LAB — CBC WITH DIFFERENTIAL/PLATELET
Abs Immature Granulocytes: 1.79 10*3/uL — ABNORMAL HIGH (ref 0.00–0.07)
Basophils Absolute: 0.1 10*3/uL (ref 0.0–0.1)
Basophils Relative: 1 %
Eosinophils Absolute: 0 10*3/uL (ref 0.0–0.5)
Eosinophils Relative: 0 %
HCT: 40.7 % (ref 36.0–46.0)
Hemoglobin: 13.6 g/dL (ref 12.0–15.0)
Immature Granulocytes: 11 %
Lymphocytes Relative: 9 %
Lymphs Abs: 1.4 10*3/uL (ref 0.7–4.0)
MCH: 29.1 pg (ref 26.0–34.0)
MCHC: 33.4 g/dL (ref 30.0–36.0)
MCV: 87.2 fL (ref 80.0–100.0)
Monocytes Absolute: 0.8 10*3/uL (ref 0.1–1.0)
Monocytes Relative: 5 %
Neutro Abs: 11.6 10*3/uL — ABNORMAL HIGH (ref 1.7–7.7)
Neutrophils Relative %: 74 %
Platelets: 110 10*3/uL — ABNORMAL LOW (ref 150–400)
RBC: 4.67 MIL/uL (ref 3.87–5.11)
RDW: 16.7 % — ABNORMAL HIGH (ref 11.5–15.5)
Smear Review: NORMAL
WBC: 15.7 10*3/uL — ABNORMAL HIGH (ref 4.0–10.5)
nRBC: 2.3 % — ABNORMAL HIGH (ref 0.0–0.2)

## 2019-10-05 SURGERY — PORTA CATH INSERTION
Anesthesia: Moderate Sedation

## 2019-10-05 MED ORDER — DIPHENHYDRAMINE HCL 50 MG/ML IJ SOLN: 50.0000 mg | Freq: Once | INTRAMUSCULAR | Status: DC | PRN

## 2019-10-05 MED ORDER — FAMOTIDINE IN NACL 20-0.9 MG/50ML-% IV SOLN
20.0000 mg | Freq: Once | INTRAVENOUS | Status: AC
  Administered 2019-10-05: 20 mg via INTRAVENOUS

## 2019-10-05 MED ORDER — FENTANYL CITRATE (PF) 100 MCG/2ML IJ SOLN
INTRAMUSCULAR | Status: DC | PRN
Start: 2019-10-05 — End: 2019-10-05
  Administered 2019-10-05 (×2): 50 ug via INTRAVENOUS

## 2019-10-05 MED ORDER — CEFAZOLIN SODIUM-DEXTROSE 2-4 GM/100ML-% IV SOLN
2.0000 g | Freq: Once | INTRAVENOUS | Status: AC
  Filled 2019-10-05: qty 100

## 2019-10-05 MED ORDER — SODIUM CHLORIDE 0.9 % IV SOLN
30.0000 mg/m2 | Freq: Once | INTRAVENOUS | Status: AC
  Administered 2019-10-05: 60 mg via INTRAVENOUS
  Filled 2019-10-05: qty 10

## 2019-10-05 MED ORDER — CHLORHEXIDINE GLUCONATE CLOTH 2 % EX PADS
6.0000 | MEDICATED_PAD | Freq: Every day | CUTANEOUS | Status: DC
  Administered 2019-10-05: 2 via TOPICAL

## 2019-10-05 MED ORDER — CEFAZOLIN SODIUM-DEXTROSE 2-4 GM/100ML-% IV SOLN
INTRAVENOUS | Status: AC
  Administered 2019-10-05: 2 g via INTRAVENOUS
  Filled 2019-10-05: qty 100

## 2019-10-05 MED ORDER — MIDAZOLAM HCL 2 MG/2ML IJ SOLN
INTRAMUSCULAR | Status: AC
  Filled 2019-10-05: qty 2

## 2019-10-05 MED ORDER — SODIUM CHLORIDE 0.9 % IV SOLN
10.0000 mg | Freq: Once | INTRAVENOUS | Status: AC
  Administered 2019-10-05: 10 mg via INTRAVENOUS
  Filled 2019-10-05: qty 10

## 2019-10-05 MED ORDER — MIDAZOLAM HCL 2 MG/2ML IJ SOLN
INTRAMUSCULAR | Status: DC | PRN
  Administered 2019-10-05: 2 mg via INTRAVENOUS
  Administered 2019-10-05: 1 mg via INTRAVENOUS

## 2019-10-05 MED ORDER — DIPHENHYDRAMINE HCL 50 MG/ML IJ SOLN
50.0000 mg | Freq: Once | INTRAMUSCULAR | Status: AC
  Administered 2019-10-05: 50 mg via INTRAVENOUS

## 2019-10-05 MED ORDER — ONDANSETRON HCL 4 MG/2ML IJ SOLN
4.0000 mg | Freq: Four times a day (QID) | INTRAMUSCULAR | Status: DC | PRN
  Administered 2019-10-08 – 2019-10-09 (×2): 4 mg via INTRAVENOUS
  Filled 2019-10-05: qty 2

## 2019-10-05 MED ORDER — DIPHENHYDRAMINE HCL 50 MG/ML IJ SOLN
INTRAMUSCULAR | Status: AC
  Filled 2019-10-05: qty 1

## 2019-10-05 MED ORDER — FENTANYL CITRATE (PF) 100 MCG/2ML IJ SOLN
INTRAMUSCULAR | Status: AC
Start: 2019-10-05 — End: 2019-10-06
  Filled 2019-10-05: qty 2

## 2019-10-05 MED ORDER — SODIUM CHLORIDE 0.9 % IV SOLN
Freq: Once | INTRAVENOUS | Status: AC
  Filled 2019-10-05: qty 250

## 2019-10-05 MED ORDER — SODIUM CHLORIDE 0.9 % IV SOLN: INTRAVENOUS | Status: DC

## 2019-10-05 MED ORDER — HYDROMORPHONE HCL 1 MG/ML IJ SOLN
1.0000 mg | Freq: Once | INTRAMUSCULAR | Status: DC | PRN
  Filled 2019-10-05: qty 1

## 2019-10-05 MED ORDER — MIDAZOLAM HCL 2 MG/ML PO SYRP: 8.0000 mg | ORAL_SOLUTION | Freq: Once | ORAL | Status: DC | PRN

## 2019-10-05 MED ORDER — FAMOTIDINE 20 MG PO TABS: 40.0000 mg | ORAL_TABLET | Freq: Once | ORAL | Status: DC | PRN

## 2019-10-05 MED ORDER — CHLORHEXIDINE GLUCONATE CLOTH 2 % EX PADS
6.0000 | MEDICATED_PAD | Freq: Every day | CUTANEOUS | Status: DC
  Administered 2019-10-07 – 2019-10-10 (×3): 6 via TOPICAL

## 2019-10-05 MED ORDER — METHYLPREDNISOLONE SODIUM SUCC 125 MG IJ SOLR: 125.0000 mg | Freq: Once | INTRAMUSCULAR | Status: DC | PRN

## 2019-10-05 SURGICAL SUPPLY — 12 items
ADH SKN CLS APL DERMABOND .7 (GAUZE/BANDAGES/DRESSINGS) ×1
DERMABOND ADVANCED (GAUZE/BANDAGES/DRESSINGS) ×2
DERMABOND ADVANCED .7 DNX12 (GAUZE/BANDAGES/DRESSINGS) IMPLANT
DRAPE INCISE IOBAN 66X45 STRL (DRAPES) ×2 IMPLANT
KIT PORT POWER 8FR ISP CVUE (Port) ×2 IMPLANT
NDL ENTRY 21GA 7CM ECHOTIP (NEEDLE) IMPLANT
NEEDLE ENTRY 21GA 7CM ECHOTIP (NEEDLE) ×3 IMPLANT
PACK ANGIOGRAPHY (CUSTOM PROCEDURE TRAY) ×3 IMPLANT
SET INTRO CAPELLA COAXIAL (SET/KITS/TRAYS/PACK) ×2 IMPLANT
SUT MNCRL AB 4-0 PS2 18 (SUTURE) ×2 IMPLANT
SUT VIC AB 3-0 SH 27 (SUTURE) ×3
SUT VIC AB 3-0 SH 27X BRD (SUTURE) IMPLANT

## 2019-10-05 NOTE — Progress Notes (Signed)
PT Cancellation Note  Patient Details Name: Faith [name] MRN: [id number] DOB: 1945-09-17   Cancelled Treatment:    Reason Eval/Treat Not Completed: Fatigue/lethargy limiting ability to participate Pt out of room much of the day, getting port placed.  To start chemo.  This afternoon she was extremely lethargic and in speaking to RN and MD not appropriate for PT this date.  Will maintain on PT caseload and follow as appropriate.  Kreg Shropshire, DPT 10/05/2019, 5:13 PM

## 2019-10-05 NOTE — Progress Notes (Signed)
Arrived to patient's room (inpatient, room 258) around 1600. Patient alert and oriented and resting comfortably in the bed. Patient's friend Primus Bravo and Altha Harm, NP were present at the bedside. Introduced myself to patient and requested permission to check patient's port. Patient's port a cath was left accessed by Dr. Delana Meyer after placement today. VSS. Port was flushed with NS and blood return noted. NS started at [facility]. Premeds given as ordered. Patient became very sleepy from IV benadryl. Taxol was given as ordered. Patient tolerated well and was resting comfortably in the bed at completion. VSS. Port was saline locked and left accessed. Patient and friend were given verbal and written information concerning Taxol and the possible side effects. Report given to inpatient RN.

## 2019-10-05 NOTE — Progress Notes (Signed)
PROGRESS NOTE    Faith [name] [facility]   KZS:[id number]  DOB: [birth year]  PCP: Leonel Ramsay, MD    DOA: 09/30/2019 LOS: 5   Brief Narrative   [name] a13 y.o.femalewith a known history of coronary artery disease, type diabetes mellitus, hypertension, dyslipidemia, [name] She stated that she felt fine when she went home yesterday till this afternoon when she felt weak and dizzy and could not stand or walk. She admitted to associated abdominal pain epigastric and right upper quadrant area. She denied any nausea or vomiting or diarrhea. No fever or chills. No chest pain or dyspnea or cough or wheezing. No dysuria, oliguria, urgency or frequency or flank pain.  Upon presentation to the emergency room, hypertension was 89.68F/31.7C,blood pressure 72/44 with a heart rate of 96 respiratory to 24 and pulse 70 was 98% on room air.With clear bolus of IV normal saline her blood pressure was up to 130/48. Labs were remarkable for anion gap of 16, magnesium of 1.9 with a potassium of 3.8 BUN of 17 with a creatinine of 1.54 compared to 21 and 0.97 on 9/11, high-sensitivity troponin I 160 and later 111, AST 358 ALT of 218 and alk phos of 900 with total bili of 2.2 albumin of 3 and total protein of 6.5.The patient's alk phos was 669 on 9/10 with total bili of 2.3, AST is 225 and ALT 177. Lactic acid came back 5.9 and later down to 4.4 from 1.8 on 9/10. CBC showed leukocytosis of 13.2 with neutrophilia as well as thrombocytopenia of 135. WBCs were 10 on 9/11. At 500 mL D-dimer was more than 7500, Tylenol level 10 and salicylate less than 7. TSH was 4.2 and free T4 1.37 both slightly elevated. Blood cultures were  drawn. EKG showed sinus rhythm with rate of 98 with occasional PVCs and possible left atrial enlargement. Right upper quadrant ultrasound revealed the following: 1. Cholelithiasis without definite sonographic evidence for acute cholecystitis. 2. Mildly thickened gallbladder wall which is nonspecific but can be seen in patients with underlying hepatocellular disease. 3. Innumerable hepatic masses are again noted, consistent with metastatic disease.  The patient was given IV cefepime and Flagyl, IV Zofran and 3 L bolus of IV normal saline. She will be admitted to a progressive unit bed for further evaluation and management.   Assessment & Plan   Active Problems:   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 female, estrogen receptor positive ([place])   Elevated LFTs   Bone metastasis (HCC)   Liver metastasis (HCC)   HLD (hyperlipidemia)   Acute renal failure superimposed on stage 3a chronic kidney disease (HCC)   Sepsis (HCC)   Leukocytosis   Palliative care encounter   1. SIRS.  No clear source of infection at this time.  She did present with hypotension, elevated lactate, hypothermia and tachypnea.  No evidence of pneumonia on chest imaging.  Treated with IV abx 9/13-9/16. Cultures negative.   Overall hemodynamics have improved with IV fluids. Appreciate ID recs, noticed was not continued hom home prednisone, have restarted that. AM cortisol wnl and hemodynamics have improved, will hold on suggested acth stim test for now.  Initial plans were for paracentesis to rule out SBP, but she does not have significant ascites to  warrant paracentesis. MRCP yesterday w/o signs infection. Now providing supportive care. 2. Elevated liver enzymes.  Initially, based on imaging there was concern for cholecystitis.  Seen by general surgery high-dose felt unlikely that patient had cholecystitis and they have signed off.  She is also being seen by GI and it is thought that her elevated liver enzymes  likely related to underlying cancer. MRCP w/o signs obstruction. 3. Acute kidney injury on chronic kidney disease stage III.  Likely related to hypotension.  resolved 4. Left breast cancer with metastases to liver and bone.  Oncology consulted. Port placed today, chemo to start this evening.   5. CAD.  Holding statin in light of elevated liver enzymes. Holding home metoprolol 6. Leukocytosis.  Possibly reactive to #1.  She also received a dose of intravenous steroids while she was hypotensive.  She is currently afebrile.  Continue to monitor. 7. Hx GCA - resumed home prednisone 8. History of hypertension.  Antihypertensives currently on hold in light of recent hypotension.       Obesity: Body mass index is 34.25 kg/m.  Complicates overall care and prognosis.  DVT prophylaxis: enoxaparin (LOVENOX) injection 40 mg Start: 10/01/19 0000   Diet:  Diet Orders (From admission, onward)    Start     Ordered   10/05/19 1537  Diet regular Room service appropriate? Yes; Fluid consistency: Thin  Diet effective now       Question Answer Comment  Room service appropriate? Yes   Fluid consistency: Thin      10/05/19 1536            Code Status: DNR    Subjective 10/05/19    Patient seen at bedside this PM.  Sleepy after benadryl for port-a-cath placement which was successful. No n/v.   Disposition Plan & Communication   Status is: Inpatient  Remains inpatient appropriate because:Ongoing diagnostic testing needed not appropriate for outpatient work up   Dispo: The patient is from: Home              Anticipated d/c is to: Home              Anticipated d/c date is: 1-2 days              Patient currently is not medically stable to d/c.        Family Communication: friend updated at bedside   Consults, Procedures, Significant Events   Consultants:   Oncology  ID  GI (signed off)  Surgery (signed off)  Procedures:  right IJ Infuse-a-Port placed  9/17  Antimicrobials:  Anti-infectives (From admission, onward)   Start     Dose/Rate Route Frequency Ordered Stop   10/05/19 1300  ceFAZolin (ANCEF) IVPB 2g/100 mL premix        2 g 200 mL/hr over 30 Minutes Intravenous  Once 10/05/19 1253 10/05/19 1445   10/02/19 0200  vancomycin (VANCOCIN) IVPB 1000 mg/200 mL premix  Status:  Discontinued        1,000 mg 200 mL/hr over 60 Minutes Intravenous Every 24 hours 10/01/19 0316 10/01/19 1208   10/01/19 1000  ceFEPIme (MAXIPIME) 2 g in sodium chloride 0.9 % 100 mL IVPB  Status:  Discontinued        2 g 200 mL/hr over 30 Minutes Intravenous Every 12 hours 09/30/19 2356 10/04/19 1709   10/01/19 0600  metroNIDAZOLE (FLAGYL) IVPB 500 mg  Status:  Discontinued        500 mg 100 mL/hr over 60 Minutes  Intravenous Every 8 hours 09/30/19 2356 10/04/19 1709   10/01/19 0000  vancomycin (VANCOREADY) IVPB 1750 mg/350 mL        1,750 mg 175 mL/hr over 120 Minutes Intravenous  Once 09/30/19 2356 10/01/19 0426   09/30/19 2045  ceFEPIme (MAXIPIME) 2 g in sodium chloride 0.9 % 100 mL IVPB        2 g 200 mL/hr over 30 Minutes Intravenous  Once 09/30/19 2041 09/30/19 2157   09/30/19 2045  metroNIDAZOLE (FLAGYL) IVPB 500 mg        500 mg 100 mL/hr over 60 Minutes Intravenous  Once 09/30/19 2041 09/30/19 2257         Objective   Vitals:   10/05/19 1500 10/05/19 1515 10/05/19 1530 10/05/19 1630  BP: (!) 120/52 (!) 113/48 (!) 130/54 (!) 135/53  Pulse: 87 88 87 99  Resp: _0 Temp:    97.8 F (36.6 C)  TempSrc:    Oral  SpO2: 94% 94% 95% 95%  Weight:      Height:        Intake/Output Summary (Last 24 hours) at 10/05/2019 1711 Last data filed at 10/05/2019 4193 Gross per 24 hour  Intake 578.51 ml  Output 700 ml  Net -121.49 ml   Filed Weights   10/03/19 0544 10/04/19 0546 10/05/19 1339  Weight: 90.9 kg 99.2 kg 99.2 kg    Physical Exam:  General exam: sleepy but arousable, no acute distress Respiratory system: CTAB, normal  respiratory effort. Cardiovascular system: normal S1/S2, RRR, chronic lymphedema.   Gastrointestinal system: soft, protuberant but not distended, +bowel sounds. Central nervous system: A&O x4. no gross focal neurologic deficits, normal speech Extremities: moves all, lyphedema, normal tone Psychiatry:not assessed  Labs   Data Reviewed: I have personally reviewed following labs and imaging studies  CBC: Recent Labs  Lab 09/29/19 0451 09/30/19 1947 10/01/19 0602 10/02/19 0452 10/05/19 0705  WBC 10.0 13.2* 20.8* 10.0 15.7*  16.5*  NEUTROABS  --  12.5*  --   --  11.6*  HGB 11.4* 14.0 12.1 11.0* 13.6  13.1  HCT 34.2* 42.0 35.2* 31.7* 40.7  38.4  MCV 87.7 87.1 85.6 86.4 87.2  84.4  PLT 119* 135* 111* 102* 110*  790*   Basic Metabolic Panel: Recent Labs  Lab 09/30/19 1947 09/30/19 1947 10/01/19 0602 10/02/19 0452 10/03/19 0406 10/04/19 0605 10/05/19 0705  NA 141   < > 141 143 146* 142 142  K 3.8   < > 4.6 3.2* 3.9 3.6 4.1  CL 104   < > 108 114* 114* 113* 114*  CO2 21*   < > 21* 20* 22 18* 16*  GLUCOSE 113*   < > 115* 82 119* 103* 88  BUN 17   < > 17 24* 25* 24* 23  CREATININE 1.54*   < > 1.56* 1.31* 1.57* 1.39* 1.26*  CALCIUM 10.2   < > 8.7* 8.3* 8.7* 8.8* 9.0  MG 1.9  --   --   --   --   --   --   PHOS 2.3*  --   --   --   --   --   --    < > = values in this interval not displayed.   GFR: Estimated Creatinine Clearance: 46.7 mL/min (A) (by C-G formula based on SCr of 1.26 mg/dL (H)). Liver Function Tests: Recent Labs  Lab 10/01/19 0602 10/02/19 0452 10/03/19 0406 10/04/19 0605 10/05/19 0705  AST 415* 378* 302*  287* 311*  ALT 220* 201* 179* 172* 171*  ALKPHOS 615* 522* 579* 681* 754*  BILITOT 2.1* 1.8* 2.1* 3.1* 3.3*  PROT 5.5* 5.0* 5.0* 5.2* 5.2*  ALBUMIN 2.4* 2.1* 2.1* 2.2* 2.1*   Recent Labs  Lab 09/30/19 1947  LIPASE 73*   No results for input(s): AMMONIA in the last 168 hours. Coagulation Profile: Recent Labs  Lab 10/01/19 0602  INR 1.1    Cardiac Enzymes: No results for input(s): CKTOTAL, CKMB, CKMBINDEX, TROPONINI in the last 168 hours. BNP (last 3 results) No results for input(s): PROBNP in the last 8760 hours. HbA1C: No results for input(s): HGBA1C in the last 72 hours. CBG: Recent Labs  Lab 09/28/19 2203 09/29/19 0855 10/01/19 0723 10/01/19 1135 10/01/19 1557  GLUCAP 171* 100* 118* 130* 190*   Lipid Profile: No results for input(s): CHOL, HDL, LDLCALC, TRIG, CHOLHDL, LDLDIRECT in the last 72 hours. Thyroid Function Tests: No results for input(s): TSH, T4TOTAL, FREET4, T3FREE, THYROIDAB in the last 72 hours. Anemia Panel: No results for input(s): VITAMINB12, FOLATE, FERRITIN, TIBC, IRON, RETICCTPCT in the last 72 hours. Sepsis Labs: Recent Labs  Lab 09/30/19 1947 09/30/19 2144 10/01/19 0038 10/01/19 0602 10/03/19 0406  PROCALCITON  --   --   --  7.07 2.36  LATICACIDVEN 5.9* 4.4* 3.1*  --   --     Recent Results (from the past 240 hour(s))  SARS CORONAVIRUS 2 (TAT 6-24 HRS) Nasopharyngeal Nasopharyngeal Swab     Status: None   Collection Time: 09/26/19 11:21 AM   Specimen: Nasopharyngeal Swab  Result Value Ref Range Status   SARS Coronavirus 2 NEGATIVE NEGATIVE Final    Comment: (NOTE) SARS-CoV-2 target nucleic acids are NOT DETECTED.  The SARS-CoV-2 RNA is generally detectable in upper and lower respiratory specimens during the acute phase of infection. Negative results do not preclude SARS-CoV-2 infection, do not rule out co-infections with other pathogens, and should not be used as the sole basis for treatment or other patient management decisions. Negative results must be combined with clinical observations, patient history, and epidemiological information. The expected result is Negative.  Fact Sheet for Patients: SugarRoll.be  Fact Sheet for Healthcare Providers: https://www.woods-mathews.com/  This test is not yet approved or cleared by the  [place] FDA and  has been authorized for detection and/or diagnosis of SARS-CoV-2 by FDA under an Emergency Use Authorization (EUA). This EUA will remain  in effect (meaning this test can be used) for the duration of the COVID-19 declaration under Se ction 564(b)(1) of the Act, 21 U.S.C. section 360bbb-3(b)(1), unless the authorization is terminated or revoked sooner.  Performed at [facility], [place] [address] [place], [place] [address]   SARS Coronavirus 2 by RT PCR (hospital order, performed in [facility] hospital lab) Nasopharyngeal Nasopharyngeal Swab     Status: None   Collection Time: 09/28/19 10:51 AM   Specimen: Nasopharyngeal Swab  Result Value Ref Range Status   SARS Coronavirus 2 NEGATIVE NEGATIVE Final    Comment: (NOTE) SARS-CoV-2 target nucleic acids are NOT DETECTED.  The SARS-CoV-2 RNA is generally detectable in upper and lower respiratory specimens during the acute phase of infection. The lowest concentration of SARS-CoV-2 viral copies this assay can detect is 250 copies / mL. A negative result does not preclude SARS-CoV-2 infection and should not be used as the sole basis for treatment or other patient management decisions.  A negative result may occur with improper specimen collection / handling, submission of specimen other than  nasopharyngeal swab, presence of viral mutation(s) within the areas targeted by this assay, and inadequate number of viral copies (<250 copies / mL). A negative result must be combined with clinical observations, patient history, and epidemiological information.  Fact Sheet for Patients:   StrictlyIdeas.no  Fact Sheet for Healthcare Providers: BankingDealers.co.za  This test is not yet approved or  cleared by the [place] FDA and has been authorized for detection and/or diagnosis of SARS-CoV-2 by FDA under an Emergency Use Authorization (EUA).  This EUA will remain in  effect (meaning this test can be used) for the duration of the COVID-19 declaration under Section 564(b)(1) of the Act, 21 U.S.C. section 360bbb-3(b)(1), unless the authorization is terminated or revoked sooner.  Performed at [facility], [address] [place], [place] [address]   Blood culture (single)     Status: None   Collection Time: 09/30/19  7:47 PM   Specimen: BLOOD  Result Value Ref Range Status   Specimen Description BLOOD RIGHT [facility]  Final   Special Requests   Final    BOTTLES DRAWN AEROBIC AND ANAEROBIC Blood Culture results may not be optimal due to an excessive volume of blood received in culture bottles   Culture   Final    NO GROWTH 5 DAYS Performed at [facility], [address] [place], [place] [address]    Report Status 10/05/2019 FINAL  Final  Culture, blood (single)     Status: None   Collection Time: 09/30/19  9:44 PM   Specimen: BLOOD  Result Value Ref Range Status   Specimen Description BLOOD RIGHT [facility]  Final   Special Requests   Final    BOTTLES DRAWN AEROBIC AND ANAEROBIC Blood Culture adequate volume   Culture   Final    NO GROWTH 5 DAYS Performed at [facility], [address] [place], [place] [address]    Report Status 10/05/2019 FINAL  Final  SARS Coronavirus 2 by RT PCR (hospital order, performed in [facility] hospital lab) Nasopharyngeal Nasopharyngeal Swab     Status: None   Collection Time: 10/01/19 12:38 AM   Specimen: Nasopharyngeal Swab  Result Value Ref Range Status   SARS Coronavirus 2 NEGATIVE NEGATIVE Final    Comment: (NOTE) SARS-CoV-2 target nucleic acids are NOT DETECTED.  The SARS-CoV-2 RNA is generally detectable in upper and lower respiratory specimens during the acute phase of infection. The lowest concentration of SARS-CoV-2 viral copies this assay can detect is 250 copies / mL. A negative result does not preclude SARS-CoV-2 infection and should not be used as the sole basis for treatment  or other patient management decisions.  A negative result may occur with improper specimen collection / handling, submission of specimen other than nasopharyngeal swab, presence of viral mutation(s) within the areas targeted by this assay, and inadequate number of viral copies (<250 copies / mL). A negative result must be combined with clinical observations, patient history, and epidemiological information.  Fact Sheet for Patients:   StrictlyIdeas.no  Fact Sheet for Healthcare Providers: BankingDealers.co.za  This test is not yet approved or  cleared by the [place] FDA and has been authorized for detection and/or diagnosis of SARS-CoV-2 by FDA under an Emergency Use Authorization (EUA).  This EUA will remain in effect (meaning this test can be used) for the duration of the COVID-19 declaration under Section 564(b)(1) of the Act, 21 U.S.C. section 360bbb-3(b)(1), unless the authorization is terminated or revoked sooner.  Performed at [facility],  [place], [place] [address]       Imaging Studies   PERIPHERAL VASCULAR CATHETERIZATION  Result Date: 10/05/2019 See op note  MR 3D Recon At Scanner  Result Date: 10/04/2019 CLINICAL DATA:  Abdominal pain. Metastatic hepatic disease. EXAM: MRI ABDOMEN WITHOUT AND WITH CONTRAST (INCLUDING MRCP) TECHNIQUE: Multiplanar multisequence MR imaging of the abdomen was performed both before and after the administration of intravenous contrast. Heavily T2-weighted images of the biliary and pancreatic ducts were obtained, and three-dimensional MRCP images were rendered by post processing. CONTRAST:  54m GADAVIST GADOBUTROL 1 MMOL/ML IV SOLN COMPARISON:  CT scan 09/30/2019 and 09/28/2019 FINDINGS: Lower chest: The lung bases are grossly clear. Small bilateral pleural effusions and overlying atelectasis. Hepatobiliary: Diffuse/extensive metastatic disease throughout both  lobes of the liver. The gallbladder contains sludge in gallstones and is slightly distended but no obvious wall thickening or pericholecystic fluid. Normal caliber and course of the common bile duct. Pancreas:  No mass, inflammation or ductal dilatation. Spleen:  Normal size. No focal lesions. Adrenals/Urinary Tract: No obvious adrenal gland metastasis. Kidneys are unremarkable. Stable cysts. Stomach/Bowel: Visualized portions within the abdomen are unremarkable. Vascular/Lymphatic: No pathologically enlarged lymph nodes identified. No abdominal aortic aneurysm demonstrated. Other:  Small volume abdominal ascites. Musculoskeletal: Diffuse sclerotic metastatic bone disease. IMPRESSION: 1. Diffuse/extensive metastatic disease throughout both lobes of the liver. 2. Gallbladder sludge and gallstones but no definite findings for acute cholecystitis. 3. Normal caliber and course of the common bile duct. 4. No abdominal lymphadenopathy. 5. Diffuse sclerotic metastatic bone disease. 6. Small bilateral pleural effusions and overlying atelectasis. Electronically Signed   By: PMarijo SanesM.D.   On: 10/04/2019 05:31   MR ABDOMEN MRCP W WO CONTAST  Result Date: 10/04/2019 CLINICAL DATA:  Abdominal pain. Metastatic hepatic disease. EXAM: MRI ABDOMEN WITHOUT AND WITH CONTRAST (INCLUDING MRCP) TECHNIQUE: Multiplanar multisequence MR imaging of the abdomen was performed both before and after the administration of intravenous contrast. Heavily T2-weighted images of the biliary and pancreatic ducts were obtained, and three-dimensional MRCP images were rendered by post processing. CONTRAST:  925mGADAVIST GADOBUTROL 1 MMOL/ML IV SOLN COMPARISON:  CT scan 09/30/2019 and 09/28/2019 FINDINGS: Lower chest: The lung bases are grossly clear. Small bilateral pleural effusions and overlying atelectasis. Hepatobiliary: Diffuse/extensive metastatic disease throughout both lobes of the liver. The gallbladder contains sludge in gallstones and  is slightly distended but no obvious wall thickening or pericholecystic fluid. Normal caliber and course of the common bile duct. Pancreas:  No mass, inflammation or ductal dilatation. Spleen:  Normal size. No focal lesions. Adrenals/Urinary Tract: No obvious adrenal gland metastasis. Kidneys are unremarkable. Stable cysts. Stomach/Bowel: Visualized portions within the abdomen are unremarkable. Vascular/Lymphatic: No pathologically enlarged lymph nodes identified. No abdominal aortic aneurysm demonstrated. Other:  Small volume abdominal ascites. Musculoskeletal: Diffuse sclerotic metastatic bone disease. IMPRESSION: 1. Diffuse/extensive metastatic disease throughout both lobes of the liver. 2. Gallbladder sludge and gallstones but no definite findings for acute cholecystitis. 3. Normal caliber and course of the common bile duct. 4. No abdominal lymphadenopathy. 5. Diffuse sclerotic metastatic bone disease. 6. Small bilateral pleural effusions and overlying atelectasis. Electronically Signed   By: P.Marijo Sanes.D.   On: 10/04/2019 05:31     Medications   Scheduled Meds: . vitamin C  1,000 mg Oral Daily  . cholecalciferol  5,000 Units Oral BID  . enoxaparin (LOVENOX) injection  40 mg Subcutaneous Q24H  . fentaNYL      . midazolam      .  midazolam      . predniSONE  15 mg Oral Daily   Continuous Infusions: . sodium chloride 75 mL/hr at 10/05/19 1010       LOS: 5 days    Time spent: 30 minutes    Desma Maxim, MD [facility]  10/05/2019, 5:11 PM    If 7PM-7AM, please contact night-coverage. How to contact the [facility] Attending or Consulting provider [place] or covering provider during after hours [place], for this patient?    1. Check the care team in [facility] and look for a) attending/consulting [facility] provider listed and b) the [facility] team listed 2. Log into [email or web address] and use [place]'s universal password to access. If you do not have the password, please contact the hospital  operator. 3. Locate the [facility] provider you are looking for under [facility] and page to a number that you can be directly reached. 4. If you still have difficulty reaching the provider, please page the [facility] (Director on Call) for the Hospitalists listed on amion for assistance.

## 2019-10-05 NOTE — Progress Notes (Signed)
[facility] Hematology/Oncology Progress Note  Date of admission: 09/30/2019  Hospital day:  10/04/2019  Chief Complaint: Faith [name] is a 74 y.o. female with metastatic breast cancer who was admitted through the emergency room with hypotension, tachypnea, and hypothermia concerning for sepsis.  Subjective: Feeling better.  Poor appetite.  RUQ pain controlled.  Social History: The patient is alone today.  Allergies:  Allergies  Allergen Reactions  . Mushroom Extract Complex Hives and Swelling  . Nickel Rash  . Shellfish Allergy Nausea And Vomiting    Scheduled Medications: . vitamin C  1,000 mg Oral Daily  . cholecalciferol  5,000 Units Oral BID  . enoxaparin (LOVENOX) injection  40 mg Subcutaneous Q24H  . predniSONE  15 mg Oral Daily    Review of Systems: GENERAL:  Fatigue.  Denies fevers, chills or sweats. PERFORMANCE STATUS (ECOG):  2 HEENT:  No visual changes, runny nose, sore throat, mouth sores or tenderness. Lungs: No shortness of breath or cough.  No hemoptysis. Cardiac:  No chest pain, palpitations, orthopnea, or PND. GI:  RUQ pain controlled with oxycodone (notes makes sleepy).  Poor appetite.  No nausea, vomiting, diarrhea, constipation, melena or hematochezia. GU:  No urgency, frequency, dysuria, or hematuria. Musculoskeletal:  No back pain.  No joint pain.  No muscle tenderness. Extremities:  No pain or swelling. Skin:  No rashes or skin changes. Neuro:  No headache, numbness or weakness, balance or coordination issues. Endocrine: Diabetes.  No thyroid issues, hot flashes or night sweats. Psych:  Feels "ok".  No mood changes, depression or anxiety. Pain:  RUQ pain.  Denies bone pain. Review of systems:  All other systems reviewed and found to be negative.  Physical Exam: Blood pressure (!) 140/52, pulse 80, temperature (!) 97.5 F (36.4 C), temperature source Oral, resp. rate 18, height '5\' 7"'  (1.702 m), weight 218 lb 9.6 oz (99.2 kg),  SpO2 97 %.  GENERAL:  Fatigued appearing woman lying comfortably on the medical unit in no acute distress. MENTAL STATUS:  Alert and oriented to person, place and time. HEAD:  Shoulder length hair.  Normocephalic, atraumatic, face symmetric, no Cushingoid features. EYES:  Pupils equal round and reactive to light and accomodation.  No conjunctivitis or scleral icterus. ENT:  Oropharynx clear without lesion.  Tongue normal. Mucous membranes dry.  RESPIRATORY:  Clear to auscultation anteriorly without rales, wheezes or rhonchi. CARDIOVASCULAR:  Regular rate and rhythm without murmur, rub or gallop. ABDOMEN:  Soft, slight tenderness in RUQ without guarding or rebound.  Liver palpable.  Active bowel sounds and no splenomegaly.  SKIN:  No rashes, ulcers or lesions. EXTREMITIES: Chronic lower extremity changes.  No tenderness.  No palpable cords. NEUROLOGICAL: Unremarkable. PSYCH:  Appropriate.   Results for orders placed or performed during the hospital encounter of 09/30/19 (from the past 48 hour(s))  Comprehensive metabolic panel     Status: Abnormal   Collection Time: 10/04/19  6:05 AM  Result Value Ref Range   Sodium 142 135 - 145 mmol/L   Potassium 3.6 3.5 - 5.1 mmol/L   Chloride 113 (H) 98 - 111 mmol/L   CO2 18 (L) 22 - 32 mmol/L   Glucose, Bld 103 (H) 70 - 99 mg/dL    Comment: Glucose reference range applies only to samples taken after fasting for at least 8 hours.   BUN 24 (H) 8 - 23 mg/dL   Creatinine, Ser 1.39 (H) 0.44 - 1.00 mg/dL   Calcium 8.8 (L) 8.9 - 10.3  mg/dL   Total Protein 5.2 (L) 6.5 - 8.1 g/dL   Albumin 2.2 (L) 3.5 - 5.0 g/dL   AST 287 (H) 15 - 41 U/L   ALT 172 (H) 0 - 44 U/L   Alkaline Phosphatase 681 (H) 38 - 126 U/L   Total Bilirubin 3.1 (H) 0.3 - 1.2 mg/dL   GFR calc non Af Amer 37 (L) >60 mL/min   GFR calc Af Amer 43 (L) >60 mL/min   Anion gap 11 5 - 15    Comment: Performed at [facility], [address], [place], [place] [address]   MR 3D Recon  At Scanner  Result Date: 10/04/2019 CLINICAL DATA:  Abdominal pain. Metastatic hepatic disease. EXAM: MRI ABDOMEN WITHOUT AND WITH CONTRAST (INCLUDING MRCP) TECHNIQUE: Multiplanar multisequence MR imaging of the abdomen was performed both before and after the administration of intravenous contrast. Heavily T2-weighted images of the biliary and pancreatic ducts were obtained, and three-dimensional MRCP images were rendered by post processing. CONTRAST:  35m GADAVIST GADOBUTROL 1 MMOL/ML IV SOLN COMPARISON:  CT scan 09/30/2019 and 09/28/2019 FINDINGS: Lower chest: The lung bases are grossly clear. Small bilateral pleural effusions and overlying atelectasis. Hepatobiliary: Diffuse/extensive metastatic disease throughout both lobes of the liver. The gallbladder contains sludge in gallstones and is slightly distended but no obvious wall thickening or pericholecystic fluid. Normal caliber and course of the common bile duct. Pancreas:  No mass, inflammation or ductal dilatation. Spleen:  Normal size. No focal lesions. Adrenals/Urinary Tract: No obvious adrenal gland metastasis. Kidneys are unremarkable. Stable cysts. Stomach/Bowel: Visualized portions within the abdomen are unremarkable. Vascular/Lymphatic: No pathologically enlarged lymph nodes identified. No abdominal aortic aneurysm demonstrated. Other:  Small volume abdominal ascites. Musculoskeletal: Diffuse sclerotic metastatic bone disease. IMPRESSION: 1. Diffuse/extensive metastatic disease throughout both lobes of the liver. 2. Gallbladder sludge and gallstones but no definite findings for acute cholecystitis. 3. Normal caliber and course of the common bile duct. 4. No abdominal lymphadenopathy. 5. Diffuse sclerotic metastatic bone disease. 6. Small bilateral pleural effusions and overlying atelectasis. Electronically Signed   By: PMarijo SanesM.D.   On: 10/04/2019 05:31   MR ABDOMEN MRCP W WO CONTAST  Result Date: 10/04/2019 CLINICAL DATA:  Abdominal pain.  Metastatic hepatic disease. EXAM: MRI ABDOMEN WITHOUT AND WITH CONTRAST (INCLUDING MRCP) TECHNIQUE: Multiplanar multisequence MR imaging of the abdomen was performed both before and after the administration of intravenous contrast. Heavily T2-weighted images of the biliary and pancreatic ducts were obtained, and three-dimensional MRCP images were rendered by post processing. CONTRAST:  973mGADAVIST GADOBUTROL 1 MMOL/ML IV SOLN COMPARISON:  CT scan 09/30/2019 and 09/28/2019 FINDINGS: Lower chest: The lung bases are grossly clear. Small bilateral pleural effusions and overlying atelectasis. Hepatobiliary: Diffuse/extensive metastatic disease throughout both lobes of the liver. The gallbladder contains sludge in gallstones and is slightly distended but no obvious wall thickening or pericholecystic fluid. Normal caliber and course of the common bile duct. Pancreas:  No mass, inflammation or ductal dilatation. Spleen:  Normal size. No focal lesions. Adrenals/Urinary Tract: No obvious adrenal gland metastasis. Kidneys are unremarkable. Stable cysts. Stomach/Bowel: Visualized portions within the abdomen are unremarkable. Vascular/Lymphatic: No pathologically enlarged lymph nodes identified. No abdominal aortic aneurysm demonstrated. Other:  Small volume abdominal ascites. Musculoskeletal: Diffuse sclerotic metastatic bone disease. IMPRESSION: 1. Diffuse/extensive metastatic disease throughout both lobes of the liver. 2. Gallbladder sludge and gallstones but no definite findings for acute cholecystitis. 3. Normal caliber and course of the common bile duct. 4. No abdominal lymphadenopathy. 5. Diffuse  sclerotic metastatic bone disease. 6. Small bilateral pleural effusions and overlying atelectasis. Electronically Signed   By: Marijo Sanes M.D.   On: 10/04/2019 05:31    Assessment:  Faith [name] is a 74 y.o. female with metastatic breast cancer who was admitted through the emergency room with weakness, lightheadedness and  RUQ abdominal pain. Ultrasound guided liver biopsy on 09/18/2019 confirmed metastatic breast cancer (ER+, PR-, and Her2/neu) -.  Chest, abdomen, and pelvis CT angiogram on 09/30/2019 revealed increasingly conspicuous gallbladder wall thickening with some pericholecystic inflammation, nonspecific fluid-filled distal small bowel, innumerable liver lesions, and developing ascites (? secondary to liver dysfunction), small right adrenal nodule, bone metastasis, and nonspecific endometrial thickening..  Abdomen MRCP on 10/03/2019 revealed diffuse/extensive metastatic disease throughout both lobes of the liver.  There was gallbladder sludge and gallstones but no definite findings for acute cholecystitis. The common bile duct was normal caliber.  There was no abdominal lymphadenopathy, but diffuse sclerotic metastatic bone disease.  There were small bilateral pleural effusions and overlying atelectasis.  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  She has a history of hypercalcemia (11.7 corrected).  She was treated with Zometa on 09/26/2019.   Symptomatically, she is feeling better.  She has been afebrile and normotensive.  Pain is well controlled.  Plan:   1.  Metastatic breast cancer             Patient has significant metastatic disease to the liver confirmed by liver biopsy on 09/18/2019.             Discuss recent MRCP- no evidence of biliary obstruction but extensive liver disease.             Elevated alkaline phosphatase secondary to liver and bone metastasis.                         Discuss plan for outpatient bone scan to fully assess bone disease.             Review increasing liver function tests and limited window of opportunity to treat based on Taxol metabolism.   Potential side effects reviewed in detail.              Patient wishes to pursue therapy if possible.  Port-a-cath clearance given for placement on 10/05/2019. 2.  R/o sepsis             WBC increased  from 13,200 to 20,800 on 10/01/2019 then 10,000 on 10/02/2019.                         Patient received hydrocortisone 5 hours before labs on 10/01/2019.                        Patient has presented twice in the past week with hypotension.             Source of possible infection was unclear.                          Paracentesis unable to be performed secondary to small amount of fluid.   No biliary obstruction.  Appreciate ID consultation.  All cultures negative.  AM cortisol level on 10/03/2019 was normal.  Antibiotics discontinued. 3.   Disposition  Patient notes plan for SNF.  Anticipate follow-up in clinic on 10/08/2019 for initiation of weekly Taxol (  unless given while hospitalized).   Lequita Asal, MD  10/05/2019, 7:44 AM

## 2019-10-05 NOTE — Progress Notes (Signed)
ID Pt still very tired, no energy, appetite poor Say her rt upper quadrant pain is better controlled  Patient Vitals for the past 24 hrs:  BP Temp Temp src Pulse Resp SpO2  10/05/19 0721 (!) 140/52 (!) 97.5 F (36.4 C) Oral 80 18 97 %  10/05/19 0403 (!) 152/59 98 F (36.7 C) Oral 79 -- 94 %  10/05/19 0327 (!) 134/50 98.4 F (36.9 C) Oral 75 18 96 %  10/04/19 1938 (!) 142/63 97.8 F (36.6 C) Oral 80 18 94 %  10/04/19 1525 (!) 159/71 98.1 F (36.7 C) Oral 88 18 96 %  10/04/19 1132 (!) 138/54 97.7 F (36.5 C) Oral 82 18 96 %      CBC Latest Ref Rng & Units 10/05/2019 10/02/2019 10/01/2019  WBC 4.0 - 10.5 K/uL 16.5(H) 10.0 20.8(H)  Hemoglobin 12.0 - 15.0 g/dL 13.1 11.0(L) 12.1  Hematocrit 36 - 46 % 38.4 31.7(L) 35.2(L)  Platelets 150 - 400 K/uL 123(L) 102(L) 111(L)     CMP Latest Ref Rng & Units 10/05/2019 10/04/2019 10/03/2019  Glucose 70 - 99 mg/dL 88 103(H) 119(H)  BUN 8 - 23 mg/dL 23 24(H) 25(H)  Creatinine 0.44 - 1.00 mg/dL 1.26(H) 1.39(H) 1.57(H)  Sodium 135 - 145 mmol/L 142 142 146(H)  Potassium 3.5 - 5.1 mmol/L 4.1 3.6 3.9  Chloride 98 - 111 mmol/L 114(H) 113(H) 114(H)  CO2 22 - 32 mmol/L 16(L) 18(L) 22  Calcium 8.9 - 10.3 mg/dL 9.0 8.8(L) 8.7(L)  Total Protein 6.5 - 8.1 g/dL 5.2(L) 5.2(L) 5.0(L)  Total Bilirubin 0.3 - 1.2 mg/dL 3.3(H) 3.1(H) 2.1(H)  Alkaline Phos 38 - 126 U/L 754(H) 681(H) 579(H)  AST 15 - 41 U/L 311(H) 287(H) 302(H)  ALT 0 - 44 U/L 171(H) 172(H) 179(H)     Impression/recommendation Patient presenting with dizziness, weakness, near syncope and found to have hypotension, hypothermia,. Her other abnormalities are abnormal LFTs, liver metastasis, bone mets  SIRS-like presentation. Due to high tumor burden and compounded by not taking prednisone. Infection less likely by  neg BC, no  biliary dilatation or liver abscess or bilioma  on MRI/MRCP but  she is at risk because of excess tumor burden in the liver She is also at risk for tumor venous thrombosis    she was on cefepime and flagyl since 9/13 which were Dc on 10/04/19 night  Metastatic breast cancer with heavy liver and bone mets-   AKI  Leucocytosis- resolved, and then up today likely due to steroids as started on prednisone on 9/15  B/L TKA  _Recent DKA secondary to steroids with admission to [facility] in June/July. Also had Klebsiella bacteremia and Aerococcus bacteremia. Giant cell arteritis needing Tocilizumab and high-dose steroids since May 2021. Steroids have been tapered and she last was taking prednisone 15mg  . Was Not receiving any here- . Restarted prednisoneon 9/15  CAD status post angioplasty and stent.  Discussed with Dr.Corcoran Pt is going for PORT placement today to start chemo

## 2019-10-05 NOTE — Consult Note (Signed)
[place]  Telephone:([phone] [phone]   Name: Marji Kuehnel [facility] Date: 10/05/2019 MRN: [id number]  DOB: Sep 16, 1945  Patient Care Team: Leonel Ramsay, MD as PCP - General (Infectious Diseases) Hessie Knows, MD as Consulting Physician (Orthopedic Surgery) Lequita Asal, MD as Referring Physician (Hematology and Oncology) Lucy Antigua, MD as Referring Physician (Rheumatology)    REASON FOR CONSULTATION: Faith [name] is a 74 y.o. female with multiple medical problems including history of metastatic breast cancer status post bilateral mastectomies and chemotherapy, hypertension, hyperlipidemia, diabetes, CAD, and CKD stage III, who was recently hospitalized 09/28/2019 to 09/29/2019 with near syncope and hypotension.  Patient was readmitted 10/01/2019 with abdominal pain and weakness and was found to have possible SIRS although infectious source was unclear.  Patient was found to have progressively worse transaminitis with abdominal MRI showing significant tumor burden in the liver.  Decision was made to initiate chemotherapy.  Palliative care was consulted to help address goals and manage ongoing symptoms.  SOCIAL HISTORY:     reports that she quit smoking about 34 years ago. Her smoking use included cigarettes. She has a 15.00 pack-year smoking history. She has never used smokeless tobacco. She reports current alcohol use. She reports that she does not use drugs.   Patient is married and lives at home with her husband and two horses.  She is formally from the [facility] but moved to [place] in the 1980s [place].  She worked as a Programmer, systems.  ADVANCE DIRECTIVES:  Does not have  CODE STATUS: DNR  PAST MEDICAL HISTORY: Past Medical History:  Diagnosis Date  . Arthritis    bilateral knees  . Cancer [facility]) April 2015   left breast  . Coronary artery disease   . Diabetes  mellitus without complication ([place])   . Hyperlipidemia   . Hypertension   . Myocardial infarction ([place]) 11/03/2015   [facility], stent placed  . Pneumonia     PAST SURGICAL HISTORY:  Past Surgical History:  Procedure Laterality Date  . BREAST SURGERY Bilateral 11/19/13   bilater mastectomy   . CARDIAC CATHETERIZATION    . CORONARY ANGIOPLASTY    . PORT-A-CATH REMOVAL  2016  . PORTACATH PLACEMENT  06-07-13  . SHOULDER OPEN ROTATOR CUFF REPAIR  2012  . TOTAL KNEE ARTHROPLASTY Left 03/28/2018   Procedure: TOTAL KNEE ARTHROPLASTY-LEFT;  Surgeon: Hessie Knows, MD;  Location: [facility] ORS;  Service: Orthopedics;  Laterality: Left;  . TOTAL KNEE ARTHROPLASTY Right 08/01/2018   Procedure: RIGHT TOTAL KNEE ARTHROPLASTY;  Surgeon: Hessie Knows, MD;  Location: [facility] ORS;  Service: Orthopedics;  Laterality: Right;  . WRIST SURGERY Left 1969   cyst    HEMATOLOGY/ONCOLOGY HISTORY:  Oncology History Overview Note  1. 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  Biopsy from the lymph node in the left axilla positive for metastatic memory carcinoma Estrogen receptor positive Progesterone receptor +ve hER-2/neu receptors equivocal  by Fish  IHC for HER-2/neu is 2+ Clinically staged asT4 D. N1 M0 tumor stage IV  locally advanced carcinoma.. 2. She was started on   [place] OF 2015. 3. Treatment was changed to Cytoxan and Adriamycinffrom August 31, 2013  4.patient has finished oral 3 cycles of chemotherapy with Cytoxan and Adriamycin on October 19, 2013. 5.Patient had a bilateral mastectomy in  November of 2015. ypT4B ypN3 ypM1 STAGE iv DISEASE.. repeat HER-2/neu receptor by [facility] is still equivocal (November, 2015).  6.  Started on Nov 2015-  letrozole and IBRANCE ; PET may 2017- NED; NOV 1st 2017- NED   # # Acute MI- s/p stenting [[facility]]  # RML 4 mm nodule- 1st NOV 2017-  resolved ---------------------------------------------------------   DIAGNOSIS: BREAST CA  STAGE:  IV       ;GOALS: palliative  CURRENT/MOST RECENT THERAPY: Ibrance+ Femara    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 female, estrogen receptor positive ([place])  10/05/2019 -  Chemotherapy   The patient had PACLitaxel (TAXOL) 60 mg in sodium chloride 0.9 % 150 mL chemo infusion (</= 80m/m2), 30 mg/m2 = 60 mg (37.5 % of original dose 80 mg/m2), Intravenous,  Once, 1 of 4 cycles Dose modification: 30 mg/m2 (original dose 80 mg/m2, Cycle 1, Reason: Provider Judgment, Comment: dose redcution for LFTs)  for chemotherapy treatment.    Bone metastasis ([place]  09/09/2019 Initial Diagnosis   Bone metastasis ([place]   10/05/2019 -  Chemotherapy   The patient had PACLitaxel (TAXOL) 60 mg in sodium chloride 0.9 % 150 mL chemo infusion (</= 833mm2), 30 mg/m2 = 60 mg (37.5 % of original dose 80 mg/m2), Intravenous,  Once, 1 of 4 cycles Dose modification: 30 mg/m2 (original dose 80 mg/m2, Cycle 1, Reason: Provider Judgment, Comment: dose redcution for LFTs)  for chemotherapy treatment.    Liver metastasis (HCWis[place]k 09/25/2019 Initial Diagnosis   Liver metastasis (HCLydia  10/05/2019 -  Chemotherapy   The patient had PACLitaxel (TAXOL) 60 mg in sodium chloride 0.9 % 150 mL chemo infusion (</= 8049m2), 30 mg/m2 = 60 mg (37.5 % of original dose 80 mg/m2), Intravenous,  Once, 1 of 4 cycles Dose modification: 30 mg/m2 (original dose 80 mg/m2, Cycle 1, Reason: Provider Judgment, Comment: dose redcution for LFTs)  for chemotherapy treatment.      ALLERGIES:  is allergic to mushroom extract complex, nickel, and shellfish allergy.  MEDICATIONS:  Current Facility-Administered Medications  Medication Dose Route Frequency Provider Last Rate Last Admin  . 0.9 %  sodium chloride infusion   Intravenous Continuous Schnier, GreDolores LoryD 75 mL/hr at 10/05/19 1010 New Bag at 10/05/19 1010  . acetaminophen (TYLENOL)  tablet 650 mg  650 mg Oral Q6H PRN Schnier, GreDolores LoryD   650 mg at 10/04/19 0025   Or  . acetaminophen (TYLENOL) suppository 650 mg  650 mg Rectal Q6H PRN Schnier, GreDolores LoryD      . ascorbic acid (VITAMIN C) tablet 1,000 mg  1,000 mg Oral Daily Schnier, GreDolores LoryD   1,000 mg at 10/04/19 0850  . cholecalciferol (VITAMIN D3) tablet 5,000 Units  5,000 Units Oral BID SchDelana MeyereDolores LoryD   5,000 Units at 10/04/19 2234  . enoxaparin (LOVENOX) injection 40 mg  40 mg Subcutaneous Q24H Schnier, GreDolores LoryD   40 mg at 10/04/19 2234  . fentaNYL (SUBLIMAZE) 100 MCG/2ML injection           . HYDROmorphone (DILAUDID) injection 1 mg  1 mg Intravenous Once PRN Schnier, GreDolores LoryD      . magnesium hydroxide (MILK OF MAGNESIA) suspension 30 mL  30 mL Oral Daily PRN Schnier, GreDolores LoryD      . midazolam (VERSED) 2 MG/2ML injection           . midazolam (VERSED) 2 MG/2ML injection           .  ondansetron (ZOFRAN) tablet 4 mg  4 mg Oral Q6H PRN Schnier, Dolores Lory, MD       Or  . ondansetron [facility]) injection 4 mg  4 mg Intravenous Q6H PRN Schnier, Dolores Lory, MD   4 mg at 10/05/19 0902  . ondansetron (ZOFRAN) injection 4 mg  4 mg Intravenous Q6H PRN Schnier, Dolores Lory, MD      . oxyCODONE (Oxy IR/ROXICODONE) immediate release tablet 5 mg  5 mg Oral Q6H PRN Schnier, Dolores Lory, MD   5 mg at 10/05/19 0358  . predniSONE (DELTASONE) tablet 15 mg  15 mg Oral Daily Schnier, Dolores Lory, MD   15 mg at 10/04/19 0849  . traZODone (DESYREL) tablet 25 mg  25 mg Oral QHS PRN Schnier, Dolores Lory, MD   25 mg at 10/01/19 2351   Facility-Administered Medications Ordered in Other Encounters  Medication Dose Route Frequency Provider Last Rate Last Admin  . dexamethasone (DECADRON) 10 mg in sodium chloride 0.9 % 50 mL IVPB  10 mg Intravenous Once Lequita Asal, MD 204 mL/hr at 10/05/19 1619 10 mg at 10/05/19 1619  . famotidine (PEPCID) IVPB 20 mg premix  20 mg Intravenous Once Corcoran, Melissa C, MD      .  PACLitaxel (TAXOL) 60 mg in sodium chloride 0.9 % 150 mL chemo infusion (</= 58m/m2)  30 mg/m2 (Treatment Plan Recorded) Intravenous Once CMike Gip Melissa C, MD      . sodium chloride 0.9 % injection 10 mL  10 mL Intravenous PRN CForest Gleason MD   10 mL at 09/12/14 1014    VITAL SIGNS: BP (!) 135/53 (BP Location: Left Arm)   Pulse 99   Temp 97.8 F (36.6 C) (Oral)   Resp 20   Ht '5\' 7"'  (1.702 m)   Wt 218 lb 11.1 oz (99.2 kg)   SpO2 95%   BMI 34.25 kg/m  Filed Weights   10/03/19 0544 10/04/19 0546 10/05/19 1339  Weight: 200 lb 6.4 oz (90.9 kg) 218 lb 9.6 oz (99.2 kg) 218 lb 11.1 oz (99.2 kg)    Estimated body mass index is 34.25 kg/m as calculated from the following:   Height as of this encounter: '5\' 7"'  (1.702 m).   Weight as of this encounter: 218 lb 11.1 oz (99.2 kg).  LABS: CBC:    Component Value Date/Time   WBC 16.5 (H) 10/05/2019 0705   WBC 15.7 (H) 10/05/2019 0705   HGB 13.1 10/05/2019 0705   HGB 13.6 10/05/2019 0705   HGB 12.4 05/16/2014 1154   HCT 38.4 10/05/2019 0705   HCT 40.7 10/05/2019 0705   HCT 36.1 05/16/2014 1154   PLT 123 (L) 10/05/2019 0705   PLT 110 (L) 10/05/2019 0705   PLT 289 05/16/2014 1154   MCV 84.4 10/05/2019 0705   MCV 87.2 10/05/2019 0705   MCV 96 05/16/2014 1154   NEUTROABS 11.6 (H) 10/05/2019 0705   NEUTROABS 2.4 05/16/2014 1154   LYMPHSABS 1.4 10/05/2019 0705   LYMPHSABS 1.0 05/16/2014 1154   MONOABS 0.8 10/05/2019 0705   MONOABS 0.2 05/16/2014 1154   EOSABS 0.0 10/05/2019 0705   EOSABS 0.0 05/16/2014 1154   BASOSABS 0.1 10/05/2019 0705   BASOSABS 0.1 05/16/2014 1154   Comprehensive Metabolic Panel:    Component Value Date/Time   NA 142 10/05/2019 0705   NA 137 05/16/2014 1154   K 4.1 10/05/2019 0705   K 3.9 05/16/2014 1154   CL 114 (H) 10/05/2019 0705   CL 104 05/16/2014 1154  CO2 16 (L) 10/05/2019 0705   CO2 25 05/16/2014 1154   BUN 23 10/05/2019 0705   BUN 20 05/16/2014 1154   CREATININE 1.26 (H) 10/05/2019 0705    CREATININE 1.00 05/16/2014 1154   GLUCOSE 88 10/05/2019 0705   GLUCOSE 110 (H) 05/16/2014 1154   CALCIUM 9.0 10/05/2019 0705   CALCIUM 9.6 05/16/2014 1154   AST 311 (H) 10/05/2019 0705   AST 26 05/16/2014 1154   ALT 171 (H) 10/05/2019 0705   ALT 37 05/16/2014 1154   ALKPHOS 754 (H) 10/05/2019 0705   ALKPHOS 72 05/16/2014 1154   BILITOT 3.3 (H) 10/05/2019 0705   BILITOT 0.5 05/16/2014 1154   PROT 5.2 (L) 10/05/2019 0705   PROT 7.4 05/16/2014 1154   ALBUMIN 2.1 (L) 10/05/2019 0705   ALBUMIN 4.0 05/16/2014 1154    RADIOGRAPHIC STUDIES: CT Abdomen W Contrast  Result Date: 09/06/2019 CLINICAL DATA:  History of breast cancer. Abnormal liver function tests. Restaging. EXAM: CT ABDOMEN WITH CONTRAST TECHNIQUE: Multidetector CT imaging of the abdomen was performed using the standard protocol following bolus administration of intravenous contrast. CONTRAST:  160m OMNIPAQUE IOHEXOL 300 MG/ML  SOLN COMPARISON:  09/07/2018 CT chest, abdomen and pelvis. FINDINGS: Lower chest: No significant pulmonary nodules or acute consolidative airspace disease. Hepatobiliary: There are numerous (greater than 15) new confluent hypoenhancing liver masses scattered throughout the liver. Representative segment 4A left liver lobe 5.0 x 4.8 cm mass (series 2/image 14), 8.2 x 4.4 cm anterior inferior liver mass (series 2/image 36) involving segments 5 and 4B, and 6.5 x 3.3 cm segment 8 right liver lobe mass (series 2/image 19). Cholelithiasis. No intrahepatic biliary ductal dilatation. Top normal caliber common bile duct (6 mm diameter). Pancreas: Normal, with no mass or duct dilation. Spleen: Normal size. No mass. Adrenals/Urinary Tract: New hypodense 1.2 cm right adrenal nodule with density 54 HU (series 2/image 22). No discrete left adrenal nodule. No hydronephrosis. Simple 3.9 cm posterior lower right renal cyst. Subcentimeter hypodense renal cortical lesions in both kidneys are too small to characterize. Stomach/Bowel:  Small hiatal hernia. Otherwise normal nondistended stomach. Visualized small and large bowel is normal caliber, with no bowel wall thickening. Moderate colonic diverticulosis. Right upper quadrant cecum. Vascular/Lymphatic: Atherosclerotic nonaneurysmal abdominal aorta. Patent portal, splenic, hepatic and renal veins. No pathologically enlarged lymph nodes in the abdomen. Other: No pneumoperitoneum, ascites or focal fluid collection. Musculoskeletal: New patchy indistinct small sclerotic foci throughout the visualized axial skeleton with representative sclerotic 1.4 cm L2 vertebral lesion (series 5/image 71). IMPRESSION: 1. Numerous new confluent hypoenhancing liver masses scattered throughout the liver, compatible with liver metastases. 2. New patchy indistinct small sclerotic foci throughout the visualized axial skeleton, compatible with osseous metastases. 3. New small right adrenal nodule, suspicious for adrenal metastasis. 4. Small hiatal hernia. 5. Cholelithiasis. 6. Moderate colonic diverticulosis. 7. Aortic Atherosclerosis (ICD10-I70.0). These results will be called to the ordering clinician or representative by the Radiology Department at the imaging location. Electronically Signed   By: JIlona SorrelM.D.   On: 09/06/2019 11:01   CT CHEST ABDOMEN PELVIS W CONTRAST  Result Date: 09/28/2019 CLINICAL DATA:  Weakness and hypotension.  Metastatic breast cancer. EXAM: CT CHEST, ABDOMEN, AND PELVIS WITH CONTRAST TECHNIQUE: Multidetector CT imaging of the chest, abdomen and pelvis was performed following the standard protocol during bolus administration of intravenous contrast. CONTRAST:  773mOMNIPAQUE IOHEXOL 300 MG/ML  SOLN COMPARISON:  CT abdomen dated September 06, 2019. CT chest dated September 07, 2018. FINDINGS: CT CHEST FINDINGS Cardiovascular: No  significant vascular findings. Normal heart size. No pericardial effusion. No thoracic aortic aneurysm. Coronary, aortic arch, and branch vessel atherosclerotic  vascular disease. Mediastinum/Nodes: No enlarged mediastinal, hilar, or axillary lymph nodes. Prior left axillary lymph node dissection. Unchanged 7 mm hypodense nodule in the right thyroid lobe. Not clinically significant; no follow-up imaging recommended. Trachea and esophagus demonstrate no significant findings. Lungs/Pleura: No focal consolidation, pleural effusion, or pneumothorax. New 3 mm nodule in the peripheral left lower lobe (series 4, image 107). No additional pulmonary nodules. Musculoskeletal: Innumerable patchy indistinct small sclerotic foci throughout the thoracic spine, sternum, and ribs, new since August 2020. Prior bilateral mastectomies. CT ABDOMEN PELVIS FINDINGS Hepatobiliary: Innumerable confluent hypoenhancing liver masses scattered throughout the liver are similar to recent CT. New trace perihepatic ascites. No gallstones or gallbladder wall thickening. No biliary dilatation. Pancreas: Unremarkable. No pancreatic ductal dilatation or surrounding inflammatory changes. Spleen: Normal in size without focal abnormality. Adrenals/Urinary Tract: Unchanged 1.2 cm right adrenal nodule. The left adrenal gland is unremarkable. Unchanged 3.9 cm simple cyst in the right kidney. No renal calculi or hydronephrosis. Bladder is unremarkable. Stomach/Bowel: Stomach is within normal limits. Appendix appears normal. No evidence of bowel wall thickening, distention, or inflammatory changes. Moderate colonic diverticulosis. Vascular/Lymphatic: Aortic atherosclerosis. No enlarged abdominal or pelvic lymph nodes. Reproductive: Calcified uterine fibroids.  No adnexal mass. Other: Trace free fluid in the pelvis. No pneumoperitoneum. Small fat containing umbilical hernia. Musculoskeletal: Innumerable patchy indistinct small sclerotic foci scattered throughout the lumbar spine and pelvis are again noted. IMPRESSION: 1. No acute intrathoracic process. No acute intra-abdominal process. 2. Unchanged hepatic and osseous  metastases. Unchanged suspected small right adrenal metastasis. 3. New 3 mm pulmonary nodule in the peripheral left lower lobe, indeterminate. Attention on follow-up. 4. New trace ascites. 5. Aortic Atherosclerosis (ICD10-I70.0). Electronically Signed   By: Titus Dubin M.D.   On: 09/28/2019 12:51   PERIPHERAL VASCULAR CATHETERIZATION  Result Date: 10/05/2019 See op note  MR 3D Recon At Scanner  Result Date: 10/04/2019 CLINICAL DATA:  Abdominal pain. Metastatic hepatic disease. EXAM: MRI ABDOMEN WITHOUT AND WITH CONTRAST (INCLUDING MRCP) TECHNIQUE: Multiplanar multisequence MR imaging of the abdomen was performed both before and after the administration of intravenous contrast. Heavily T2-weighted images of the biliary and pancreatic ducts were obtained, and three-dimensional MRCP images were rendered by post processing. CONTRAST:  45m GADAVIST GADOBUTROL 1 MMOL/ML IV SOLN COMPARISON:  CT scan 09/30/2019 and 09/28/2019 FINDINGS: Lower chest: The lung bases are grossly clear. Small bilateral pleural effusions and overlying atelectasis. Hepatobiliary: Diffuse/extensive metastatic disease throughout both lobes of the liver. The gallbladder contains sludge in gallstones and is slightly distended but no obvious wall thickening or pericholecystic fluid. Normal caliber and course of the common bile duct. Pancreas:  No mass, inflammation or ductal dilatation. Spleen:  Normal size. No focal lesions. Adrenals/Urinary Tract: No obvious adrenal gland metastasis. Kidneys are unremarkable. Stable cysts. Stomach/Bowel: Visualized portions within the abdomen are unremarkable. Vascular/Lymphatic: No pathologically enlarged lymph nodes identified. No abdominal aortic aneurysm demonstrated. Other:  Small volume abdominal ascites. Musculoskeletal: Diffuse sclerotic metastatic bone disease. IMPRESSION: 1. Diffuse/extensive metastatic disease throughout both lobes of the liver. 2. Gallbladder sludge and gallstones but no  definite findings for acute cholecystitis. 3. Normal caliber and course of the common bile duct. 4. No abdominal lymphadenopathy. 5. Diffuse sclerotic metastatic bone disease. 6. Small bilateral pleural effusions and overlying atelectasis. Electronically Signed   By: PMarijo SanesM.D.   On: 10/04/2019 05:31   [place]BIOPSY (LIVER)  Result  Date: 09/18/2019 INDICATION: 74 year old with history of breast cancer and multiple new liver lesions. Tissue diagnosis is needed. EXAM: ULTRASOUND-GUIDED LIVER LESION BIOPSY MEDICATIONS: None. ANESTHESIA/SEDATION: Moderate (conscious) sedation was employed during this procedure. A total of Versed 2.0 mg and Fentanyl 100 mcg was administered intravenously. Moderate Sedation Time: 15 minutes. The patient's level of consciousness and vital signs were monitored continuously by radiology nursing throughout the procedure under my direct supervision. FLUOROSCOPY TIME:  None COMPLICATIONS: None immediate. PROCEDURE: Informed written consent was obtained from the patient after a thorough discussion of the procedural risks, benefits and alternatives. All questions were addressed. A timeout was performed prior to the initiation of the procedure. Liver was evaluated with ultrasound. Lesion in the left hepatic lobe was targeted for biopsy. The anterior abdomen was prepped with chlorhexidine and sterile field was created. Maximal barrier sterile technique was utilized including mask, sterile gowns, sterile gloves, sterile drape, hand hygiene and skin antiseptic. Skin was anesthetized using 1% lidocaine. Small incision was made. Using ultrasound guidance, a 17 gauge coaxial needle was directed into the left hepatic lobe and a hypoechoic lesion. Multiple core biopsies were obtained with an 18 gauge core device. Specimens placed in formalin. 17 gauge needle was removed without complication. Bandage placed over the puncture site. FINDINGS: Numerous hypoechoic lesions scattered throughout the  liver. Hypoechoic lesion in left hepatic lobe was targeted for biopsy. Biopsy needle confirmed within the lesion. Adequate core specimens obtained. No immediate bleeding or hematoma formation. IMPRESSION: Ultrasound-guided core biopsy of a left hepatic lesion. Electronically Signed   By: Markus Daft M.D.   On: 09/18/2019 13:19   MR ABDOMEN MRCP W WO CONTAST  Result Date: 10/04/2019 CLINICAL DATA:  Abdominal pain. Metastatic hepatic disease. EXAM: MRI ABDOMEN WITHOUT AND WITH CONTRAST (INCLUDING MRCP) TECHNIQUE: Multiplanar multisequence MR imaging of the abdomen was performed both before and after the administration of intravenous contrast. Heavily T2-weighted images of the biliary and pancreatic ducts were obtained, and three-dimensional MRCP images were rendered by post processing. CONTRAST:  53m GADAVIST GADOBUTROL 1 MMOL/ML IV SOLN COMPARISON:  CT scan 09/30/2019 and 09/28/2019 FINDINGS: Lower chest: The lung bases are grossly clear. Small bilateral pleural effusions and overlying atelectasis. Hepatobiliary: Diffuse/extensive metastatic disease throughout both lobes of the liver. The gallbladder contains sludge in gallstones and is slightly distended but no obvious wall thickening or pericholecystic fluid. Normal caliber and course of the common bile duct. Pancreas:  No mass, inflammation or ductal dilatation. Spleen:  Normal size. No focal lesions. Adrenals/Urinary Tract: No obvious adrenal gland metastasis. Kidneys are unremarkable. Stable cysts. Stomach/Bowel: Visualized portions within the abdomen are unremarkable. Vascular/Lymphatic: No pathologically enlarged lymph nodes identified. No abdominal aortic aneurysm demonstrated. Other:  Small volume abdominal ascites. Musculoskeletal: Diffuse sclerotic metastatic bone disease. IMPRESSION: 1. Diffuse/extensive metastatic disease throughout both lobes of the liver. 2. Gallbladder sludge and gallstones but no definite findings for acute cholecystitis. 3.  Normal caliber and course of the common bile duct. 4. No abdominal lymphadenopathy. 5. Diffuse sclerotic metastatic bone disease. 6. Small bilateral pleural effusions and overlying atelectasis. Electronically Signed   By: PMarijo SanesM.D.   On: 10/04/2019 05:31   CT Angio Chest/Abd/Pel for Dissection W and/or Wo Contrast  Result Date: 09/30/2019 CLINICAL DATA:  Abdominal pain, aortic dissection suspected, history of breast cancer EXAM: CT ANGIOGRAPHY CHEST, ABDOMEN AND PELVIS TECHNIQUE: Non-contrast CT of the chest was initially obtained. Multidetector CT imaging through the chest, abdomen and pelvis was performed using the standard protocol during bolus administration of  intravenous contrast. Multiplanar reconstructed images and MIPs were obtained and reviewed to evaluate the vascular anatomy. CONTRAST:  86m OMNIPAQUE IOHEXOL 350 MG/ML SOLN COMPARISON:  CT chest, abdomen and pelvis 09/28/2019 FINDINGS: CTA CHEST FINDINGS Cardiovascular: Initial noncontrast CT of the chest reveals an atherosclerotic thoracic aorta without hyperdense mural thickening or plaque displacement to suggest intramural hematoma. Postcontrast administration there is satisfactory, preferential opacification of the thoracic aorta. The aortic root is suboptimally assessed given cardiac pulsation artifact. Calcifications present on the aortic leaflets. Atherosclerotic plaque within the normal caliber aorta. No acute luminal abnormality of the imaged aorta. No periaortic stranding or hemorrhage. Shared origin of the brachiocephalic and left common carotid arteries. Atherosclerotic plaque in the proximal great vessels without acute luminal abnormality or occlusion. Normal heart size. No pericardial effusion. Three-vessel coronary artery atherosclerosis is noted. Central pulmonary arteries are normal caliber. No acute hyperdense central, lobar or proximal segmental filling defects on this non tailored examination of the pulmonary arteries.  Some calcification present in a segmental and subsegmental branches of the medial basal segment right lower lobe may reflect more chronic pulmonary embolic disease, unchanged from numerous priors. Mediastinum/Nodes: No mediastinal fluid or gas. Stable 7 mm hypoattenuating nodule in the right lobe thyroid gland. Not clinically significant and no further evaluation is warranted. No acute abnormality of the trachea. Small sliding-type hiatal hernia. Esophagus otherwise unremarkable. No worrisome mediastinal, hilar or axillary adenopathy. Lungs/Pleura: Some increasing basilar atelectatic changes most pronounced in the right lower lobe with additional bandlike areas of subsegmental atelectasis and/or scarring. No consolidation, features of edema, pneumothorax, or effusion. Stable appearance of a 3 mm nodule in the periphery of the left lower lobe. No other acute or concerning pulmonary nodules or masses. Musculoskeletal: Multiple ill-defined sclerotic foci present throughout the thoracic spine, bilateral ribs, and sternum. Multilevel degenerative changes are present in the imaged portions of the spine. Additional degenerative changes in the bilateral shoulders. Review of the MIP images confirms the above findings. CTA ABDOMEN AND PELVIS FINDINGS VASCULAR Aorta: Atherosclerotic plaque within the normal caliber aorta. No acute luminal abnormality. No aneurysm or ectasia. No periaortic stranding or hemorrhage. Celiac: Moderate ostial plaque narrowing with a second focus of narrowing in the proximal celiac access demonstrating a hook like configuration on sagittal reformation (9/122) suggesting some compression by the median arcuate ligament with mild poststenotic dilatation of the vessel up to 8 mm in diameter, smoothly tapering to a more normal appearance the otherwise normal branching pattern. SMA: Patent without evidence of aneurysm, dissection, vasculitis or significant stenosis. Renals: Single renal arteries  bilaterally. Mild bilateral ostial plaque narrowing. No evidence of aneurysm, dissection, vasculitis or fibromuscular dysplasia. IMA: Mild-to-moderate ostial plaque narrowing. Otherwise normal distal opacification and branching pattern. Inflow: Calcified noncalcified atheromatous plaque throughout the common, internal external iliac branches. No evidence of aneurysm, dissection or vasculitis. Proximal outflow vessels including the common, superficial and profundus femoral arteries with mild plaque but no significant stenosis, occlusion, dissection or features of vasculitis. Veins: No obvious venous abnormality within the limitations of this arterial phase study. Review of the MIP images confirms the above findings. NON-VASCULAR Hepatobiliary: Innumerable hypoattenuating lesions present throughout the liver including larger confluent lesions spanning segments 4, 5 and 8, largest again measuring up to 8.2 cm in size though poorly characterized compared to the portal venous study is performed recently (09/28/2019, 09/06/2019). Difficult to assess for new or enlarging lesions in this arterial phase exam. Nodular hepatic liver surface contour much of which is attributable to the subcapsular location of  many of these lesions. Persistent gallbladder distension with some increasingly conspicuous gallbladder wall thickening towards the fundus and adjacent pericholecystic inflammatory change (5/118). No visible calcified gallstones. No biliary ductal dilatation. Pancreas: Unremarkable. No pancreatic ductal dilatation or surrounding inflammatory changes. Spleen: Normal in size. No concerning splenic lesions. Adrenals/Urinary Tract: Redemonstration of a 1.2 cm nodule in the body of the right adrenal gland. No discernible left adrenal nodules. Fluid attenuation cyst again seen in the lower pole right kidney measuring up to 3.4 cm. No concerning renal mass, urolithiasis or hydronephrosis. Kidneys enhance symmetrically. Urinary  bladder is largely decompressed at the time of exam and therefore poorly evaluated by CT imaging. No gross bladder abnormality. Stomach/Bowel: Small sliding-type hiatal hernia. Stomach is unremarkable. Few air-filled duodenal diverticula. Few nonspecific fluid-filled though nondilated thickened loops of small bowel. No other conspicuous small bowel segments. Cecum is displaced anterior to the liver. A normal appendix is visualized. No colonic dilatation or wall thickening. Scattered colonic diverticula without focal inflammation to suggest diverticulitis. Lymphatic: No suspicious or enlarged lymph nodes in the included lymphatic chains. Reproductive: Multiple calcified uterine fibroids. Nonspecific endometrial thickening to 7 mm, greater than expected for this advanced age female. No concerning adnexal lesions. Other: Small volume of fluid in the abdomen predominantly within the subphrenic spaces, pericolic gutters and deep pelvis. Musculoskeletal: Background of multilevel degenerative changes in the spine hips and pelvis. Review of the MIP images confirms the above findings. IMPRESSION: Vascular 1. No evidence of acute aortic syndrome. 2.  Aortic Atherosclerosis (ICD10-I70.0). 3. Three-vessel coronary artery disease. 4. Moderate ostial plaque narrowing of the celiac, bilateral renal arteries and IMA. 5. Hook-like configuration of the celiac axis may reflect compression by the median arcuate ligament/arcuate ligament syndrome. Nonvascular 1. Increasingly conspicuous gallbladder wall thickening towards the fundus with some pericholecystic inflammation. Correlate with upper abdominal symptoms and consider right upper quadrant ultrasound for further evaluation. 2. Nonspecific fluid-filled appearance of the distal small bowel some questionable mucosal hyperemia. Correlate for features of enteritis. 3. Innumerable hypoattenuating lesions throughout the liver including larger confluent lesions spanning segments 4, 5 and  8, largest again measuring up to 8.2 cm in size though poorly characterized compared to the portal venous study is performed /2021, 09/06/2019). Difficult to assess for new or enlarging lesions in this arterial phase exam. 4. Developing ascites throughout the abdomen, could reflect hepatic dysfunction secondary to the extensive involvement of the liver versus reactive free fluid, correlate with serologies. 5. Small right adrenal nodule, concerning for metastasis. 6. Multiple ill-defined sclerotic foci throughout the thoracic spine, bilateral ribs, and sternum, concerning for osseous metastatic disease. 7. Nonspecific endometrial thickening to 7 mm, greater than expected for this advanced age female. Recommend further evaluation with nonemergent pelvic ultrasound. 8. Colonic diverticulosis without evidence of diverticulitis. 9. Small sliding-type hiatal hernia. 10. Aortic Atherosclerosis (ICD10-I70.0). Electronically Signed   By: Lovena Le M.D.   On: 09/30/2019 22:26   US ABDOMEN LIMITED RUQ  Result Date: 09/30/2019 CLINICAL DATA:  Right upper quadrant abdominal pain EXAM: ULTRASOUND ABDOMEN LIMITED RIGHT UPPER QUADRANT COMPARISON:  None. FINDINGS: Gallbladder: The gallbladder wall is thickened measuring approximately 5 mm. The sonographic Percell Miller sign is negative. There is cholelithiasis with gallbladder sludge. Common bile duct: Diameter: 8 mm Liver: Innumerable hepatic masses are noted throughout the liver. Portal vein is patent on color Doppler imaging with normal direction of blood flow towards the liver. Other: None. IMPRESSION: 1. Cholelithiasis without definite sonographic evidence for acute cholecystitis. 2. Mildly thickened gallbladder wall which is  nonspecific but can be seen in patients with underlying hepatocellular disease. 3. Innumerable hepatic masses are again noted, consistent with metastatic disease. Electronically Signed   By: Constance Holster M.D.   On: 09/30/2019 21:23    PERFORMANCE  STATUS (ECOG) : 3 - Symptomatic, >50% confined to bed  Review of Systems Unless otherwise noted, a complete review of systems is negative.  Physical Exam General: NAD Pulmonary: Unlabored Extremities: no edema, no joint deformities Skin: no rashes Neurological: Weakness but otherwise nonfocal  IMPRESSION: I met with patient and her friend following insertion of a Port-A-Cath.  Patient verbalized an understanding that her cancer is advanced and her prognosis poor.  She has decided to initiate chemotherapy although response rate is felt to be limited.  Patient says that she has started planning for her end-of-life and has decided to donate her body to [facility].  She does not have advance directives but is interested in establishing them.  We will ask the chaplain to assist with this.  Patient says that she has had end-of-life discussions with her husband.  We discussed CODE STATUS today.  Patient verbalized clearly that she would not want to be resuscitated nor have her life prolonged artificially on machines.  She says that if it were her end-of-life she would just want to be left in peace.  She was in agreement with DNR/DNI.  Her friend, who is a retired [employer] scientist, also agreed with that decision.  Patient verbalized that her husband would also be in agreement.  Symptomatically, patient denies any distressing symptoms at present.  Plan is for probable rehab at time of discharge from the hospital.  I would recommend the patient be followed at the SNF by palliative care.  This was discussed with patient and she was in agreement.  PLAN: -Continue current scope of treatment -DNR/DNI -Chaplain consult to assist with ACP -Probable rehab with palliative care following -Will follow  Case and plan discussed with Dr. Mike Gip   Patient expressed understanding and was in agreement with this plan. She also understands that She can call the clinic at any time with any questions, concerns, or  complaints.     Time Total: 60 minutes  Visit consisted of counseling and education dealing with the complex and emotionally intense issues of symptom management and palliative care in the setting of serious and potentially life-threatening illness.Greater than 50%  of this time was spent counseling and coordinating care related to the above assessment and plan.  Signed by: Altha Harm, PhD, NP-C

## 2019-10-05 NOTE — Op Note (Signed)
OPERATIVE NOTE   PROCEDURE: 1. Placement of a right IJ Infuse-a-Port  PRE-OPERATIVE DIAGNOSIS: Metastatic breast cancer  POST-OPERATIVE DIAGNOSIS: Same  SURGEON: Katha Cabal M.D.  ANESTHESIA: Conscious sedation was administered under my direct supervision by the interventional radiology RN. IV Versed plus fentanyl were utilized. Continuous ECG, pulse oximetry and blood pressure was monitored throughout the entire procedure. Conscious sedation was for a total of 45 minutes and 58 seconds.  ESTIMATED BLOOD LOSS: Minimal   FINDING(S): 1.  Patent vein  SPECIMEN(S): None  INDICATIONS:   Faith [name] is a 74 y.o. female who presents with metastatic breast cancer she will require chemotherapy and therefore appropriate parenteral access.  Risks and benefits for Infuse-a-Port placement have been reviewed all questions answered patient is agreed to proceed.  DESCRIPTION: After obtaining full informed written consent, the patient was brought back to the special procedure suite and placed in the supine position. The patient's right neck and chest wall are prepped and draped in sterile fashion. Appropriate timeout was called.  Ultrasound is placed in a sterile sleeve, ultrasound is utilized to avoid vascular injury as well as secondary to lack of appropriate landmarks. The right internal jugular vein is identified. It is echolucent and homogeneous as well as easily compressible indicating patency. An image is recorded for the permanent record.  Access to the vein with a micropuncture needle is done under direct ultrasound visualization.  1% lidocaine is infiltrated into the soft tissue at the base of the neck as well as on the chest wall.  Under direct ultrasound visualization a micro-needle is inserted into the vein followed by the micro-wire. Micro-sheath was then advanced and a J wire is inserted without difficulty under fluoroscopic guidance. A small counterincision was created at the wire  insertion site. A transverse incision is created 2 fingerbreadths below the scapula and a pocket is fashioned using both blunt and sharp dissection. The pocket is tested for appropriate size with the hub of the Infuse-a-Port. The tunneling device is then used to pull the intravascular portion of the catheter from the pocket to the neck counterincision.  Dilator and peel-away sheath were then inserted over the wire and the wire is removed. Catheter is then advanced into the venous system without difficulty. Peel-away sheath was then removed.  Catheter is then positioned under fluoroscopic guidance at the atrial caval junction. It is then transected connected to the hub and the hope is slipped into the subcutaneous pocket on the chest wall. The hub was then accessed percutaneously and aspirates easily and flushes well and is flushed with 30 cc of heparinized saline. The pocket incision is then closed in layers using interrupted 3-0 Vicryl for the subcutaneous tissues and 4-0 Monocryl subcuticular for skin closure. Dermabond is applied. The neck counterincision was closed with 4-0 Monocryl subcuticular and Dermabond as well.  The patient tolerated the procedure well and there were no immediate complications.  COMPLICATIONS: None  CONDITION: Unchanged  Katha Cabal M.D. [facility] Office: [phone]   10/05/2019, 3:24 PM

## 2019-10-05 NOTE — Progress Notes (Signed)
Pt back from port placement, alert and oriented, denies pain. No acute distress noted. Port site clean and intact. Staff will continue to monitor pt.

## 2019-10-05 NOTE — Care Management Important Message (Signed)
Important Message  Patient Details  Name: Faith [name] MRN: [id number] Date of Birth: Jan 16, 1946   Medicare Important Message Given:  Yes     Dannette Barbara 10/05/2019, 2:14 PM

## 2019-10-05 NOTE — H&P (View-Only) (Signed)
[facility] Hematology/Oncology Progress Note  Date of admission: 09/30/2019  Hospital day:  10/04/2019  Chief Complaint: Faith [name] is a 74 y.o. female with metastatic breast cancer who was admitted through the emergency room with hypotension, tachypnea, and hypothermia concerning for sepsis.  Subjective: Feeling better.  Poor appetite.  RUQ pain controlled.  Social History: The patient is alone today.  Allergies:  Allergies  Allergen Reactions  . Mushroom Extract Complex Hives and Swelling  . Nickel Rash  . Shellfish Allergy Nausea And Vomiting    Scheduled Medications: . vitamin C  1,000 mg Oral Daily  . cholecalciferol  5,000 Units Oral BID  . enoxaparin (LOVENOX) injection  40 mg Subcutaneous Q24H  . predniSONE  15 mg Oral Daily    Review of Systems: GENERAL:  Fatigue.  Denies fevers, chills or sweats. PERFORMANCE STATUS (ECOG):  2 HEENT:  No visual changes, runny nose, sore throat, mouth sores or tenderness. Lungs: No shortness of breath or cough.  No hemoptysis. Cardiac:  No chest pain, palpitations, orthopnea, or PND. GI:  RUQ pain controlled with oxycodone (notes makes sleepy).  Poor appetite.  No nausea, vomiting, diarrhea, constipation, melena or hematochezia. GU:  No urgency, frequency, dysuria, or hematuria. Musculoskeletal:  No back pain.  No joint pain.  No muscle tenderness. Extremities:  No pain or swelling. Skin:  No rashes or skin changes. Neuro:  No headache, numbness or weakness, balance or coordination issues. Endocrine: Diabetes.  No thyroid issues, hot flashes or night sweats. Psych:  Feels "ok".  No mood changes, depression or anxiety. Pain:  RUQ pain.  Denies bone pain. Review of systems:  All other systems reviewed and found to be negative.  Physical Exam: Blood pressure (!) 140/52, pulse 80, temperature (!) 97.5 F (36.4 C), temperature source Oral, resp. rate 18, height '5\' 7"'  (1.702 m), weight 218 lb 9.6 oz (99.2 kg),  SpO2 97 %.  GENERAL:  Fatigued appearing woman lying comfortably on the medical unit in no acute distress. MENTAL STATUS:  Alert and oriented to person, place and time. HEAD:  Shoulder length hair.  Normocephalic, atraumatic, face symmetric, no Cushingoid features. EYES:  Pupils equal round and reactive to light and accomodation.  No conjunctivitis or scleral icterus. ENT:  Oropharynx clear without lesion.  Tongue normal. Mucous membranes dry.  RESPIRATORY:  Clear to auscultation anteriorly without rales, wheezes or rhonchi. CARDIOVASCULAR:  Regular rate and rhythm without murmur, rub or gallop. ABDOMEN:  Soft, slight tenderness in RUQ without guarding or rebound.  Liver palpable.  Active bowel sounds and no splenomegaly.  SKIN:  No rashes, ulcers or lesions. EXTREMITIES: Chronic lower extremity changes.  No tenderness.  No palpable cords. NEUROLOGICAL: Unremarkable. PSYCH:  Appropriate.   Results for orders placed or performed during the hospital encounter of 09/30/19 (from the past 48 hour(s))  Comprehensive metabolic panel     Status: Abnormal   Collection Time: 10/04/19  6:05 AM  Result Value Ref Range   Sodium 142 135 - 145 mmol/L   Potassium 3.6 3.5 - 5.1 mmol/L   Chloride 113 (H) 98 - 111 mmol/L   CO2 18 (L) 22 - 32 mmol/L   Glucose, Bld 103 (H) 70 - 99 mg/dL    Comment: Glucose reference range applies only to samples taken after fasting for at least 8 hours.   BUN 24 (H) 8 - 23 mg/dL   Creatinine, Ser 1.39 (H) 0.44 - 1.00 mg/dL   Calcium 8.8 (L) 8.9 - 10.3  mg/dL   Total Protein 5.2 (L) 6.5 - 8.1 g/dL   Albumin 2.2 (L) 3.5 - 5.0 g/dL   AST 287 (H) 15 - 41 U/L   ALT 172 (H) 0 - 44 U/L   Alkaline Phosphatase 681 (H) 38 - 126 U/L   Total Bilirubin 3.1 (H) 0.3 - 1.2 mg/dL   GFR calc non Af Amer 37 (L) >60 mL/min   GFR calc Af Amer 43 (L) >60 mL/min   Anion gap 11 5 - 15    Comment: Performed at [facility], [address] [place], [place] [address]   MR 3D Recon  At Scanner  Result Date: 10/04/2019 CLINICAL DATA:  Abdominal pain. Metastatic hepatic disease. EXAM: MRI ABDOMEN WITHOUT AND WITH CONTRAST (INCLUDING MRCP) TECHNIQUE: Multiplanar multisequence MR imaging of the abdomen was performed both before and after the administration of intravenous contrast. Heavily T2-weighted images of the biliary and pancreatic ducts were obtained, and three-dimensional MRCP images were rendered by post processing. CONTRAST:  46m GADAVIST GADOBUTROL 1 MMOL/ML IV SOLN COMPARISON:  CT scan 09/30/2019 and 09/28/2019 FINDINGS: Lower chest: The lung bases are grossly clear. Small bilateral pleural effusions and overlying atelectasis. Hepatobiliary: Diffuse/extensive metastatic disease throughout both lobes of the liver. The gallbladder contains sludge in gallstones and is slightly distended but no obvious wall thickening or pericholecystic fluid. Normal caliber and course of the common bile duct. Pancreas:  No mass, inflammation or ductal dilatation. Spleen:  Normal size. No focal lesions. Adrenals/Urinary Tract: No obvious adrenal gland metastasis. Kidneys are unremarkable. Stable cysts. Stomach/Bowel: Visualized portions within the abdomen are unremarkable. Vascular/Lymphatic: No pathologically enlarged lymph nodes identified. No abdominal aortic aneurysm demonstrated. Other:  Small volume abdominal ascites. Musculoskeletal: Diffuse sclerotic metastatic bone disease. IMPRESSION: 1. Diffuse/extensive metastatic disease throughout both lobes of the liver. 2. Gallbladder sludge and gallstones but no definite findings for acute cholecystitis. 3. Normal caliber and course of the common bile duct. 4. No abdominal lymphadenopathy. 5. Diffuse sclerotic metastatic bone disease. 6. Small bilateral pleural effusions and overlying atelectasis. Electronically Signed   By: PMarijo SanesM.D.   On: 10/04/2019 05:31   MR ABDOMEN MRCP W WO CONTAST  Result Date: 10/04/2019 CLINICAL DATA:  Abdominal pain.  Metastatic hepatic disease. EXAM: MRI ABDOMEN WITHOUT AND WITH CONTRAST (INCLUDING MRCP) TECHNIQUE: Multiplanar multisequence MR imaging of the abdomen was performed both before and after the administration of intravenous contrast. Heavily T2-weighted images of the biliary and pancreatic ducts were obtained, and three-dimensional MRCP images were rendered by post processing. CONTRAST:  976mGADAVIST GADOBUTROL 1 MMOL/ML IV SOLN COMPARISON:  CT scan 09/30/2019 and 09/28/2019 FINDINGS: Lower chest: The lung bases are grossly clear. Small bilateral pleural effusions and overlying atelectasis. Hepatobiliary: Diffuse/extensive metastatic disease throughout both lobes of the liver. The gallbladder contains sludge in gallstones and is slightly distended but no obvious wall thickening or pericholecystic fluid. Normal caliber and course of the common bile duct. Pancreas:  No mass, inflammation or ductal dilatation. Spleen:  Normal size. No focal lesions. Adrenals/Urinary Tract: No obvious adrenal gland metastasis. Kidneys are unremarkable. Stable cysts. Stomach/Bowel: Visualized portions within the abdomen are unremarkable. Vascular/Lymphatic: No pathologically enlarged lymph nodes identified. No abdominal aortic aneurysm demonstrated. Other:  Small volume abdominal ascites. Musculoskeletal: Diffuse sclerotic metastatic bone disease. IMPRESSION: 1. Diffuse/extensive metastatic disease throughout both lobes of the liver. 2. Gallbladder sludge and gallstones but no definite findings for acute cholecystitis. 3. Normal caliber and course of the common bile duct. 4. No abdominal lymphadenopathy. 5. Diffuse  sclerotic metastatic bone disease. 6. Small bilateral pleural effusions and overlying atelectasis. Electronically Signed   By: Marijo Sanes M.D.   On: 10/04/2019 05:31    Assessment:  Faith [name] is a 74 y.o. female with metastatic breast cancer who was admitted through the emergency room with weakness, lightheadedness and  RUQ abdominal pain. Ultrasound guided liver biopsy on 09/18/2019 confirmed metastatic breast cancer (ER+, PR-, and Her2/neu) -.  Chest, abdomen, and pelvis CT angiogram on 09/30/2019 revealed increasingly conspicuous gallbladder wall thickening with some pericholecystic inflammation, nonspecific fluid-filled distal small bowel, innumerable liver lesions, and developing ascites (? secondary to liver dysfunction), small right adrenal nodule, bone metastasis, and nonspecific endometrial thickening..  Abdomen MRCP on 10/03/2019 revealed diffuse/extensive metastatic disease throughout both lobes of the liver.  There was gallbladder sludge and gallstones but no definite findings for acute cholecystitis. The common bile duct was normal caliber.  There was no abdominal lymphadenopathy, but diffuse sclerotic metastatic bone disease.  There were small bilateral pleural effusions and overlying atelectasis.  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  She has a history of hypercalcemia (11.7 corrected).  She was treated with Zometa on 09/26/2019.   Symptomatically, she is feeling better.  She has been afebrile and normotensive.  Pain is well controlled.  Plan:   1.  Metastatic breast cancer             Patient has significant metastatic disease to the liver confirmed by liver biopsy on 09/18/2019.             Discuss recent MRCP- no evidence of biliary obstruction but extensive liver disease.             Elevated alkaline phosphatase secondary to liver and bone metastasis.                         Discuss plan for outpatient bone scan to fully assess bone disease.             Review increasing liver function tests and limited window of opportunity to treat based on Taxol metabolism.   Potential side effects reviewed in detail.              Patient wishes to pursue therapy if possible.  Port-a-cath clearance given for placement on 10/05/2019. 2.  R/o sepsis             WBC increased  from 13,200 to 20,800 on 10/01/2019 then 10,000 on 10/02/2019.                         Patient received hydrocortisone 5 hours before labs on 10/01/2019.                        Patient has presented twice in the past week with hypotension.             Source of possible infection was unclear.                          Paracentesis unable to be performed secondary to small amount of fluid.   No biliary obstruction.  Appreciate ID consultation.  All cultures negative.  AM cortisol level on 10/03/2019 was normal.  Antibiotics discontinued. 3.   Disposition  Patient notes plan for SNF.  Anticipate follow-up in clinic on 10/08/2019 for initiation of weekly Taxol (  unless given while hospitalized).   Lequita Asal, MD  10/05/2019, 7:44 AM

## 2019-10-05 NOTE — Progress Notes (Signed)
Mobility Specialist - Progress Note   10/05/19 1223  Mobility  Activity Contraindicated/medical hold  Mobility performed by Mobility specialist     Per discussion w/ nurse, pt will be out of room for procedure and chemo. Nurse request to hold off session. Will re-attempt at a later date/time when appropriate.     Maryan Sivak Mobility Specialist  10/05/19, 12:25 PM

## 2019-10-05 NOTE — Progress Notes (Signed)
[facility] Hematology/Oncology Progress Note  Date of admission: 09/30/2019  Hospital day:  10/05/2019   Chief Complaint: Faith [name] is a 74 y.o. female with metastatic breast cancer who was admitted through the emergency room with hypotension, tachypnea, and hypothermia concerning for sepsis.  Subjective: Feeling "better and worse".  She denies any pain.  She noted nausea this morning.  Social History: The patient is accompanied by her husband today.  Allergies:  Allergies  Allergen Reactions  . Mushroom Extract Complex Hives and Swelling  . Nickel Rash  . Shellfish Allergy Nausea And Vomiting    Scheduled Medications: . [MAR Hold] vitamin C  1,000 mg Oral Daily  . [START ON 10/06/2019] Chlorhexidine Gluconate Cloth  6 each Topical Q0600  . [MAR Hold] cholecalciferol  5,000 Units Oral BID  . [MAR Hold] enoxaparin (LOVENOX) injection  40 mg Subcutaneous Q24H  . fentaNYL      . midazolam      . [MAR Hold] predniSONE  15 mg Oral Daily    Review of Systems  Constitutional: Positive for malaise/fatigue. Negative for chills, diaphoresis and fever.  HENT: Negative.  Negative for congestion, ear pain, hearing loss, nosebleeds and sore throat.   Eyes: Negative.  Negative for blurred vision, double vision and photophobia.  Respiratory: Negative.  Negative for cough, hemoptysis, sputum production and shortness of breath.   Cardiovascular: Negative.  Negative for chest pain, palpitations, orthopnea and PND.  Gastrointestinal: Positive for abdominal pain (well controlled) and nausea. Negative for blood in stool, constipation, diarrhea, heartburn, melena and vomiting.       Poor appetite.  Genitourinary: Negative.  Negative for dysuria, frequency, hematuria and urgency.  Musculoskeletal: Negative.  Negative for back pain, myalgias and neck pain.  Skin: Negative.  Negative for itching and rash.  Neurological: Positive for weakness (generalized). Negative for  dizziness, tingling, tremors, sensory change, speech change, focal weakness and headaches.  Endo/Heme/Allergies: Negative.  Does not bruise/bleed easily.  Psychiatric/Behavioral: Negative.  Negative for memory loss. The patient is not nervous/anxious and does not have insomnia.    PERFORMANCE STATUS (ECOG):  2-3  Physical Exam Constitutional:      General: She is not in acute distress.    Appearance: She is not toxic-appearing or diaphoretic.     Comments: Fatigued appearing woman lying in bed in no acute distress.  Her husband is at her side.  HENT:     Head: Normocephalic and atraumatic.     Mouth/Throat:     Mouth: Mucous membranes are moist.  Eyes:     General: Scleral icterus (slight) present.     Extraocular Movements: Extraocular movements intact.     Pupils: Pupils are equal, round, and reactive to light.  Cardiovascular:     Rate and Rhythm: Normal rate and regular rhythm.     Heart sounds: No murmur heard.  No gallop.   Pulmonary:     Effort: Pulmonary effort is normal. No respiratory distress.     Breath sounds: Normal breath sounds. No wheezing or rales.  Abdominal:     General: Bowel sounds are normal. There is no distension.     Palpations: There is hepatomegaly (slightly tender). There is no splenomegaly.  Skin:    General: Skin is warm and dry.     Coloration: Skin is not jaundiced or pale.     Findings: No erythema or rash.  Neurological:     General: No focal deficit present.     Mental Status: She  is alert and oriented to person, place, and time.     Cranial Nerves: No cranial nerve deficit.     Motor: No weakness.  Psychiatric:        Mood and Affect: Mood normal.        Behavior: Behavior normal.    Results for orders placed or performed during the hospital encounter of 09/30/19 (from the past 48 hour(s))  Comprehensive metabolic panel     Status: Abnormal   Collection Time: 10/04/19  6:05 AM  Result Value Ref Range   Sodium 142 135 - 145 mmol/L    Potassium 3.6 3.5 - 5.1 mmol/L   Chloride 113 (H) 98 - 111 mmol/L   CO2 18 (L) 22 - 32 mmol/L   Glucose, Bld 103 (H) 70 - 99 mg/dL    Comment: Glucose reference range applies only to samples taken after fasting for at least 8 hours.   BUN 24 (H) 8 - 23 mg/dL   Creatinine, Ser 1.39 (H) 0.44 - 1.00 mg/dL   Calcium 8.8 (L) 8.9 - 10.3 mg/dL   Total Protein 5.2 (L) 6.5 - 8.1 g/dL   Albumin 2.2 (L) 3.5 - 5.0 g/dL   AST 287 (H) 15 - 41 U/L   ALT 172 (H) 0 - 44 U/L   Alkaline Phosphatase 681 (H) 38 - 126 U/L   Total Bilirubin 3.1 (H) 0.3 - 1.2 mg/dL   GFR calc non Af Amer 37 (L) >60 mL/min   GFR calc Af Amer 43 (L) >60 mL/min   Anion gap 11 5 - 15    Comment: Performed at [facility], [address] [place], [place] [address]  Comprehensive metabolic panel     Status: Abnormal   Collection Time: 10/05/19  7:05 AM  Result Value Ref Range   Sodium 142 135 - 145 mmol/L   Potassium 4.1 3.5 - 5.1 mmol/L    Comment: HEMOLYSIS AT THIS LEVEL MAY AFFECT RESULT   Chloride 114 (H) 98 - 111 mmol/L   CO2 16 (L) 22 - 32 mmol/L   Glucose, Bld 88 70 - 99 mg/dL    Comment: Glucose reference range applies only to samples taken after fasting for at least 8 hours.   BUN 23 8 - 23 mg/dL   Creatinine, Ser 1.26 (H) 0.44 - 1.00 mg/dL   Calcium 9.0 8.9 - 10.3 mg/dL   Total Protein 5.2 (L) 6.5 - 8.1 g/dL   Albumin 2.1 (L) 3.5 - 5.0 g/dL   AST 311 (H) 15 - 41 U/L    Comment: HEMOLYSIS AT THIS LEVEL MAY AFFECT RESULT   ALT 171 (H) 0 - 44 U/L   Alkaline Phosphatase 754 (H) 38 - 126 U/L   Total Bilirubin 3.3 (H) 0.3 - 1.2 mg/dL    Comment: HEMOLYSIS AT THIS LEVEL MAY AFFECT RESULT   GFR calc non Af Amer 42 (L) >60 mL/min   GFR calc Af Amer 48 (L) >60 mL/min   Anion gap 12 5 - 15    Comment: Performed at [facility], [address] [place], [place] [address]  CBC     Status: Abnormal   Collection Time: 10/05/19  7:05 AM  Result Value Ref Range   WBC 16.5 (H) 4.0 - 10.5 K/uL   RBC 4.55  3.87 - 5.11 MIL/uL   Hemoglobin 13.1 12.0 - 15.0 g/dL   HCT 38.4 36 - 46 %   MCV 84.4 80.0 - 100.0 fL   MCH 28.8  26.0 - 34.0 pg   MCHC 34.1 30.0 - 36.0 g/dL   RDW 17.0 (H) 11.5 - 15.5 %   Platelets 123 (L) 150 - 400 K/uL    Comment: Immature Platelet Fraction may be clinically indicated, consider ordering this additional test [id number]    nRBC 1.8 (H) 0.0 - 0.2 %    Comment: Performed at [facility], [address] [place], [place] [address]  CBC with Differential/Platelet     Status: Abnormal   Collection Time: 10/05/19  7:05 AM  Result Value Ref Range   WBC 15.7 (H) 4.0 - 10.5 K/uL   RBC 4.67 3.87 - 5.11 MIL/uL   Hemoglobin 13.6 12.0 - 15.0 g/dL   HCT 40.7 36 - 46 %   MCV 87.2 80.0 - 100.0 fL   MCH 29.1 26.0 - 34.0 pg   MCHC 33.4 30.0 - 36.0 g/dL   RDW 16.7 (H) 11.5 - 15.5 %   Platelets 110 (L) 150 - 400 K/uL    Comment: Immature Platelet Fraction may be clinically indicated, consider ordering this additional test [id number]    nRBC 2.3 (H) 0.0 - 0.2 %   Neutrophils Relative % 74 %   Neutro Abs 11.6 (H) 1.7 - 7.7 K/uL   Lymphocytes Relative 9 %   Lymphs Abs 1.4 0.7 - 4.0 K/uL   Monocytes Relative 5 %   Monocytes Absolute 0.8 0 - 1 K/uL   Eosinophils Relative 0 %   Eosinophils Absolute 0.0 0 - 0 K/uL   Basophils Relative 1 %   Basophils Absolute 0.1 0 - 0 K/uL   WBC Morphology MILD LEFT SHIFT (1-5% METAS, OCC MYELO, OCC BANDS)    RBC Morphology MORPHOLOGY UNREMARKABLE    Smear Review Normal platelet morphology    Immature Granulocytes 11 %   Abs Immature Granulocytes 1.79 (H) 0.00 - 0.07 K/uL    Comment: Performed at [facility], [address] [place], [place] [address]   MR 3D Recon At Scanner  Result Date: 10/04/2019 CLINICAL DATA:  Abdominal pain. Metastatic hepatic disease. EXAM: MRI ABDOMEN WITHOUT AND WITH CONTRAST (INCLUDING MRCP) TECHNIQUE: Multiplanar multisequence MR imaging of the abdomen was performed both before and after the  administration of intravenous contrast. Heavily T2-weighted images of the biliary and pancreatic ducts were obtained, and three-dimensional MRCP images were rendered by post processing. CONTRAST:  71m GADAVIST GADOBUTROL 1 MMOL/ML IV SOLN COMPARISON:  CT scan 09/30/2019 and 09/28/2019 FINDINGS: Lower chest: The lung bases are grossly clear. Small bilateral pleural effusions and overlying atelectasis. Hepatobiliary: Diffuse/extensive metastatic disease throughout both lobes of the liver. The gallbladder contains sludge in gallstones and is slightly distended but no obvious wall thickening or pericholecystic fluid. Normal caliber and course of the common bile duct. Pancreas:  No mass, inflammation or ductal dilatation. Spleen:  Normal size. No focal lesions. Adrenals/Urinary Tract: No obvious adrenal gland metastasis. Kidneys are unremarkable. Stable cysts. Stomach/Bowel: Visualized portions within the abdomen are unremarkable. Vascular/Lymphatic: No pathologically enlarged lymph nodes identified. No abdominal aortic aneurysm demonstrated. Other:  Small volume abdominal ascites. Musculoskeletal: Diffuse sclerotic metastatic bone disease. IMPRESSION: 1. Diffuse/extensive metastatic disease throughout both lobes of the liver. 2. Gallbladder sludge and gallstones but no definite findings for acute cholecystitis. 3. Normal caliber and course of the common bile duct. 4. No abdominal lymphadenopathy. 5. Diffuse sclerotic metastatic bone disease. 6. Small bilateral pleural effusions and overlying atelectasis. Electronically Signed   By: PMarijo SanesM.D.   On: 10/04/2019 05:31  MR ABDOMEN MRCP W WO CONTAST  Result Date: 10/04/2019 CLINICAL DATA:  Abdominal pain. Metastatic hepatic disease. EXAM: MRI ABDOMEN WITHOUT AND WITH CONTRAST (INCLUDING MRCP) TECHNIQUE: Multiplanar multisequence MR imaging of the abdomen was performed both before and after the administration of intravenous contrast. Heavily T2-weighted images of  the biliary and pancreatic ducts were obtained, and three-dimensional MRCP images were rendered by post processing. CONTRAST:  22m GADAVIST GADOBUTROL 1 MMOL/ML IV SOLN COMPARISON:  CT scan 09/30/2019 and 09/28/2019 FINDINGS: Lower chest: The lung bases are grossly clear. Small bilateral pleural effusions and overlying atelectasis. Hepatobiliary: Diffuse/extensive metastatic disease throughout both lobes of the liver. The gallbladder contains sludge in gallstones and is slightly distended but no obvious wall thickening or pericholecystic fluid. Normal caliber and course of the common bile duct. Pancreas:  No mass, inflammation or ductal dilatation. Spleen:  Normal size. No focal lesions. Adrenals/Urinary Tract: No obvious adrenal gland metastasis. Kidneys are unremarkable. Stable cysts. Stomach/Bowel: Visualized portions within the abdomen are unremarkable. Vascular/Lymphatic: No pathologically enlarged lymph nodes identified. No abdominal aortic aneurysm demonstrated. Other:  Small volume abdominal ascites. Musculoskeletal: Diffuse sclerotic metastatic bone disease. IMPRESSION: 1. Diffuse/extensive metastatic disease throughout both lobes of the liver. 2. Gallbladder sludge and gallstones but no definite findings for acute cholecystitis. 3. Normal caliber and course of the common bile duct. 4. No abdominal lymphadenopathy. 5. Diffuse sclerotic metastatic bone disease. 6. Small bilateral pleural effusions and overlying atelectasis. Electronically Signed   By: PMarijo SanesM.D.   On: 10/04/2019 05:31    Assessment:  DVIANCA BRACHERis a 74y.o. female with metastatic breast cancer who was admitted through the emergency room with weakness, lightheadedness and RUQ abdominal pain. Ultrasound guided liver biopsy on 09/18/2019 confirmed metastatic breast cancer (ER+, PR-, and Her2/neu) -.  Chest, abdomen, and pelvis CT angiogram on 09/30/2019 revealed increasingly conspicuous gallbladder wall thickening with some  pericholecystic inflammation, nonspecific fluid-filled distal small bowel, innumerable liver lesions, and developing ascites (? secondary to liver dysfunction), small right adrenal nodule, bone metastasis, and nonspecific endometrial thickening.  Abdomen MRCP on 10/03/2019 revealed diffuse/extensive metastatic disease throughout both lobes of the liver.  There was gallbladder sludge and gallstones but no definite findings for acute cholecystitis. The common bile duct was normal caliber.  There was no abdominal lymphadenopathy, but diffuse sclerotic metastatic bone disease.  There were small bilateral pleural effusions and overlying atelectasis.  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  She has a history of hypercalcemia (11.7 corrected).  She was treated with Zometa on 09/26/2019.   Symptomatically, she has had some nausea today.  She denies any pain.  She wishes to proceed with treatment.  Plan:   1.  Metastatic breast cancer  Today I spoke with the patient and her husband about her metastatic breast cancer.  She has had rapidly progressive disease.  She has extensive disease in her liver.  Liver function tests are markedly elevated and continue to increase daily.    I reviewed my conversation with the patient last night. She wished to proceed with chemotherapy.  We discussed the window of opportunity to treat based on her liver function tests.   Chemotherapy will be dose reduced.  We discussed potential increase in LFTs and myelosuppression secondary to chemotherapy.  We discussed consideration of growth factor support.  We discussed that treatment is palliative.  She is critically ill.  Treatment is not curative.  Management of her disease is estimated at 5-10%.  Treatment is weekly and adjustment will be made in future doses of Taxol based on her tolerance, blood counts, and LFTs.  Multiple questions asked and answered.  Patient and her husband adamantly wish  to pursue chemotherapy despite the slim chance of success.  I again discussed palliative care and meeting with Altha Harm, NP today.   Last night and today, the patient was in agreement to meet with Billey Chang, NP.  Multiple questions asked and answered. 2.  R/o sepsis             WBC increased from 10,000 on 10/02/2019 to 16,500 today.  No current evidence of infection.  Etiology felt secondary to recent reinstitution of steroids.  Discussed with ID. 3.   Port-a-cath  Patient taken for port-a-cath placement after our discussion. 4.   Disposition  Patient notes that she will be going to a rehab facility after discharge.  She states that she has not picked out one.  I discuss the need for follow-up in the outpatient department next week (Monday).   Lequita Asal, MD  10/05/2019, 1:45 PM   Over 60 minutes was spent coordinating care (patient's nurse, vascular surgery, infusion center charge nurse, pharmacy, and Dr Si Raider) as well as discussing treatment plans with the patient and her husband.  Dr Grayland Ormond is on call this weekend.

## 2019-10-05 NOTE — Progress Notes (Signed)
Patient is 74yo F with metastatic breast cancer with liver involvement. Liver enzymes elevated. Discussed with MD. Madaline Brilliant to treat with current liver enzymes with lowered dose of Taxol 30mg /m2.

## 2019-10-05 NOTE — Interval H&P Note (Signed)
History and Physical Interval Note:  10/05/2019 1:56 PM  Faith [name]  has presented today for surgery, with the diagnosis of Cancer.  The various methods of treatment have been discussed with the patient and family. After consideration of risks, benefits and other options for treatment, the patient has consented to  Procedure(s): PORTA CATH INSERTION (N/A) as a surgical intervention.  The patient's history has been reviewed, patient examined, no change in status, stable for surgery.  I have reviewed the patient's chart and labs.  Questions were answered to the patient's satisfaction.     Hortencia Pilar

## 2019-10-05 NOTE — Progress Notes (Addendum)
   10/05/19 1700  Clinical Encounter Type  Visited With Patient and family together  Visit Type Initial  Referral From Nurse  Consult/Referral To Chaplain  Chaplain responded to AD OR. When chaplain arrived at the room, she found pt's friend, Ms. Dotson at the bedside. Chaplain left form with her and explained explained that it will tell everyone what pt wants done in the event that she can not speak for herself. Ms. Mariana Arn said ok and she and pt's husband will be in tomorrow. Chaplain told her if they have any questions to have chaplain paged.

## 2019-10-06 DIAGNOSIS — G9340 Encephalopathy, unspecified: Secondary | ICD-10-CM

## 2019-10-06 LAB — COMPREHENSIVE METABOLIC PANEL
ALT: 153 U/L — ABNORMAL HIGH (ref 0–44)
AST: 333 U/L — ABNORMAL HIGH (ref 15–41)
Albumin: 2.3 g/dL — ABNORMAL LOW (ref 3.5–5.0)
Alkaline Phosphatase: 939 U/L — ABNORMAL HIGH (ref 38–126)
Anion gap: 13 (ref 5–15)
BUN: 25 mg/dL — ABNORMAL HIGH (ref 8–23)
CO2: 16 mmol/L — ABNORMAL LOW (ref 22–32)
Calcium: 8.8 mg/dL — ABNORMAL LOW (ref 8.9–10.3)
Chloride: 112 mmol/L — ABNORMAL HIGH (ref 98–111)
Creatinine, Ser: 1.13 mg/dL — ABNORMAL HIGH (ref 0.44–1.00)
GFR calc Af Amer: 55 mL/min — ABNORMAL LOW (ref >60)
GFR calc non Af Amer: 48 mL/min — ABNORMAL LOW (ref >60)
Glucose, Bld: 139 mg/dL — ABNORMAL HIGH (ref 70–99)
Potassium: 3.9 mmol/L (ref 3.5–5.1)
Sodium: 141 mmol/L (ref 135–145)
Total Bilirubin: 3.8 mg/dL — ABNORMAL HIGH (ref 0.3–1.2)
Total Protein: 5.4 g/dL — ABNORMAL LOW (ref 6.5–8.1)

## 2019-10-06 MED ORDER — METOPROLOL TARTRATE 25 MG PO TABS
25.0000 mg | ORAL_TABLET | Freq: Two times a day (BID) | ORAL | Status: DC
  Administered 2019-10-06 – 2019-10-10 (×9): 25 mg via ORAL
  Filled 2019-10-06 (×9): qty 1

## 2019-10-06 NOTE — Progress Notes (Signed)
Mobility Specialist - Progress Note   10/06/19 1302  Mobility  Activity  (bed exercises)  Level of Assistance Standby assist, set-up cues, supervision of patient - no hands on  Mobility Response Tolerated well  Mobility performed by Mobility specialist  $Mobility charge 1 Mobility    Pre-mobility: 115 HR, 150/68 BP, 95% SpO2 During mobility: 122 HR, 94% SpO2 Post-mobility: 119 HR, 153/75 BP, 95% SpO2   Pt laying in bed upon arrival. Pt agreed to session. Pt performed bed exercises: ankle pumps x 10, heel slides x 5, quad sets x 5. SBA for supervision during session. Further mobility limited d/t pt feeling "tired" and lethargic. Overall, pt tolerated session well. Pt remained in bed w/ lunch set-up on tray in front of her. Bed alarm set and all needs placed in reach. Nurse was notified.    Tabbatha Bordelon Mobility Specialist  10/06/19, 1:07 PM

## 2019-10-06 NOTE — Progress Notes (Signed)
Called to examine portacath site for concern of bleeding/drainage. Upon my exam- small hematoma with ecchymosis over port. Soft, nontender. No drainage, functioning well.  Continue to use. Will monitor.

## 2019-10-06 NOTE — Progress Notes (Signed)
PROGRESS NOTE    [name]   YWV:[id number]  DOB: 04-Jul-1945  PCP: Leonel Ramsay, MD    DOA: 09/30/2019 LOS: 6   Brief Narrative   [name], type diabetes mellitus, hypertension, dyslipidemia, [name] She stated that she felt fine when she went home yesterday till this afternoon when she felt weak and dizzy and could not stand or walk. She admitted to associated abdominal pain epigastric and right upper quadrant area. She denied any nausea or vomiting or diarrhea. No fever or chills. No chest pain or dyspnea or cough or wheezing. No dysuria, oliguria, urgency or frequency or flank pain.  Upon presentation to the emergency room, hypertension was 89.44F/31.7C,blood pressure 72/44 with a heart rate of 96 respiratory to 24 and pulse 70 was 98% on room air.With clear bolus of IV normal saline her blood pressure was up to 130/48. Labs were remarkable for anion gap of 16, magnesium of 1.9 with a potassium of 3.8 BUN of 17 with a creatinine of 1.54 compared to 21 and 0.97 on 9/11, high-sensitivity troponin I 160 and later 111, AST 358 ALT of 218 and alk phos of 900 with total bili of 2.2 albumin of 3 and total protein of 6.5.The patient's alk phos was 669 on 9/10 with total bili of 2.3, AST is 225 and ALT 177. Lactic acid came back 5.9 and later down to 4.4 from 1.8 on 9/10. CBC showed leukocytosis of 13.2 with neutrophilia as well as thrombocytopenia of 135. WBCs were 10 on 9/11. At 500 mL D-dimer was more than 7500, Tylenol level 10 and salicylate less than 7. TSH was 4.2 and free T4 1.37 both slightly elevated. Blood cultures were  drawn. EKG showed sinus rhythm with rate of 98 with occasional PVCs and possible left atrial enlargement. Right upper quadrant ultrasound revealed the following: 1. Cholelithiasis without definite sonographic evidence for acute cholecystitis. 2. Mildly thickened gallbladder wall which is nonspecific but can be seen in patients with underlying hepatocellular disease. 3. Innumerable hepatic masses are again noted, consistent with metastatic disease.  The patient was given IV cefepime and Flagyl, IV Zofran and 3 L bolus of IV normal saline. She will be admitted to a progressive unit bed for further evaluation and management.   Assessment & Plan   Active Problems:   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 female, estrogen receptor positive ([place])   Elevated LFTs   Bone metastasis (HCC)   Liver metastasis (HCC)   HLD (hyperlipidemia)   Acute renal failure superimposed on stage 3a chronic kidney disease (HCC)   Sepsis (HCC)   Leukocytosis   Palliative care encounter   1. SIRS.  No clear source of infection..  She did present with hypotension, elevated lactate, hypothermia and tachypnea.  No evidence of pneumonia on chest imaging.  Treated with IV abx 9/13-9/16. Cultures negative.   Overall hemodynamics have improved with IV fluids. Appreciate ID recs, noticed was not continued hom home prednisone, have restarted that. AM cortisol wnl and hemodynamics have improved, will hold on suggested acth stim test for now.  Initial plans were for paracentesis to rule out SBP, but she does not have significant ascites to warrant paracentesis. MRCP  w/o signs infection. Now providing supportive care. 2. Elevated liver enzymes.  Initially, based on imaging there was concern for cholecystitis.  Seen by general surgery high-dose felt unlikely that patient had cholecystitis and they have signed off.  She is also being seen by GI and it is thought that her elevated liver enzymes likely related to  underlying cancer. MRCP w/o signs obstruction. 3. Discharge planning: care mgmt says does not qualify for skilled nursing given ongoing chemo. Plan will have to be home with home health. Care mgmt to meet w/ family today to start that process. 4. Acute kidney injury on chronic kidney disease stage III.  Likely related to hypotension.  resolved. Stopping IV fluids today. 5. Left breast cancer with metastases to liver and bone.  Oncology consulted. Port placed 9/17, weekly chemo began 9/17 6. Swelling at port-a-cath site: vascular surgery to eval 7. CAD.  Holding statin in light of elevated liver enzymes. Resuming home metoprolol 8. Leukocytosis.  Possibly reactive to #1.  She also received a dose of intravenous steroids while she was hypotensive.  She is currently afebrile.  Continue to monitor. 9. Hx GCA - resumed home prednisone 10. History of hypertension. bp elevated today, resuming home metoprolol    Obesity: Body mass index is 34.25 kg/m.  Complicates overall care and prognosis.  DVT prophylaxis: enoxaparin (LOVENOX) injection 40 mg Start: 10/01/19 0000   Diet:  Diet Orders (From admission, onward)    Start     Ordered   10/05/19 1537  Diet regular Room service appropriate? Yes; Fluid consistency: Thin  Diet effective now       Question Answer Comment  Room service appropriate? Yes   Fluid consistency: Thin      10/05/19 1536            Code Status: DNR    Subjective 10/06/19    Patient seen at bedside this PM.  Alert, no complaints. Swelling at port-a-cath site. No emesis  Disposition Plan & Communication   Status is: Inpatient  Remains inpatient appropriate because:Ongoing diagnostic testing needed not appropriate for outpatient work up   Dispo: The patient is from: Home              Anticipated d/c is to: home with home health              Anticipated d/c date is: 1-2 days              Patient currently is not medically stable to d/c.        Family  Communication: friend and husband updated at bedside   Consults, Procedures, Significant Events   Consultants:   Oncology  ID  GI (signed off)  Surgery (signed off)  Palliative care  Vascular surgery  Procedures:  right IJ Infuse-a-Port placed 9/17  Antimicrobials:  Anti-infectives (From admission, onward)   Start     Dose/Rate Route Frequency Ordered Stop   10/05/19 1300  ceFAZolin (ANCEF) IVPB 2g/100 mL premix        2 g 200 mL/hr over 30 Minutes Intravenous  Once 10/05/19 1253 10/05/19 1445   10/02/19 0200  vancomycin (VANCOCIN) IVPB 1000 mg/200 mL premix  Status:  Discontinued        1,000 mg 200 mL/hr over 60 Minutes Intravenous Every 24 hours 10/01/19 0316 10/01/19 1208   10/01/19 1000  ceFEPIme (MAXIPIME) 2 g in sodium chloride 0.9 % 100 mL IVPB  Status:  Discontinued        2  g 200 mL/hr over 30 Minutes Intravenous Every 12 hours 09/30/19 2356 10/04/19 1709   10/01/19 0600  metroNIDAZOLE (FLAGYL) IVPB 500 mg  Status:  Discontinued        500 mg 100 mL/hr over 60 Minutes Intravenous Every 8 hours 09/30/19 2356 10/04/19 1709   10/01/19 0000  vancomycin (VANCOREADY) IVPB 1750 mg/350 mL        1,750 mg 175 mL/hr over 120 Minutes Intravenous  Once 09/30/19 2356 10/01/19 0426   09/30/19 2045  ceFEPIme (MAXIPIME) 2 g in sodium chloride 0.9 % 100 mL IVPB        2 g 200 mL/hr over 30 Minutes Intravenous  Once 09/30/19 2041 09/30/19 2157   09/30/19 2045  metroNIDAZOLE (FLAGYL) IVPB 500 mg        500 mg 100 mL/hr over 60 Minutes Intravenous  Once 09/30/19 2041 09/30/19 2257         Objective   Vitals:   10/05/19 2037 10/06/19 0313 10/06/19 0749 10/06/19 1143  BP: (!) 143/75 (!) 157/70 (!) 148/55 (!) 156/74  Pulse: 82 81 85 99  Resp: '18 18 18 18  ' Temp: 98.5 F (36.9 C) (!) 97.3 F (36.3 C) 97.7 F (36.5 C) 97.6 F (36.4 C)  TempSrc: Oral Oral Oral Oral  SpO2: 95% 92% 93% 94%  Weight:      Height:        Intake/Output Summary (Last 24 hours) at  10/06/2019 1202 Last data filed at 10/06/2019 1143 Gross per 24 hour  Intake 1573.55 ml  Output 675 ml  Net 898.55 ml   Filed Weights   10/03/19 0544 10/04/19 0546 10/05/19 1339  Weight: 90.9 kg 99.2 kg 99.2 kg    Physical Exam:  General exam: sleepy but arousable, no acute distress Respiratory system: CTAB, normal respiratory effort. Cardiovascular system: normal S1/S2, RRR, chronic lymphedema.   Gastrointestinal system: soft, protuberant but not distended, +bowel sounds. Central nervous system: A&O x4. no gross focal neurologic deficits, normal speech Extremities: moves all, lyphedema, normal tone Psychiatry:not assessed  Labs   Data Reviewed: I have personally reviewed following labs and imaging studies  CBC: Recent Labs  Lab 09/30/19 1947 10/01/19 0602 10/02/19 0452 10/05/19 0705  WBC 13.2* 20.8* 10.0 15.7*  16.5*  NEUTROABS 12.5*  --   --  11.6*  HGB 14.0 12.1 11.0* 13.6  13.1  HCT 42.0 35.2* 31.7* 40.7  38.4  MCV 87.1 85.6 86.4 87.2  84.4  PLT 135* 111* 102* 110*  022*   Basic Metabolic Panel: Recent Labs  Lab 09/30/19 1947 10/01/19 0602 10/02/19 0452 10/03/19 0406 10/04/19 0605 10/05/19 0705 10/06/19 0747  NA 141   < > 143 146* 142 142 141  K 3.8   < > 3.2* 3.9 3.6 4.1 3.9  CL 104   < > 114* 114* 113* 114* 112*  CO2 21*   < > 20* 22 18* 16* 16*  GLUCOSE 113*   < > 82 119* 103* 88 139*  BUN 17   < > 24* 25* 24* 23 25*  CREATININE 1.54*   < > 1.31* 1.57* 1.39* 1.26* 1.13*  CALCIUM 10.2   < > 8.3* 8.7* 8.8* 9.0 8.8*  MG 1.9  --   --   --   --   --   --   PHOS 2.3*  --   --   --   --   --   --    < > = values in this interval not  displayed.   GFR: Estimated Creatinine Clearance: 52 mL/min (A) (by C-G formula based on SCr of 1.13 mg/dL (H)). Liver Function Tests: Recent Labs  Lab 10/02/19 0452 10/03/19 0406 10/04/19 0605 10/05/19 0705 10/06/19 0747  AST 378* 302* 287* 311* 333*  ALT 201* 179* 172* 171* 153*  ALKPHOS 522* 579* 681* 754*  939*  BILITOT 1.8* 2.1* 3.1* 3.3* 3.8*  PROT 5.0* 5.0* 5.2* 5.2* 5.4*  ALBUMIN 2.1* 2.1* 2.2* 2.1* 2.3*   Recent Labs  Lab 09/30/19 1947  LIPASE 73*   No results for input(s): AMMONIA in the last 168 hours. Coagulation Profile: Recent Labs  Lab 10/01/19 0602  INR 1.1   Cardiac Enzymes: No results for input(s): CKTOTAL, CKMB, CKMBINDEX, TROPONINI in the last 168 hours. BNP (last 3 results) No results for input(s): PROBNP in the last 8760 hours. HbA1C: No results for input(s): HGBA1C in the last 72 hours. CBG: Recent Labs  Lab 10/01/19 0723 10/01/19 1135 10/01/19 1557  GLUCAP 118* 130* 190*   Lipid Profile: No results for input(s): CHOL, HDL, LDLCALC, TRIG, CHOLHDL, LDLDIRECT in the last 72 hours. Thyroid Function Tests: No results for input(s): TSH, T4TOTAL, FREET4, T3FREE, THYROIDAB in the last 72 hours. Anemia Panel: No results for input(s): VITAMINB12, FOLATE, FERRITIN, TIBC, IRON, RETICCTPCT in the last 72 hours. Sepsis Labs: Recent Labs  Lab 09/30/19 1947 09/30/19 2144 10/01/19 0038 10/01/19 0602 10/03/19 0406  PROCALCITON  --   --   --  7.07 2.36  LATICACIDVEN 5.9* 4.4* 3.1*  --   --     Recent Results (from the past 240 hour(s))  SARS Coronavirus 2 by RT PCR (hospital order, performed in [facility] hospital lab) Nasopharyngeal Nasopharyngeal Swab     Status: None   Collection Time: 09/28/19 10:51 AM   Specimen: Nasopharyngeal Swab  Result Value Ref Range Status   SARS Coronavirus 2 NEGATIVE NEGATIVE Final    Comment: (NOTE) SARS-CoV-2 target nucleic acids are NOT DETECTED.  The SARS-CoV-2 RNA is generally detectable in upper and lower respiratory specimens during the acute phase of infection. The lowest concentration of SARS-CoV-2 viral copies this assay can detect is 250 copies / mL. A negative result does not preclude SARS-CoV-2 infection and should not be used as the sole basis for treatment or other patient management decisions.  A negative  result may occur with improper specimen collection / handling, submission of specimen other than nasopharyngeal swab, presence of viral mutation(s) within the areas targeted by this assay, and inadequate number of viral copies (<250 copies / mL). A negative result must be combined with clinical observations, patient history, and epidemiological information.  Fact Sheet for Patients:   StrictlyIdeas.no  Fact Sheet for Healthcare Providers: BankingDealers.co.za  This test is not yet approved or  cleared by the [place] FDA and has been authorized for detection and/or diagnosis of SARS-CoV-2 by FDA under an Emergency Use Authorization (EUA).  This EUA will remain in effect (meaning this test can be used) for the duration of the COVID-19 declaration under Section 564(b)(1) of the Act, 21 U.S.C. section 360bbb-3(b)(1), unless the authorization is terminated or revoked sooner.  Performed at [facility], [address] [place], [place] [address]   Blood culture (single)     Status: None   Collection Time: 09/30/19  7:47 PM   Specimen: BLOOD  Result Value Ref Range Status   Specimen Description BLOOD RIGHT [facility]  Final   Special Requests   Final    BOTTLES DRAWN  AEROBIC AND ANAEROBIC Blood Culture results may not be optimal due to an excessive volume of blood received in culture bottles   Culture   Final    NO GROWTH 5 DAYS Performed at [facility], [address] [place], [place] [address]    Report Status 10/05/2019 FINAL  Final  Culture, blood (single)     Status: None   Collection Time: 09/30/19  9:44 PM   Specimen: BLOOD  Result Value Ref Range Status   Specimen Description BLOOD RIGHT [facility]  Final   Special Requests   Final    BOTTLES DRAWN AEROBIC AND ANAEROBIC Blood Culture adequate volume   Culture   Final    NO GROWTH 5 DAYS Performed at [facility], [address], [place], [place]  [address]    Report Status 10/05/2019 FINAL  Final  SARS Coronavirus 2 by RT PCR (hospital order, performed in [facility] hospital lab) Nasopharyngeal Nasopharyngeal Swab     Status: None   Collection Time: 10/01/19 12:38 AM   Specimen: Nasopharyngeal Swab  Result Value Ref Range Status   SARS Coronavirus 2 NEGATIVE NEGATIVE Final    Comment: (NOTE) SARS-CoV-2 target nucleic acids are NOT DETECTED.  The SARS-CoV-2 RNA is generally detectable in upper and lower respiratory specimens during the acute phase of infection. The lowest concentration of SARS-CoV-2 viral copies this assay can detect is 250 copies / mL. A negative result does not preclude SARS-CoV-2 infection and should not be used as the sole basis for treatment or other patient management decisions.  A negative result may occur with improper specimen collection / handling, submission of specimen other than nasopharyngeal swab, presence of viral mutation(s) within the areas targeted by this assay, and inadequate number of viral copies (<250 copies / mL). A negative result must be combined with clinical observations, patient history, and epidemiological information.  Fact Sheet for Patients:   StrictlyIdeas.no  Fact Sheet for Healthcare Providers: BankingDealers.co.za  This test is not yet approved or  cleared by the [place] FDA and has been authorized for detection and/or diagnosis of SARS-CoV-2 by FDA under an Emergency Use Authorization (EUA).  This EUA will remain in effect (meaning this test can be used) for the duration of the COVID-19 declaration under Section 564(b)(1) of the Act, 21 U.S.C. section 360bbb-3(b)(1), unless the authorization is terminated or revoked sooner.  Performed at [facility], [address] [place], [place] [address]       Imaging Studies   PERIPHERAL VASCULAR CATHETERIZATION  Result Date: 10/05/2019 See op  note    Medications   Scheduled Meds: . vitamin C  1,000 mg Oral Daily  . Chlorhexidine Gluconate Cloth  6 each Topical Daily  . cholecalciferol  5,000 Units Oral BID  . enoxaparin (LOVENOX) injection  40 mg Subcutaneous Q24H  . predniSONE  15 mg Oral Daily   Continuous Infusions: . sodium chloride 75 mL/hr at 10/06/19 0801       LOS: 6 days    Time spent: 30 minutes    Desma Maxim, MD [facility]  10/06/2019, 12:02 PM    If 7PM-7AM, please contact night-coverage. How to contact the [facility] Attending or Consulting provider [place] or covering provider during after hours [place], for this patient?    1. Check the care team in [facility] and look for a) attending/consulting [facility] provider listed and b) the [facility] team listed 2. Log into [email or web address] and use [place]'s universal password to  access. If you do not have the password, please contact the hospital operator. 3. Locate the [facility] provider you are looking for under [facility] and page to a number that you can be directly reached. 4. If you still have difficulty reaching the provider, please page the [facility] (Director on Call) for the Hospitalists listed on amion for assistance.

## 2019-10-06 NOTE — Progress Notes (Signed)
Chest port on R chest placed yesterday.  This am during bedside report, noted dressing was saturated with blood. Dressing changed by night RN.  This RN then noted that patient has good blood return from the port but ecchymotic and swelling noted around port. Dr. Si Raider made aware and vascular will be in to see patient.  Patient states no discomfort at the site.

## 2019-10-07 LAB — COMPREHENSIVE METABOLIC PANEL
ALT: 164 U/L — ABNORMAL HIGH (ref 0–44)
AST: 434 U/L — ABNORMAL HIGH (ref 15–41)
Albumin: 2.1 g/dL — ABNORMAL LOW (ref 3.5–5.0)
Alkaline Phosphatase: 980 U/L — ABNORMAL HIGH (ref 38–126)
Anion gap: 9 (ref 5–15)
BUN: 28 mg/dL — ABNORMAL HIGH (ref 8–23)
CO2: 21 mmol/L — ABNORMAL LOW (ref 22–32)
Calcium: 8.6 mg/dL — ABNORMAL LOW (ref 8.9–10.3)
Chloride: 110 mmol/L (ref 98–111)
Creatinine, Ser: 0.96 mg/dL (ref 0.44–1.00)
GFR calc Af Amer: >60 mL/min (ref >60)
GFR calc non Af Amer: 58 mL/min — ABNORMAL LOW (ref >60)
Glucose, Bld: 147 mg/dL — ABNORMAL HIGH (ref 70–99)
Potassium: 3.9 mmol/L (ref 3.5–5.1)
Sodium: 140 mmol/L (ref 135–145)
Total Bilirubin: 3.4 mg/dL — ABNORMAL HIGH (ref 0.3–1.2)
Total Protein: 5.2 g/dL — ABNORMAL LOW (ref 6.5–8.1)

## 2019-10-07 MED ORDER — SODIUM CHLORIDE 0.9% FLUSH
10.0000 mL | Freq: Two times a day (BID) | INTRAVENOUS | Status: DC
  Administered 2019-10-07 – 2019-10-10 (×7): 10 mL

## 2019-10-07 MED ORDER — SODIUM CHLORIDE 0.9% FLUSH: 10.0000 mL | INTRAVENOUS | Status: DC | PRN

## 2019-10-07 NOTE — Progress Notes (Signed)
PROGRESS NOTE    Faith [name] [facility]   OZH:[id number]  DOB: 1945/12/17  PCP: Leonel Ramsay, MD    DOA: 09/30/2019 LOS: 7   Brief Narrative   Faith [name] a59 y.o.femalewith a known history of coronary artery disease, type diabetes mellitus, hypertension, dyslipidemia, [name] She stated that she felt fine when she went home yesterday till this afternoon when she felt weak and dizzy and could not stand or walk. She admitted to associated abdominal pain epigastric and right upper quadrant area. She denied any nausea or vomiting or diarrhea. No fever or chills. No chest pain or dyspnea or cough or wheezing. No dysuria, oliguria, urgency or frequency or flank pain.  Upon presentation to the emergency room, hypertension was 89.68F/31.7C,blood pressure 72/44 with a heart rate of 96 respiratory to 24 and pulse 70 was 98% on room air.With clear bolus of IV normal saline her blood pressure was up to 130/48. Labs were remarkable for anion gap of 16, magnesium of 1.9 with a potassium of 3.8 BUN of 17 with a creatinine of 1.54 compared to 21 and 0.97 on 9/11, high-sensitivity troponin I 160 and later 111, AST 358 ALT of 218 and alk phos of 900 with total bili of 2.2 albumin of 3 and total protein of 6.5.The patient's alk phos was 669 on 9/10 with total bili of 2.3, AST is 225 and ALT 177. Lactic acid came back 5.9 and later down to 4.4 from 1.8 on 9/10. CBC showed leukocytosis of 13.2 with neutrophilia as well as thrombocytopenia of 135. WBCs were 10 on 9/11. At 500 mL D-dimer was more than 7500, Tylenol level 10 and salicylate less than 7. TSH was 4.2 and free T4 1.37 both slightly elevated. Blood cultures were  drawn. EKG showed sinus rhythm with rate of 98 with occasional PVCs and possible left atrial enlargement. Right upper quadrant ultrasound revealed the following: 1. Cholelithiasis without definite sonographic evidence for acute cholecystitis. 2. Mildly thickened gallbladder wall which is nonspecific but can be seen in patients with underlying hepatocellular disease. 3. Innumerable hepatic masses are again noted, consistent with metastatic disease.  The patient was given IV cefepime and Flagyl, IV Zofran and 3 L bolus of IV normal saline. She will be admitted to a progressive unit bed for further evaluation and management.   Assessment & Plan   Active Problems:   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 female, estrogen receptor positive (Faith [name])   Elevated LFTs   Bone metastasis (HCC)   Liver metastasis (HCC)   HLD (hyperlipidemia)   Acute renal failure superimposed on stage 3a chronic kidney disease (HCC)   Sepsis (HCC)   Leukocytosis   Palliative care encounter   1. SIRS.  sepsis ruled out. No clear source of infection..  She did present with hypotension, elevated lactate, hypothermia and tachypnea.  No evidence of pneumonia on chest imaging.  Treated with IV abx 9/13-9/16. Cultures negative.   Overall hemodynamics have improved with IV fluids. Appreciate ID recs, noticed was not continued hom home prednisone, have restarted that. AM cortisol wnl and hemodynamics have improved, will hold on suggested acth stim test for now.  Initial plans were for paracentesis to rule out SBP, but she does not have significant ascites to  warrant paracentesis. MRCP w/o signs infection. Now providing supportive care. 2. Elevated liver enzymes.  Initially, based on imaging there was concern for cholecystitis.  Seen by general surgery high-dose felt unlikely that patient had cholecystitis and they have signed off.  She is also being seen by GI and it is thought that her elevated liver enzymes likely  related to underlying cancer. MRCP w/o signs obstruction. 3. Discharge planning: care mgmt says does not qualify for skilled nursing given ongoing chemo. Plan will have to be home with home health. Care mgmt continues to explore this and other options 4. Acute kidney injury on chronic kidney disease stage III.  Likely related to hypotension.  resolved. Off IV fluids and kidney function actually normal today... 5. Left breast cancer with metastases to liver and bone.  Oncology consulted. Port placed 9/17, weekly chemo began 9/17 6. Swelling at port-a-cath site: vascular surgery evaluated, is a stable hematoma, will continue to monitor. Is stable. 7. CAD.  Holding statin in light of elevated liver enzymes. Resuming home metoprolol 8. Leukocytosis.  Possibly reactive to #1.  She also received a dose of intravenous steroids while she was hypotensive.  She is currently afebrile.  Continue to monitor. 9. Hx GCA - resumed home prednisone 10. History of hypertension. Have resumed home metoprolol    Obesity: Body mass index is 35.49 kg/m.  Complicates overall care and prognosis.  DVT prophylaxis: enoxaparin (LOVENOX) injection 40 mg Start: 10/01/19 0000   Diet:  Diet Orders (From admission, onward)    Start     Ordered   10/05/19 1537  Diet regular Room service appropriate? Yes; Fluid consistency: Thin  Diet effective now       Question Answer Comment  Room service appropriate? Yes   Fluid consistency: Thin      10/05/19 1536            Code Status: DNR    Subjective 10/07/19    Patient seen at bedside this PM.  Somnolent but arousable. No pain. Says feels exhausted but no new problems. Tolerated a small amount of soup. No fevers.   Disposition Plan & Communication   Status is: Inpatient  Remains inpatient appropriate because:working on placement   Dispo: The patient is from: Home              Anticipated d/c is to: home with home health vs SNF if that can be arranged               Anticipated d/c date is: 1-2 days              Patient currently is not safe to discharge home.        Family Communication: friend updated at bedside   Consults, Procedures, Significant Events   Consultants:   Oncology  ID  GI (signed off)  Surgery (signed off)  Palliative care  Vascular surgery  Procedures:  right IJ Infuse-a-Port placed 9/17  Antimicrobials:  Anti-infectives (From admission, onward)   Start     Dose/Rate Route Frequency Ordered Stop   10/05/19 1300  ceFAZolin (ANCEF) IVPB 2g/100 mL premix        2 g 200 mL/hr over 30 Minutes Intravenous  Once 10/05/19 1253 10/05/19 1445   10/02/19 0200  vancomycin (VANCOCIN) IVPB 1000 mg/200 mL premix  Status:  Discontinued        1,000 mg 200 mL/hr over 60 Minutes Intravenous Every 24 hours 10/01/19 0316 10/01/19 1208   10/01/19 1000  ceFEPIme (  MAXIPIME) 2 g in sodium chloride 0.9 % 100 mL IVPB  Status:  Discontinued        2 g 200 mL/hr over 30 Minutes Intravenous Every 12 hours 09/30/19 2356 10/04/19 1709   10/01/19 0600  metroNIDAZOLE (FLAGYL) IVPB 500 mg  Status:  Discontinued        500 mg 100 mL/hr over 60 Minutes Intravenous Every 8 hours 09/30/19 2356 10/04/19 1709   10/01/19 0000  vancomycin (VANCOREADY) IVPB 1750 mg/350 mL        1,750 mg 175 mL/hr over 120 Minutes Intravenous  Once 09/30/19 2356 10/01/19 0426   09/30/19 2045  ceFEPIme (MAXIPIME) 2 g in sodium chloride 0.9 % 100 mL IVPB        2 g 200 mL/hr over 30 Minutes Intravenous  Once 09/30/19 2041 09/30/19 2157   09/30/19 2045  metroNIDAZOLE (FLAGYL) IVPB 500 mg        500 mg 100 mL/hr over 60 Minutes Intravenous  Once 09/30/19 2041 09/30/19 2257         Objective   Vitals:   10/06/19 2100 10/07/19 0434 10/07/19 0753 10/07/19 1115  BP: (!) 151/65 (!) 148/69 (!) 140/59 (!) 144/61  Pulse: 73 71 67 65  Resp:   19 18  Temp: 97.8 F (36.6 C) 97.9 F (36.6 C) 97.7 F (36.5 C) 97.7 F (36.5 C)  TempSrc: Oral Oral Oral   SpO2: 95%  92% 93% 95%  Weight:  102.8 kg    Height:        Intake/Output Summary (Last 24 hours) at 10/07/2019 1426 Last data filed at 10/07/2019 1114 Gross per 24 hour  Intake --  Output 1500 ml  Net -1500 ml   Filed Weights   10/04/19 0546 10/05/19 1339 10/07/19 0434  Weight: 99.2 kg 99.2 kg 102.8 kg    Physical Exam:  General exam: sleepy but arousable, no acute distress. Chronically ill appearing Respiratory system: CTAB, normal respiratory effort. Cardiovascular system: normal S1/S2, RRR, chronic lymphedema.   Gastrointestinal system: soft, protuberant but not distended, +bowel sounds. Central nervous system: A&O x4. no gross focal neurologic deficits, normal speech Extremities: moves all, 1+ edema LEs Psychiatry:depressed mood  Labs   Data Reviewed: I have personally reviewed following labs and imaging studies  CBC: Recent Labs  Lab 09/30/19 1947 10/01/19 0602 10/02/19 0452 10/05/19 0705  WBC 13.2* 20.8* 10.0 15.7*  16.5*  NEUTROABS 12.5*  --   --  11.6*  HGB 14.0 12.1 11.0* 13.6  13.1  HCT 42.0 35.2* 31.7* 40.7  38.4  MCV 87.1 85.6 86.4 87.2  84.4  PLT 135* 111* 102* 110*  161*   Basic Metabolic Panel: Recent Labs  Lab 09/30/19 1947 10/01/19 0602 10/03/19 0406 10/04/19 0605 10/05/19 0705 10/06/19 0747 10/07/19 0409  NA 141   < > 146* 142 142 141 140  K 3.8   < > 3.9 3.6 4.1 3.9 3.9  CL 104   < > 114* 113* 114* 112* 110  CO2 21*   < > 22 18* 16* 16* 21*  GLUCOSE 113*   < > 119* 103* 88 139* 147*  BUN 17   < > 25* 24* 23 25* 28*  CREATININE 1.54*   < > 1.57* 1.39* 1.26* 1.13* 0.96  CALCIUM 10.2   < > 8.7* 8.8* 9.0 8.8* 8.6*  MG 1.9  --   --   --   --   --   --   PHOS 2.3*  --   --   --   --   --   --    < > =  values in this interval not displayed.   GFR: Estimated Creatinine Clearance: 62.4 mL/min (by C-G formula based on SCr of 0.96 mg/dL). Liver Function Tests: Recent Labs  Lab 10/03/19 0406 10/04/19 0605 10/05/19 0705 10/06/19 0747  10/07/19 0409  AST 302* 287* 311* 333* 434*  ALT 179* 172* 171* 153* 164*  ALKPHOS 579* 681* 754* 939* 980*  BILITOT 2.1* 3.1* 3.3* 3.8* 3.4*  PROT 5.0* 5.2* 5.2* 5.4* 5.2*  ALBUMIN 2.1* 2.2* 2.1* 2.3* 2.1*   Recent Labs  Lab 09/30/19 1947  LIPASE 73*   No results for input(s): AMMONIA in the last 168 hours. Coagulation Profile: Recent Labs  Lab 10/01/19 0602  INR 1.1   Cardiac Enzymes: No results for input(s): CKTOTAL, CKMB, CKMBINDEX, TROPONINI in the last 168 hours. BNP (last 3 results) No results for input(s): PROBNP in the last 8760 hours. HbA1C: No results for input(s): HGBA1C in the last 72 hours. CBG: Recent Labs  Lab 10/01/19 0723 10/01/19 1135 10/01/19 1557  GLUCAP 118* 130* 190*   Lipid Profile: No results for input(s): CHOL, HDL, LDLCALC, TRIG, CHOLHDL, LDLDIRECT in the last 72 hours. Thyroid Function Tests: No results for input(s): TSH, T4TOTAL, FREET4, T3FREE, THYROIDAB in the last 72 hours. Anemia Panel: No results for input(s): VITAMINB12, FOLATE, FERRITIN, TIBC, IRON, RETICCTPCT in the last 72 hours. Sepsis Labs: Recent Labs  Lab 09/30/19 1947 09/30/19 2144 10/01/19 0038 10/01/19 0602 10/03/19 0406  PROCALCITON  --   --   --  7.07 2.36  LATICACIDVEN 5.9* 4.4* 3.1*  --   --     Recent Results (from the past 240 hour(s))  SARS Coronavirus 2 by RT PCR (hospital order, performed in [facility] hospital lab) Nasopharyngeal Nasopharyngeal Swab     Status: None   Collection Time: 09/28/19 10:51 AM   Specimen: Nasopharyngeal Swab  Result Value Ref Range Status   SARS Coronavirus 2 NEGATIVE NEGATIVE Final    Comment: (NOTE) SARS-CoV-2 target nucleic acids are NOT DETECTED.  The SARS-CoV-2 RNA is generally detectable in upper and lower respiratory specimens during the acute phase of infection. The lowest concentration of SARS-CoV-2 viral copies this assay can detect is 250 copies / mL. A negative result does not preclude SARS-CoV-2  infection and should not be used as the sole basis for treatment or other patient management decisions.  A negative result may occur with improper specimen collection / handling, submission of specimen other than nasopharyngeal swab, presence of viral mutation(s) within the areas targeted by this assay, and inadequate number of viral copies (<250 copies / mL). A negative result must be combined with clinical observations, patient history, and epidemiological information.  Fact Sheet for Patients:   StrictlyIdeas.no  Fact Sheet for Healthcare Providers: BankingDealers.co.za  This test is not yet approved or  cleared by the [place] FDA and has been authorized for detection and/or diagnosis of SARS-CoV-2 by FDA under an Emergency Use Authorization (EUA).  This EUA will remain in effect (meaning this test can be used) for the duration of the COVID-19 declaration under Section 564(b)(1) of the Act, 21 U.S.C. section 360bbb-3(b)(1), unless the authorization is terminated or revoked sooner.  Performed at [facility], [address] [place], [place] [address]   Blood culture (single)     Status: None   Collection Time: 09/30/19  7:47 PM   Specimen: BLOOD  Result Value Ref Range Status   Specimen Description BLOOD RIGHT [facility]  Final   Special Requests   Final  BOTTLES DRAWN AEROBIC AND ANAEROBIC Blood Culture results may not be optimal due to an excessive volume of blood received in culture bottles   Culture   Final    NO GROWTH 5 DAYS Performed at [facility], [address] [place], [place] [address]    Report Status 10/05/2019 FINAL  Final  Culture, blood (single)     Status: None   Collection Time: 09/30/19  9:44 PM   Specimen: BLOOD  Result Value Ref Range Status   Specimen Description BLOOD RIGHT [facility]  Final   Special Requests   Final    BOTTLES DRAWN AEROBIC AND ANAEROBIC Blood Culture adequate volume    Culture   Final    NO GROWTH 5 DAYS Performed at [facility], [address], [place], [place] [address]    Report Status 10/05/2019 FINAL  Final  SARS Coronavirus 2 by RT PCR (hospital order, performed in [facility] hospital lab) Nasopharyngeal Nasopharyngeal Swab     Status: None   Collection Time: 10/01/19 12:38 AM   Specimen: Nasopharyngeal Swab  Result Value Ref Range Status   SARS Coronavirus 2 NEGATIVE NEGATIVE Final    Comment: (NOTE) SARS-CoV-2 target nucleic acids are NOT DETECTED.  The SARS-CoV-2 RNA is generally detectable in upper and lower respiratory specimens during the acute phase of infection. The lowest concentration of SARS-CoV-2 viral copies this assay can detect is 250 copies / mL. A negative result does not preclude SARS-CoV-2 infection and should not be used as the sole basis for treatment or other patient management decisions.  A negative result may occur with improper specimen collection / handling, submission of specimen other than nasopharyngeal swab, presence of viral mutation(s) within the areas targeted by this assay, and inadequate number of viral copies (<250 copies / mL). A negative result must be combined with clinical observations, patient history, and epidemiological information.  Fact Sheet for Patients:   StrictlyIdeas.no  Fact Sheet for Healthcare Providers: BankingDealers.co.za  This test is not yet approved or  cleared by the [place] FDA and has been authorized for detection and/or diagnosis of SARS-CoV-2 by FDA under an Emergency Use Authorization (EUA).  This EUA will remain in effect (meaning this test can be used) for the duration of the COVID-19 declaration under Section 564(b)(1) of the Act, 21 U.S.C. section 360bbb-3(b)(1), unless the authorization is terminated or revoked sooner.  Performed at [facility], [address] [place], [place] [address]        Imaging Studies   PERIPHERAL VASCULAR CATHETERIZATION  Result Date: 10/05/2019 See op note    Medications   Scheduled Meds: . vitamin C  1,000 mg Oral Daily  . Chlorhexidine Gluconate Cloth  6 each Topical Daily  . cholecalciferol  5,000 Units Oral BID  . enoxaparin (LOVENOX) injection  40 mg Subcutaneous Q24H  . metoprolol tartrate  25 mg Oral BID  . predniSONE  15 mg Oral Daily  . sodium chloride flush  10-40 mL Intracatheter Q12H   Continuous Infusions:      LOS: 7 days    Time spent: 30 minutes    Desma Maxim, MD [facility]  10/07/2019, 2:26 PM    If 7PM-7AM, please contact night-coverage. How to contact the [facility] Attending or Consulting provider [place] or covering provider during after hours [place], for this patient?    1. Check the care team in [facility] and look for a) attending/consulting [facility] provider listed and b) the [facility] team  listed 2. Log into [email or web address] and use [place]'s universal password to access. If you do not have the password, please contact the hospital operator. 3. Locate the [facility] provider you are looking for under [facility] and page to a number that you can be directly reached. 4. If you still have difficulty reaching the provider, please page the [facility] (Director on Call) for the Hospitalists listed on amion for assistance.

## 2019-10-07 NOTE — Progress Notes (Signed)
Physical Therapy Treatment Patient Details Name: Faith [name] MRN: [id number] DOB: Apr 12, 1945 Today's Date: 10/07/2019    History of Present Illness 74 y.o. female with a known history of coronary artery disease, type diabetes mellitus, hypertension, dyslipidemia, presented to the emergency room with acute onset of generalized weakness and lightheadedness as well as abdominal pain.  He was admitted here on 9/10 and discharged yesterday when she was managed for hypotension and presyncope with acute kidney injury on stage IIIa chronic kidney disease.  She stated that she felt fine when she went home yesterday till this afternoon when she felt weak and dizzy and could not stand or walk.  She admitted to associated abdominal pain epigastric and right upper quadrant area.  She denied any nausea or vomiting or diarrhea.  No fever or chills.  No chest pain or dyspnea or cough or wheezing.  No dysuria, oliguria, urgency or frequency or flank pain    PT Comments    Patient agrees to PT treatment. She has eyes closed during most of session and doesn't verbalize much. She is alert and follow commands. She needs max assist for supine to sit bed mobility. She has difficulty with getting her midline static sitting balance and is leaning to the left and posterior initially. She is able to perform trunk side sit to sit x 10 on left and right with assist. She performs seated LAQ and marching with poor hip flex height x 10 BLE. She is able to sit at the edge of bed for 10 minutes before fatiguing and needing to rest. She needs mod assist for sit to supine bed mobility and then max assist to scoot up towards the head of the bed to reposition. She has no reports of pain. She will continue to benefit from skilled PT to improve mobility and strength.   Follow Up Recommendations  SNF     Equipment Recommendations  None recommended by PT    Recommendations for Other Services       Precautions / Restrictions  Precautions Precautions: Fall Restrictions Weight Bearing Restrictions: No    Mobility  Bed Mobility Overal bed mobility: Needs Assistance Bed Mobility: Supine to Sit;Sit to Supine Rolling: Mod assist   Supine to sit: Max assist Sit to supine: Mod assist   General bed mobility comments: Needed max assist to get trunk into sitting with posterior and right lateral lean  Transfers Overall transfer level:  (unable to clear her buttocks to stand)                  Ambulation/Gait                 Stairs             Wheelchair Mobility    Modified Rankin (Stroke Patients Only)       Balance Overall balance assessment: Needs assistance Sitting-balance support: Feet supported Sitting balance-Leahy Scale: Fair Sitting balance - Comments:  ( fair static sitting balance and poor dynamic sitting)                                    Cognition Arousal/Alertness: Awake/alert Behavior During Therapy: Flat affect Overall Cognitive Status: Within Functional Limits for tasks assessed  Exercises      General Comments        Pertinent Vitals/Pain Pain Assessment: No/denies pain    Home Living                      Prior Function            PT Goals (current goals can now be found in the care plan section) Acute Rehab PT Goals Patient Stated Goal: get strong enough to go home PT Goal Formulation: With patient Time For Goal Achievement: 10/18/19 Potential to Achieve Goals: Good    Frequency    Min 2X/week      PT Plan Current plan remains appropriate    Co-evaluation              AM-PAC PT "6 Clicks" Mobility   Outcome Measure  Help needed turning from your back to your side while in a flat bed without using bedrails?: A Lot Help needed moving from lying on your back to sitting on the side of a flat bed without using bedrails?: A Lot Help needed moving to  and from a bed to a chair (including a wheelchair)?: A Lot Help needed standing up from a chair using your arms (e.g., wheelchair or bedside chair)?: A Lot Help needed to walk in hospital room?: Total Help needed climbing 3-5 steps with a railing? : Total 6 Click Score: 10    End of Session   Activity Tolerance: Patient limited by fatigue;Patient limited by lethargy Patient left: in bed;with bed alarm set Nurse Communication: Mobility status PT Visit Diagnosis: Muscle weakness (generalized) (M62.81);Difficulty in walking, not elsewhere classified (R26.2)     Time: 2774-1287 PT Time Calculation (min) (ACUTE ONLY): 27 min  Charges:  $Therapeutic Activity: 23-37 mins                        Alanson Puls , PT DPT 10/07/2019, 12:31 PM

## 2019-10-07 NOTE — Progress Notes (Signed)
Mobility Specialist - Progress Note   10/07/19 1622  Mobility  Activity  (bed exercises)  Range of Motion/Exercises Right leg;Left leg (ankle pumps, quad sets, hip add/abd, straight leg raises)  Level of Assistance Moderate assist, patient does 50-74%  Assistive Device None  Distance Ambulated (ft) 0 ft  Mobility Response Tolerated well  Mobility performed by Mobility specialist  $Mobility charge 1 Mobility    Pre-mobility: 74 HR, 143/55 BP, 94% SpO2 Post-mobility: 66 HR, 148/56 BP, 94% SpO2   Pt was lying in bed upon arrival with best friend present in room. Pt agreed to session. Pt c/o fatigue being a "6/10" but denied nausea, dizziness, or pain. Pt was able to perform bed exercises: ankle pumps, straight leg raises, hip add/abd (10x/leg), and quad sets (5x/leg) with min-modA. The higher the reps for each exercise, the more the pt would become worn out, requiring more assistance. Overall, pt tolerated session well. Pt rated her RPE this session an "8/10". Pt remains in bed with all needs in reach. Nurse was notified of performance.    Kathee Delton Mobility Specialist 10/07/19, 4:30 PM

## 2019-10-08 ENCOUNTER — Encounter: Payer: Self-pay | Admitting: Vascular Surgery

## 2019-10-08 DIAGNOSIS — D696 Thrombocytopenia, unspecified: Secondary | ICD-10-CM

## 2019-10-08 LAB — COMPREHENSIVE METABOLIC PANEL
ALT: 167 U/L — ABNORMAL HIGH (ref 0–44)
AST: 458 U/L — ABNORMAL HIGH (ref 15–41)
Albumin: 2.1 g/dL — ABNORMAL LOW (ref 3.5–5.0)
Alkaline Phosphatase: 1021 U/L — ABNORMAL HIGH (ref 38–126)
Anion gap: 9 (ref 5–15)
BUN: 26 mg/dL — ABNORMAL HIGH (ref 8–23)
CO2: 21 mmol/L — ABNORMAL LOW (ref 22–32)
Calcium: 8.6 mg/dL — ABNORMAL LOW (ref 8.9–10.3)
Chloride: 110 mmol/L (ref 98–111)
Creatinine, Ser: 0.73 mg/dL (ref 0.44–1.00)
GFR calc Af Amer: >60 mL/min (ref >60)
GFR calc non Af Amer: >60 mL/min (ref >60)
Glucose, Bld: 115 mg/dL — ABNORMAL HIGH (ref 70–99)
Potassium: 3.9 mmol/L (ref 3.5–5.1)
Sodium: 140 mmol/L (ref 135–145)
Total Bilirubin: 4.3 mg/dL — ABNORMAL HIGH (ref 0.3–1.2)
Total Protein: 4.9 g/dL — ABNORMAL LOW (ref 6.5–8.1)

## 2019-10-08 LAB — CBC
HCT: 37.7 % (ref 36.0–46.0)
Hemoglobin: 12.3 g/dL (ref 12.0–15.0)
MCH: 29.4 pg (ref 26.0–34.0)
MCHC: 32.6 g/dL (ref 30.0–36.0)
MCV: 90.2 fL (ref 80.0–100.0)
Platelets: 83 10*3/uL — ABNORMAL LOW (ref 150–400)
RBC: 4.18 MIL/uL (ref 3.87–5.11)
RDW: 18.4 % — ABNORMAL HIGH (ref 11.5–15.5)
WBC: 11.4 10*3/uL — ABNORMAL HIGH (ref 4.0–10.5)
nRBC: 3.9 % — ABNORMAL HIGH (ref 0.0–0.2)

## 2019-10-08 NOTE — Progress Notes (Signed)
[facility] Hematology/Oncology Progress Note  Date of admission: 09/30/2019  Hospital day:  10/08/2019   Chief Complaint: Faith [name] is a 74 y.o. female with metastatic breast cancer who was admitted through the emergency room with hypotension, tachypnea, and hypothermia concerning for sepsis.  Subjective:  She denies any pain.  She has been sleepy today.  Oral intake is poor.  She has not been out of bed.    Social History: The patient is accompanied by her friend, Joslyn Devon, today  Allergies:  Allergies  Allergen Reactions  . Mushroom Extract Complex Hives and Swelling  . Nickel Rash  . Shellfish Allergy Nausea And Vomiting    Scheduled Medications: . vitamin C  1,000 mg Oral Daily  . Chlorhexidine Gluconate Cloth  6 each Topical Daily  . cholecalciferol  5,000 Units Oral BID  . enoxaparin (LOVENOX) injection  40 mg Subcutaneous Q24H  . metoprolol tartrate  25 mg Oral BID  . predniSONE  15 mg Oral Daily  . sodium chloride flush  10-40 mL Intracatheter Q12H    Review of Systems  Constitutional: Positive for malaise/fatigue. Negative for chills, diaphoresis and fever.       Sleepy today.  HENT: Negative.  Negative for congestion, ear pain, hearing loss, nosebleeds and sore throat.   Eyes: Negative.  Negative for blurred vision, double vision and photophobia.  Respiratory: Negative.  Negative for cough, hemoptysis, sputum production and shortness of breath.   Cardiovascular: Negative.  Negative for chest pain, palpitations, orthopnea and PND.  Gastrointestinal: Positive for abdominal pain (well controlled) and nausea (slight). Negative for blood in stool, constipation, diarrhea, heartburn, melena and vomiting.       Poor oral intake.  Genitourinary: Negative.  Negative for dysuria, frequency, hematuria and urgency.  Musculoskeletal: Negative.  Negative for back pain, myalgias and neck pain.  Skin: Negative.  Negative for itching and rash.  Neurological:  Positive for weakness (generalized). Negative for dizziness, tingling, tremors, sensory change, speech change, focal weakness and headaches.  Endo/Heme/Allergies: Negative.  Does not bruise/bleed easily.  Psychiatric/Behavioral: Negative.  Negative for memory loss. The patient is not nervous/anxious and does not have insomnia.    PERFORMANCE STATUS (ECOG):  3-4  Physical Exam Constitutional:      General: She is not in acute distress.    Appearance: She is not toxic-appearing or diaphoretic.     Comments: Fatigued woman lying in bed in no acute distress. Her friend is by her side.  HENT:     Head: Normocephalic and atraumatic.     Mouth/Throat:     Mouth: Mucous membranes are moist.  Eyes:     General: Scleral icterus present.     Extraocular Movements: Extraocular movements intact.     Pupils: Pupils are equal, round, and reactive to light.  Cardiovascular:     Rate and Rhythm: Normal rate and regular rhythm.     Heart sounds: No murmur heard.  No gallop.   Pulmonary:     Effort: Pulmonary effort is normal. No respiratory distress.     Breath sounds: Normal breath sounds. No wheezing or rales.     Comments: Decreased respiratory excursion. Chest:     Comments: Near midline port-a-cath with underlying small hematoma.  No erythema or tenderness. Abdominal:     General: Bowel sounds are normal. There is no distension.     Palpations: There is hepatomegaly. There is no splenomegaly.  Musculoskeletal:     Comments: 3+ edema of upper and lower  extremities.  Skin:    General: Skin is warm and dry.     Coloration: Skin is not jaundiced or pale.     Findings: No erythema or rash.  Neurological:     General: No focal deficit present.     Mental Status: She is alert and oriented to person, place, and time.     Cranial Nerves: No cranial nerve deficit.     Motor: No weakness.  Psychiatric:        Mood and Affect: Mood normal.        Behavior: Behavior normal.    Results for  orders placed or performed during the hospital encounter of 09/30/19 (from the past 48 hour(s))  Comprehensive metabolic panel     Status: Abnormal   Collection Time: 10/07/19  4:09 AM  Result Value Ref Range   Sodium 140 135 - 145 mmol/L   Potassium 3.9 3.5 - 5.1 mmol/L   Chloride 110 98 - 111 mmol/L   CO2 21 (L) 22 - 32 mmol/L   Glucose, Bld 147 (H) 70 - 99 mg/dL    Comment: Glucose reference range applies only to samples taken after fasting for at least 8 hours.   BUN 28 (H) 8 - 23 mg/dL   Creatinine, Ser 0.96 0.44 - 1.00 mg/dL   Calcium 8.6 (L) 8.9 - 10.3 mg/dL   Total Protein 5.2 (L) 6.5 - 8.1 g/dL   Albumin 2.1 (L) 3.5 - 5.0 g/dL   AST 434 (H) 15 - 41 U/L   ALT 164 (H) 0 - 44 U/L   Alkaline Phosphatase 980 (H) 38 - 126 U/L   Total Bilirubin 3.4 (H) 0.3 - 1.2 mg/dL   GFR calc non Af Amer 58 (L) >60 mL/min   GFR calc Af Amer >60 >60 mL/min   Anion gap 9 5 - 15    Comment: Performed at [facility], [address] [place], [place] [address]  Comprehensive metabolic panel     Status: Abnormal   Collection Time: 10/08/19  6:01 AM  Result Value Ref Range   Sodium 140 135 - 145 mmol/L   Potassium 3.9 3.5 - 5.1 mmol/L   Chloride 110 98 - 111 mmol/L   CO2 21 (L) 22 - 32 mmol/L   Glucose, Bld 115 (H) 70 - 99 mg/dL    Comment: Glucose reference range applies only to samples taken after fasting for at least 8 hours.   BUN 26 (H) 8 - 23 mg/dL   Creatinine, Ser 0.73 0.44 - 1.00 mg/dL   Calcium 8.6 (L) 8.9 - 10.3 mg/dL   Total Protein 4.9 (L) 6.5 - 8.1 g/dL   Albumin 2.1 (L) 3.5 - 5.0 g/dL   AST 458 (H) 15 - 41 U/L   ALT 167 (H) 0 - 44 U/L   Alkaline Phosphatase 1,021 (H) 38 - 126 U/L   Total Bilirubin 4.3 (H) 0.3 - 1.2 mg/dL   GFR calc non Af Amer >60 >60 mL/min   GFR calc Af Amer >60 >60 mL/min   Anion gap 9 5 - 15    Comment: Performed at [facility], [address] [place], [place] [address]  CBC     Status: Abnormal   Collection Time: 10/08/19  6:02 AM   Result Value Ref Range   WBC 11.4 (H) 4.0 - 10.5 K/uL   RBC 4.18 3.87 - 5.11 MIL/uL   Hemoglobin 12.3 12.0 - 15.0 g/dL   HCT 37.7 36 - 46 %  MCV 90.2 80.0 - 100.0 fL   MCH 29.4 26.0 - 34.0 pg   MCHC 32.6 30.0 - 36.0 g/dL   RDW 18.4 (H) 11.5 - 15.5 %   Platelets 83 (L) 150 - 400 K/uL    Comment: Immature Platelet Fraction may be clinically indicated, consider ordering this additional test [id number]    nRBC 3.9 (H) 0.0 - 0.2 %    Comment: Performed at [facility], [address] [place],  [address]   No results found.  Assessment:  MARELYN ROUSER is a 74 y.o. female with metastatic breast cancer who was admitted through the emergency room with weakness, lightheadedness and RUQ abdominal pain. Ultrasound guided liver biopsy on 09/18/2019 confirmed metastatic breast cancer (ER+, PR-, and Her2/neu) -.  Chest, abdomen, and pelvis CT angiogram on 09/30/2019 revealed increasingly conspicuous gallbladder wall thickening with some pericholecystic inflammation, nonspecific fluid-filled distal small bowel, innumerable liver lesions, and developing ascites (? secondary to liver dysfunction), small right adrenal nodule, bone metastasis, and nonspecific endometrial thickening.  Abdomen MRCP on 10/03/2019 revealed diffuse/extensive metastatic disease throughout both lobes of the liver.  There was gallbladder sludge and gallstones but no definite findings for acute cholecystitis. The common bile duct was normal caliber.  There was no abdominal lymphadenopathy, but diffuse sclerotic metastatic bone disease.  There were small bilateral pleural effusions and overlying atelectasis.  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  She is day 4 of week #1 Taxol (10/05/2019).  She has a history of hypercalcemia (11.7 corrected).  She was treated with Zometa on 09/26/2019.   Symptomatically, she is fatigued.  She has significant third spacing.  LFTs continue to rise.   Platelet count is 83,000.   Plan:   1.  Metastatic breast cancer  She is day 4 of week #1 Taxol.  She has tolerated her chemotherapy with minimal nausea.  Discuss interval LFTs and blood counts.  Discuss plan for continued weekly treatment if counts and LFTs are adequate on 10/12/2019.  Prognosis is poor. 2.  Thrombocytopenia             Platelet count 83,000.  Monitor CBC with diff every other day. 3.   Elevated LFTs  Etiology secondary to underlying liver metastasis.  10/05/2019: AST 311, ALT 171, bilirubin 3.3, alkaline phosphatase 754.  10/07/2019: AST 434, ALT 164, bilirubin 3.4, alkaline phosphatase 980.  10/08/2019: AST 458, ALT 167, bilirubin 4.3, alkaline phosphatase 1021.  Continue to monitor every other day. 4.   Disposition  Performance status is poor.  She is unable to care for herself.  Discuss issues regarding skilled nursing placement (? feasibility with weekly chemotherapy) as well as discharge home with home health.  Discuss with social work.  Appreciate palliative care assistance.    Lequita Asal, MD  10/08/2019, 5:30 PM

## 2019-10-08 NOTE — Progress Notes (Signed)
PROGRESS NOTE    Faith [name] [facility]   LPN:[id number]  DOB: 04/04/1945  PCP: Leonel Ramsay, MD    DOA: 09/30/2019 LOS: 8   Brief Narrative   Faith [name] a68 y.o.femalewith a known history of coronary artery disease, type diabetes mellitus, hypertension, dyslipidemia, [name] She stated that she felt fine when she went home yesterday till this afternoon when she felt weak and dizzy and could not stand or walk. She admitted to associated abdominal pain epigastric and right upper quadrant area. She denied any nausea or vomiting or diarrhea. No fever or chills. No chest pain or dyspnea or cough or wheezing. No dysuria, oliguria, urgency or frequency or flank pain.  Upon presentation to the emergency room, hypertension was 89.13F/31.7C,blood pressure 72/44 with a heart rate of 96 respiratory to 24 and pulse 70 was 98% on room air.With clear bolus of IV normal saline her blood pressure was up to 130/48. Labs were remarkable for anion gap of 16, magnesium of 1.9 with a potassium of 3.8 BUN of 17 with a creatinine of 1.54 compared to 21 and 0.97 on 9/11, high-sensitivity troponin I 160 and later 111, AST 358 ALT of 218 and alk phos of 900 with total bili of 2.2 albumin of 3 and total protein of 6.5.The patient's alk phos was 669 on 9/10 with total bili of 2.3, AST is 225 and ALT 177. Lactic acid came back 5.9 and later down to 4.4 from 1.8 on 9/10. CBC showed leukocytosis of 13.2 with neutrophilia as well as thrombocytopenia of 135. WBCs were 10 on 9/11. At 500 mL D-dimer was more than 7500, Tylenol level 10 and salicylate less than 7. TSH was 4.2 and free T4 1.37 both slightly elevated. Blood cultures were  drawn. EKG showed sinus rhythm with rate of 98 with occasional PVCs and possible left atrial enlargement. Right upper quadrant ultrasound revealed the following: 1. Cholelithiasis without definite sonographic evidence for acute cholecystitis. 2. Mildly thickened gallbladder wall which is nonspecific but can be seen in patients with underlying hepatocellular disease. 3. Innumerable hepatic masses are again noted, consistent with metastatic disease.  The patient was given IV cefepime and Flagyl, IV Zofran and 3 L bolus of IV normal saline. She will be admitted to a progressive unit bed for further evaluation and management.   Assessment & Plan   Active Problems:   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 female, estrogen receptor positive ([place])   Elevated LFTs   Bone metastasis (HCC)   Liver metastasis (HCC)   HLD (hyperlipidemia)   Acute renal failure superimposed on stage 3a chronic kidney disease (HCC)   Leukocytosis   Palliative care encounter   1. SIRS.  sepsis ruled out. No clear source of infection..  She did present with hypotension, elevated lactate, hypothermia and tachypnea.  No evidence of pneumonia on chest imaging.  Treated with IV abx 9/13-9/16. Cultures negative.   Overall hemodynamics have improved with IV fluids that have now been discontinued. Appreciate ID recs, noticed was not continued hom home prednisone, have restarted that. AM cortisol wnl and hemodynamics have improved, think less likely adrenal insufficiency.  Initial plans were for paracentesis to rule out SBP, but she does not have significant ascites to warrant paracentesis. MRCP  w/o signs infection. Now providing supportive care. 2. Elevated liver enzymes.  Initially, based on imaging there was concern for cholecystitis.  Seen by general surgery high-dose felt unlikely that patient had cholecystitis and they have signed off.  She is also being seen by GI and it is thought that her elevated liver  enzymes likely related to underlying cancer. MRCP w/o signs obstruction. Uptrending LFTs likely consequence of progressing cancer. 3. Discharge planning: care mgmt says does not qualify for skilled nursing given ongoing chemo. Plan will have to be home with home health. Care mgmt at work getting this set up. 4. Acute kidney injury. Likely related to hypotension. Resolved and off IV fluids 5. Left breast cancer with metastases to liver and bone.  Oncology consulted. Port placed 9/17, weekly chemo began 9/17 6. Hematoma at port-a-cath site - resolving 7. CAD.  Holding statin in light of elevated liver enzymes. Resumed home metoprolol 8. Leukocytosis.  Possibly reactive to #1.  She also received a dose of intravenous steroids while she was hypotensive.  She is currently afebrile.  Continue to monitor. 9. Hx GCA - resumed home prednisone 10. History of hypertension. Have resumed home metoprolol    Obesity: Body mass index is 35.6 kg/m.  Complicates overall care and prognosis.  DVT prophylaxis: enoxaparin (LOVENOX) injection 40 mg Start: 10/01/19 0000   Diet:  Diet Orders (From admission, onward)    Start     Ordered   10/05/19 1537  Diet regular Room service appropriate? Yes; Fluid consistency: Thin  Diet effective now       Question Answer Comment  Room service appropriate? Yes   Fluid consistency: Thin      10/05/19 1536            Code Status: DNR    Subjective 10/08/19    Patient seen at bedside this PM.  Continues to complain of fatigue and poor appetite. No vomiting or new pains.   Disposition Plan & Communication   Status is: Inpatient  Remains inpatient appropriate because:working on placement   Dispo: The patient is from: Home              Anticipated d/c is to: home with home health vs SNF if that can be arranged              Anticipated d/c date is: 1-2 days              Patient currently is not safe to discharge home.  Family Communication: friend updated at  bedside   Consults, Procedures, Significant Events   Consultants:   Oncology  ID  GI (signed off)  Surgery (signed off)  Palliative care  Vascular surgery  Procedures:  right IJ Infuse-a-Port placed 9/17  Antimicrobials:  Anti-infectives (From admission, onward)   Start     Dose/Rate Route Frequency Ordered Stop   10/05/19 1300  ceFAZolin (ANCEF) IVPB 2g/100 mL premix        2 g 200 mL/hr over 30 Minutes Intravenous  Once 10/05/19 1253 10/05/19 1445   10/02/19 0200  vancomycin (VANCOCIN) IVPB 1000 mg/200 mL premix  Status:  Discontinued        1,000 mg 200 mL/hr over 60 Minutes Intravenous Every 24 hours 10/01/19 0316 10/01/19 1208   10/01/19 1000  ceFEPIme (MAXIPIME) 2 g in sodium chloride 0.9 % 100 mL IVPB  Status:  Discontinued        2 g 200 mL/hr over 30 Minutes Intravenous Every 12 hours 09/30/19  2356 10/04/19 1709   10/01/19 0600  metroNIDAZOLE (FLAGYL) IVPB 500 mg  Status:  Discontinued        500 mg 100 mL/hr over 60 Minutes Intravenous Every 8 hours 09/30/19 2356 10/04/19 1709   10/01/19 0000  vancomycin (VANCOREADY) IVPB 1750 mg/350 mL        1,750 mg 175 mL/hr over 120 Minutes Intravenous  Once 09/30/19 2356 10/01/19 0426   09/30/19 2045  ceFEPIme (MAXIPIME) 2 g in sodium chloride 0.9 % 100 mL IVPB        2 g 200 mL/hr over 30 Minutes Intravenous  Once 09/30/19 2041 09/30/19 2157   09/30/19 2045  metroNIDAZOLE (FLAGYL) IVPB 500 mg        500 mg 100 mL/hr over 60 Minutes Intravenous  Once 09/30/19 2041 09/30/19 2257         Objective   Vitals:   10/08/19 0451 10/08/19 0636 10/08/19 0740 10/08/19 1118  BP: (!) 126/44  (!) 143/54 (!) 125/50  Pulse: 71  72 71  Resp: _0 Temp: 98.4 F (36.9 C)  97.7 F (36.5 C) 98.4 F (36.9 C)  TempSrc: Oral  Oral Oral  SpO2: 93%  95% 95%  Weight:  103.1 kg    Height:        Intake/Output Summary (Last 24 hours) at 10/08/2019 1250 Last data filed at 10/08/2019 1119 Gross per 24 hour  Intake --   Output 750 ml  Net -750 ml   Filed Weights   10/05/19 1339 10/07/19 0434 10/08/19 0636  Weight: 99.2 kg 102.8 kg 103.1 kg    Physical Exam:  General exam: sleepy but arousable, no acute distress. Chronically ill appearing Respiratory system: CTAB, normal respiratory effort. Cardiovascular system: normal S1/S2, RRR, chronic lymphedema.   Gastrointestinal system: soft, protuberant but not distended, +bowel sounds. Central nervous system: A&O x4. no gross focal neurologic deficits, normal speech Extremities: moves all, 1+ edema LEs Psychiatry:depressed mood  Labs   Data Reviewed: I have personally reviewed following labs and imaging studies  CBC: Recent Labs  Lab 10/02/19 0452 10/05/19 0705  WBC 10.0 15.7*  16.5*  NEUTROABS  --  11.6*  HGB 11.0* 13.6  13.1  HCT 31.7* 40.7  38.4  MCV 86.4 87.2  84.4  PLT 102* 110*  177*   Basic Metabolic Panel: Recent Labs  Lab 10/04/19 0605 10/05/19 0705 10/06/19 0747 10/07/19 0409 10/08/19 0601  NA 142 142 141 140 140  K 3.6 4.1 3.9 3.9 3.9  CL 113* 114* 112* 110 110  CO2 18* 16* 16* 21* 21*  GLUCOSE 103* 88 139* 147* 115*  BUN 24* 23 25* 28* 26*  CREATININE 1.39* 1.26* 1.13* 0.96 0.73  CALCIUM 8.8* 9.0 8.8* 8.6* 8.6*   GFR: Estimated Creatinine Clearance: 75 mL/min (by C-G formula based on SCr of 0.73 mg/dL). Liver Function Tests: Recent Labs  Lab 10/04/19 0605 10/05/19 0705 10/06/19 0747 10/07/19 0409 10/08/19 0601  AST 287* 311* 333* 434* 458*  ALT 172* 171* 153* 164* 167*  ALKPHOS 681* 754* 939* 980* 1,021*  BILITOT 3.1* 3.3* 3.8* 3.4* 4.3*  PROT 5.2* 5.2* 5.4* 5.2* 4.9*  ALBUMIN 2.2* 2.1* 2.3* 2.1* 2.1*   No results for input(s): LIPASE, AMYLASE in the last 168 hours. No results for input(s): AMMONIA in the last 168 hours. Coagulation Profile: No results for input(s): INR, PROTIME in the last 168 hours. Cardiac Enzymes: No results for input(s): CKTOTAL, CKMB, CKMBINDEX, TROPONINI in the last 168  hours. BNP (last 3 results) No results for input(s): PROBNP in the last 8760 hours. HbA1C: No results for input(s): HGBA1C in the last 72 hours. CBG: Recent Labs  Lab 10/01/19 1557  GLUCAP 190*   Lipid Profile: No results for input(s): CHOL, HDL, LDLCALC, TRIG, CHOLHDL, LDLDIRECT in the last 72 hours. Thyroid Function Tests: No results for input(s): TSH, T4TOTAL, FREET4, T3FREE, THYROIDAB in the last 72 hours. Anemia Panel: No results for input(s): VITAMINB12, FOLATE, FERRITIN, TIBC, IRON, RETICCTPCT in the last 72 hours. Sepsis Labs: Recent Labs  Lab 10/03/19 0406  PROCALCITON 2.36    Recent Results (from the past 240 hour(s))  Blood culture (single)     Status: None   Collection Time: 09/30/19  7:47 PM   Specimen: BLOOD  Result Value Ref Range Status   Specimen Description BLOOD RIGHT [facility]  Final   Special Requests   Final    BOTTLES DRAWN AEROBIC AND ANAEROBIC Blood Culture results may not be optimal due to an excessive volume of blood received in culture bottles   Culture   Final    NO GROWTH 5 DAYS Performed at [facility] [address], [place], [place] [address]    Report Status 10/05/2019 FINAL  Final  Culture, blood (single)     Status: None   Collection Time: 09/30/19  9:44 PM   Specimen: BLOOD  Result Value Ref Range Status   Specimen Description BLOOD RIGHT [facility]  Final   Special Requests   Final    BOTTLES DRAWN AEROBIC AND ANAEROBIC Blood Culture adequate volume   Culture   Final    NO GROWTH 5 DAYS Performed at [facility], [address], [place], [place] [address]    Report Status 10/05/2019 FINAL  Final  SARS Coronavirus 2 by RT PCR (hospital order, performed in [facility] hospital lab) Nasopharyngeal Nasopharyngeal Swab     Status: None   Collection Time: 10/01/19 12:38 AM   Specimen: Nasopharyngeal Swab  Result Value Ref Range Status   SARS Coronavirus 2 NEGATIVE NEGATIVE Final    Comment: (NOTE) SARS-CoV-2 target nucleic  acids are NOT DETECTED.  The SARS-CoV-2 RNA is generally detectable in upper and lower respiratory specimens during the acute phase of infection. The lowest concentration of SARS-CoV-2 viral copies this assay can detect is 250 copies / mL. A negative result does not preclude SARS-CoV-2 infection and should not be used as the sole basis for treatment or other patient management decisions.  A negative result may occur with improper specimen collection / handling, submission of specimen other than nasopharyngeal swab, presence of viral mutation(s) within the areas targeted by this assay, and inadequate number of viral copies (<250 copies / mL). A negative result must be combined with clinical observations, patient history, and epidemiological information.  Fact Sheet for Patients:   StrictlyIdeas.no  Fact Sheet for Healthcare Providers: BankingDealers.co.za  This test is not yet approved or  cleared by the [place] FDA and has been authorized for detection and/or diagnosis of SARS-CoV-2 by FDA under an Emergency Use Authorization (EUA).  This EUA will remain in effect (meaning this test can be used) for the duration of the COVID-19 declaration under Section 564(b)(1) of the Act, 21 U.S.C. section 360bbb-3(b)(1), unless the authorization is terminated or revoked sooner.  Performed at [facility], [address], [place],  [address]       Imaging Studies   No results found.   Medications   Scheduled Meds: .  vitamin C  1,000 mg Oral Daily  . Chlorhexidine Gluconate Cloth  6 each Topical Daily  . cholecalciferol  5,000 Units Oral BID  . enoxaparin (LOVENOX) injection  40 mg Subcutaneous Q24H  . metoprolol tartrate  25 mg Oral BID  . predniSONE  15 mg Oral Daily  . sodium chloride flush  10-40 mL Intracatheter Q12H   Continuous Infusions:      LOS: 8 days    Time spent: 30 minutes    Desma Maxim, MD [facility]  10/08/2019, 12:50 PM    If 7PM-7AM, please contact night-coverage. How to contact the [facility] Attending or Consulting provider [place] or covering provider during after hours [place], for this patient?    1. Check the care team in [facility] and look for a) attending/consulting [facility] provider listed and b) the [facility] team listed 2. Log into [email or web address] and use [facility]'s universal password to access. If you do not have the password, please contact the hospital operator. 3. Locate the [facility] provider you are looking for under [facility] and page to a number that you can be directly reached. 4. If you still have difficulty reaching the provider, please page the [facility] (Director on Call) for the Hospitalists listed on amion for assistance.

## 2019-10-08 NOTE — Care Management Important Message (Signed)
Important Message  Patient Details  Name: Faith [name] MRN: [id number] Date of Birth: [birth year]   Medicare Important Message Given:  Yes     Dannette Barbara 10/08/2019, 12:17 PM

## 2019-10-08 NOTE — Progress Notes (Signed)
Occupational Therapy Treatment Patient Details Name: Faith [name] MRN: [id number] DOB: Mar 13, 1945 Today's Date: 10/08/2019    History of present illness 74 y.o. female with a known history of coronary artery disease, type diabetes mellitus, hypertension, dyslipidemia, presented to the emergency room with acute onset of generalized weakness and lightheadedness as well as abdominal pain.  He was admitted here on 9/10 and discharged yesterday when she was managed for hypotension and presyncope with acute kidney injury on stage IIIa chronic kidney disease.  She stated that she felt fine when she went home yesterday till this afternoon when she felt weak and dizzy and could not stand or walk.  She admitted to associated abdominal pain epigastric and right upper quadrant area.  She denied any nausea or vomiting or diarrhea.  No fever or chills.  No chest pain or dyspnea or cough or wheezing.  No dysuria, oliguria, urgency or frequency or flank pain   OT comments  Upon entering the room, pt supine in bed with friend present in the room. Pt appears very lethargic and moans throughout session in pain but does not report where pain is located. Pt declined bed mobility and OT placing pt into chair position in bed. Session focus on washing and combing hair but pt very limited and requiring total A. B UEs noted to have increased fluid and pt reports pain with movement. Friend reported it being a great effort for her to reach for cup at lunch time. OT discussed fatigue as well and encouraged pt to attempt participation tomorrow with therapy in hopes of a "better day". Pt with flat affect throughout session as well. Pt agreeable to remain in chair position with friend present in room. All needs within reach.   Follow Up Recommendations  SNF;Supervision/Assistance - 24 hour    Equipment Recommendations  Other (comment) (defer to next venue of care)       Precautions / Restrictions Precautions Precautions: Fall               ADL either performed or assessed with clinical judgement   ADL Overall ADL's : Needs assistance/impaired        General ADL Comments: Pt very limited this session and fatigued with all efforts. Total A for all self care needs.               Cognition Arousal/Alertness: Awake/alert Behavior During Therapy: Flat affect Overall Cognitive Status: Within Functional Limits for tasks assessed                        Pertinent Vitals/ Pain       Pain Assessment: Faces Faces Pain Scale: Hurts little more Pain Location: generalized "all over" Pain Descriptors / Indicators: Discomfort;Guarding Pain Intervention(s): Limited activity within patient's tolerance         Frequency  Min 1X/week        Progress Toward Goals  OT Goals(current goals can now be found in the care plan section)  Progress towards OT goals: Not progressing toward goals - comment  Acute Rehab OT Goals Patient Stated Goal: go home OT Goal Formulation: With patient Time For Goal Achievement: 10/18/19 Potential to Achieve Goals: Good  Plan Discharge plan remains appropriate       AM-PAC OT "6 Clicks" Daily Activity     Outcome Measure   Help from another person eating meals?: Total Help from another person taking care of personal grooming?: Total Help from another person toileting, which includes  using toliet, bedpan, or urinal?: Total Help from another person bathing (including washing, rinsing, drying)?: Total Help from another person to put on and taking off regular upper body clothing?: Total Help from another person to put on and taking off regular lower body clothing?: Total 6 Click Score: 6    End of Session    OT Visit Diagnosis: Muscle weakness (generalized) (M62.81)   Activity Tolerance Patient limited by lethargy;Patient limited by pain   Patient Left in bed;with call bell/phone within reach;with bed alarm set;with family/visitor present   Nurse  Communication Mobility status        Time: 1522-1550 OT Time Calculation (min): 28 min  Charges: OT General Charges $OT Visit: 1 Visit OT Treatments $Self Care/Home Management : 23-37 mins  Darleen Crocker, MS, OTR/L , CBIS ascom [phone]  10/08/19, 4:20 PM

## 2019-10-08 NOTE — NC FL2 (Signed)
[place] LEVEL OF CARE SCREENING TOOL     IDENTIFICATION  Patient Name: Faith [name] Birthdate: Sep 08, 1945 Sex: female Admission Date (Current Location): 09/30/2019  [facility] and Faith Number:  Engineering geologist and Address:  [facility], [address], [place], [place] [address]      Provider Number: [id number]  Attending Physician Name and Address:  Gwynne Edinger, MD  Relative Name and Phone Number:  Tremeka Helbling  [phone]    Current Level of Care: Hospital Recommended Level of Care: [place] Prior Approval Number:    Date Approved/Denied:   PASRR Number: [id number]  Discharge Plan: SNF    Current Diagnoses: Patient Active Problem List   Diagnosis Date Noted   Palliative care encounter    Leukocytosis 10/01/2019   Elevated bilirubin 09/28/2019   Hypotension 09/28/2019   Near syncope 09/28/2019   Hypertension 09/28/2019   HLD (hyperlipidemia) 09/28/2019   Acute renal failure superimposed on stage 3a chronic kidney disease ([place]) 09/28/2019   Elevated troponin 09/28/2019   CAD (coronary artery disease) 09/28/2019   Liver metastasis ([place]) 09/25/2019   Bone metastasis ([place]) 09/09/2019   Elevated LFTs 09/06/2019   Elevated ferritin 09/06/2019   Liver masses 09/06/2019   Weight loss 10/05/2018   Osteopenia of lumbar spine 09/28/2018   Status post total knee replacement using cement, right 08/01/2018   Goals of care, counseling/discussion 06/30/2018   Status post total knee replacement using cement, left 03/28/2018   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 female, estrogen receptor positive ([place]) 09/09/2015    Orientation RESPIRATION BLADDER Height & Weight     Self, Time, Situation, Place    Incontinent Weight: 103.1 kg Height:  5\' 7"  (170.2 cm)  BEHAVIORAL SYMPTOMS/MOOD NEUROLOGICAL BOWEL NUTRITION STATUS      Incontinent Diet  AMBULATORY STATUS COMMUNICATION  OF NEEDS Skin   Extensive Assist Verbally                         Personal Care Assistance Level of Assistance  Bathing, Dressing Bathing Assistance: Maximum assistance   Dressing Assistance: Maximum assistance     Functional Limitations Info             SPECIAL CARE FACTORS FREQUENCY  PT (By licensed PT), OT (By licensed OT)     PT Frequency: 5x a week OT Frequency: 5x a week            Contractures Contractures Info: Not present    Additional Factors Info  Code Status, Allergies Code Status Info: DNR Allergies Info: mushroom extract, nichel, shellfish           Current Medications (10/08/2019):  This is the current hospital active medication list Current Facility-Administered Medications  Medication Dose Route Frequency Provider Last Rate Last Admin   acetaminophen (TYLENOL) tablet 650 mg  650 mg Oral Q6H PRN Schnier, Dolores Lory, MD   650 mg at 10/04/19 0025   Or   acetaminophen (TYLENOL) suppository 650 mg  650 mg Rectal Q6H PRN Schnier, Dolores Lory, MD       ascorbic acid (VITAMIN C) tablet 1,000 mg  1,000 mg Oral Daily Schnier, Dolores Lory, MD   1,000 mg at 10/07/19 3295   Chlorhexidine Gluconate Cloth 2 % PADS 6 each  6 each Topical Daily Gwynne Edinger, MD   6 each at 10/07/19 [phone]   cholecalciferol (VITAMIN D3) tablet 5,000 Units  5,000 Units Oral  BID Delana Meyer Dolores Lory, MD   5,000 Units at 10/08/19 0039   enoxaparin (LOVENOX) injection 40 mg  40 mg Subcutaneous Q24H Schnier, Dolores Lory, MD   40 mg at 10/08/19 0041   HYDROmorphone (DILAUDID) injection 1 mg  1 mg Intravenous Once PRN Schnier, Dolores Lory, MD       magnesium hydroxide (MILK OF MAGNESIA) suspension 30 mL  30 mL Oral Daily PRN Schnier, Dolores Lory, MD       metoprolol tartrate (LOPRESSOR) tablet 25 mg  25 mg Oral BID Gwynne Edinger, MD   25 mg at 10/08/19 0924   ondansetron (ZOFRAN) tablet 4 mg  4 mg Oral Q6H PRN Schnier, Dolores Lory, MD       Or   ondansetron [facility]) injection 4  mg  4 mg Intravenous Q6H PRN Schnier, Dolores Lory, MD   4 mg at 10/05/19 0902   ondansetron (ZOFRAN) injection 4 mg  4 mg Intravenous Q6H PRN Schnier, Dolores Lory, MD       oxyCODONE (Oxy IR/ROXICODONE) immediate release tablet 5 mg  5 mg Oral Q6H PRN Schnier, Dolores Lory, MD   5 mg at 10/05/19 0358   predniSONE (DELTASONE) tablet 15 mg  15 mg Oral Daily Schnier, Dolores Lory, MD   15 mg at 10/08/19 1572   sodium chloride flush (NS) 0.9 % injection 10-40 mL  10-40 mL Intracatheter Q12H Wouk, Ailene Rud, MD   10 mL at 10/08/19 0042   sodium chloride flush (NS) 0.9 % injection 10-40 mL  10-40 mL Intracatheter PRN Wouk, Ailene Rud, MD       traZODone (DESYREL) tablet 25 mg  25 mg Oral QHS PRN Schnier, Dolores Lory, MD   25 mg at 10/01/19 2351   Facility-Administered Medications Ordered in Other Encounters  Medication Dose Route Frequency Provider Last Rate Last Admin   sodium chloride 0.9 % injection 10 mL  10 mL Intravenous PRN [name], MD   10 mL at 09/12/14 1014     Discharge Medications: Please see discharge summary for a list of discharge medications.  Relevant Imaging Results:  Relevant Lab Results:   Additional Information # [id number], patient is undergoing chemo at [facility]  Victorino Dike, RN

## 2019-10-08 NOTE — NC FL2 (Deleted)
[place] LEVEL OF CARE SCREENING TOOL     IDENTIFICATION  Patient Name: Faith [name] Birthdate: 08/28/1945 Sex: female Admission Date (Current Location): 09/30/2019  [place] and [place] Number:  Engineering geologist and Address:  [facility], [address], [place], [place] [address]      Provider Number: [id number]  Attending Physician Name and Address:  Gwynne Edinger, MD  Relative Name and Phone Number:  Joene Gelder  [phone]    Current Level of Care: Hospital Recommended Level of Care: [place] Prior Approval Number:    Date Approved/Denied:   PASRR Number: [id number]  Discharge Plan: SNF    Current Diagnoses: Patient Active Problem List   Diagnosis Date Noted  . Palliative care encounter   . Leukocytosis 10/01/2019  . Elevated bilirubin 09/28/2019  . Hypotension 09/28/2019  . Near syncope 09/28/2019  . Hypertension 09/28/2019  . HLD (hyperlipidemia) 09/28/2019  . Acute renal failure superimposed on stage 3a chronic kidney disease ([place]) 09/28/2019  . Elevated troponin 09/28/2019  . CAD (coronary artery disease) 09/28/2019  . Liver metastasis ([place]) 09/25/2019  . Bone metastasis ([place]) 09/09/2019  . Elevated LFTs 09/06/2019  . Elevated ferritin 09/06/2019  . Liver masses 09/06/2019  . Weight loss 10/05/2018  . Osteopenia of lumbar spine 09/28/2018  . Status post total knee replacement using cement, right 08/01/2018  . Goals of care, counseling/discussion 06/30/2018  . Status post total knee replacement using cement, left 03/28/2018  . 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 female, estrogen receptor positive ([place]) 09/09/2015    Orientation RESPIRATION BLADDER Height & Weight     Self, Time, Situation, Place    Incontinent Weight: 103.1 kg Height:  5\' 7"  (170.2 cm)  BEHAVIORAL SYMPTOMS/MOOD NEUROLOGICAL BOWEL NUTRITION STATUS      Incontinent Diet  AMBULATORY STATUS COMMUNICATION  OF NEEDS Skin   Independent Verbally                         Personal Care Assistance Level of Assistance  Bathing, Dressing Bathing Assistance: Maximum assistance   Dressing Assistance: Maximum assistance     Functional Limitations Info             SPECIAL CARE FACTORS FREQUENCY  PT (By licensed PT), OT (By licensed OT)     PT Frequency: 5x a week OT Frequency: 5x a week            Contractures Contractures Info: Not present    Additional Factors Info  Code Status, Allergies Code Status Info: DNR Allergies Info: mushroom extract, nichel, shellfish           Current Medications (10/08/2019):  This is the current hospital active medication list Current Facility-Administered Medications  Medication Dose Route Frequency Provider Last Rate Last Admin  . acetaminophen (TYLENOL) tablet 650 mg  650 mg Oral Q6H PRN Schnier, Dolores Lory, MD   650 mg at 10/04/19 0025   Or  . acetaminophen (TYLENOL) suppository 650 mg  650 mg Rectal Q6H PRN Schnier, Dolores Lory, MD      . ascorbic acid (VITAMIN C) tablet 1,000 mg  1,000 mg Oral Daily Schnier, Dolores Lory, MD   1,000 mg at 10/07/19 0936  . Chlorhexidine Gluconate Cloth 2 % PADS 6 each  6 each Topical Daily Gwynne Edinger, MD   6 each at 10/07/19 [phone]  . cholecalciferol (VITAMIN D3) tablet 5,000 Units  5,000 Units Oral BID  Schnier, Dolores Lory, MD   5,000 Units at 10/08/19 [phone]  . enoxaparin (LOVENOX) injection 40 mg  40 mg Subcutaneous Q24H Schnier, Dolores Lory, MD   40 mg at 10/08/19 0041  . HYDROmorphone (DILAUDID) injection 1 mg  1 mg Intravenous Once PRN Schnier, Dolores Lory, MD      . magnesium hydroxide (MILK OF MAGNESIA) suspension 30 mL  30 mL Oral Daily PRN Schnier, Dolores Lory, MD      . metoprolol tartrate (LOPRESSOR) tablet 25 mg  25 mg Oral BID Gwynne Edinger, MD   25 mg at 10/08/19 0924  . ondansetron (ZOFRAN) tablet 4 mg  4 mg Oral Q6H PRN Schnier, Dolores Lory, MD       Or  . ondansetron [facility]) injection 4 mg  4  mg Intravenous Q6H PRN Schnier, Dolores Lory, MD   4 mg at 10/05/19 0902  . ondansetron (ZOFRAN) injection 4 mg  4 mg Intravenous Q6H PRN Schnier, Dolores Lory, MD      . oxyCODONE (Oxy IR/ROXICODONE) immediate release tablet 5 mg  5 mg Oral Q6H PRN Schnier, Dolores Lory, MD   5 mg at 10/05/19 0358  . predniSONE (DELTASONE) tablet 15 mg  15 mg Oral Daily Schnier, Dolores Lory, MD   15 mg at 10/08/19 0923  . sodium chloride flush (NS) 0.9 % injection 10-40 mL  10-40 mL Intracatheter Q12H Wouk, Ailene Rud, MD   10 mL at 10/08/19 0042  . sodium chloride flush (NS) 0.9 % injection 10-40 mL  10-40 mL Intracatheter PRN Wouk, Ailene Rud, MD      . traZODone (DESYREL) tablet 25 mg  25 mg Oral QHS PRN Schnier, Dolores Lory, MD   25 mg at 10/01/19 2351   Facility-Administered Medications Ordered in Other Encounters  Medication Dose Route Frequency Provider Last Rate Last Admin  . sodium chloride 0.9 % injection 10 mL  10 mL Intravenous PRN [name], MD   10 mL at 09/12/14 1014     Discharge Medications: Please see discharge summary for a list of discharge medications.  Relevant Imaging Results:  Relevant Lab Results:   Additional Information # [id number], patient is undergoing chemo at [facility]  Victorino Dike, RN

## 2019-10-09 NOTE — Progress Notes (Signed)
Mobility Specialist - Progress Note   10/09/19 1223  Mobility  Activity Refused mobility  Mobility performed by Mobility specialist     Pt refused mobility session at this time d/t feeling "weak" and "tired". Will re-attempt at a later date/time.    Daelynn Blower Mobility Specialist  10/09/19, 12:25 PM

## 2019-10-09 NOTE — Progress Notes (Signed)
Physical Therapy Treatment Patient Details Name: Faith [name] MRN: [id number] DOB: 06-Sep-1945 Today's Date: 10/09/2019    History of Present Illness 74 y.o. female with a known history of coronary artery disease, type diabetes mellitus, hypertension, dyslipidemia, presented to the emergency room with acute onset of generalized weakness and lightheadedness as well as abdominal pain.  He was admitted here on 9/10 and discharged yesterday when she was managed for hypotension and presyncope with acute kidney injury on stage IIIa chronic kidney disease.  She stated that she felt fine when she went home yesterday till this afternoon when she felt weak and dizzy and could not stand or walk.  She admitted to associated abdominal pain epigastric and right upper quadrant area.  She denied any nausea or vomiting or diarrhea.  No fever or chills.  No chest pain or dyspnea or cough or wheezing.  No dysuria, oliguria, urgency or frequency or flank pain    PT Comments    Chart reviewed, treatment attempted, pt visiting with husband at bedside, asks for author to return later. Author returns after lunch, pt appears aware upon entry, but remains somnolent in presentation throughout most of session. Pt most of time is responsive to cues for exercises and gives hypophonic verbal response, but toward end of bed exercises requires increasingly more cues to participate in exercises. Pt given max-total for coming supine to sitting EOB, once there she is more alert, better able to participate. Pt able to sit unsupported for 5 minutes, uses intermittent RUE support of trunk, pt educated on using hands on knees for postural support. Pt fatigued and assisted back to supine in bed, [name] +2 assist, heel floated. Noted edema in 4 limbs, bilat dorsal pedal edema = bilat, but LUE grossly more edematous throughout. 92% SpO2 in session.   Follow Up Recommendations  SNF;Supervision/Assistance - 24 hour     Equipment Recommendations   None recommended by PT    Recommendations for Other Services       Precautions / Restrictions Precautions Precautions: Fall Precaution Comments: edematous 4 limbs, particularly the left upper Restrictions Weight Bearing Restrictions: No    Mobility  Bed Mobility Overal bed mobility: Needs Assistance Bed Mobility: Supine to Sit;Sit to Supine     Supine to sit: Max assist Sit to supine: Total assist;+2 for physical assistance   General bed mobility comments: able to sit at EOB unsupported for 5 minutes, needs help with postural reset 1x  Transfers Overall transfer level:  (too weak, sleepy, tired to attempt)                  Ambulation/Gait                 Stairs             Wheelchair Mobility    Modified Rankin (Stroke Patients Only)       Balance Overall balance assessment: Needs assistance Sitting-balance support: Single extremity supported;Feet supported Sitting balance-Leahy Scale: Poor                                      Cognition Arousal/Alertness: Lethargic Behavior During Therapy: Flat affect Overall Cognitive Status: Within Functional Limits for tasks assessed  Exercises Other Exercises Other Exercises: Supine Bed exercises 1x10 bilat: heel slides, leg press, ankle pumps, hip ABDCT/add heel slides Other Exercises: Supine Bed exercises: 1x10 RLE SAQ (fading performance and participation due to somnolence, despite constant cues) Other Exercises: slumped sitting EOB to erect upright trunk 1x8 [name] minA/tactile cues at bilat SCJ/T10    General Comments        Pertinent Vitals/Pain Pain Assessment: No/denies pain    Home Living                      Prior Function            PT Goals (current goals can now be found in the care plan section) Acute Rehab PT Goals Patient Stated Goal: go home PT Goal Formulation: With patient Time For Goal  Achievement: 10/18/19 Potential to Achieve Goals: Poor Progress towards PT goals: Not progressing toward goals - comment    Frequency    Min 2X/week      PT Plan Current plan remains appropriate    Co-evaluation              AM-PAC PT "6 Clicks" Mobility   Outcome Measure  Help needed turning from your back to your side while in a flat bed without using bedrails?: Total Help needed moving from lying on your back to sitting on the side of a flat bed without using bedrails?: Total Help needed moving to and from a bed to a chair (including a wheelchair)?: Total Help needed standing up from a chair using your arms (e.g., wheelchair or bedside chair)?: Total Help needed to walk in hospital room?: Total Help needed climbing 3-5 steps with a railing? : Total 6 Click Score: 6    End of Session   Activity Tolerance: Patient limited by lethargy;Patient limited by fatigue Patient left: in bed;with bed alarm set;with call bell/phone within reach Nurse Communication: Mobility status PT Visit Diagnosis: Muscle weakness (generalized) (M62.81);Difficulty in walking, not elsewhere classified (R26.2)     Time: 8850-2774 PT Time Calculation (min) (ACUTE ONLY): 25 min  Charges:  $Therapeutic Exercise: 23-37 mins                     2:06 PM, 10/09/19 Faith [name], PT, DPT Physical Therapist - [facility]  [phone] ([place])    Faith [name] 10/09/2019, 2:02 PM

## 2019-10-09 NOTE — Progress Notes (Signed)
Ch went to do an AD education. Pt husband had left. He will be back in the morning. Ch will follow-up with Pt and husband tomorrow.

## 2019-10-09 NOTE — TOC Progression Note (Signed)
Transition of Care [facility] - Progression Note    Patient Details  Name: Faith [name] MRN: [id number] Date of Birth: May 31, 1945  Transition of Care [facility]) CM/SW [place], RN Phone Number: 10/09/2019, 4:55 PM  Clinical Narrative:     Spoke with patient and husband.  Explained discharge planning options at this time.  We discussed difficulty with transportation and no true bed offers from facilities with need for transportation to cancer center.  Discussed patient's need to make progress in therapy for insurance to pay for Short term rehab.  Discussed chemotherapy and that some people choose to delay chemotherapy and then go to rehab.  Explained I did not know if that was an option.  Patient expressed she did not want to continue chemo and that she was ready "to go to heaven"  This turned to a difficult discussion with a lot of emotions.  Spoke with patient and then went to speak with husband who left the room.  We continued to discuss options on a bench.  We then agreed that I would talk with dr and try and get prognosis or an idea of progression for patient without chemo.  He does not want his wife to suffer, but is not sure of his own capabilities on how well he can care for her at home.          Expected Discharge Plan and Services                                                 Social Determinants of Health (SDOH) Interventions    Readmission Risk Interventions No flowsheet data found.

## 2019-10-09 NOTE — Progress Notes (Signed)
ID Pt has extensive liver tumor burden Started chemo ID will sign off as no active infection  issues

## 2019-10-09 NOTE — Progress Notes (Signed)
PROGRESS NOTE    Faith [name]   MAU:[id number]  DOB: 01/11/1946  PCP: Leonel Ramsay, MD    DOA: 09/30/2019 LOS: 9   Brief Narrative   [name] a21 y.o.femalewith a known history of coronary artery disease, type diabetes mellitus, hypertension, dyslipidemia, [name] She stated that she felt fine when she went home yesterday till this afternoon when she felt weak and dizzy and could not stand or walk. She admitted to associated abdominal pain epigastric and right upper quadrant area. She denied any nausea or vomiting or diarrhea. No fever or chills. No chest pain or dyspnea or cough or wheezing. No dysuria, oliguria, urgency or frequency or flank pain.  Upon presentation to the emergency room, hypertension was 89.81F/31.7C,blood pressure 72/44 with a heart rate of 96 respiratory to 24 and pulse 70 was 98% on room air.With clear bolus of IV normal saline her blood pressure was up to 130/48. Labs were remarkable for anion gap of 16, magnesium of 1.9 with a potassium of 3.8 BUN of 17 with a creatinine of 1.54 compared to 21 and 0.97 on 9/11, high-sensitivity troponin I 160 and later 111, AST 358 ALT of 218 and alk phos of 900 with total bili of 2.2 albumin of 3 and total protein of 6.5.The patient's alk phos was 669 on 9/10 with total bili of 2.3, AST is 225 and ALT 177. Lactic acid came back 5.9 and later down to 4.4 from 1.8 on 9/10. CBC showed leukocytosis of 13.2 with neutrophilia as well as thrombocytopenia of 135. WBCs were 10 on 9/11. At 500 mL D-dimer was more than 7500, Tylenol level 10 and salicylate less than 7. TSH was 4.2 and free T4 1.37 both slightly elevated. Blood cultures were  drawn. EKG showed sinus rhythm with rate of 98 with occasional PVCs and possible left atrial enlargement. Right upper quadrant ultrasound revealed the following: 1. Cholelithiasis without definite sonographic evidence for acute cholecystitis. 2. Mildly thickened gallbladder wall which is nonspecific but can be seen in patients with underlying hepatocellular disease. 3. Innumerable hepatic masses are again noted, consistent with metastatic disease.  The patient was given IV cefepime and Flagyl, IV Zofran and 3 L bolus of IV normal saline. She will be admitted to a progressive unit bed for further evaluation and management.   Assessment & Plan   Active Problems:   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 female, estrogen receptor positive ([place])   Elevated LFTs   Bone metastasis (HCC)   Liver metastasis (HCC)   HLD (hyperlipidemia)   Acute renal failure superimposed on stage 3a chronic kidney disease (HCC)   Leukocytosis   Palliative care encounter   1. SIRS. Sepsis ruled out. No clear source of infection. She did present with hypotension, elevated lactate, hypothermia and tachypnea.  No evidence of pneumonia on chest imaging.  Treated with IV abx 9/13-9/16. Cultures negative.   Overall hemodynamics have improved with IV fluids that have now been discontinued. Appreciate ID recs, noticed was not continued hom home prednisone, have restarted that. AM cortisol wnl and hemodynamics have improved, think less likely adrenal insufficiency.  Initial plans were for paracentesis to rule out SBP, but she does not have significant ascites to warrant paracentesis. MRCP w/o signs  infection/obstruction. Now providing supportive care. 2. Elevated liver enzymes.  Initially, based on imaging there was concern for cholecystitis.  Seen by general surgery high-dose felt unlikely that patient had cholecystitis and they have signed off.  She is also being seen by GI and it is thought that her elevated  liver enzymes likely related to underlying cancer. MRCP w/o signs obstruction/infection. Uptrending LFTs likely consequence of progressing cancer. Trend labs every other day. 3. Discharge planning: today social work says she has found a snf that will accept patient but family will need to pay cost of transporting patient. Home w/ home health an option but husband doesn't think he can manage that. He is very frustrated. 4. Acute kidney injury. Likely related to hypotension. Resolved and off IV fluids 5. Left breast cancer with metastases to liver and bone.  Oncology consulted. Port placed 9/17, weekly chemo began 9/17 6. Hematoma at port-a-cath site - resolving 7. CAD.  Holding statin in light of elevated liver enzymes. Resumed home metoprolol 8. Hx GCA - resumed home prednisone 9. History of hypertension. Have resumed home metoprolol. BPs wnl    Obesity: Body mass index is 35.38 kg/m.  Complicates overall care and prognosis.  DVT prophylaxis: enoxaparin (LOVENOX) injection 40 mg Start: 10/01/19 0000   Diet:  Diet Orders (From admission, onward)    Start     Ordered   10/05/19 1537  Diet regular Room service appropriate? Yes; Fluid consistency: Thin  Diet effective now       Question Answer Comment  Room service appropriate? Yes   Fluid consistency: Thin      10/05/19 1536            Code Status: DNR    Subjective 10/09/19    Patient seen at bedside this PM.  Continues to complain of fatigue and poor appetite. Says last night was "pretty good." no nausea or vomiting.  Disposition Plan & Communication   Status is: Inpatient  Remains inpatient appropriate because:working on placement   Dispo: The patient is from: Home              Anticipated d/c is to: home with home health vs SNF if that can be arranged              Anticipated d/c date is: 1-2 days              Patient currently is not safe to discharge home.  Family Communication: husband updated at  bedside   Consults, Procedures, Significant Events   Consultants:   Oncology  ID  GI (signed off)  Surgery (signed off)  Palliative care  Vascular surgery  Procedures:  right IJ Infuse-a-Port placed 9/17  Antimicrobials:  Anti-infectives (From admission, onward)   Start     Dose/Rate Route Frequency Ordered Stop   10/05/19 1300  ceFAZolin (ANCEF) IVPB 2g/100 mL premix        2 g 200 mL/hr over 30 Minutes Intravenous  Once 10/05/19 1253 10/05/19 1445   10/02/19 0200  vancomycin (VANCOCIN) IVPB 1000 mg/200 mL premix  Status:  Discontinued        1,000 mg 200 mL/hr over 60 Minutes Intravenous Every 24 hours 10/01/19 0316 10/01/19 1208   10/01/19 1000  ceFEPIme (MAXIPIME) 2 g in sodium chloride 0.9 % 100 mL IVPB  Status:  Discontinued        2 g 200 mL/hr over 30 Minutes Intravenous Every 12 hours 09/30/19 2356 10/04/19 1709   10/01/19 0600  metroNIDAZOLE (FLAGYL) IVPB 500 mg  Status:  Discontinued        500 mg 100 mL/hr over 60 Minutes Intravenous Every 8 hours 09/30/19 2356 10/04/19 1709   10/01/19 0000  vancomycin (VANCOREADY) IVPB 1750 mg/350 mL        1,750 mg 175 mL/hr over 120 Minutes Intravenous  Once 09/30/19 2356 10/01/19 0426   09/30/19 2045  ceFEPIme (MAXIPIME) 2 g in sodium chloride 0.9 % 100 mL IVPB        2 g 200 mL/hr over 30 Minutes Intravenous  Once 09/30/19 2041 09/30/19 2157   09/30/19 2045  metroNIDAZOLE (FLAGYL) IVPB 500 mg        500 mg 100 mL/hr over 60 Minutes Intravenous  Once 09/30/19 2041 09/30/19 2257         Objective   Vitals:   10/09/19 0039 10/09/19 0551 10/09/19 0734 10/09/19 1052  BP:  (!) 114/45 (!) 122/49 (!) 139/57  Pulse:  78 77 89  Resp:  '20 16 17  ' Temp:  98.3 F (36.8 C) 97.7 F (36.5 C) 97.8 F (36.6 C)  TempSrc:   Oral Oral  SpO2:  93% 93% 92%  Weight: 102.5 kg     Height:        Intake/Output Summary (Last 24 hours) at 10/09/2019 1122 Last data filed at 10/09/2019 0950 Gross per 24 hour  Intake 120 ml   Output 1250 ml  Net -1130 ml   Filed Weights   10/07/19 0434 10/08/19 0636 10/09/19 0039  Weight: 102.8 kg 103.1 kg 102.5 kg    Physical Exam:  General exam: sleepy but arousable, no acute distress. Chronically ill appearing Respiratory system: CTAB, normal respiratory effort. Cardiovascular system: normal S1/S2, RRR, chronic lymphedema.   Gastrointestinal system: soft, protuberant but not distended, +bowel sounds. Central nervous system: A&O x4. no gross focal neurologic deficits, normal speech Extremities: moves all, 1+ edema LEs Psychiatry:depressed mood  Labs   Data Reviewed: I have personally reviewed following labs and imaging studies  CBC: Recent Labs  Lab 10/05/19 0705 10/08/19 0602  WBC 15.7*  16.5* 11.4*  NEUTROABS 11.6*  --   HGB 13.6  13.1 12.3  HCT 40.7  38.4 37.7  MCV 87.2  84.4 90.2  PLT 110*  123* 83*   Basic Metabolic Panel: Recent Labs  Lab 10/04/19 0605 10/05/19 0705 10/06/19 0747 10/07/19 0409 10/08/19 0601  NA 142 142 141 140 140  K 3.6 4.1 3.9 3.9 3.9  CL 113* 114* 112* 110 110  CO2 18* 16* 16* 21* 21*  GLUCOSE 103* 88 139* 147* 115*  BUN 24* 23 25* 28* 26*  CREATININE 1.39* 1.26* 1.13* 0.96 0.73  CALCIUM 8.8* 9.0 8.8* 8.6* 8.6*   GFR: Estimated Creatinine Clearance: 74.8 mL/min (by C-G formula based on SCr of 0.73 mg/dL). Liver Function Tests: Recent Labs  Lab 10/04/19 0605 10/05/19 0705 10/06/19 0747 10/07/19 0409 10/08/19 0601  AST 287* 311* 333* 434* 458*  ALT 172* 171* 153* 164* 167*  ALKPHOS 681* 754* 939* 980* 1,021*  BILITOT 3.1* 3.3* 3.8* 3.4* 4.3*  PROT 5.2* 5.2* 5.4* 5.2* 4.9*  ALBUMIN 2.2* 2.1* 2.3* 2.1* 2.1*   No results for input(s): LIPASE, AMYLASE in the last 168 hours. No results for input(s): AMMONIA in the last 168 hours. Coagulation Profile: No results for input(s): INR, PROTIME in the last 168 hours. Cardiac Enzymes: No results for input(s): CKTOTAL, CKMB, CKMBINDEX, TROPONINI in the last 168  hours. BNP (last 3 results) No results  for input(s): PROBNP in the last 8760 hours. HbA1C: No results for input(s): HGBA1C in the last 72 hours. CBG: No results for input(s): GLUCAP in the last 168 hours. Lipid Profile: No results for input(s): CHOL, HDL, LDLCALC, TRIG, CHOLHDL, LDLDIRECT in the last 72 hours. Thyroid Function Tests: No results for input(s): TSH, T4TOTAL, FREET4, T3FREE, THYROIDAB in the last 72 hours. Anemia Panel: No results for input(s): VITAMINB12, FOLATE, FERRITIN, TIBC, IRON, RETICCTPCT in the last 72 hours. Sepsis Labs: Recent Labs  Lab 10/03/19 0406  PROCALCITON 2.36    Recent Results (from the past 240 hour(s))  Blood culture (single)     Status: None   Collection Time: 09/30/19  7:47 PM   Specimen: BLOOD  Result Value Ref Range Status   Specimen Description BLOOD RIGHT [facility]  Final   Special Requests   Final    BOTTLES DRAWN AEROBIC AND ANAEROBIC Blood Culture results may not be optimal due to an excessive volume of blood received in culture bottles   Culture   Final    NO GROWTH 5 DAYS Performed at [facility], [address], [place], [place] [address]    Report Status 10/05/2019 FINAL  Final  Culture, blood (single)     Status: None   Collection Time: 09/30/19  9:44 PM   Specimen: BLOOD  Result Value Ref Range Status   Specimen Description BLOOD RIGHT [facility]  Final   Special Requests   Final    BOTTLES DRAWN AEROBIC AND ANAEROBIC Blood Culture adequate volume   Culture   Final    NO GROWTH 5 DAYS Performed at [facility], [address] [place], [place] [address]    Report Status 10/05/2019 FINAL  Final  SARS Coronavirus 2 by RT PCR (hospital order, performed in [facility] hospital lab) Nasopharyngeal Nasopharyngeal Swab     Status: None   Collection Time: 10/01/19 12:38 AM   Specimen: Nasopharyngeal Swab  Result Value Ref Range Status   SARS Coronavirus 2 NEGATIVE NEGATIVE Final    Comment: (NOTE) SARS-CoV-2 target  nucleic acids are NOT DETECTED.  The SARS-CoV-2 RNA is generally detectable in upper and lower respiratory specimens during the acute phase of infection. The lowest concentration of SARS-CoV-2 viral copies this assay can detect is 250 copies / mL. A negative result does not preclude SARS-CoV-2 infection and should not be used as the sole basis for treatment or other patient management decisions.  A negative result may occur with improper specimen collection / handling, submission of specimen other than nasopharyngeal swab, presence of viral mutation(s) within the areas targeted by this assay, and inadequate number of viral copies (<250 copies / mL). A negative result must be combined with clinical observations, patient history, and epidemiological information.  Fact Sheet for Patients:   StrictlyIdeas.no  Fact Sheet for Healthcare Providers: BankingDealers.co.za  This test is not yet approved or  cleared by the [place] FDA and has been authorized for detection and/or diagnosis of SARS-CoV-2 by FDA under an Emergency Use Authorization (EUA).  This EUA will remain in effect (meaning this test can be used) for the duration of the COVID-19 declaration under Section 564(b)(1) of the Act, 21 U.S.C. section 360bbb-3(b)(1), unless the authorization is terminated or revoked sooner.  Performed at [facility], [address] [place], [place] [address]       Imaging Studies   No results found.   Medications   Scheduled Meds: . vitamin C  1,000 mg Oral Daily  .  Chlorhexidine Gluconate Cloth  6 each Topical Daily  . cholecalciferol  5,000 Units Oral BID  . enoxaparin (LOVENOX) injection  40 mg Subcutaneous Q24H  . metoprolol tartrate  25 mg Oral BID  . predniSONE  15 mg Oral Daily  . sodium chloride flush  10-40 mL Intracatheter Q12H   Continuous Infusions:      LOS: 9 days    Time spent: 30  minutes    Desma Maxim, MD [facility]  10/09/2019, 11:22 AM    If 7PM-7AM, please contact night-coverage.

## 2019-10-10 LAB — CBC WITH DIFFERENTIAL/PLATELET
Abs Immature Granulocytes: 0.16 10*3/uL — ABNORMAL HIGH (ref 0.00–0.07)
Basophils Absolute: 0.1 10*3/uL (ref 0.0–0.1)
Basophils Relative: 1 %
Eosinophils Absolute: 0 10*3/uL (ref 0.0–0.5)
Eosinophils Relative: 0 %
HCT: 37.1 % (ref 36.0–46.0)
Hemoglobin: 12.3 g/dL (ref 12.0–15.0)
Immature Granulocytes: 2 %
Lymphocytes Relative: 11 %
Lymphs Abs: 0.8 10*3/uL (ref 0.7–4.0)
MCH: 29.1 pg (ref 26.0–34.0)
MCHC: 33.2 g/dL (ref 30.0–36.0)
MCV: 87.7 fL (ref 80.0–100.0)
Monocytes Absolute: 0.2 10*3/uL (ref 0.1–1.0)
Monocytes Relative: 3 %
Neutro Abs: 5.9 10*3/uL (ref 1.7–7.7)
Neutrophils Relative %: 83 %
Platelets: 94 10*3/uL — ABNORMAL LOW (ref 150–400)
RBC: 4.23 MIL/uL (ref 3.87–5.11)
RDW: 18.5 % — ABNORMAL HIGH (ref 11.5–15.5)
WBC: 7.1 10*3/uL (ref 4.0–10.5)
nRBC: 1.4 % — ABNORMAL HIGH (ref 0.0–0.2)

## 2019-10-10 LAB — COMPREHENSIVE METABOLIC PANEL
ALT: 132 U/L — ABNORMAL HIGH (ref 0–44)
AST: 301 U/L — ABNORMAL HIGH (ref 15–41)
Albumin: 1.9 g/dL — ABNORMAL LOW (ref 3.5–5.0)
Alkaline Phosphatase: 1140 U/L — ABNORMAL HIGH (ref 38–126)
Anion gap: 8 (ref 5–15)
BUN: 22 mg/dL (ref 8–23)
CO2: 25 mmol/L (ref 22–32)
Calcium: 8.6 mg/dL — ABNORMAL LOW (ref 8.9–10.3)
Chloride: 110 mmol/L (ref 98–111)
Creatinine, Ser: 0.8 mg/dL (ref 0.44–1.00)
GFR calc Af Amer: >60 mL/min (ref >60)
GFR calc non Af Amer: >60 mL/min (ref >60)
Glucose, Bld: 98 mg/dL (ref 70–99)
Potassium: 4.1 mmol/L (ref 3.5–5.1)
Sodium: 143 mmol/L (ref 135–145)
Total Bilirubin: 4.4 mg/dL — ABNORMAL HIGH (ref 0.3–1.2)
Total Protein: 4.9 g/dL — ABNORMAL LOW (ref 6.5–8.1)

## 2019-10-10 MED ORDER — ACETAMINOPHEN 325 MG PO TABS: 650.0000 mg | ORAL_TABLET | Freq: Four times a day (QID) | ORAL | Status: AC | PRN

## 2019-10-10 MED ORDER — TRAZODONE HCL 50 MG PO TABS: 25.0000 mg | ORAL_TABLET | Freq: Every evening | ORAL | Status: AC | PRN

## 2019-10-10 MED ORDER — MAGNESIUM HYDROXIDE 400 MG/5ML PO SUSP: 30.0000 mL | Freq: Every day | ORAL | 0 refills | Status: AC | PRN

## 2019-10-10 MED ORDER — OXYCODONE HCL 5 MG PO TABS
5.0000 mg | ORAL_TABLET | Freq: Four times a day (QID) | ORAL | 0 refills | Status: AC | PRN
Start: 2019-10-10 — End: ?

## 2019-10-10 MED ORDER — PREDNISONE 5 MG PO TABS: 15.0000 mg | ORAL_TABLET | Freq: Every day | ORAL | Status: AC

## 2019-10-10 NOTE — Progress Notes (Signed)
PT Cancellation Note  Patient Details Name: Faith [name] MRN: [id number] DOB: 02-17-1945   Cancelled Treatment:    Reason Eval/Treat Not Completed: Other (comment) (Pt moving to comfort care. Will DC patient from PT services.)  12:38 PM, 10/10/19 Etta Grandchild, PT, DPT Physical Therapist - [facility]  [phone] ([place])    [place] C 10/10/2019, 12:38 PM

## 2019-10-10 NOTE — Progress Notes (Signed)
OT Cancellation Note  Patient Details Name: Faith [name] MRN: [id number] DOB: 05/11/1945   Cancelled Treatment:    Reason Eval/Treat Not Completed: Other (comment)  Upon chart review this AM, palliative note indicates that pt would like to stop chemotherapy and focus on end-of-life/comfort care. Will complete OT order at this time. Thank you for allowing OT to participate in the care of this patient.   Gerrianne Scale, [place], OTR/L ascom [phone] 10/10/19, 11:38 AM

## 2019-10-10 NOTE — Progress Notes (Signed)
[facility] Liaison note: New referral for [facility] hospice home received from Palliative NP [place], TOC Ginnie Russoli aware.  Patient information sent to referral. Hospice home eligibility has been confirmed by hospice physician Dr. Gilford Rile.   Writer met in the room with patient's husband Shanon Brow to initiate education regarding hospice services, philosophy, team approach to care and current visitation policy with understanding voiced. Questions answered. Plan is for discharge to the hospice home this afternoon. Patient and family aware and in agreement. 5 PM pickup has been arranged with [facility] by [employer]. Report called to the hospice home. Thank you for the opportunity to be involved in  the care of this patient and her family. Flo Shanks [facility], [place] [phone]

## 2019-10-10 NOTE — Progress Notes (Signed)
Aracely Rickett Montanaro to be D/C'd to [place]  per MD order. Per [facility]) okay to leave port accessed.   Allergies as of 10/10/2019      Reactions   Mushroom Extract Complex Hives, Swelling   Nickel Rash   Shellfish Allergy Nausea And Vomiting      Medication List    STOP taking these medications   alendronate 70 MG tablet Commonly known as: FOSAMAX   aspirin EC 81 MG tablet   atorvastatin 80 MG tablet Commonly known as: LIPITOR   Biotin 5 MG Caps   CoQ10 100 MG Caps   losartan 50 MG tablet Commonly known as: COZAAR   sulfamethoxazole-trimethoprim 800-160 MG tablet Commonly known as: BACTRIM DS     TAKE these medications   acetaminophen 325 MG tablet Commonly known as: TYLENOL Take 2 tablets (650 mg total) by mouth every 6 (six) hours as needed for mild pain (or Fever >/= 101).   Cholecalciferol 125 MCG (5000 UT) Tabs Take 1 capsule by mouth 2 (two) times daily.   magnesium hydroxide 400 MG/5ML suspension Commonly known as: MILK OF MAGNESIA Take 30 mLs by mouth daily as needed for mild constipation.   metoprolol tartrate 25 MG tablet Commonly known as: LOPRESSOR Take 25 mg by mouth 2 (two) times daily.   ondansetron 8 MG tablet Commonly known as: Zofran Take 1 tablet (8 mg total) by mouth every 8 (eight) hours as needed (Nausea or vomiting).   oxyCODONE 5 MG immediate release tablet Commonly known as: Oxy IR/ROXICODONE Take 1 tablet (5 mg total) by mouth every 6 (six) hours as needed for breakthrough pain.   predniSONE 5 MG tablet Commonly known as: DELTASONE Take 3 tablets (15 mg total) by mouth daily. Start taking on: October 11, 2019 What changed:   medication strength  how much to take   traZODone 50 MG tablet Commonly known as: DESYREL Take 0.5 tablets (25 mg total) by mouth at bedtime as needed for sleep.   vitamin C 1000 MG tablet Take 1,000 mg by mouth daily.       Vitals:   10/10/19 1138 10/10/19 1650  BP: (!) 127/48  (!) 131/42  Pulse: 73 74  Resp: 18 18  Temp: 98.2 F (36.8 C) 98.3 F (36.8 C)  SpO2: 92% 94%     Patient escorted via stretcher, and D/C hospice home via [facility].  Rolley Sims

## 2019-10-10 NOTE — Progress Notes (Signed)
Mobility Specialist - Progress Note   10/10/19 1329  Mobility  Activity Contraindicated/medical hold  Mobility performed by Mobility specialist    Per chart review, pt moved to comfort care.    Kathee Delton Mobility Specialist 10/10/19, 1:31 PM

## 2019-10-10 NOTE — Progress Notes (Signed)
   Chaplain On-Call was paged by Salley Scarlet who requested Chaplain support for the patient's husband. Chaplain met patient and her husband Shanon Brow at bedside.  Chaplain provided spiritual and emotional support. Chaplain also received request from Shanon Brow to have the "Last Will and Testament" of the patient Notarized.  Chaplain made several phone calls in attempting to locate a Notary who is available today and willing to oversee this particular document.  Medical Interpreter Aundra Millet stated that she will be available to serve as a Patent examiner.  Chaplain Resident Bary Richard received information from this Chaplain, and he will manage the request this afternoon, including seeking to obtain two Volunteers to serve as Witnesses.   Patient's husband stated his appreciation for these efforts by the Chaplains.  Frazee Morayma [place] M.Div., [facility]

## 2019-10-10 NOTE — Progress Notes (Signed)
[place]  [phone] [phone]   Name: Faith [name] [facility] Date: 10/10/2019 MRN: [id number]  DOB: 12/28/1945  Patient Care Team: Leonel Ramsay, MD as PCP - General (Infectious Diseases) Hessie Knows, MD as Consulting Physician (Orthopedic Surgery) Lequita Asal, MD as Referring Physician (Hematology and Oncology) Lucy Antigua, MD as Referring Physician (Rheumatology)    REASON FOR CONSULTATION: Faith [name] is a 74 y.o. female with multiple medical problems including history of metastatic breast cancer status post bilateral mastectomies and chemotherapy, hypertension, hyperlipidemia, diabetes, CAD, and CKD stage III, who was recently hospitalized 09/28/2019 to 09/29/2019 with near syncope and hypotension.  Patient was readmitted 10/01/2019 with abdominal pain and weakness and was found to have possible SIRS although infectious source was unclear.  Patient was found to have progressively worse transaminitis with abdominal MRI showing significant tumor burden in the liver.  Decision was made to initiate chemotherapy.  Palliative care was consulted to help address goals and manage ongoing symptoms.   CODE STATUS: DNR  PAST MEDICAL HISTORY: Past Medical History:  Diagnosis Date   Arthritis    bilateral knees   Cancer ([place]) April 2015   left breast   Coronary artery disease    Diabetes mellitus without complication ([place])    Hyperlipidemia    Hypertension    Myocardial infarction ([place]) 11/03/2015   [facility], stent placed   Pneumonia     PAST SURGICAL HISTORY:  Past Surgical History:  Procedure Laterality Date   BREAST SURGERY Bilateral 11/19/13   bilater mastectomy    CARDIAC CATHETERIZATION     CORONARY ANGIOPLASTY     PORT-A-CATH REMOVAL  2016   PORTA CATH INSERTION N/A 10/05/2019   Procedure: PORTA CATH INSERTION;  Surgeon: Katha Cabal, MD;  Location: [place] [facility];   Service: Cardiovascular;  Laterality: N/A;   PORTACATH PLACEMENT  06-07-13   SHOULDER OPEN ROTATOR CUFF REPAIR  2012   TOTAL KNEE ARTHROPLASTY Left 03/28/2018   Procedure: TOTAL KNEE ARTHROPLASTY-LEFT;  Surgeon: Hessie Knows, MD;  Location: [facility] ORS;  Service: Orthopedics;  Laterality: Left;   TOTAL KNEE ARTHROPLASTY Right 08/01/2018   Procedure: RIGHT TOTAL KNEE ARTHROPLASTY;  Surgeon: Hessie Knows, MD;  Location: [facility] ORS;  Service: Orthopedics;  Laterality: Right;   WRIST SURGERY Left 1969   cyst    HEMATOLOGY/ONCOLOGY HISTORY:  Oncology History Overview Note  1. 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  Biopsy from the lymph node in the left axilla positive for metastatic memory carcinoma Estrogen receptor positive Progesterone receptor +ve hER-2/neu receptors equivocal  by [facility]  IHC for HER-2/neu is 2+ Clinically staged asT4 D. N1 M0 tumor stage IV  locally advanced carcinoma.. 2. She was started on   [place] OF 2015. 3. Treatment was changed to Cytoxan and Adriamycinffrom August 31, 2013  4.patient has finished oral 3 cycles of chemotherapy with Cytoxan and Adriamycin on October 19, 2013. 5.Patient had a bilateral mastectomy in November of 2015. ypT4B ypN3 ypM1 STAGE iv DISEASE.. repeat HER-2/neu receptor by [facility] is still equivocal (November, 2015).  6.  Started on Nov 2015-  letrozole and IBRANCE ; PET may 2017- NED; NOV 1st 2017- NED   # # Acute MI- s/p stenting [[facility]]  # RML 4 mm nodule- 1st NOV 2017- resolved ---------------------------------------------------------   DIAGNOSIS: BREAST CA  STAGE:  IV       ;GOALS: palliative  CURRENT/MOST RECENT THERAPY: Ibrance+ Femara    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 female, estrogen receptor positive ([place])  10/05/2019 -  Chemotherapy   The patient had PACLitaxel (TAXOL) 60 mg in sodium  chloride 0.9 % 150 mL chemo infusion (</= 65m/m2), 30 mg/m2 = 60 mg (37.5 % of original dose 80 mg/m2), Intravenous,  Once, 1 of 4 cycles Dose modification: 30 mg/m2 (original dose 80 mg/m2, Cycle 1, Reason: Provider Judgment, Comment: dose redcution for LFTs) Administration: 60 mg (10/05/2019)  for chemotherapy treatment.    Bone metastasis ([place]  09/09/2019 Initial Diagnosis   [place] metastasis ([place]   10/05/2019 -  Chemotherapy   The patient had PACLitaxel (TAXOL) 60 mg in sodium chloride 0.9 % 150 mL chemo infusion (</= 889mm2), 30 mg/m2 = 60 mg (37.5 % of original dose 80 mg/m2), Intravenous,  Once, 1 of 4 cycles Dose modification: 30 mg/m2 (original dose 80 mg/m2, Cycle 1, Reason: Provider Judgment, Comment: dose redcution for LFTs) Administration: 60 mg (10/05/2019)  for chemotherapy treatment.    Liver metastasis ([facility] 09/25/2019 Initial Diagnosis   Liver metastasis ([place]  10/05/2019 -  Chemotherapy   The patient had PACLitaxel (TAXOL) 60 mg in sodium chloride 0.9 % 150 mL chemo infusion (</= 8071m2), 30 mg/m2 = 60 mg (37.5 % of original dose 80 mg/m2), Intravenous,  Once, 1 of 4 cycles Dose modification: 30 mg/m2 (original dose 80 mg/m2, Cycle 1, Reason: Provider Judgment, Comment: dose redcution for LFTs) Administration: 60 mg (10/05/2019)  for chemotherapy treatment.      ALLERGIES:  is allergic to mushroom extract complex, nickel, and shellfish allergy.  MEDICATIONS:  Current Facility-Administered Medications  Medication Dose Route Frequency Provider Last Rate Last Admin   acetaminophen (TYLENOL) tablet 650 mg  650 mg Oral Q6H PRN Schnier, GreDolores LoryD   650 mg at 10/04/19 0025   Or   acetaminophen (TYLENOL) suppository 650 mg  650 mg Rectal Q6H PRN Schnier, GreDolores LoryD       ascorbic acid (VITAMIN C) tablet 1,000 mg  1,000 mg Oral Daily Schnier, GreDolores LoryD   1,000 mg at 10/07/19 0932010Chlorhexidine Gluconate Cloth 2 % PADS 6 each  6 each Topical Daily WouGwynne EdingerD   6 each at 10/10/19 [id number] (VITAMIN D3) tablet 5,000 Units  5,000 Units Oral BID SchKatha CabalD   5,000 Units at 10/09/19 2115   enoxaparin (LOVENOX) injection 40 mg  40 mg Subcutaneous Q24H Schnier, GreDolores LoryD   40 mg at 10/08/19 0041   magnesium hydroxide (MILK OF MAGNESIA) suspension 30 mL  30 mL Oral Daily PRN Schnier, GreDolores LoryD       metoprolol tartrate (LOPRESSOR) tablet 25 mg  25 mg Oral BID WouGwynne EdingerD   25 mg at 10/10/19 0852   ondansetron (ZOFRAN) tablet 4 mg  4 mg Oral Q6H PRN Schnier, GreDolores LoryD       Or   ondansetron (ZOBarnesville Hospital Association, Incnjection 4 mg  4 mg Intravenous Q6H PRN Schnier, GreDolores LoryD   4 mg at 10/05/19 0902   ondansetron (ZOFRAN) injection 4 mg  4 mg Intravenous Q6H PRN Schnier, GreDolores LoryD   4 mg at 10/09/19 0813   oxyCODONE (Oxy IR/ROXICODONE) immediate release tablet 5 mg  5 mg Oral Q6H PRN Schnier, GreDolores LoryD   5 mg at 10/10/19  0450   predniSONE (DELTASONE) tablet 15 mg  15 mg Oral Daily Schnier, Dolores Lory, MD   15 mg at 10/10/19 1950   sodium chloride flush (NS) 0.9 % injection 10-40 mL  10-40 mL Intracatheter Q12H Wouk, Ailene Rud, MD   10 mL at 10/10/19 0854   sodium chloride flush (NS) 0.9 % injection 10-40 mL  10-40 mL Intracatheter PRN Gwynne Edinger, MD       traZODone (DESYREL) tablet 25 mg  25 mg Oral QHS PRN Delana Meyer Dolores Lory, MD   25 mg at 10/01/19 2351   Facility-Administered Medications Ordered in Other Encounters  Medication Dose Route Frequency Provider Last Rate Last Admin   sodium chloride 0.9 % injection 10 mL  10 mL Intravenous PRN [name], MD   10 mL at 09/12/14 1014    VITAL SIGNS: BP (!) 128/49 (BP Location: Right Arm)    Pulse 77    Temp (!) 97.5 F (36.4 C) (Oral)    Resp 18    Ht _0  (1.702 m)    Wt 225 lb 6.4 oz (102.2 kg)    SpO2 93%    BMI 35.30 kg/m  Filed Weights   10/08/19 0636 10/09/19 0039 10/10/19 0513  Weight: 227 lb 4.7 oz (103.1 kg) 225 lb  14.4 oz (102.5 kg) 225 lb 6.4 oz (102.2 kg)    Estimated body mass index is 35.3 kg/m as calculated from the following:   Height as of this encounter: _1  (1.702 m).   Weight as of this encounter: 225 lb 6.4 oz (102.2 kg).  LABS: CBC:    Component Value Date/Time   WBC 7.1 10/10/2019 0708   HGB 12.3 10/10/2019 0708   HGB 12.4 05/16/2014 1154   HCT 37.1 10/10/2019 0708   HCT 36.1 05/16/2014 1154   PLT 94 (L) 10/10/2019 0708   PLT 289 05/16/2014 1154   MCV 87.7 10/10/2019 0708   MCV 96 05/16/2014 1154   NEUTROABS 5.9 10/10/2019 0708   NEUTROABS 2.4 05/16/2014 1154   LYMPHSABS 0.8 10/10/2019 0708   LYMPHSABS 1.0 05/16/2014 1154   MONOABS 0.2 10/10/2019 0708   MONOABS 0.2 05/16/2014 1154   EOSABS 0.0 10/10/2019 0708   EOSABS 0.0 05/16/2014 1154   BASOSABS 0.1 10/10/2019 0708   BASOSABS 0.1 05/16/2014 1154   Comprehensive Metabolic Panel:    Component Value Date/Time   NA 143 10/10/2019 0708   NA 137 05/16/2014 1154   K 4.1 10/10/2019 0708   K 3.9 05/16/2014 1154   CL 110 10/10/2019 0708   CL 104 05/16/2014 1154   CO2 25 10/10/2019 0708   CO2 25 05/16/2014 1154   BUN 22 10/10/2019 0708   BUN 20 05/16/2014 1154   CREATININE 0.80 10/10/2019 0708   CREATININE 1.00 05/16/2014 1154   GLUCOSE 98 10/10/2019 0708   GLUCOSE 110 (H) 05/16/2014 1154   CALCIUM 8.6 (L) 10/10/2019 0708   CALCIUM 9.6 05/16/2014 1154   AST 301 (H) 10/10/2019 0708   AST 26 05/16/2014 1154   ALT 132 (H) 10/10/2019 0708   ALT 37 05/16/2014 1154   ALKPHOS 1,140 (H) 10/10/2019 0708   ALKPHOS 72 05/16/2014 1154   BILITOT 4.4 (H) 10/10/2019 0708   BILITOT 0.5 05/16/2014 1154   PROT 4.9 (L) 10/10/2019 0708   PROT 7.4 05/16/2014 1154   ALBUMIN 1.9 (L) 10/10/2019 0708   ALBUMIN 4.0 05/16/2014 1154    RADIOGRAPHIC STUDIES: CT CHEST ABDOMEN PELVIS W CONTRAST  Result Date: 09/28/2019 CLINICAL  DATA:  Weakness and hypotension.  Metastatic breast cancer. EXAM: CT CHEST, ABDOMEN, AND PELVIS WITH  CONTRAST TECHNIQUE: Multidetector CT imaging of the chest, abdomen and pelvis was performed following the standard protocol during bolus administration of intravenous contrast. CONTRAST:  59m OMNIPAQUE IOHEXOL 300 MG/ML  SOLN COMPARISON:  CT abdomen dated September 06, 2019. CT chest dated September 07, 2018. FINDINGS: CT CHEST FINDINGS Cardiovascular: No significant vascular findings. Normal heart size. No pericardial effusion. No thoracic aortic aneurysm. Coronary, aortic arch, and branch vessel atherosclerotic vascular disease. Mediastinum/Nodes: No enlarged mediastinal, hilar, or axillary lymph nodes. Prior left axillary lymph node dissection. Unchanged 7 mm hypodense nodule in the right thyroid lobe. Not clinically significant; no follow-up imaging recommended. Trachea and esophagus demonstrate no significant findings. Lungs/Pleura: No focal consolidation, pleural effusion, or pneumothorax. New 3 mm nodule in the peripheral left lower lobe (series 4, image 107). No additional pulmonary nodules. Musculoskeletal: Innumerable patchy indistinct small sclerotic foci throughout the thoracic spine, sternum, and ribs, new since August 2020. Prior bilateral mastectomies. CT ABDOMEN PELVIS FINDINGS Hepatobiliary: Innumerable confluent hypoenhancing liver masses scattered throughout the liver are similar to recent CT. New trace perihepatic ascites. No gallstones or gallbladder wall thickening. No biliary dilatation. Pancreas: Unremarkable. No pancreatic ductal dilatation or surrounding inflammatory changes. Spleen: Normal in size without focal abnormality. Adrenals/Urinary Tract: Unchanged 1.2 cm right adrenal nodule. The left adrenal gland is unremarkable. Unchanged 3.9 cm simple cyst in the right kidney. No renal calculi or hydronephrosis. Bladder is unremarkable. Stomach/Bowel: Stomach is within normal limits. Appendix appears normal. No evidence of bowel wall thickening, distention, or inflammatory changes. Moderate  colonic diverticulosis. Vascular/Lymphatic: Aortic atherosclerosis. No enlarged abdominal or pelvic lymph nodes. Reproductive: Calcified uterine fibroids.  No adnexal mass. Other: Trace free fluid in the pelvis. No pneumoperitoneum. Small fat containing umbilical hernia. Musculoskeletal: Innumerable patchy indistinct small sclerotic foci scattered throughout the lumbar spine and pelvis are again noted. IMPRESSION: 1. No acute intrathoracic process. No acute intra-abdominal process. 2. Unchanged hepatic and osseous metastases. Unchanged suspected small right adrenal metastasis. 3. New 3 mm pulmonary nodule in the peripheral left lower lobe, indeterminate. Attention on follow-up. 4. New trace ascites. 5. Aortic Atherosclerosis (ICD10-I70.0). Electronically Signed   By: WTitus DubinM.D.   On: 09/28/2019 12:51   PERIPHERAL VASCULAR CATHETERIZATION  Result Date: 10/05/2019 See op note  MR 3D Recon At Scanner  Result Date: 10/04/2019 CLINICAL DATA:  Abdominal pain. Metastatic hepatic disease. EXAM: MRI ABDOMEN WITHOUT AND WITH CONTRAST (INCLUDING MRCP) TECHNIQUE: Multiplanar multisequence MR imaging of the abdomen was performed both before and after the administration of intravenous contrast. Heavily T2-weighted images of the biliary and pancreatic ducts were obtained, and three-dimensional MRCP images were rendered by post processing. CONTRAST:  949mGADAVIST GADOBUTROL 1 MMOL/ML IV SOLN COMPARISON:  CT scan 09/30/2019 and 09/28/2019 FINDINGS: Lower chest: The lung bases are grossly clear. Small bilateral pleural effusions and overlying atelectasis. Hepatobiliary: Diffuse/extensive metastatic disease throughout both lobes of the liver. The gallbladder contains sludge in gallstones and is slightly distended but no obvious wall thickening or pericholecystic fluid. Normal caliber and course of the common bile duct. Pancreas:  No mass, inflammation or ductal dilatation. Spleen:  Normal size. No focal lesions.  Adrenals/Urinary Tract: No obvious adrenal gland metastasis. Kidneys are unremarkable. Stable cysts. Stomach/Bowel: Visualized portions within the abdomen are unremarkable. Vascular/Lymphatic: No pathologically enlarged lymph nodes identified. No abdominal aortic aneurysm demonstrated. Other:  Small volume abdominal ascites. Musculoskeletal: Diffuse sclerotic metastatic bone disease. IMPRESSION: 1. Diffuse/extensive metastatic  disease throughout both lobes of the liver. 2. Gallbladder sludge and gallstones but no definite findings for acute cholecystitis. 3. Normal caliber and course of the common bile duct. 4. No abdominal lymphadenopathy. 5. Diffuse sclerotic metastatic bone disease. 6. Small bilateral pleural effusions and overlying atelectasis. Electronically Signed   By: Marijo Sanes M.D.   On: 10/04/2019 05:31   US BIOPSY (LIVER)  Result Date: 09/18/2019 INDICATION: 74 year old with history of breast cancer and multiple new liver lesions. Tissue diagnosis is needed. EXAM: ULTRASOUND-GUIDED LIVER LESION BIOPSY MEDICATIONS: None. ANESTHESIA/SEDATION: Moderate (conscious) sedation was employed during this procedure. A total of Versed 2.0 mg and Fentanyl 100 mcg was administered intravenously. Moderate Sedation Time: 15 minutes. The patient's level of consciousness and vital signs were monitored continuously by radiology nursing throughout the procedure under my direct supervision. FLUOROSCOPY TIME:  None COMPLICATIONS: None immediate. PROCEDURE: Informed written consent was obtained from the patient after a thorough discussion of the procedural risks, benefits and alternatives. All questions were addressed. A timeout was performed prior to the initiation of the procedure. Liver was evaluated with ultrasound. Lesion in the left hepatic lobe was targeted for biopsy. The anterior abdomen was prepped with chlorhexidine and sterile field was created. Maximal barrier sterile technique was utilized including mask,  sterile gowns, sterile gloves, sterile drape, hand hygiene and skin antiseptic. Skin was anesthetized using 1% lidocaine. Small incision was made. Using ultrasound guidance, a 17 gauge coaxial needle was directed into the left hepatic lobe and a hypoechoic lesion. Multiple core biopsies were obtained with an 18 gauge core device. Specimens placed in formalin. 17 gauge needle was removed without complication. Bandage placed over the puncture site. FINDINGS: Numerous hypoechoic lesions scattered throughout the liver. Hypoechoic lesion in left hepatic lobe was targeted for biopsy. Biopsy needle confirmed within the lesion. Adequate core specimens obtained. No immediate bleeding or hematoma formation. IMPRESSION: Ultrasound-guided core biopsy of a left hepatic lesion. Electronically Signed   By: Markus Daft M.D.   On: 09/18/2019 13:19   MR ABDOMEN MRCP W WO CONTAST  Result Date: 10/04/2019 CLINICAL DATA:  Abdominal pain. Metastatic hepatic disease. EXAM: MRI ABDOMEN WITHOUT AND WITH CONTRAST (INCLUDING MRCP) TECHNIQUE: Multiplanar multisequence MR imaging of the abdomen was performed both before and after the administration of intravenous contrast. Heavily T2-weighted images of the biliary and pancreatic ducts were obtained, and three-dimensional MRCP images were rendered by post processing. CONTRAST:  38m GADAVIST GADOBUTROL 1 MMOL/ML IV SOLN COMPARISON:  CT scan 09/30/2019 and 09/28/2019 FINDINGS: Lower chest: The lung bases are grossly clear. Small bilateral pleural effusions and overlying atelectasis. Hepatobiliary: Diffuse/extensive metastatic disease throughout both lobes of the liver. The gallbladder contains sludge in gallstones and is slightly distended but no obvious wall thickening or pericholecystic fluid. Normal caliber and course of the common bile duct. Pancreas:  No mass, inflammation or ductal dilatation. Spleen:  Normal size. No focal lesions. Adrenals/Urinary Tract: No obvious adrenal gland  metastasis. Kidneys are unremarkable. Stable cysts. Stomach/Bowel: Visualized portions within the abdomen are unremarkable. Vascular/Lymphatic: No pathologically enlarged lymph nodes identified. No abdominal aortic aneurysm demonstrated. Other:  Small volume abdominal ascites. Musculoskeletal: Diffuse sclerotic metastatic bone disease. IMPRESSION: 1. Diffuse/extensive metastatic disease throughout both lobes of the liver. 2. Gallbladder sludge and gallstones but no definite findings for acute cholecystitis. 3. Normal caliber and course of the common bile duct. 4. No abdominal lymphadenopathy. 5. Diffuse sclerotic metastatic bone disease. 6. Small bilateral pleural effusions and overlying atelectasis. Electronically Signed   By: PMarijo Sanes  M.D.   On: 10/04/2019 05:31   CT Angio Chest/Abd/Pel for Dissection W and/or Wo Contrast  Result Date: 09/30/2019 CLINICAL DATA:  Abdominal pain, aortic dissection suspected, history of breast cancer EXAM: CT ANGIOGRAPHY CHEST, ABDOMEN AND PELVIS TECHNIQUE: Non-contrast CT of the chest was initially obtained. Multidetector CT imaging through the chest, abdomen and pelvis was performed using the standard protocol during bolus administration of intravenous contrast. Multiplanar reconstructed images and MIPs were obtained and reviewed to evaluate the vascular anatomy. CONTRAST:  54m OMNIPAQUE IOHEXOL 350 MG/ML SOLN COMPARISON:  CT chest, abdomen and pelvis 09/28/2019 FINDINGS: CTA CHEST FINDINGS Cardiovascular: Initial noncontrast CT of the chest reveals an atherosclerotic thoracic aorta without hyperdense mural thickening or plaque displacement to suggest intramural hematoma. Postcontrast administration there is satisfactory, preferential opacification of the thoracic aorta. The aortic root is suboptimally assessed given cardiac pulsation artifact. Calcifications present on the aortic leaflets. Atherosclerotic plaque within the normal caliber aorta. No acute luminal  abnormality of the imaged aorta. No periaortic stranding or hemorrhage. Shared origin of the brachiocephalic and left common carotid arteries. Atherosclerotic plaque in the proximal great vessels without acute luminal abnormality or occlusion. Normal heart size. No pericardial effusion. Three-vessel coronary artery atherosclerosis is noted. Central pulmonary arteries are normal caliber. No acute hyperdense central, lobar or proximal segmental filling defects on this non tailored examination of the pulmonary arteries. Some calcification present in a segmental and subsegmental branches of the medial basal segment right lower lobe may reflect more chronic pulmonary embolic disease, unchanged from numerous priors. Mediastinum/Nodes: No mediastinal fluid or gas. Stable 7 mm hypoattenuating nodule in the right lobe thyroid gland. Not clinically significant and no further evaluation is warranted. No acute abnormality of the trachea. Small sliding-type hiatal hernia. Esophagus otherwise unremarkable. No worrisome mediastinal, hilar or axillary adenopathy. Lungs/Pleura: Some increasing basilar atelectatic changes most pronounced in the right lower lobe with additional bandlike areas of subsegmental atelectasis and/or scarring. No consolidation, features of edema, pneumothorax, or effusion. Stable appearance of a 3 mm nodule in the periphery of the left lower lobe. No other acute or concerning pulmonary nodules or masses. Musculoskeletal: Multiple ill-defined sclerotic foci present throughout the thoracic spine, bilateral ribs, and sternum. Multilevel degenerative changes are present in the imaged portions of the spine. Additional degenerative changes in the bilateral shoulders. Review of the MIP images confirms the above findings. CTA ABDOMEN AND PELVIS FINDINGS VASCULAR Aorta: Atherosclerotic plaque within the normal caliber aorta. No acute luminal abnormality. No aneurysm or ectasia. No periaortic stranding or hemorrhage.  Celiac: Moderate ostial plaque narrowing with a second focus of narrowing in the proximal celiac access demonstrating a hook like configuration on sagittal reformation (9/122) suggesting some compression by the median arcuate ligament with mild poststenotic dilatation of the vessel up to 8 mm in diameter, smoothly tapering to a more normal appearance the otherwise normal branching pattern. SMA: Patent without evidence of aneurysm, dissection, vasculitis or significant stenosis. Renals: Single renal arteries bilaterally. Mild bilateral ostial plaque narrowing. No evidence of aneurysm, dissection, vasculitis or fibromuscular dysplasia. IMA: Mild-to-moderate ostial plaque narrowing. Otherwise normal distal opacification and branching pattern. Inflow: Calcified noncalcified atheromatous plaque throughout the common, internal external iliac branches. No evidence of aneurysm, dissection or vasculitis. Proximal outflow vessels including the common, superficial and profundus femoral arteries with mild plaque but no significant stenosis, occlusion, dissection or features of vasculitis. Veins: No obvious venous abnormality within the limitations of this arterial phase study. Review of the MIP images confirms the above findings. NON-VASCULAR Hepatobiliary:  Innumerable hypoattenuating lesions present throughout the liver including larger confluent lesions spanning segments 4, 5 and 8, largest again measuring up to 8.2 cm in size though poorly characterized compared to the portal venous study is performed recently (09/28/2019, 09/06/2019). Difficult to assess for new or enlarging lesions in this arterial phase exam. Nodular hepatic liver surface contour much of which is attributable to the subcapsular location of many of these lesions. Persistent gallbladder distension with some increasingly conspicuous gallbladder wall thickening towards the fundus and adjacent pericholecystic inflammatory change (5/118). No visible calcified  gallstones. No biliary ductal dilatation. Pancreas: Unremarkable. No pancreatic ductal dilatation or surrounding inflammatory changes. Spleen: Normal in size. No concerning splenic lesions. Adrenals/Urinary Tract: Redemonstration of a 1.2 cm nodule in the body of the right adrenal gland. No discernible left adrenal nodules. Fluid attenuation cyst again seen in the lower pole right kidney measuring up to 3.4 cm. No concerning renal mass, urolithiasis or hydronephrosis. Kidneys enhance symmetrically. Urinary bladder is largely decompressed at the time of exam and therefore poorly evaluated by CT imaging. No gross bladder abnormality. Stomach/Bowel: Small sliding-type hiatal hernia. Stomach is unremarkable. Few air-filled duodenal diverticula. Few nonspecific fluid-filled though nondilated thickened loops of small bowel. No other conspicuous small bowel segments. Cecum is displaced anterior to the liver. A normal appendix is visualized. No colonic dilatation or wall thickening. Scattered colonic diverticula without focal inflammation to suggest diverticulitis. Lymphatic: No suspicious or enlarged lymph nodes in the included lymphatic chains. Reproductive: Multiple calcified uterine fibroids. Nonspecific endometrial thickening to 7 mm, greater than expected for this advanced age female. No concerning adnexal lesions. Other: Small volume of fluid in the abdomen predominantly within the subphrenic spaces, pericolic gutters and deep pelvis. Musculoskeletal: Background of multilevel degenerative changes in the spine hips and pelvis. Review of the MIP images confirms the above findings. IMPRESSION: Vascular 1. No evidence of acute aortic syndrome. 2.  Aortic Atherosclerosis (ICD10-I70.0). 3. Three-vessel coronary artery disease. 4. Moderate ostial plaque narrowing of the celiac, bilateral renal arteries and IMA. 5. Hook-like configuration of the celiac axis may reflect compression by the median arcuate ligament/arcuate  ligament syndrome. Nonvascular 1. Increasingly conspicuous gallbladder wall thickening towards the fundus with some pericholecystic inflammation. Correlate with upper abdominal symptoms and consider right upper quadrant ultrasound for further evaluation. 2. Nonspecific fluid-filled appearance of the distal small bowel some questionable mucosal hyperemia. Correlate for features of enteritis. 3. Innumerable hypoattenuating lesions throughout the liver including larger confluent lesions spanning segments 4, 5 and 8, largest again measuring up to 8.2 cm in size though poorly characterized compared to the portal venous study is performed /2021, 09/06/2019). Difficult to assess for new or enlarging lesions in this arterial phase exam. 4. Developing ascites throughout the abdomen, could reflect hepatic dysfunction secondary to the extensive involvement of the liver versus reactive free fluid, correlate with serologies. 5. Small right adrenal nodule, concerning for metastasis. 6. Multiple ill-defined sclerotic foci throughout the thoracic spine, bilateral ribs, and sternum, concerning for osseous metastatic disease. 7. Nonspecific endometrial thickening to 7 mm, greater than expected for this advanced age female. Recommend further evaluation with nonemergent pelvic ultrasound. 8. Colonic diverticulosis without evidence of diverticulitis. 9. Small sliding-type hiatal hernia. 10. Aortic Atherosclerosis (ICD10-I70.0). Electronically Signed   By: Lovena Le M.D.   On: 09/30/2019 22:26   US ABDOMEN LIMITED RUQ  Result Date: 09/30/2019 CLINICAL DATA:  Right upper quadrant abdominal pain EXAM: ULTRASOUND ABDOMEN LIMITED RIGHT UPPER QUADRANT COMPARISON:  None. FINDINGS: Gallbladder: The gallbladder wall is  thickened measuring approximately 5 mm. The sonographic Percell Miller sign is negative. There is cholelithiasis with gallbladder sludge. Common bile duct: Diameter: 8 mm Liver: Innumerable hepatic masses are noted throughout the  liver. Portal vein is patent on color Doppler imaging with normal direction of blood flow towards the liver. Other: None. IMPRESSION: 1. Cholelithiasis without definite sonographic evidence for acute cholecystitis. 2. Mildly thickened gallbladder wall which is nonspecific but can be seen in patients with underlying hepatocellular disease. 3. Innumerable hepatic masses are again noted, consistent with metastatic disease. Electronically Signed   By: Constance Holster M.D.   On: 09/30/2019 21:23    PERFORMANCE STATUS (ECOG) : 4 - Bedbound  Review of Systems Unless otherwise noted, a complete review of systems is negative.  Physical Exam General: frail appearing Pulmonary: unlabored Extremities: no edema, no joint deformities Skin: no rashes Neurological: Weakness but otherwise nonfocal  IMPRESSION: Patient seemed to tolerate chemotherapy earlier in this week extremely frail and weak. Today, she told me that she has decided to stop chemotherapy and focus on end-of-life/comfort care. She said her husband was in agreement with that decision. We talked about hospice at length. Unfortunately, I do not think that family can care for patient at home given her poor performance status. Patient said that she would be in agreement with transfer to the [facility] for end-of-life care.  I called and spoke with patient's husband who is in route to the hospital. We will meet with him when he arrives to clarify goals and disposition.  PLAN: -Best supportive care -Recommend hospice IPU -Will meet with patient's husband when he arrives   Time Total: 30 minutes  Visit consisted of counseling and education dealing with the complex and emotionally intense issues of symptom management and palliative care in the setting of serious and potentially life-threatening illness.Greater than 50%  of this time was spent counseling and coordinating care related to the above assessment and plan.  Signed by: Altha Harm, PhD, NP-C

## 2019-10-10 NOTE — Progress Notes (Signed)
   10/10/19 1500  Clinical Encounter Type  Visited With Patient  Visit Type Follow-up;Spiritual support;Social support  Referral From Nurse  Consult/Referral To Chaplain  Ch followed-up with Pt to get a will notarized. Palliative care nurse stop me and said the will can get notarized at the hospice house. Ch will follow-up with Pt.

## 2019-10-10 NOTE — Discharge Summary (Signed)
Physician Discharge Summary  Faith [name] [facility] PVV:[id number] DOB: [birth year] DOA: 09/30/2019  PCP: Leonel Ramsay, MD  Admit date: 09/30/2019 Discharge date: 10/10/2019  Admitted From: Home Disposition:  Residential Hospice   Recommendations for Outpatient Follow-up:  1. Follow ups per hospice  Home Health: n/a  Equipment/Devices: no   Discharge Condition: Stable  CODE STATUS: DNR  Diet recommendation: Regular / per patient comfort   Discharge Diagnoses: Active Problems:   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 female, estrogen receptor positive (HCC)   Elevated LFTs   Bone metastasis (HCC)   Liver metastasis (HCC)   HLD (hyperlipidemia)   Acute renal failure superimposed on stage 3a chronic kidney disease (HCC)   Leukocytosis   Palliative care encounter    Summary of HPI and Hospital Course:   HPI on admission: "Faith [name] a73 y.o.femalewith a known history of coronary artery disease, type diabetes mellitus, hypertension, dyslipidemia, [name] She stated that she felt fine when she went home yesterday till this afternoon when she felt weak and dizzy and could not stand or walk. She admitted to associated abdominal pain epigastric and right upper quadrant area. She denied any nausea or vomiting or diarrhea. No fever or chills. No chest pain or dyspnea or cough or wheezing. No dysuria, oliguria, urgency or frequency or flank pain.  Upon presentation to the emergency room, hypertension was 89.51F/31.7C,blood pressure 72/44 with a heart rate of 96 respiratory to 24 and pulse 70 was 98% on room air.With clear bolus of IV normal saline her blood pressure was up to 130/48.   Labs  were remarkable for anion gap of 16, magnesium of 1.9 with a potassium of 3.8 BUN of 17 with a creatinine of 1.54 compared to 21 and 0.97 on 9/11, high-sensitivity troponin I 160 and later 111, AST 358 ALT of 218 and alk phos of 900 with total bili of 2.2 albumin of 3 and total protein of 6.5.The patient's alk phos was 669 on 9/10 with total bili of 2.3, AST is 225 and ALT 177. Lactic acid came back 5.9 and later down to 4.4 from 1.8 on 9/10. CBC showed leukocytosis of 13.2 with neutrophilia as well as thrombocytopenia of 135. WBCs were 10 on 9/11. At 500 mL D-dimer was more than 7500, Tylenol level 10 and salicylate less than 7. TSH was 4.2 and free T4 1.37 both slightly elevated. Blood cultures were drawn. EKG showed sinus rhythm with rate of 98 with occasional PVCs and possible left atrial enlargement.   Right upper quadrant ultrasound revealed the following: 1. Cholelithiasis without definite sonographic evidence for acute cholecystitis. 2. Mildly thickened gallbladder wall which is nonspecific but can be seen in patients with underlying hepatocellular disease. 3. Innumerable hepatic masses are again noted, consistent with metastatic disease.  The patient was given IV cefepime and Flagyl, IV Zofran and 3 L bolus of IV normal saline. She will be admitted to a progressive unit bed for further evaluation and management."     SIRS. Sepsis was ruled out. No source of infection was identified.  Presented with hypotension, elevated lactate, hypothermia and tachypnea. Empirically treated with antibiotics from 9/13-9/16.  Cultures remained negative.   BP and vitals all improved.  Unable to rule out SBP due to  only trace ascites present, would be too high risk, and patient's abdominal exam remained benign.    Left breast cancer with metastases to liver and bone - Oncology and Palliative care were consulted.  Port placed 9/17, weekly chemo began 9/17. 9/22 patient and husband have decided  to forego further chemotherapy or aggressive treatements. Transitioning to comfort care.   Stable for discharge to residential hospice today.  Elevated liver enzymes. Initially, based on imaging there was concern for cholecystitis. Seen by general surgery high-dose felt unlikely that patient had cholecystitis and they have signed off. She is also being seen by GI and it is thought that her elevated liver enzymes likely related to underlying cancer. MRCP w/o signs obstruction/infection. Uptrending LFTs likely consequence of progressing cancer. Trend labs every other day.  Acute kidney injury - POA, likely related to hypotension. Resolved with IV fluids.  Hematoma at port-a-cath site - resolving.  Monitor.     CAD - statin held due to elevated liver enzymes. Resumed home metoprolol.  Hx GCA - resumed prednisone  History of hypertension resumed metoprolol.  Chronic, stable.     Discharge Instructions   Discharge Instructions    Call MD for:   Complete by: As directed    Uncontrolled pain or discomfort   Diet - low sodium heart healthy   Complete by: As directed    Increase activity slowly   Complete by: As directed      Allergies as of 10/10/2019      Reactions   Mushroom Extract Complex Hives, Swelling   Nickel Rash   Shellfish Allergy Nausea And Vomiting      Medication List    STOP taking these medications   alendronate 70 MG tablet Commonly known as: FOSAMAX   aspirin EC 81 MG tablet   atorvastatin 80 MG tablet Commonly known as: LIPITOR   Biotin 5 MG Caps   CoQ10 100 MG Caps   losartan 50 MG tablet Commonly known as: COZAAR   sulfamethoxazole-trimethoprim 800-160 MG tablet Commonly known as: BACTRIM DS     TAKE these medications   acetaminophen 325 MG tablet Commonly known as: TYLENOL Take 2 tablets (650 mg total) by mouth every 6 (six) hours as needed for mild pain (or Fever >/= 101).   Cholecalciferol 125 MCG (5000 UT) Tabs Take 1 capsule by  mouth 2 (two) times daily.   magnesium hydroxide 400 MG/5ML suspension Commonly known as: MILK OF MAGNESIA Take 30 mLs by mouth daily as needed for mild constipation.   metoprolol tartrate 25 MG tablet Commonly known as: LOPRESSOR Take 25 mg by mouth 2 (two) times daily.   ondansetron 8 MG tablet Commonly known as: Zofran Take 1 tablet (8 mg total) by mouth every 8 (eight) hours as needed (Nausea or vomiting).   oxyCODONE 5 MG immediate release tablet Commonly known as: Oxy IR/ROXICODONE Take 1 tablet (5 mg total) by mouth every 6 (six) hours as needed for breakthrough pain.   predniSONE 5 MG tablet Commonly known as: DELTASONE Take 3 tablets (15 mg total) by mouth daily. Start taking on: October 11, 2019 What changed:   medication strength  how much to take   traZODone 50 MG tablet Commonly known as: DESYREL Take 0.5 tablets (25 mg total) by mouth at bedtime as needed for sleep.   vitamin C 1000 MG tablet Take 1,000 mg by mouth daily.       Allergies  Allergen Reactions  . Mushroom Extract Complex Hives and Swelling  .  Nickel Rash  . Shellfish Allergy Nausea And Vomiting    Consultations:  Oncology  Palliative Care    Procedures/Studies: CT CHEST ABDOMEN PELVIS W CONTRAST  Result Date: 09/28/2019 CLINICAL DATA:  Weakness and hypotension.  Metastatic breast cancer. EXAM: CT CHEST, ABDOMEN, AND PELVIS WITH CONTRAST TECHNIQUE: Multidetector CT imaging of the chest, abdomen and pelvis was performed following the standard protocol during bolus administration of intravenous contrast. CONTRAST:  9m OMNIPAQUE IOHEXOL 300 MG/ML  SOLN COMPARISON:  CT abdomen dated September 06, 2019. CT chest dated September 07, 2018. FINDINGS: CT CHEST FINDINGS Cardiovascular: No significant vascular findings. Normal heart size. No pericardial effusion. No thoracic aortic aneurysm. Coronary, aortic arch, and branch vessel atherosclerotic vascular disease. Mediastinum/Nodes: No enlarged  mediastinal, hilar, or axillary lymph nodes. Prior left axillary lymph node dissection. Unchanged 7 mm hypodense nodule in the right thyroid lobe. Not clinically significant; no follow-up imaging recommended. Trachea and esophagus demonstrate no significant findings. Lungs/Pleura: No focal consolidation, pleural effusion, or pneumothorax. New 3 mm nodule in the peripheral left lower lobe (series 4, image 107). No additional pulmonary nodules. Musculoskeletal: Innumerable patchy indistinct small sclerotic foci throughout the thoracic spine, sternum, and ribs, new since August 2020. Prior bilateral mastectomies. CT ABDOMEN PELVIS FINDINGS Hepatobiliary: Innumerable confluent hypoenhancing liver masses scattered throughout the liver are similar to recent CT. New trace perihepatic ascites. No gallstones or gallbladder wall thickening. No biliary dilatation. Pancreas: Unremarkable. No pancreatic ductal dilatation or surrounding inflammatory changes. Spleen: Normal in size without focal abnormality. Adrenals/Urinary Tract: Unchanged 1.2 cm right adrenal nodule. The left adrenal gland is unremarkable. Unchanged 3.9 cm simple cyst in the right kidney. No renal calculi or hydronephrosis. Bladder is unremarkable. Stomach/Bowel: Stomach is within normal limits. Appendix appears normal. No evidence of bowel wall thickening, distention, or inflammatory changes. Moderate colonic diverticulosis. Vascular/Lymphatic: Aortic atherosclerosis. No enlarged abdominal or pelvic lymph nodes. Reproductive: Calcified uterine fibroids.  No adnexal mass. Other: Trace free fluid in the pelvis. No pneumoperitoneum. Small fat containing umbilical hernia. Musculoskeletal: Innumerable patchy indistinct small sclerotic foci scattered throughout the lumbar spine and pelvis are again noted. IMPRESSION: 1. No acute intrathoracic process. No acute intra-abdominal process. 2. Unchanged hepatic and osseous metastases. Unchanged suspected small right  adrenal metastasis. 3. New 3 mm pulmonary nodule in the peripheral left lower lobe, indeterminate. Attention on follow-up. 4. New trace ascites. 5. Aortic Atherosclerosis (ICD10-I70.0). Electronically Signed   By: WTitus DubinM.D.   On: 09/28/2019 12:51   PERIPHERAL VASCULAR CATHETERIZATION  Result Date: 10/05/2019 See op note  MR 3D Recon At Scanner  Result Date: 10/04/2019 CLINICAL DATA:  Abdominal pain. Metastatic hepatic disease. EXAM: MRI ABDOMEN WITHOUT AND WITH CONTRAST (INCLUDING MRCP) TECHNIQUE: Multiplanar multisequence MR imaging of the abdomen was performed both before and after the administration of intravenous contrast. Heavily T2-weighted images of the biliary and pancreatic ducts were obtained, and three-dimensional MRCP images were rendered by post processing. CONTRAST:  9106mGADAVIST GADOBUTROL 1 MMOL/ML IV SOLN COMPARISON:  CT scan 09/30/2019 and 09/28/2019 FINDINGS: Lower chest: The lung bases are grossly clear. Small bilateral pleural effusions and overlying atelectasis. Hepatobiliary: Diffuse/extensive metastatic disease throughout both lobes of the liver. The gallbladder contains sludge in gallstones and is slightly distended but no obvious wall thickening or pericholecystic fluid. Normal caliber and course of the common bile duct. Pancreas:  No mass, inflammation or ductal dilatation. Spleen:  Normal size. No focal lesions. Adrenals/Urinary Tract: No obvious adrenal gland metastasis. Kidneys are unremarkable. Stable cysts. Stomach/Bowel: Visualized portions within  the abdomen are unremarkable. Vascular/Lymphatic: No pathologically enlarged lymph nodes identified. No abdominal aortic aneurysm demonstrated. Other:  Small volume abdominal ascites. Musculoskeletal: Diffuse sclerotic metastatic bone disease. IMPRESSION: 1. Diffuse/extensive metastatic disease throughout both lobes of the liver. 2. Gallbladder sludge and gallstones but no definite findings for acute cholecystitis. 3.  Normal caliber and course of the common bile duct. 4. No abdominal lymphadenopathy. 5. Diffuse sclerotic metastatic bone disease. 6. Small bilateral pleural effusions and overlying atelectasis. Electronically Signed   By: Marijo Sanes M.D.   On: 10/04/2019 05:31   US BIOPSY (LIVER)  Result Date: 09/18/2019 INDICATION: 74 year old with history of breast cancer and multiple new liver lesions. Tissue diagnosis is needed. EXAM: ULTRASOUND-GUIDED LIVER LESION BIOPSY MEDICATIONS: None. ANESTHESIA/SEDATION: Moderate (conscious) sedation was employed during this procedure. A total of Versed 2.0 mg and Fentanyl 100 mcg was administered intravenously. Moderate Sedation Time: 15 minutes. The patient's level of consciousness and vital signs were monitored continuously by radiology nursing throughout the procedure under my direct supervision. FLUOROSCOPY TIME:  None COMPLICATIONS: None immediate. PROCEDURE: Informed written consent was obtained from the patient after a thorough discussion of the procedural risks, benefits and alternatives. All questions were addressed. A timeout was performed prior to the initiation of the procedure. Liver was evaluated with ultrasound. Lesion in the left hepatic lobe was targeted for biopsy. The anterior abdomen was prepped with chlorhexidine and sterile field was created. Maximal barrier sterile technique was utilized including mask, sterile gowns, sterile gloves, sterile drape, hand hygiene and skin antiseptic. Skin was anesthetized using 1% lidocaine. Small incision was made. Using ultrasound guidance, a 17 gauge coaxial needle was directed into the left hepatic lobe and a hypoechoic lesion. Multiple core biopsies were obtained with an 18 gauge core device. Specimens placed in formalin. 17 gauge needle was removed without complication. Bandage placed over the puncture site. FINDINGS: Numerous hypoechoic lesions scattered throughout the liver. Hypoechoic lesion in left hepatic lobe was  targeted for biopsy. Biopsy needle confirmed within the lesion. Adequate core specimens obtained. No immediate bleeding or hematoma formation. IMPRESSION: Ultrasound-guided core biopsy of a left hepatic lesion. Electronically Signed   By: Markus Daft M.D.   On: 09/18/2019 13:19   MR ABDOMEN MRCP W WO CONTAST  Result Date: 10/04/2019 CLINICAL DATA:  Abdominal pain. Metastatic hepatic disease. EXAM: MRI ABDOMEN WITHOUT AND WITH CONTRAST (INCLUDING MRCP) TECHNIQUE: Multiplanar multisequence MR imaging of the abdomen was performed both before and after the administration of intravenous contrast. Heavily T2-weighted images of the biliary and pancreatic ducts were obtained, and three-dimensional MRCP images were rendered by post processing. CONTRAST:  28m GADAVIST GADOBUTROL 1 MMOL/ML IV SOLN COMPARISON:  CT scan 09/30/2019 and 09/28/2019 FINDINGS: Lower chest: The lung bases are grossly clear. Small bilateral pleural effusions and overlying atelectasis. Hepatobiliary: Diffuse/extensive metastatic disease throughout both lobes of the liver. The gallbladder contains sludge in gallstones and is slightly distended but no obvious wall thickening or pericholecystic fluid. Normal caliber and course of the common bile duct. Pancreas:  No mass, inflammation or ductal dilatation. Spleen:  Normal size. No focal lesions. Adrenals/Urinary Tract: No obvious adrenal gland metastasis. Kidneys are unremarkable. Stable cysts. Stomach/Bowel: Visualized portions within the abdomen are unremarkable. Vascular/Lymphatic: No pathologically enlarged lymph nodes identified. No abdominal aortic aneurysm demonstrated. Other:  Small volume abdominal ascites. Musculoskeletal: Diffuse sclerotic metastatic bone disease. IMPRESSION: 1. Diffuse/extensive metastatic disease throughout both lobes of the liver. 2. Gallbladder sludge and gallstones but no definite findings for acute cholecystitis. 3. Normal caliber and  course of the common bile duct. 4.  No abdominal lymphadenopathy. 5. Diffuse sclerotic metastatic bone disease. 6. Small bilateral pleural effusions and overlying atelectasis. Electronically Signed   By: Marijo Sanes M.D.   On: 10/04/2019 05:31   CT Angio Chest/Abd/Pel for Dissection W and/or Wo Contrast  Result Date: 09/30/2019 CLINICAL DATA:  Abdominal pain, aortic dissection suspected, history of breast cancer EXAM: CT ANGIOGRAPHY CHEST, ABDOMEN AND PELVIS TECHNIQUE: Non-contrast CT of the chest was initially obtained. Multidetector CT imaging through the chest, abdomen and pelvis was performed using the standard protocol during bolus administration of intravenous contrast. Multiplanar reconstructed images and MIPs were obtained and reviewed to evaluate the vascular anatomy. CONTRAST:  66m OMNIPAQUE IOHEXOL 350 MG/ML SOLN COMPARISON:  CT chest, abdomen and pelvis 09/28/2019 FINDINGS: CTA CHEST FINDINGS Cardiovascular: Initial noncontrast CT of the chest reveals an atherosclerotic thoracic aorta without hyperdense mural thickening or plaque displacement to suggest intramural hematoma. Postcontrast administration there is satisfactory, preferential opacification of the thoracic aorta. The aortic root is suboptimally assessed given cardiac pulsation artifact. Calcifications present on the aortic leaflets. Atherosclerotic plaque within the normal caliber aorta. No acute luminal abnormality of the imaged aorta. No periaortic stranding or hemorrhage. Shared origin of the brachiocephalic and left common carotid arteries. Atherosclerotic plaque in the proximal great vessels without acute luminal abnormality or occlusion. Normal heart size. No pericardial effusion. Three-vessel coronary artery atherosclerosis is noted. Central pulmonary arteries are normal caliber. No acute hyperdense central, lobar or proximal segmental filling defects on this non tailored examination of the pulmonary arteries. Some calcification present in a segmental and  subsegmental branches of the medial basal segment right lower lobe may reflect more chronic pulmonary embolic disease, unchanged from numerous priors. Mediastinum/Nodes: No mediastinal fluid or gas. Stable 7 mm hypoattenuating nodule in the right lobe thyroid gland. Not clinically significant and no further evaluation is warranted. No acute abnormality of the trachea. Small sliding-type hiatal hernia. Esophagus otherwise unremarkable. No worrisome mediastinal, hilar or axillary adenopathy. Lungs/Pleura: Some increasing basilar atelectatic changes most pronounced in the right lower lobe with additional bandlike areas of subsegmental atelectasis and/or scarring. No consolidation, features of edema, pneumothorax, or effusion. Stable appearance of a 3 mm nodule in the periphery of the left lower lobe. No other acute or concerning pulmonary nodules or masses. Musculoskeletal: Multiple ill-defined sclerotic foci present throughout the thoracic spine, bilateral ribs, and sternum. Multilevel degenerative changes are present in the imaged portions of the spine. Additional degenerative changes in the bilateral shoulders. Review of the MIP images confirms the above findings. CTA ABDOMEN AND PELVIS FINDINGS VASCULAR Aorta: Atherosclerotic plaque within the normal caliber aorta. No acute luminal abnormality. No aneurysm or ectasia. No periaortic stranding or hemorrhage. Celiac: Moderate ostial plaque narrowing with a second focus of narrowing in the proximal celiac access demonstrating a hook like configuration on sagittal reformation (9/122) suggesting some compression by the median arcuate ligament with mild poststenotic dilatation of the vessel up to 8 mm in diameter, smoothly tapering to a more normal appearance the otherwise normal branching pattern. SMA: Patent without evidence of aneurysm, dissection, vasculitis or significant stenosis. Renals: Single renal arteries bilaterally. Mild bilateral ostial plaque narrowing. No  evidence of aneurysm, dissection, vasculitis or fibromuscular dysplasia. IMA: Mild-to-moderate ostial plaque narrowing. Otherwise normal distal opacification and branching pattern. Inflow: Calcified noncalcified atheromatous plaque throughout the common, internal external iliac branches. No evidence of aneurysm, dissection or vasculitis. Proximal outflow vessels including the common, superficial and profundus femoral arteries with mild plaque but  no significant stenosis, occlusion, dissection or features of vasculitis. Veins: No obvious venous abnormality within the limitations of this arterial phase study. Review of the MIP images confirms the above findings. NON-VASCULAR Hepatobiliary: Innumerable hypoattenuating lesions present throughout the liver including larger confluent lesions spanning segments 4, 5 and 8, largest again measuring up to 8.2 cm in size though poorly characterized compared to the portal venous study is performed recently (09/28/2019, 09/06/2019). Difficult to assess for new or enlarging lesions in this arterial phase exam. Nodular hepatic liver surface contour much of which is attributable to the subcapsular location of many of these lesions. Persistent gallbladder distension with some increasingly conspicuous gallbladder wall thickening towards the fundus and adjacent pericholecystic inflammatory change (5/118). No visible calcified gallstones. No biliary ductal dilatation. Pancreas: Unremarkable. No pancreatic ductal dilatation or surrounding inflammatory changes. Spleen: Normal in size. No concerning splenic lesions. Adrenals/Urinary Tract: Redemonstration of a 1.2 cm nodule in the body of the right adrenal gland. No discernible left adrenal nodules. Fluid attenuation cyst again seen in the lower pole right kidney measuring up to 3.4 cm. No concerning renal mass, urolithiasis or hydronephrosis. Kidneys enhance symmetrically. Urinary bladder is largely decompressed at the time of exam and  therefore poorly evaluated by CT imaging. No gross bladder abnormality. Stomach/Bowel: Small sliding-type hiatal hernia. Stomach is unremarkable. Few air-filled duodenal diverticula. Few nonspecific fluid-filled though nondilated thickened loops of small bowel. No other conspicuous small bowel segments. Cecum is displaced anterior to the liver. A normal appendix is visualized. No colonic dilatation or wall thickening. Scattered colonic diverticula without focal inflammation to suggest diverticulitis. Lymphatic: No suspicious or enlarged lymph nodes in the included lymphatic chains. Reproductive: Multiple calcified uterine fibroids. Nonspecific endometrial thickening to 7 mm, greater than expected for this advanced age female. No concerning adnexal lesions. Other: Small volume of fluid in the abdomen predominantly within the subphrenic spaces, pericolic gutters and deep pelvis. Musculoskeletal: Background of multilevel degenerative changes in the spine hips and pelvis. Review of the MIP images confirms the above findings. IMPRESSION: Vascular 1. No evidence of acute aortic syndrome. 2.  Aortic Atherosclerosis (ICD10-I70.0). 3. Three-vessel coronary artery disease. 4. Moderate ostial plaque narrowing of the celiac, bilateral renal arteries and IMA. 5. Hook-like configuration of the celiac axis may reflect compression by the median arcuate ligament/arcuate ligament syndrome. Nonvascular 1. Increasingly conspicuous gallbladder wall thickening towards the fundus with some pericholecystic inflammation. Correlate with upper abdominal symptoms and consider right upper quadrant ultrasound for further evaluation. 2. Nonspecific fluid-filled appearance of the distal small bowel some questionable mucosal hyperemia. Correlate for features of enteritis. 3. Innumerable hypoattenuating lesions throughout the liver including larger confluent lesions spanning segments 4, 5 and 8, largest again measuring up to 8.2 cm in size though  poorly characterized compared to the portal venous study is performed /2021, 09/06/2019). Difficult to assess for new or enlarging lesions in this arterial phase exam. 4. Developing ascites throughout the abdomen, could reflect hepatic dysfunction secondary to the extensive involvement of the liver versus reactive free fluid, correlate with serologies. 5. Small right adrenal nodule, concerning for metastasis. 6. Multiple ill-defined sclerotic foci throughout the thoracic spine, bilateral ribs, and sternum, concerning for osseous metastatic disease. 7. Nonspecific endometrial thickening to 7 mm, greater than expected for this advanced age female. Recommend further evaluation with nonemergent pelvic ultrasound. 8. Colonic diverticulosis without evidence of diverticulitis. 9. Small sliding-type hiatal hernia. 10. Aortic Atherosclerosis (ICD10-I70.0). Electronically Signed   By: Lovena Le M.D.   On: 09/30/2019 22:26  US ABDOMEN LIMITED RUQ  Result Date: 09/30/2019 CLINICAL DATA:  Right upper quadrant abdominal pain EXAM: ULTRASOUND ABDOMEN LIMITED RIGHT UPPER QUADRANT COMPARISON:  None. FINDINGS: Gallbladder: The gallbladder wall is thickened measuring approximately 5 mm. The sonographic Percell Miller sign is negative. There is cholelithiasis with gallbladder sludge. Common bile duct: Diameter: 8 mm Liver: Innumerable hepatic masses are noted throughout the liver. Portal vein is patent on color Doppler imaging with normal direction of blood flow towards the liver. Other: None. IMPRESSION: 1. Cholelithiasis without definite sonographic evidence for acute cholecystitis. 2. Mildly thickened gallbladder wall which is nonspecific but can be seen in patients with underlying hepatocellular disease. 3. Innumerable hepatic masses are again noted, consistent with metastatic disease. Electronically Signed   By: Constance Holster M.D.   On: 09/30/2019 21:23      Port-a-cath placement   Subjective: Patient seen at bedside,  sleeping but awoke easily.  Denies any current pain or discomfort.  Had talk with palliative earlier this AM.  She and husband decided on comfort measures.     Discharge Exam: Vitals:   10/10/19 0809 10/10/19 1138  BP: (!) 128/49 (!) 127/48  Pulse: 77 73  Resp: 18 18  Temp: (!) 97.5 F (36.4 C) 98.2 F (36.8 C)  SpO2: 93% 92%   Vitals:   10/09/19 1945 10/10/19 0513 10/10/19 0809 10/10/19 1138  BP: (!) 142/63 (!) 126/53 (!) 128/49 (!) 127/48  Pulse: 78 78 77 73  Resp: '17 17 18 18  ' Temp: 98 F (36.7 C) 98.2 F (36.8 C) (!) 97.5 F (36.4 C) 98.2 F (36.8 C)  TempSrc: Oral  Oral Oral  SpO2: 95% 92% 93% 92%  Weight:  102.2 kg    Height:        General: Pt is alert, awake, not in acute distress Cardiovascular: RRR, S1/S2 +, no rubs, no gallops, left upper chest port in place Respiratory: CTA bilaterally, no wheezing, no rhonchi Abdominal: Soft, NT, ND, bowel sounds + Extremities: no edema, no cyanosis    The results of significant diagnostics from this hospitalization (including imaging, microbiology, ancillary and laboratory) are listed below for reference.     Microbiology: Recent Results (from the past 240 hour(s))  Blood culture (single)     Status: None   Collection Time: 09/30/19  7:47 PM   Specimen: BLOOD  Result Value Ref Range Status   Specimen Description BLOOD RIGHT [facility]  Final   Special Requests   Final    BOTTLES DRAWN AEROBIC AND ANAEROBIC Blood Culture results may not be optimal due to an excessive volume of blood received in culture bottles   Culture   Final    NO GROWTH 5 DAYS Performed at [facility], [address], [place], [place] [address]    Report Status 10/05/2019 FINAL  Final  Culture, blood (single)     Status: None   Collection Time: 09/30/19  9:44 PM   Specimen: BLOOD  Result Value Ref Range Status   Specimen Description BLOOD RIGHT [facility]  Final   Special Requests   Final    BOTTLES DRAWN AEROBIC AND ANAEROBIC Blood Culture  adequate volume   Culture   Final    NO GROWTH 5 DAYS Performed at [facility], [address], [place], [place] [address]    Report Status 10/05/2019 FINAL  Final  SARS Coronavirus 2 by RT PCR (hospital order, performed in [facility] hospital lab) Nasopharyngeal Nasopharyngeal Swab     Status: None   Collection  Time: 10/01/19 12:38 AM   Specimen: Nasopharyngeal Swab  Result Value Ref Range Status   SARS Coronavirus 2 NEGATIVE NEGATIVE Final    Comment: (NOTE) SARS-CoV-2 target nucleic acids are NOT DETECTED.  The SARS-CoV-2 RNA is generally detectable in upper and lower respiratory specimens during the acute phase of infection. The lowest concentration of SARS-CoV-2 viral copies this assay can detect is 250 copies / mL. A negative result does not preclude SARS-CoV-2 infection and should not be used as the sole basis for treatment or other patient management decisions.  A negative result may occur with improper specimen collection / handling, submission of specimen other than nasopharyngeal swab, presence of viral mutation(s) within the areas targeted by this assay, and inadequate number of viral copies (<250 copies / mL). A negative result must be combined with clinical observations, patient history, and epidemiological information.  Fact Sheet for Patients:   StrictlyIdeas.no  Fact Sheet for Healthcare Providers: BankingDealers.co.za  This test is not yet approved or  cleared by the [place] FDA and has been authorized for detection and/or diagnosis of SARS-CoV-2 by FDA under an Emergency Use Authorization (EUA).  This EUA will remain in effect (meaning this test can be used) for the duration of the COVID-19 declaration under Section 564(b)(1) of the Act, 21 U.S.C. section 360bbb-3(b)(1), unless the authorization is terminated or revoked sooner.  Performed at [facility], [address]  [place], [place] [address]      Labs: BNP (last 3 results) Recent Labs    10/03/19 0406  BNP 258.5*   Basic Metabolic Panel: Recent Labs  Lab 10/05/19 0705 10/06/19 0747 10/07/19 0409 10/08/19 0601 10/10/19 0708  NA 142 141 140 140 143  K 4.1 3.9 3.9 3.9 4.1  CL 114* 112* 110 110 110  CO2 16* 16* 21* 21* 25  GLUCOSE 88 139* 147* 115* 98  BUN 23 25* 28* 26* 22  CREATININE 1.26* 1.13* 0.96 0.73 0.80  CALCIUM 9.0 8.8* 8.6* 8.6* 8.6*   Liver Function Tests: Recent Labs  Lab 10/05/19 0705 10/06/19 0747 10/07/19 0409 10/08/19 0601 10/10/19 0708  AST 311* 333* 434* 458* 301*  ALT 171* 153* 164* 167* 132*  ALKPHOS 754* 939* 980* 1,021* 1,140*  BILITOT 3.3* 3.8* 3.4* 4.3* 4.4*  PROT 5.2* 5.4* 5.2* 4.9* 4.9*  ALBUMIN 2.1* 2.3* 2.1* 2.1* 1.9*   No results for input(s): LIPASE, AMYLASE in the last 168 hours. No results for input(s): AMMONIA in the last 168 hours. CBC: Recent Labs  Lab 10/05/19 0705 10/08/19 0602 10/10/19 0708  WBC 15.7*  16.5* 11.4* 7.1  NEUTROABS 11.6*  --  5.9  HGB 13.6  13.1 12.3 12.3  HCT 40.7  38.4 37.7 37.1  MCV 87.2  84.4 90.2 87.7  PLT 110*  123* 83* 94*   Cardiac Enzymes: No results for input(s): CKTOTAL, CKMB, CKMBINDEX, TROPONINI in the last 168 hours. BNP: Invalid input(s): POCBNP CBG: No results for input(s): GLUCAP in the last 168 hours. D-Dimer No results for input(s): DDIMER in the last 72 hours. Hgb A1c No results for input(s): HGBA1C in the last 72 hours. Lipid Profile No results for input(s): CHOL, HDL, LDLCALC, TRIG, CHOLHDL, LDLDIRECT in the last 72 hours. Thyroid function studies No results for input(s): TSH, T4TOTAL, T3FREE, THYROIDAB in the last 72 hours.  Invalid input(s): FREET3 Anemia work up No results for input(s): VITAMINB12, FOLATE, FERRITIN, TIBC, IRON, RETICCTPCT in the last 72 hours. Urinalysis    Component Value Date/Time   COLORURINE  AMBER (A) 09/30/2019 2144   APPEARANCEUR TURBID (A) 09/30/2019  2144   APPEARANCEUR HAZY 04/12/2013 0851   LABSPEC >1.046 (H) 09/30/2019 2144   LABSPEC 1.025 04/12/2013 0851   PHURINE 5.0 09/30/2019 2144   GLUCOSEU NEGATIVE 09/30/2019 2144   GLUCOSEU NEGATIVE 04/12/2013 0851   HGBUR NEGATIVE 09/30/2019 2144   BILIRUBINUR NEGATIVE 09/30/2019 2144   BILIRUBINUR 3+ 04/12/2013 [place] 09/30/2019 2144   PROTEINUR 100 (A) 09/30/2019 2144   NITRITE NEGATIVE 09/30/2019 2144   LEUKOCYTESUR NEGATIVE 09/30/2019 2144   LEUKOCYTESUR NEGATIVE 04/12/2013 0851   Sepsis Labs Invalid input(s): PROCALCITONIN,  WBC,  LACTICIDVEN Microbiology Recent Results (from the past 240 hour(s))  Blood culture (single)     Status: None   Collection Time: 09/30/19  7:47 PM   Specimen: BLOOD  Result Value Ref Range Status   Specimen Description BLOOD RIGHT [facility]  Final   Special Requests   Final    BOTTLES DRAWN AEROBIC AND ANAEROBIC Blood Culture results may not be optimal due to an excessive volume of blood received in culture bottles   Culture   Final    NO GROWTH 5 DAYS Performed at [facility], [address] [place], [place] [address]    Report Status 10/05/2019 FINAL  Final  Culture, blood (single)     Status: None   Collection Time: 09/30/19  9:44 PM   Specimen: BLOOD  Result Value Ref Range Status   Specimen Description BLOOD RIGHT [facility]  Final   Special Requests   Final    BOTTLES DRAWN AEROBIC AND ANAEROBIC Blood Culture adequate volume   Culture   Final    NO GROWTH 5 DAYS Performed at [facility], [address], [place], [place] [address]    Report Status 10/05/2019 FINAL  Final  SARS Coronavirus 2 by RT PCR (hospital order, performed in [facility] hospital lab) Nasopharyngeal Nasopharyngeal Swab     Status: None   Collection Time: 10/01/19 12:38 AM   Specimen: Nasopharyngeal Swab  Result Value Ref Range Status   SARS Coronavirus 2 NEGATIVE NEGATIVE Final    Comment: (NOTE) SARS-CoV-2 target nucleic acids are NOT  DETECTED.  The SARS-CoV-2 RNA is generally detectable in upper and lower respiratory specimens during the acute phase of infection. The lowest concentration of SARS-CoV-2 viral copies this assay can detect is 250 copies / mL. A negative result does not preclude SARS-CoV-2 infection and should not be used as the sole basis for treatment or other patient management decisions.  A negative result may occur with improper specimen collection / handling, submission of specimen other than nasopharyngeal swab, presence of viral mutation(s) within the areas targeted by this assay, and inadequate number of viral copies (<250 copies / mL). A negative result must be combined with clinical observations, patient history, and epidemiological information.  Fact Sheet for Patients:   StrictlyIdeas.no  Fact Sheet for Healthcare Providers: BankingDealers.co.za  This test is not yet approved or  cleared by the [place] FDA and has been authorized for detection and/or diagnosis of SARS-CoV-2 by FDA under an Emergency Use Authorization (EUA).  This EUA will remain in effect (meaning this test can be used) for the duration of the COVID-19 declaration under Section 564(b)(1) of the Act, 21 U.S.C. section 360bbb-3(b)(1), unless the authorization is terminated or revoked sooner.  Performed at [facility], [address], [place],  [address]      Time coordinating discharge: Over 30 minutes  SIGNED:  Ezekiel Slocumb, DO [facility] 10/10/2019, 1:52 PM   If 7PM-7AM, please contact night-coverage [email or web address]

## 2020-11-14 ENCOUNTER — Encounter: Payer: Self-pay | Admitting: Hematology and Oncology

## 2021-01-27 ENCOUNTER — Encounter: Payer: Self-pay | Admitting: Hematology and Oncology

## 2021-02-14 IMAGING — MR MR ABDOMEN WO/W CM MRCP
18 of 22 series · 43 of 48 positions shown · IV contrast (gadavist)
Comparison: CT scan 09/30/2019 and 09/28/2019

CLINICAL DATA: Abdominal pain. Metastatic hepatic disease.

EXAM:
MRI ABDOMEN WITHOUT AND WITH CONTRAST (INCLUDING MRCP)
TECHNIQUE: Multiplanar multisequence MR imaging of the abdomen was performed
both before and after the administration of intravenous contrast.
Heavily T2-weighted images of the biliary and pancreatic ducts were
obtained, and three-dimensional MRCP images were rendered by post
processing.
CONTRAST:  9mL GADAVIST GADOBUTROL 1 MMOL/ML IV SOLN

[Series 3: T2 · coronal · 6.0mm · 1.19mm/px · 1 of 36 slices shown (1 of 2)]
[im 1/36]
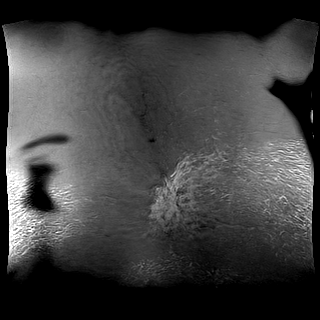

[Series 4: T2 · axial · 6.0mm · 1.19mm/px · 1 of 45 slices shown (2 of 2)]
[im 1/45]
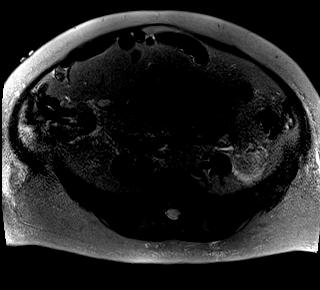

[Series 9: T2 fat-sat · axial · 6.0mm · 1.19mm/px · 1 of 46 slices shown]
[im 1/46]
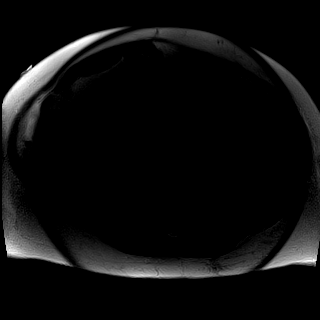

[Series 10: ax dwi_tracew · axial · 6.0mm · 1.42mm/px · z∈[-187,+137]mm · 4 of 138 slices shown]
[im 1/138]
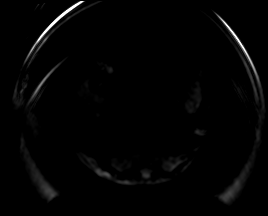
[im 46/138]
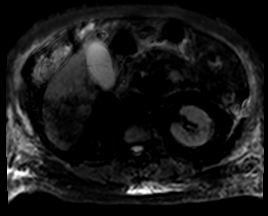
[im 92/138]
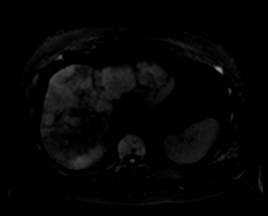
[im 138/138]
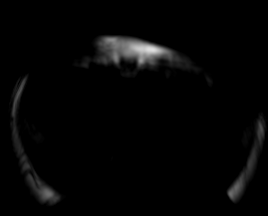

[Series 11: ax dwi_adc · axial · 6.0mm · 1.42mm/px · 1 of 46 slices shown]
[im 1/46]
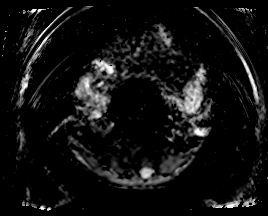

[Series 15: ax in & · axial · 4.5mm · 1.19mm/px · z∈[-174,+110]mm · 4 of 128 slices shown]
[im 1/128]
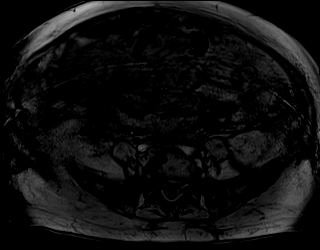
[im 43/128]
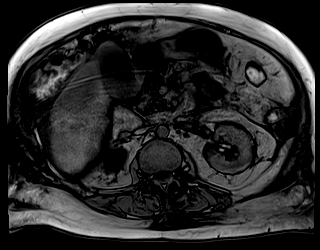
[im 85/128]
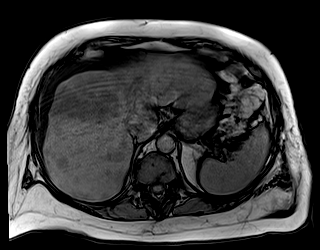
[im 128/128]
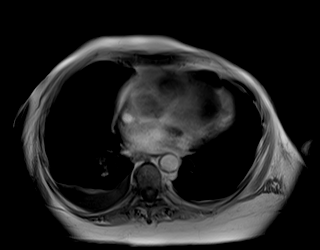

[Series 16: MRCP · coronal · 3.0mm · 1.12mm/px · 1 of 25 slices shown]
[im 1/25]
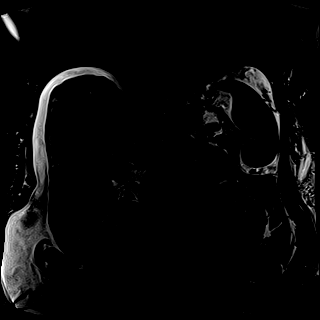

[Series 17: radials · coronal · 50.0mm · 0.78mm/px · 1 of 5 slices shown]
[im 1/5]
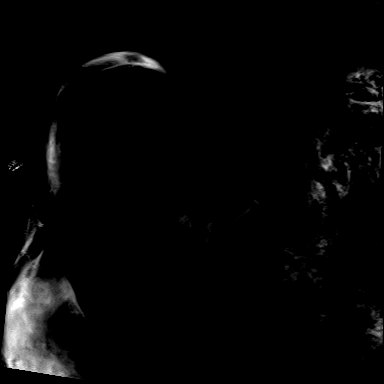

[Series 18: T1 dynamic fat-sat · axial · non-contrast · 4.5mm · 1.19mm/px · z∈[-192,+128]mm · 2 of 72 slices shown (1 of 5)]
[im 1/72]
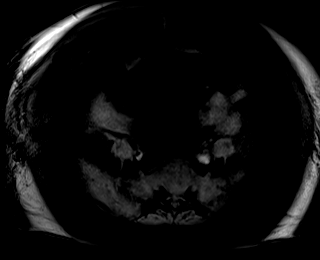
[im 72/72]
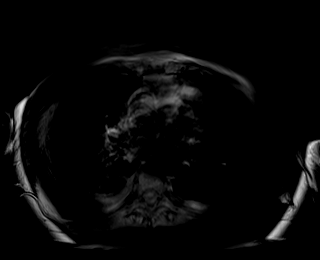

[Series 19: T1 dynamic fat-sat post-contrast · axial · 4.5mm · 1.19mm/px · z∈[-192,+128]mm · 3 of 72 slices shown (1 of 4)]
[im 1/72]
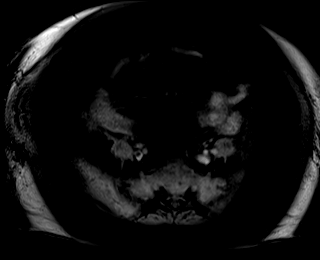
[im 36/72]
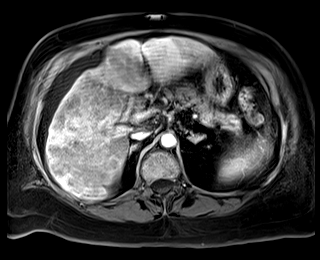
[im 72/72]
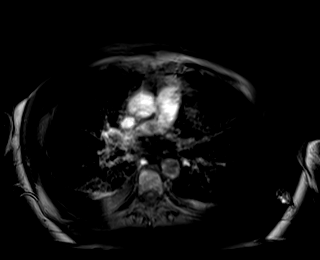

[Series 20: T1 dynamic fat-sat · axial · 4.5mm · 1.19mm/px · z∈[-192,+128]mm · 3 of 72 slices shown (2 of 5)]
[im 1/72]
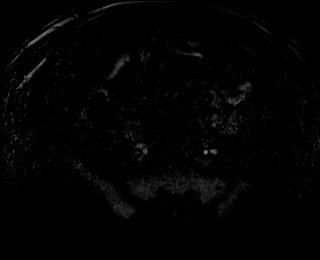
[im 36/72]
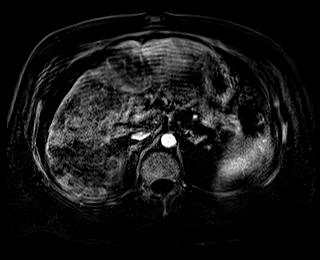
[im 72/72]
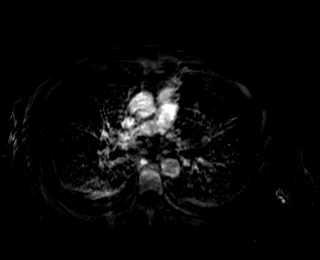

[Series 21: T1 dynamic fat-sat post-contrast · axial · 4.5mm · 1.19mm/px · z∈[-192,+128]mm · 3 of 72 slices shown (2 of 4)]
[im 1/72]
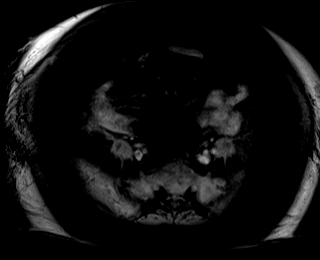
[im 36/72]
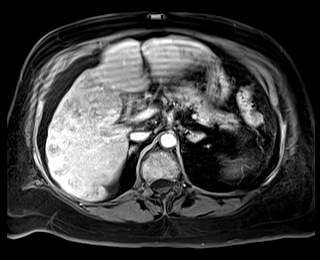
[im 72/72]
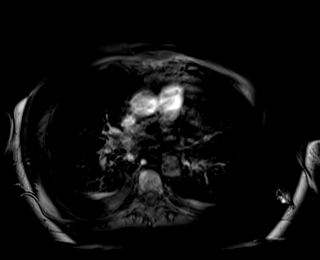

[Series 22: T1 dynamic fat-sat · axial · 4.5mm · 1.19mm/px · z∈[-192,+128]mm · 3 of 72 slices shown (3 of 5)]
[im 1/72]
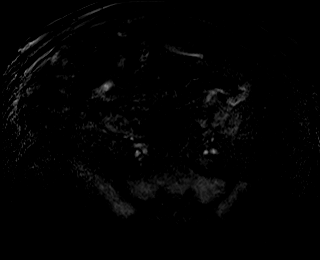
[im 36/72]
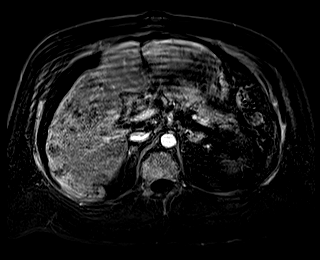
[im 72/72]
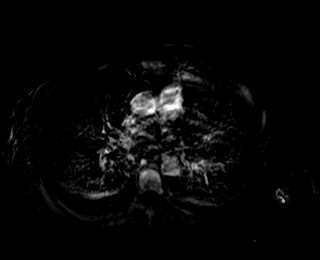

[Series 23: T1 dynamic fat-sat post-contrast · axial · 4.5mm · 1.19mm/px · z∈[-192,+128]mm · 3 of 72 slices shown (3 of 4)]
[im 1/72]
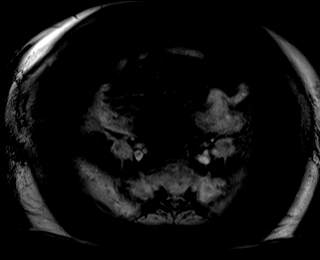
[im 36/72]
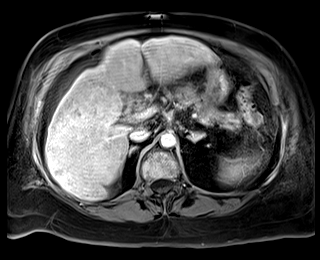
[im 72/72]
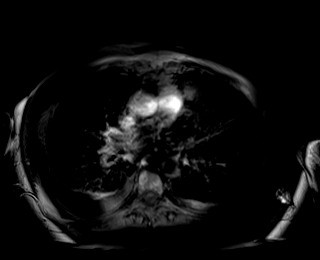

[Series 24: T1 dynamic fat-sat · axial · 4.5mm · 1.19mm/px · z∈[-192,+128]mm · 3 of 72 slices shown (4 of 5)]
[im 1/72]
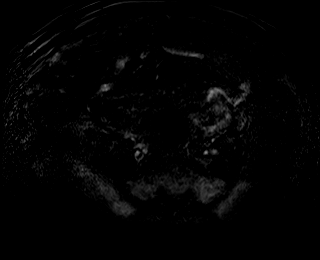
[im 36/72]
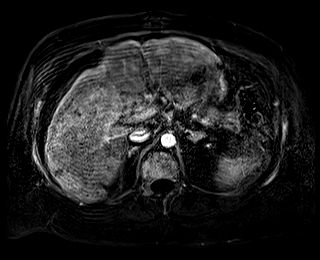
[im 72/72]
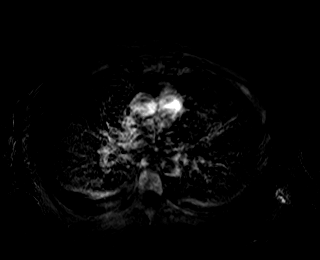

[Series 25: T1 dynamic post-contrast · coronal · 3.5mm · 1.31mm/px · 3 of 72 slices shown]
[im 1/72]
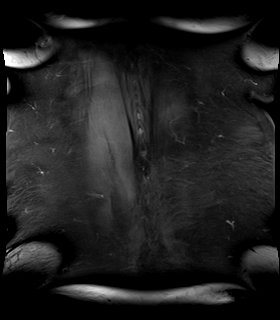
[im 36/72]
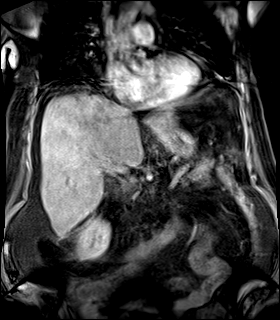
[im 72/72]
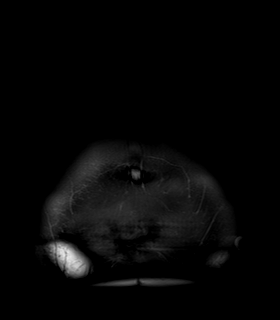

[Series 26: T1 dynamic fat-sat post-contrast · axial · 4.5mm · 1.19mm/px · z∈[-192,+128]mm · 3 of 72 slices shown (4 of 4)]
[im 1/72]
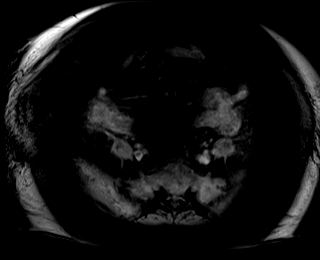
[im 36/72]
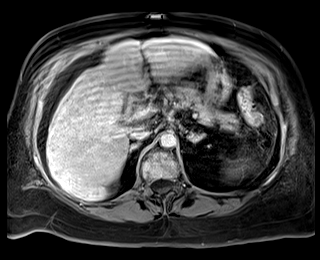
[im 72/72]
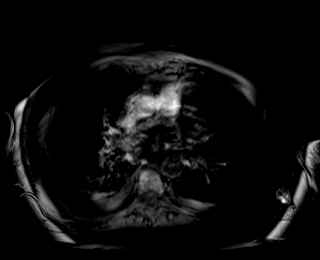

[Series 27: T1 dynamic fat-sat · axial · 4.5mm · 1.19mm/px · z∈[-192,+128]mm · 3 of 72 slices shown (5 of 5)]
[im 1/72]
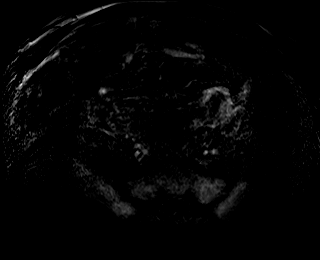
[im 36/72]
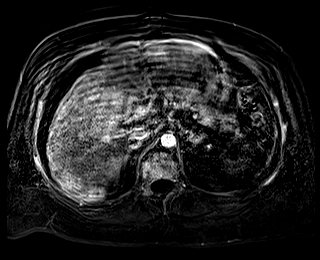
[im 72/72]
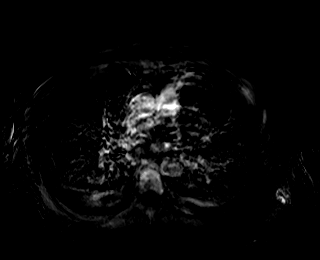

[43 of 48 positions shown; findings below may reference images not displayed]

FINDINGS: Lower chest: The lung bases are grossly clear. Small bilateral
pleural effusions and overlying atelectasis.

Hepatobiliary: Diffuse/extensive metastatic disease throughout both
lobes of the liver. The gallbladder contains sludge in gallstones
and is slightly distended but no obvious wall thickening or
pericholecystic fluid. Normal caliber and course of the common bile
duct.

Pancreas:  No mass, inflammation or ductal dilatation.

Spleen:  Normal size. No focal lesions.

Adrenals/Urinary Tract: No obvious adrenal gland metastasis. Kidneys
are unremarkable. Stable cysts.

Stomach/Bowel: Visualized portions within the abdomen are
unremarkable.

Vascular/Lymphatic: No pathologically enlarged lymph nodes
identified. No abdominal aortic aneurysm demonstrated.

Other:  Small volume abdominal ascites.

Musculoskeletal: Diffuse sclerotic metastatic bone disease.
IMPRESSION: 1. Diffuse/extensive metastatic disease throughout both lobes of the
liver.
2. Gallbladder sludge and gallstones but no definite findings for
acute cholecystitis.
3. Normal caliber and course of the common bile duct.
4. No abdominal lymphadenopathy.
5. Diffuse sclerotic metastatic bone disease.
6. Small bilateral pleural effusions and overlying atelectasis.

## 2021-04-19 ENCOUNTER — Encounter: Payer: Self-pay | Admitting: Hematology and Oncology

## 2021-11-16 ENCOUNTER — Encounter (INDEPENDENT_AMBULATORY_CARE_PROVIDER_SITE_OTHER): Payer: Self-pay
